# Patient Record
Sex: Male | Born: 1945 | Race: White | Hispanic: No | Smoking: Never smoker
Health system: Southern US, Community
[De-identification: ages and names within clinical notes are randomized; demographics above are authoritative.]

## PROBLEM LIST (undated history)

## (undated) DIAGNOSIS — R600 Localized edema: Secondary | ICD-10-CM

## (undated) DIAGNOSIS — E785 Hyperlipidemia, unspecified: Secondary | ICD-10-CM

## (undated) DIAGNOSIS — G4700 Insomnia, unspecified: Secondary | ICD-10-CM

## (undated) DIAGNOSIS — N1831 Chronic kidney disease, stage 3a: Secondary | ICD-10-CM

## (undated) DIAGNOSIS — I10 Essential (primary) hypertension: Secondary | ICD-10-CM

## (undated) DIAGNOSIS — N289 Disorder of kidney and ureter, unspecified: Secondary | ICD-10-CM

## (undated) DIAGNOSIS — I700 Atherosclerosis of aorta: Secondary | ICD-10-CM

## (undated) DIAGNOSIS — H269 Unspecified cataract: Secondary | ICD-10-CM

## (undated) DIAGNOSIS — D649 Anemia, unspecified: Secondary | ICD-10-CM

## (undated) DIAGNOSIS — I2510 Atherosclerotic heart disease of native coronary artery without angina pectoris: Secondary | ICD-10-CM

## (undated) DIAGNOSIS — T7840XA Allergy, unspecified, initial encounter: Secondary | ICD-10-CM

## (undated) DIAGNOSIS — G4730 Sleep apnea, unspecified: Secondary | ICD-10-CM

## (undated) DIAGNOSIS — G4733 Obstructive sleep apnea (adult) (pediatric): Secondary | ICD-10-CM

## (undated) DIAGNOSIS — Z87442 Personal history of urinary calculi: Secondary | ICD-10-CM

## (undated) DIAGNOSIS — T753XXA Motion sickness, initial encounter: Secondary | ICD-10-CM

## (undated) DIAGNOSIS — K219 Gastro-esophageal reflux disease without esophagitis: Secondary | ICD-10-CM

## (undated) DIAGNOSIS — Z9841 Cataract extraction status, right eye: Secondary | ICD-10-CM

## (undated) DIAGNOSIS — J986 Disorders of diaphragm: Secondary | ICD-10-CM

## (undated) DIAGNOSIS — J189 Pneumonia, unspecified organism: Secondary | ICD-10-CM

## (undated) DIAGNOSIS — G720 Drug-induced myopathy: Secondary | ICD-10-CM

## (undated) DIAGNOSIS — K2270 Barrett's esophagus without dysplasia: Secondary | ICD-10-CM

## (undated) DIAGNOSIS — Z7982 Long term (current) use of aspirin: Secondary | ICD-10-CM

## (undated) DIAGNOSIS — T466X5A Adverse effect of antihyperlipidemic and antiarteriosclerotic drugs, initial encounter: Secondary | ICD-10-CM

## (undated) DIAGNOSIS — I5189 Other ill-defined heart diseases: Secondary | ICD-10-CM

## (undated) DIAGNOSIS — N400 Enlarged prostate without lower urinary tract symptoms: Secondary | ICD-10-CM

## (undated) HISTORY — PX: LITHOTRIPSY: SUR834

## (undated) HISTORY — PX: CATARACT EXTRACTION W/ INTRAOCULAR LENS IMPLANT: SHX1309

## (undated) HISTORY — PX: OTHER SURGICAL HISTORY: SHX169

## (undated) HISTORY — PX: ESOPHAGOGASTRODUODENOSCOPY: SHX1529

## (undated) HISTORY — DX: Allergy, unspecified, initial encounter: T78.40XA

## (undated) HISTORY — DX: Essential (primary) hypertension: I10

## (undated) HISTORY — DX: Gastro-esophageal reflux disease without esophagitis: K21.9

## (undated) HISTORY — PX: CORONARY ARTERY BYPASS GRAFT: SHX141

## (undated) HISTORY — PX: HERNIA REPAIR: SHX51

## (undated) HISTORY — DX: Hyperlipidemia, unspecified: E78.5

## (undated) HISTORY — PX: UMBILICAL HERNIA REPAIR: SHX196

## (undated) HISTORY — PX: EYE SURGERY: SHX253

---

## 2005-07-04 ENCOUNTER — Emergency Department: Payer: Self-pay | Admitting: Emergency Medicine

## 2005-07-10 ENCOUNTER — Emergency Department: Payer: Self-pay | Admitting: Emergency Medicine

## 2005-07-14 ENCOUNTER — Emergency Department: Payer: Self-pay | Admitting: Unknown Physician Specialty

## 2007-08-31 ENCOUNTER — Ambulatory Visit: Payer: Self-pay | Admitting: Unknown Physician Specialty

## 2007-08-31 HISTORY — PX: COLONOSCOPY: SHX174

## 2009-10-11 ENCOUNTER — Ambulatory Visit: Payer: Self-pay | Admitting: Family Medicine

## 2011-11-24 HISTORY — PX: COLONOSCOPY: SHX174

## 2011-11-24 HISTORY — PX: UPPER GI ENDOSCOPY: SHX6162

## 2012-01-01 ENCOUNTER — Ambulatory Visit: Payer: Self-pay | Admitting: Ophthalmology

## 2012-01-01 LAB — CBC WITH DIFFERENTIAL/PLATELET
Basophil #: 0.1 10*3/uL (ref 0.0–0.1)
Eosinophil #: 0.2 10*3/uL (ref 0.0–0.7)
Lymphocyte %: 27.9 %
MCH: 31.4 pg (ref 26.0–34.0)
MCHC: 33.5 g/dL (ref 32.0–36.0)
MCV: 94 fL (ref 80–100)
Neutrophil #: 4.7 10*3/uL (ref 1.4–6.5)
Neutrophil %: 61.7 %
Platelet: 259 10*3/uL (ref 150–440)
RBC: 4.93 10*6/uL (ref 4.40–5.90)
RDW: 12.4 % (ref 11.5–14.5)

## 2012-01-01 LAB — POTASSIUM: Potassium: 4 mmol/L (ref 3.5–5.1)

## 2012-01-11 ENCOUNTER — Ambulatory Visit: Payer: Self-pay | Admitting: Ophthalmology

## 2012-07-18 ENCOUNTER — Ambulatory Visit: Payer: Self-pay | Admitting: Family Medicine

## 2013-01-10 ENCOUNTER — Ambulatory Visit: Payer: Self-pay | Admitting: Unknown Physician Specialty

## 2013-01-11 LAB — PATHOLOGY REPORT

## 2014-04-17 ENCOUNTER — Ambulatory Visit: Payer: Self-pay | Admitting: Urology

## 2014-04-24 ENCOUNTER — Ambulatory Visit: Payer: Self-pay | Admitting: Urology

## 2014-04-26 ENCOUNTER — Ambulatory Visit: Payer: Self-pay | Admitting: Urology

## 2014-05-14 ENCOUNTER — Ambulatory Visit: Payer: Self-pay | Admitting: Urology

## 2014-05-15 DIAGNOSIS — N200 Calculus of kidney: Secondary | ICD-10-CM | POA: Insufficient documentation

## 2015-03-17 NOTE — Op Note (Signed)
PATIENT NAME:  Timothy Hatfield, Timothy Hatfield MR#:  [id number] DATE OF BIRTH:  1946-07-30  DATE OF PROCEDURE:  01/11/2012  PREOPERATIVE DIAGNOSIS:  Cataract, right eye.   POSTOPERATIVE DIAGNOSIS:  Cataract, right eye.  PROCEDURE PERFORMED:  Extracapsular cataract extraction using phacoemulsification with placement of an Alcon [id number], 19.5-diopter posterior chamber lens, serial # [id number].  SURGEON:  Loura Back. Raheel Kunkle, MD  ASSISTANT:  None.  ANESTHESIA:  4% lidocaine and 0.75% Marcaine in a 50/50 mixture with 10 units/mL of Hylenex added, given as a peribulbar.   ANESTHESIOLOGIST:  Dr. Marcello Moores  COMPLICATIONS:  None.  ESTIMATED BLOOD LOSS:  Less than 1 ml.  DESCRIPTION OF PROCEDURE:  The patient was brought to the operating room and given a peribulbar block.  The patient was then prepped and draped in the usual fashion.  The vertical rectus muscles were imbricated using 5-0 silk sutures.  These sutures were then clamped to the sterile drapes as bridle sutures.  A limbal peritomy was performed extending two clock hours and hemostasis was obtained with cautery.  A partial thickness scleral groove was made at the surgical limbus and dissected anteriorly in a lamellar dissection using an Alcon crescent knife.  The anterior chamber was entered superonasally with a Superblade and through the lamellar dissection with a 2.6 mm keratome.  DisCoVisc was used to replace the aqueous and a continuous tear capsulorrhexis was carried out.  Hydrodissection and hydrodelineation were carried out with balanced salt and a 27 gauge canula.  The nucleus was rotated to confirm the effectiveness of the hydrodissection.  Phacoemulsification was carried out using a divide-and-conquer technique.  Total ultrasound time was 1 minute and 36 seconds with an average power of 16.6 percent.  Irrigation/aspiration was used to remove the residual cortex.  DisCoVisc was used to inflate the capsule and the internal incision was  enlarged to 3 mm with the crescent knife.  The intraocular lens was folded and inserted into the capsular bag using the AcrySert delivery system.  Irrigation/aspiration was used to remove the residual DisCoVisc.  Miostat was injected into the anterior chamber through the paracentesis track to inflate the anterior chamber and induce miosis.  The wound was checked for leaks and wound leakage was found.  A single 10-0 suture was placed across the incision, tied and the knot was rotated superiorly.  The conjunctiva was closed with cautery and the bridle sutures were removed.  Two drops of 0.3% Vigamox were placed on the eye.   An eye shield was placed on the eye.  The patient was discharged to the recovery room in good condition. ____________________________ Loura Back Michalla Ringer, MD sad:slb D: 01/11/2012 13:36:23 ET T: 01/11/2012 14:07:57 ET JOB#: [id number]  cc: Remo Lipps A. Chaynce Schafer, MD, <Dictator> Martie Lee MD ELECTRONICALLY SIGNED 01/15/2012 16:24

## 2015-05-08 ENCOUNTER — Ambulatory Visit: Payer: Self-pay | Admitting: Family Medicine

## 2015-05-09 ENCOUNTER — Ambulatory Visit
Admission: RE | Admit: 2015-05-09 | Discharge: 2015-05-09 | Disposition: A | Discharge status: Home / Self Care | Payer: Medicare PPO | Source: Ambulatory Visit | Attending: Family Medicine | Admitting: Family Medicine

## 2015-05-09 ENCOUNTER — Encounter: Payer: Self-pay | Admitting: Family Medicine

## 2015-05-09 ENCOUNTER — Ambulatory Visit (INDEPENDENT_AMBULATORY_CARE_PROVIDER_SITE_OTHER): Payer: Medicare PPO | Admitting: Family Medicine

## 2015-05-09 VITALS — BP 130/70 | HR 72 | Ht 67.0 in | Wt 196.0 lb

## 2015-05-09 DIAGNOSIS — M47817 Spondylosis without myelopathy or radiculopathy, lumbosacral region: Secondary | ICD-10-CM | POA: Diagnosis not present

## 2015-05-09 DIAGNOSIS — M545 Low back pain: Secondary | ICD-10-CM | POA: Diagnosis not present

## 2015-05-09 DIAGNOSIS — N200 Calculus of kidney: Secondary | ICD-10-CM | POA: Insufficient documentation

## 2015-05-09 DIAGNOSIS — M5136 Other intervertebral disc degeneration, lumbar region: Secondary | ICD-10-CM | POA: Diagnosis not present

## 2015-05-09 DIAGNOSIS — M47816 Spondylosis without myelopathy or radiculopathy, lumbar region: Secondary | ICD-10-CM | POA: Diagnosis not present

## 2015-05-09 DIAGNOSIS — K219 Gastro-esophageal reflux disease without esophagitis: Secondary | ICD-10-CM

## 2015-05-09 DIAGNOSIS — M5137 Other intervertebral disc degeneration, lumbosacral region: Secondary | ICD-10-CM | POA: Diagnosis not present

## 2015-05-09 MED ORDER — ETODOLAC 500 MG PO TABS: 500.0000 mg | ORAL_TABLET | Freq: Two times a day (BID) | ORAL | Status: DC

## 2015-05-09 MED ORDER — PANTOPRAZOLE SODIUM 40 MG PO TBEC: 40.0000 mg | DELAYED_RELEASE_TABLET | Freq: Every day | ORAL | Status: DC

## 2015-05-09 MED ORDER — BENAZEPRIL-HYDROCHLOROTHIAZIDE 10-12.5 MG PO TABS: 1.0000 | ORAL_TABLET | Freq: Every day | ORAL | Status: DC

## 2015-05-09 NOTE — Progress Notes (Signed)
Name: Timothy Hatfield   MRN: [id number]    DOB: 1946/11/08   Date:05/09/2015       Progress Note  Subjective  Chief Complaint  Chief Complaint  Patient presents with  . Back Pain    chronic- going to chiropractor- no relief now    Back Pain This is a recurrent problem. The current episode started more than 1 year ago (twisted while fishing). The problem occurs daily. The pain is present in the gluteal. The quality of the pain is described as aching. The pain is at a severity of 5/10. The pain is mild. The symptoms are aggravated by bending and sitting. Stiffness is present in the morning. Pertinent negatives include no abdominal pain, chest pain, dysuria, fever, headaches, tingling or weight loss. He has tried NSAIDs for the symptoms. The treatment provided mild relief.    No problem-specific assessment & plan notes found for this encounter.   Past Medical History  Diagnosis Date  . Hypertension   . Hyperlipidemia   . Allergy     Past Surgical History  Procedure Laterality Date  . Hernia repair    . Kidney stones      lithotripsy    History reviewed. No pertinent family history.  History   Social History  . Marital Status: Married    Spouse Name: N/A  . Number of Children: N/A  . Years of Education: N/A   Occupational History  . Not on file.   Social History Main Topics  . Smoking status: Never Smoker   . Smokeless tobacco: Not on file  . Alcohol Use: No  . Drug Use: No  . Sexual Activity: Not on file   Other Topics Concern  . Not on file   Social History Narrative  . No narrative on file    No Known Allergies   Review of Systems  Constitutional: Negative for fever, chills, weight loss and malaise/fatigue.  HENT: Negative for ear discharge, ear pain and sore throat.   Eyes: Negative for blurred vision.  Respiratory: Negative for cough, sputum production, shortness of breath and wheezing.   Cardiovascular: Negative for chest pain, palpitations and  leg swelling.  Gastrointestinal: Negative for heartburn, nausea, abdominal pain, diarrhea, constipation, blood in stool and melena.  Genitourinary: Negative for dysuria, urgency, frequency and hematuria.  Musculoskeletal: Negative for myalgias, back pain, joint pain and neck pain.  Skin: Negative for rash.  Neurological: Negative for dizziness, tingling, sensory change, focal weakness and headaches.  Endo/Heme/Allergies: Negative for environmental allergies and polydipsia. Does not bruise/bleed easily.  Psychiatric/Behavioral: Negative for depression and suicidal ideas. The patient is not nervous/anxious and does not have insomnia.      Objective  Filed Vitals:   05/09/15 0933  BP: 130/70  Pulse: 72  Height: 5\' 7"  (1.702 m)  Weight: 196 lb (88.905 kg)    Physical Exam  Constitutional: He is oriented to person, place, and time and well-developed, well-nourished, and in no distress.  HENT:  Head: Normocephalic.  Right Ear: External ear normal.  Left Ear: External ear normal.  Nose: Nose normal.  Mouth/Throat: Oropharynx is clear and moist.  Eyes: Conjunctivae and EOM are normal. Pupils are equal, round, and reactive to light. Right eye exhibits no discharge. Left eye exhibits no discharge. No scleral icterus.  Neck: Normal range of motion. Neck supple. No JVD present. No tracheal deviation present. No thyromegaly present.  Cardiovascular: Normal rate, regular rhythm, normal heart sounds and intact distal pulses.  Exam reveals no gallop  and no friction rub.   No murmur heard. Pulmonary/Chest: Breath sounds normal. No respiratory distress. He has no wheezes. He has no rales.  Abdominal: Soft. Bowel sounds are normal. He exhibits no mass. There is no hepatosplenomegaly. There is no tenderness. There is no rebound, no guarding and no CVA tenderness.  Musculoskeletal: Normal range of motion. He exhibits no edema or tenderness.  Lymphadenopathy:    He has no cervical adenopathy.   Neurological: He is alert and oriented to person, place, and time. He has normal sensation, normal strength, normal reflexes and intact cranial nerves. No cranial nerve deficit.  Skin: Skin is warm. No rash noted.  Psychiatric: Mood and affect normal.      No results found for this or any previous visit (from the past 2160 hour(s)).   Assessment & Plan  Problem List Items Addressed This Visit    None    Visit Diagnoses    Left low back pain, with sciatica presence unspecified    -  Primary    Relevant Medications    etodolac (LODINE) 500 MG tablet    Other Relevant Orders    Ambulatory referral to Orthopedic Surgery    DG Lumbar Spine Complete    GERD without esophagitis        Relevant Medications    pantoprazole (PROTONIX) 40 MG tablet         Dr. Deanna Jones [facility]  05/09/2015

## 2015-05-09 NOTE — Progress Notes (Addendum)
Name: Timothy Hatfield   MRN: [id number]    DOB: [birth year]   Date:05/09/2015       Progress Note  Subjective  Chief Complaint  Chief Complaint  Patient presents with  . Back Pain    chronic- going to chiropractor- no relief now    Back Pain This is a recurrent problem. The current episode started more than 1 year ago. The problem occurs daily. The problem is unchanged. The pain is present in the lumbar spine and sacro-iliac. The quality of the pain is described as aching. The pain is at a severity of 5/10. The pain is moderate. The pain is worse during the day (esp in AM). The symptoms are aggravated by bending, twisting and sitting. Stiffness is present in the morning. Pertinent negatives include no abdominal pain, bladder incontinence, bowel incontinence, headaches, leg pain, numbness, paresis, paresthesias, perianal numbness, tingling or weakness. He has tried NSAIDs for the symptoms. The treatment provided mild relief.    No problem-specific assessment & plan notes found for this encounter.   Past Medical History  Diagnosis Date  . Hypertension   . Hyperlipidemia   . Allergy     Past Surgical History  Procedure Laterality Date  . Hernia repair    . Kidney stones      lithotripsy    History reviewed. No pertinent family history.  History   Social History  . Marital Status: Married    Spouse Name: N/A  . Number of Children: N/A  . Years of Education: N/A   Occupational History  . Not on file.   Social History Main Topics  . Smoking status: Never Smoker   . Smokeless tobacco: Not on file  . Alcohol Use: No  . Drug Use: No  . Sexual Activity: Not on file   Other Topics Concern  . Not on file   Social History Narrative  . No narrative on file    No Known Allergies   Review of Systems  Gastrointestinal: Negative for abdominal pain and bowel incontinence.  Genitourinary: Negative for bladder incontinence.  Musculoskeletal: Positive for back pain.   Neurological: Negative for tingling, weakness, numbness, headaches and paresthesias.     Objective  Filed Vitals:   05/09/15 0933  BP: 130/70  Pulse: 72  Height: 5\' 7"  (1.702 m)  Weight: 196 lb (88.905 kg)    Physical Exam    No results found for this or any previous visit (from the past 2160 hour(s)).   Assessment & Plan  Problem List Items Addressed This Visit    None    Visit Diagnoses    Left low back pain, with sciatica presence unspecified    -  Primary    Relevant Medications    etodolac (LODINE) 500 MG tablet    Other Relevant Orders    Ambulatory referral to Orthopedic Surgery    DG Lumbar Spine Complete    GERD without esophagitis        Relevant Medications    pantoprazole (PROTONIX) 40 MG tablet         Dr. Tavin Vernet [facility]  05/09/2015

## 2015-05-16 DIAGNOSIS — M47816 Spondylosis without myelopathy or radiculopathy, lumbar region: Secondary | ICD-10-CM | POA: Diagnosis not present

## 2015-05-16 DIAGNOSIS — M545 Low back pain: Secondary | ICD-10-CM | POA: Diagnosis not present

## 2015-05-29 DIAGNOSIS — M47817 Spondylosis without myelopathy or radiculopathy, lumbosacral region: Secondary | ICD-10-CM | POA: Diagnosis not present

## 2015-05-29 DIAGNOSIS — M5136 Other intervertebral disc degeneration, lumbar region: Secondary | ICD-10-CM | POA: Diagnosis not present

## 2015-05-29 DIAGNOSIS — M47816 Spondylosis without myelopathy or radiculopathy, lumbar region: Secondary | ICD-10-CM | POA: Diagnosis not present

## 2015-06-05 ENCOUNTER — Encounter: Payer: Self-pay | Admitting: Emergency Medicine

## 2015-06-05 ENCOUNTER — Emergency Department
Admission: EM | Admit: 2015-06-05 | Discharge: 2015-06-05 | Disposition: A | Discharge status: Home / Self Care | Payer: Medicare PPO | Attending: Emergency Medicine | Admitting: Emergency Medicine

## 2015-06-05 DIAGNOSIS — Z791 Long term (current) use of non-steroidal anti-inflammatories (NSAID): Secondary | ICD-10-CM | POA: Diagnosis not present

## 2015-06-05 DIAGNOSIS — I10 Essential (primary) hypertension: Secondary | ICD-10-CM | POA: Diagnosis not present

## 2015-06-05 DIAGNOSIS — Z79899 Other long term (current) drug therapy: Secondary | ICD-10-CM | POA: Insufficient documentation

## 2015-06-05 DIAGNOSIS — Y998 Other external cause status: Secondary | ICD-10-CM | POA: Diagnosis not present

## 2015-06-05 DIAGNOSIS — Y9289 Other specified places as the place of occurrence of the external cause: Secondary | ICD-10-CM | POA: Insufficient documentation

## 2015-06-05 DIAGNOSIS — T63441A Toxic effect of venom of bees, accidental (unintentional), initial encounter: Secondary | ICD-10-CM | POA: Diagnosis not present

## 2015-06-05 DIAGNOSIS — T7840XA Allergy, unspecified, initial encounter: Secondary | ICD-10-CM | POA: Diagnosis not present

## 2015-06-05 DIAGNOSIS — R112 Nausea with vomiting, unspecified: Secondary | ICD-10-CM | POA: Diagnosis not present

## 2015-06-05 DIAGNOSIS — Y9389 Activity, other specified: Secondary | ICD-10-CM | POA: Insufficient documentation

## 2015-06-05 DIAGNOSIS — L500 Allergic urticaria: Secondary | ICD-10-CM | POA: Diagnosis present

## 2015-06-05 MED ORDER — DIPHENHYDRAMINE HCL 50 MG/ML IJ SOLN
INTRAMUSCULAR | Status: AC
  Administered 2015-06-05: 50 mg via INTRAVENOUS
  Filled 2015-06-05: qty 1

## 2015-06-05 MED ORDER — DIPHENHYDRAMINE HCL 50 MG/ML IJ SOLN
50.0000 mg | Freq: Once | INTRAMUSCULAR | Status: AC
  Administered 2015-06-05: 50 mg via INTRAVENOUS

## 2015-06-05 MED ORDER — FAMOTIDINE IN NACL 20-0.9 MG/50ML-% IV SOLN
20.0000 mg | Freq: Once | INTRAVENOUS | Status: AC
  Administered 2015-06-05: 20 mg via INTRAVENOUS
  Filled 2015-06-05: qty 50

## 2015-06-05 MED ORDER — PREDNISONE 20 MG PO TABS: 40.0000 mg | ORAL_TABLET | Freq: Every day | ORAL | Status: DC

## 2015-06-05 MED ORDER — EPINEPHRINE 0.3 MG/0.3ML IJ SOAJ: 0.3000 mg | Freq: Once | INTRAMUSCULAR | Status: DC

## 2015-06-05 NOTE — Discharge Instructions (Signed)
Anaphylactic Reaction °An anaphylactic reaction is a sudden, severe allergic reaction that involves the whole body. It can be life threatening. A hospital stay is often required. People with asthma, eczema, or hay fever are slightly more likely to have an anaphylactic reaction. °CAUSES  °An anaphylactic reaction may be caused by anything to which you are allergic. After being exposed to the allergic substance, your immune system becomes sensitized to it. When you are exposed to that allergic substance again, an allergic reaction can occur. Common causes of an anaphylactic reaction include: °· Medicines. °· Foods, especially peanuts, wheat, shellfish, milk, and eggs. °· Insect bites or stings. °· Blood products. °· Chemicals, such as dyes, latex, and contrast material used for imaging tests. °SYMPTOMS  °When an allergic reaction occurs, the body releases histamine and other substances. These substances cause symptoms such as tightening of the airway. Symptoms often develop within seconds or minutes of exposure. Symptoms may include: °· Skin rash or hives. °· Itching. °· Chest tightness. °· Swelling of the eyes, tongue, or lips. °· Trouble breathing or swallowing. °· Lightheadedness or fainting. °· Anxiety or confusion. °· Stomach pains, vomiting, or diarrhea. °· Nasal congestion. °· A fast or irregular heartbeat (palpitations). °DIAGNOSIS  °Diagnosis is based on your history of recent exposure to allergic substances, your symptoms, and a physical exam. Your caregiver may also perform blood or urine tests to confirm the diagnosis. °TREATMENT  °Epinephrine medicine is the main treatment for an anaphylactic reaction. Other medicines that may be used for treatment include antihistamines, steroids, and albuterol. In severe cases, fluids and medicine to support blood pressure may be given through an intravenous line (IV). Even if you improve after treatment, you need to be observed to make sure your condition does not get  worse. This may require a stay in the hospital. °HOME CARE INSTRUCTIONS  °· Wear a medical alert bracelet or necklace stating your allergy. °· You and your family must learn how to use an anaphylaxis kit or give an epinephrine injection to temporarily treat an emergency allergic reaction. Always carry your epinephrine injection or anaphylaxis kit with you. This can be lifesaving if you have a severe reaction. °· Do not drive or perform tasks after treatment until the medicines used to treat your reaction have worn off, or until your caregiver says it is okay. °· If you have hives or a rash: °¨ Take medicines as directed by your caregiver. °¨ You may use an over-the-counter antihistamine (diphenhydramine) as needed. °¨ Apply cold compresses to the skin or take baths in cool water. Avoid hot baths or showers. °SEEK MEDICAL CARE IF:  °· You develop symptoms of an allergic reaction to a new substance. Symptoms may start right away or minutes later. °· You develop a rash, hives, or itching. °· You develop new symptoms. °SEEK IMMEDIATE MEDICAL CARE IF:  °· You have swelling of the mouth, difficulty breathing, or wheezing. °· You have a tight feeling in your chest or throat. °· You develop hives, swelling, or itching all over your body. °· You develop severe vomiting or diarrhea. °· You feel faint or pass out. °This is an emergency. Use your epinephrine injection or anaphylaxis kit as you have been instructed. Call your local emergency services (911 in [place] Even if you improve after the injection, you need to be examined at a hospital emergency department. °MAKE SURE YOU:  °· Understand these instructions. °· Will watch your condition. °· Will get help right away if you are not   doing well or get worse. Document Released: 11/09/2005 Document Revised: 11/14/2013 Document Reviewed: 02/10/2012 [facility] Patient Information 2015 [place], [place]. This information is not intended to replace advice given to you by your health  care provider. Make sure you discuss any questions you have with your health care provider.    As we have discussed please take Benadryl 50 mg (2 regular strength tablets) every 6 hours as needed for itching or hives. Please take your steroid once a day for the next 5 days. We have also been prescribed EpiPen's. Please keep these near your person at all times. As we have discussed please use an EpiPen if you have any shortness of breath, swelling of your mouth, lips, tongue, diffuse sweating, nausea, or you feel like you will pass out. Any time he use an EpiPen you must call 911 immediately for transport to the emergency department for further treatment and monitoring.

## 2015-06-05 NOTE — ED Provider Notes (Signed)
[facility] Emergency Department Provider Note  Time seen: 3:25 PM  I have reviewed the triage vital signs and the nursing notes.   HISTORY  Chief Complaint Allergic Reaction and Urticaria    HPI Timothy Hatfield is a 69 y.o. male with a past medical history of hypertension, hyperlipidemia who presents the emergency department after likely allergic reaction. According to the patient was working outside and chart when he sustained 6-7 yellowjacket stings. Patient became acutely diaphoretic, lost consciousness briefly with jerking movements per son. Patient awoke vomited several times, was very diaphoretic per the son. EMS was called. Upon EMS arrival patient was alert and oriented, but with a blood pressure of 85 systolic. Patient was given fluids, Zofran, and 125 mg of Solu-Medrol by EMS. Patient states he feels much much better at this time. Denies any trouble breathing now or at any time. Denies any chest pain. No longer feels nauseated. Symptom onset approximately 1.5 hours ago.     Past Medical History  Diagnosis Date  . Hypertension   . Hyperlipidemia   . Allergy     There are no active problems to display for this patient.   Past Surgical History  Procedure Laterality Date  . Hernia repair    . Kidney stones      lithotripsy    Current Outpatient Rx  Name  Route  Sig  Dispense  Refill  . benazepril-hydrochlorthiazide (LOTENSIN HCT) 10-12.5 MG per tablet   Oral   Take 1 tablet by mouth daily.   30 tablet   5   . Dutasteride-Tamsulosin HCl (JALYN) 0.5-0.4 MG CAPS   Oral   Take 1 capsule by mouth daily.         Marland Kitchen etodolac (LODINE) 500 MG tablet   Oral   Take 1 tablet (500 mg total) by mouth 2 (two) times daily.   60 tablet   11   . mometasone (NASONEX) 50 MCG/ACT nasal spray   Nasal   Place 2 sprays into the nose daily.         . pantoprazole (PROTONIX) 40 MG tablet   Oral   Take 1 tablet (40 mg total) by mouth daily.   30  tablet   5     Allergies Review of patient's allergies indicates no known allergies.  History reviewed. No pertinent family history.  Social History History  Substance Use Topics  . Smoking status: Never Smoker   . Smokeless tobacco: Not on file  . Alcohol Use: No    Review of Systems Constitutional: Negative for fever. Eyes: Negative for visual changes. ENT: No swelling. Cardiovascular: Negative for chest pain. Respiratory: Negative for shortness of breath. Gastrointestinal: Negative for abdominal pain. Positive for nausea and vomiting which is now resolved. Musculoskeletal: Negative for back pain. Skin: Positive for diffuse rash and itching. Neurological: Negative for headache 10-point ROS otherwise negative.  ____________________________________________   PHYSICAL EXAM:  VITAL SIGNS: ED Triage Vitals  Enc Vitals Group     BP 06/05/15 1429 124/65 mmHg     Pulse Rate 06/05/15 1429 88     Resp 06/05/15 1429 20     Temp 06/05/15 1429 98 F (36.7 C)     Temp Source 06/05/15 1429 Oral     SpO2 06/05/15 1429 95 %     Weight 06/05/15 1429 190 lb (86.183 kg)     Height 06/05/15 1429 5\' 7"  (1.702 m)     Head Cir --      Peak  Flow --      Pain Score 06/05/15 1430 9     Pain Loc --      Pain Edu? --      Excl. in [place]? --     Constitutional: Alert and oriented. Well appearing and in no distress. Eyes: Normal exam ENT   Head: Normocephalic and atraumatic   Mouth/Throat: Mucous membranes are moist. No oral swelling noted. Cardiovascular: Normal rate, regular rhythm. No murmurs, rubs, or gallops. Respiratory: Normal respiratory effort without tachypnea nor retractions. Breath sounds are clear and equal bilaterally. No wheezes/rales/rhonchi. Gastrointestinal: Soft and nontender. No distention.   Musculoskeletal: Nontender with normal range of motion in all extremities. No lower extremity tenderness or edema. Neurologic:  Normal speech and language. No gross  focal neurologic deficits  Skin:  Skin is warm, continues to have urticaria over his chest, and bilateral upper extremities. Psychiatric: Mood and affect are normal. Speech and behavior are normal.   ____________________________________________   INITIAL IMPRESSION / ASSESSMENT AND PLAN / ED COURSE  Pertinent labs & imaging results that were available during my care of the patient were reviewed by me and considered in my medical decision making (see chart for details).  Patient with anaphylactic allergic reaction following yellowjacket stings today. We will dose 50 mg of Benadryl, 20 mg of Pepcid, and monitor very closely in the emergency department. Currently the patient has a normal blood pressure, denies any symptoms at this time besides itching. Patient received Solu-Medrol and Zofran prior to arrival. No wheeze or trouble breathing or any signs of edema at this time. I discussed with the patient 4-6 hour observation in the emergency department. We will discharge the patient home on 5 days of steroid to help prevent rebound reaction. I have also discussed with the patient EpiPen use, the patient will be prescribed to EpiPen's as well.   ----------------------------------------- 5:36 PM on 06/05/2015 -----------------------------------------  Patient appears very well, free of any hives. No symptoms whatsoever at this time. We will discharge home with Benadryl as needed, 5 days of steroid, and EpiPen's. Patient agreeable to plan. Discussed at length Pattie use an EpiPen, and also return precautions. ____________________________________________   FINAL CLINICAL IMPRESSION(S) / ED DIAGNOSES  Anaphylactic reaction   Harvest Dark, MD 06/05/15 1736

## 2015-06-05 NOTE — ED Notes (Signed)
Patient's rash and itching has resolved. Stable at discharge

## 2015-06-05 NOTE — ED Notes (Signed)
Patient was working in his yard when he sustained 6-7 yellow jacket stings. Patient became diaphoretic and lost consciousness. EMS reported Systolic BP of 85 on scene. Patient was given 1273ml NS, 125mg  Solumedrol, and 4mg  Zofran. Patient arrives to [facility] ED diaphoretic but A+Ox4. Patient states that his legs are burning. EMS reports a significant increase in hives and erythema.

## 2015-06-18 ENCOUNTER — Encounter: Payer: Self-pay | Admitting: Family Medicine

## 2015-06-18 ENCOUNTER — Ambulatory Visit (INDEPENDENT_AMBULATORY_CARE_PROVIDER_SITE_OTHER): Payer: Medicare PPO | Admitting: Family Medicine

## 2015-06-18 VITALS — BP 124/64 | HR 68 | Ht 67.0 in | Wt 193.0 lb

## 2015-06-18 DIAGNOSIS — K2270 Barrett's esophagus without dysplasia: Secondary | ICD-10-CM | POA: Diagnosis not present

## 2015-06-18 DIAGNOSIS — M1990 Unspecified osteoarthritis, unspecified site: Secondary | ICD-10-CM | POA: Diagnosis not present

## 2015-06-18 DIAGNOSIS — K529 Noninfective gastroenteritis and colitis, unspecified: Secondary | ICD-10-CM

## 2015-06-18 DIAGNOSIS — K644 Residual hemorrhoidal skin tags: Secondary | ICD-10-CM | POA: Insufficient documentation

## 2015-06-18 DIAGNOSIS — N400 Enlarged prostate without lower urinary tract symptoms: Secondary | ICD-10-CM | POA: Insufficient documentation

## 2015-06-18 DIAGNOSIS — J309 Allergic rhinitis, unspecified: Secondary | ICD-10-CM | POA: Insufficient documentation

## 2015-06-18 DIAGNOSIS — M779 Enthesopathy, unspecified: Secondary | ICD-10-CM | POA: Insufficient documentation

## 2015-06-18 DIAGNOSIS — I10 Essential (primary) hypertension: Secondary | ICD-10-CM | POA: Diagnosis not present

## 2015-06-18 DIAGNOSIS — K219 Gastro-esophageal reflux disease without esophagitis: Secondary | ICD-10-CM

## 2015-06-18 DIAGNOSIS — M545 Low back pain: Secondary | ICD-10-CM

## 2015-06-18 DIAGNOSIS — E785 Hyperlipidemia, unspecified: Secondary | ICD-10-CM | POA: Insufficient documentation

## 2015-06-18 LAB — POCT URINALYSIS DIPSTICK
BILIRUBIN UA: POSITIVE
Blood, UA: NEGATIVE
Glucose, UA: NEGATIVE
Ketones, UA: NEGATIVE
LEUKOCYTES UA: NEGATIVE
NITRITE UA: NEGATIVE
Protein, UA: NEGATIVE
Spec Grav, UA: 1.02
Urobilinogen, UA: 0.2
pH, UA: 6.5

## 2015-06-18 LAB — HEMOCCULT GUIAC POC 1CARD (OFFICE): FECAL OCCULT BLD: NEGATIVE

## 2015-06-18 MED ORDER — DOXYCYCLINE HYCLATE 100 MG PO TABS
100.0000 mg | ORAL_TABLET | Freq: Two times a day (BID) | ORAL | Status: DC
Start: 2015-06-18 — End: 2016-02-11

## 2015-06-18 MED ORDER — BENAZEPRIL-HYDROCHLOROTHIAZIDE 10-12.5 MG PO TABS: 1.0000 | ORAL_TABLET | Freq: Every day | ORAL | Status: DC

## 2015-06-18 MED ORDER — ETODOLAC 500 MG PO TABS: 500.0000 mg | ORAL_TABLET | Freq: Two times a day (BID) | ORAL | Status: DC

## 2015-06-18 MED ORDER — PANTOPRAZOLE SODIUM 40 MG PO TBEC: 40.0000 mg | DELAYED_RELEASE_TABLET | Freq: Every day | ORAL | Status: DC

## 2015-06-18 NOTE — Progress Notes (Signed)
Name: Timothy Hatfield   MRN: [id number]    DOB: 1946/03/29   Date:06/18/2015       Progress Note  Subjective  Chief Complaint  Chief Complaint  Patient presents with  . Hypertension  . Gastrophageal Reflux  . Joint Swelling    takes Etodolac  . Dizziness    has had some diarrhea with dizziness    Hypertension This is a recurrent problem. The current episode started more than 1 year ago. The problem has been gradually improving since onset. The problem is controlled. Pertinent negatives include no anxiety, blurred vision, chest pain, headaches, malaise/fatigue, neck pain, orthopnea, palpitations, peripheral edema, PND, shortness of breath or sweats. There are no associated agents to hypertension. Risk factors for coronary artery disease include male gender and obesity. Past treatments include ACE inhibitors and diuretics. The current treatment provides moderate improvement. There are no compliance problems.  There is no history of angina, kidney disease, CAD/MI, CVA, heart failure, left ventricular hypertrophy, PVD or retinopathy. There is no history of chronic renal disease.  Gastrophageal Reflux He reports no abdominal pain, no belching, no chest pain, no choking, no coughing, no dysphagia, no early satiety, no globus sensation, no heartburn, no hoarse voice, no nausea, no sore throat, no stridor, no tooth decay, no water brash or no wheezing. This is a chronic problem. The current episode started more than 1 year ago. The problem occurs occasionally. The problem has been gradually improving. Nothing aggravates the symptoms. Pertinent negatives include no anemia, fatigue, melena, muscle weakness, orthopnea or weight loss. He has tried a PPI for the symptoms. The treatment provided mild relief. Past procedures do not include an abdominal ultrasound.  Dizziness This is a recurrent problem. The current episode started yesterday. The problem occurs intermittently. Pertinent negatives include no  abdominal pain, anorexia, arthralgias, change in bowel habit, chest pain, chills, congestion, coughing, diaphoresis, fatigue, fever, headaches, myalgias, nausea, neck pain, rash, sore throat, urinary symptoms or visual change. He has tried nothing for the symptoms.    No problem-specific assessment & plan notes found for this encounter.   Past Medical History  Diagnosis Date  . Hypertension   . Hyperlipidemia   . Allergy     Past Surgical History  Procedure Laterality Date  . Hernia repair    . Kidney stones      lithotripsy  . Colonoscopy  2013    normal/ Dr Vira Agar- cleared for 10 yrs  . Upper gi endoscopy  2013    No family history on file.  History   Social History  . Marital Status: Married    Spouse Name: N/A  . Number of Children: N/A  . Years of Education: N/A   Occupational History  . Not on file.   Social History Main Topics  . Smoking status: Never Smoker   . Smokeless tobacco: Not on file  . Alcohol Use: No  . Drug Use: No  . Sexual Activity: Not on file   Other Topics Concern  . Not on file   Social History Narrative    Allergies  Allergen Reactions  . Bee Venom     Has epi- pen on hand     Review of Systems  Constitutional: Negative for fever, chills, weight loss, malaise/fatigue, diaphoresis and fatigue.  HENT: Negative for congestion, ear discharge, ear pain, hoarse voice and sore throat.   Eyes: Negative for blurred vision.  Respiratory: Negative for cough, sputum production, choking, shortness of breath and wheezing.   Cardiovascular:  Negative for chest pain, palpitations, orthopnea, leg swelling and PND.  Gastrointestinal: Negative for heartburn, dysphagia, nausea, abdominal pain, diarrhea, constipation, blood in stool, melena, anorexia and change in bowel habit.  Genitourinary: Negative for dysuria, urgency, frequency and hematuria.  Musculoskeletal: Negative for myalgias, back pain, joint pain, arthralgias, muscle weakness and neck  pain.  Skin: Negative for rash.  Neurological: Positive for dizziness. Negative for tingling, sensory change, focal weakness and headaches.  Endo/Heme/Allergies: Negative for environmental allergies and polydipsia. Does not bruise/bleed easily.  Psychiatric/Behavioral: Negative for depression and suicidal ideas. The patient is not nervous/anxious and does not have insomnia.      Objective  Filed Vitals:   06/18/15 1458  BP: 124/64  Pulse: 68  Height: 5\' 7"  (1.702 m)  Weight: 193 lb (87.544 kg)    Physical Exam  Constitutional: He is oriented to person, place, and time and well-developed, well-nourished, and in no distress.  HENT:  Head: Normocephalic.  Right Ear: External ear normal.  Left Ear: External ear normal.  Nose: Nose normal.  Mouth/Throat: Oropharynx is clear and moist.  Eyes: Conjunctivae and EOM are normal. Pupils are equal, round, and reactive to light. Right eye exhibits no discharge. Left eye exhibits no discharge. No scleral icterus.  Neck: Normal range of motion. Neck supple. No JVD present. No tracheal deviation present. No thyromegaly present.  Cardiovascular: Normal rate, regular rhythm, normal heart sounds and intact distal pulses.  Exam reveals no gallop and no friction rub.   No murmur heard. Pulmonary/Chest: Breath sounds normal. No respiratory distress. He has no wheezes. He has no rales.  Abdominal: Soft. Bowel sounds are normal. He exhibits no mass. There is no hepatosplenomegaly. There is no tenderness. There is no rebound, no guarding and no CVA tenderness.  Musculoskeletal: Normal range of motion. He exhibits no edema or tenderness.  Lymphadenopathy:    He has no cervical adenopathy.  Neurological: He is alert and oriented to person, place, and time. He has normal sensation, normal strength, normal reflexes and intact cranial nerves. No cranial nerve deficit.  Skin: Skin is warm. No rash noted.  Psychiatric: Mood and affect normal.  Nursing note and  vitals reviewed.     Assessment & Plan  Problem List Items Addressed This Visit      Cardiovascular and Mediastinum   Essential (primary) hypertension - Primary   Relevant Medications   benazepril-hydrochlorthiazide (LOTENSIN HCT) 10-12.5 MG per tablet     Digestive   Barrett esophagus   Relevant Medications   pantoprazole (PROTONIX) 40 MG tablet   Gastroenteritis     Musculoskeletal and Integument   Arthritis   Relevant Medications   etodolac (LODINE) 500 MG tablet    Other Visit Diagnoses    Left low back pain, with sciatica presence unspecified        Relevant Medications    etodolac (LODINE) 500 MG tablet    GERD without esophagitis        Relevant Medications    pantoprazole (PROTONIX) 40 MG tablet         Dr. Deanna Jones [facility]  06/18/2015

## 2015-08-02 ENCOUNTER — Encounter: Payer: Self-pay | Admitting: Family Medicine

## 2015-08-02 ENCOUNTER — Ambulatory Visit (INDEPENDENT_AMBULATORY_CARE_PROVIDER_SITE_OTHER): Payer: Medicare PPO | Admitting: Family Medicine

## 2015-08-02 VITALS — BP 120/80 | HR 66 | Ht 67.0 in | Wt 195.0 lb

## 2015-08-02 DIAGNOSIS — K2270 Barrett's esophagus without dysplasia: Secondary | ICD-10-CM

## 2015-08-02 DIAGNOSIS — I10 Essential (primary) hypertension: Secondary | ICD-10-CM

## 2015-08-02 DIAGNOSIS — K219 Gastro-esophageal reflux disease without esophagitis: Secondary | ICD-10-CM

## 2015-08-02 DIAGNOSIS — Z23 Encounter for immunization: Secondary | ICD-10-CM

## 2015-08-02 DIAGNOSIS — M545 Low back pain: Secondary | ICD-10-CM

## 2015-08-02 DIAGNOSIS — J301 Allergic rhinitis due to pollen: Secondary | ICD-10-CM

## 2015-08-02 DIAGNOSIS — M1990 Unspecified osteoarthritis, unspecified site: Secondary | ICD-10-CM

## 2015-08-02 MED ORDER — ETODOLAC 500 MG PO TABS: 500.0000 mg | ORAL_TABLET | Freq: Two times a day (BID) | ORAL | Status: DC

## 2015-08-02 MED ORDER — MOMETASONE FUROATE 50 MCG/ACT NA SUSP: 2.0000 | Freq: Every day | NASAL | Status: DC

## 2015-08-02 MED ORDER — BENAZEPRIL-HYDROCHLOROTHIAZIDE 10-12.5 MG PO TABS: 1.0000 | ORAL_TABLET | Freq: Every day | ORAL | Status: DC

## 2015-08-02 MED ORDER — PANTOPRAZOLE SODIUM 40 MG PO TBEC: 40.0000 mg | DELAYED_RELEASE_TABLET | Freq: Every day | ORAL | Status: DC

## 2015-08-02 NOTE — Progress Notes (Signed)
Name: Timothy Hatfield   MRN: [id number]    DOB: 1946/06/27   Date:08/02/2015       Progress Note  Subjective  Chief Complaint  Chief Complaint  Patient presents with  . Gastrophageal Reflux  . Allergic Rhinitis   . Arthritis    Gastrophageal Reflux He complains of heartburn. He reports no abdominal pain, no belching, no chest pain, no choking, no coughing, no dysphagia, no early satiety, no globus sensation, no hoarse voice, no nausea, no sore throat, no stridor or no wheezing. This is a recurrent problem. The current episode started more than 1 year ago. The problem occurs frequently. The problem has been unchanged. Pertinent negatives include no anemia, fatigue, melena, muscle weakness, orthopnea or weight loss. The treatment provided mild relief.  Arthritis Presents for follow-up visit. Pertinent negatives include no diarrhea, dry eyes, dry mouth, dysuria, fatigue, fever, pain at night, pain while resting, rash, Raynaud's syndrome, uveitis or weight loss. There is no history of chronic back pain, lupus, osteoarthritis, psoriasis or rheumatoid arthritis.   Sinus Problem This is a recurrent problem. The current episode started more than 1 year ago. There has been no fever. Associated symptoms include congestion. Pertinent negatives include no chills, coughing, diaphoresis, ear pain, headaches, hoarse voice, neck pain, shortness of breath, sinus pressure, sneezing, sore throat or swollen glands. Treatments tried: fluticsasone. The treatment provided no relief.  Hypertension This is a chronic problem. The current episode started more than 1 year ago. The problem is uncontrolled. Pertinent negatives include no anxiety, blurred vision, chest pain, headaches, malaise/fatigue, neck pain, orthopnea, palpitations, peripheral edema, PND, shortness of breath or sweats. There are no associated agents to hypertension. Risk factors for coronary artery disease include dyslipidemia. The current treatment  provides moderate improvement. There is no history of angina, kidney disease, CAD/MI, CVA, heart failure, left ventricular hypertrophy, PVD, renovascular disease or retinopathy. There is no history of chronic renal disease.    No problem-specific assessment & plan notes found for this encounter.   Past Medical History  Diagnosis Date  . Hypertension   . Hyperlipidemia   . Allergy   . GERD (gastroesophageal reflux disease)     Past Surgical History  Procedure Laterality Date  . Hernia repair    . Kidney stones      lithotripsy  . Colonoscopy  2013    normal/ Dr Vira Agar- cleared for 10 yrs  . Upper gi endoscopy  2013    History reviewed. No pertinent family history.  Social History   Social History  . Marital Status: Married    Spouse Name: N/A  . Number of Children: N/A  . Years of Education: N/A   Occupational History  . Not on file.   Social History Main Topics  . Smoking status: Never Smoker   . Smokeless tobacco: Not on file  . Alcohol Use: No  . Drug Use: No  . Sexual Activity: No   Other Topics Concern  . Not on file   Social History Narrative    Allergies  Allergen Reactions  . Bee Venom     Has epi- pen on hand     Review of Systems  Constitutional: Negative for fever, chills, weight loss, malaise/fatigue, diaphoresis and fatigue.  HENT: Positive for congestion. Negative for ear discharge, ear pain, hoarse voice, sinus pressure, sneezing and sore throat.   Eyes: Negative for blurred vision.  Respiratory: Negative for cough, sputum production, choking, shortness of breath and wheezing.   Cardiovascular: Negative for  chest pain, palpitations, orthopnea, leg swelling and PND.  Gastrointestinal: Positive for heartburn. Negative for dysphagia, nausea, abdominal pain, diarrhea, constipation, blood in stool and melena.  Genitourinary: Negative for dysuria, urgency, frequency and hematuria.  Musculoskeletal: Positive for arthritis. Negative for  myalgias, back pain, joint pain, muscle weakness and neck pain.  Skin: Negative for rash.  Neurological: Negative for dizziness, tingling, sensory change, focal weakness and headaches.  Endo/Heme/Allergies: Negative for environmental allergies and polydipsia. Does not bruise/bleed easily.  Psychiatric/Behavioral: Negative for depression and suicidal ideas. The patient is not nervous/anxious and does not have insomnia.      Objective  Filed Vitals:   08/02/15 1400  BP: 120/80  Pulse: 66  Height: 5\' 7"  (1.702 m)  Weight: 195 lb (88.451 kg)    Physical Exam  Constitutional: He is oriented to person, place, and time and well-developed, well-nourished, and in no distress.  HENT:  Head: Normocephalic.  Right Ear: External ear normal.  Left Ear: External ear normal.  Nose: Nose normal.  Mouth/Throat: Oropharynx is clear and moist.  Eyes: Conjunctivae and EOM are normal. Pupils are equal, round, and reactive to light. Right eye exhibits no discharge. Left eye exhibits no discharge. No scleral icterus.  Neck: Normal range of motion. Neck supple. No JVD present. No tracheal deviation present. No thyromegaly present.  Cardiovascular: Normal rate, regular rhythm, normal heart sounds and intact distal pulses.  Exam reveals no gallop and no friction rub.   No murmur heard. Pulmonary/Chest: Breath sounds normal. No respiratory distress. He has no wheezes. He has no rales.  Abdominal: Soft. Bowel sounds are normal. He exhibits no mass. There is no hepatosplenomegaly. There is no tenderness. There is no rebound, no guarding and no CVA tenderness.  Musculoskeletal: Normal range of motion. He exhibits no edema or tenderness.  Lymphadenopathy:    He has no cervical adenopathy.  Neurological: He is alert and oriented to person, place, and time. He has normal sensation, normal strength, normal reflexes and intact cranial nerves. No cranial nerve deficit.  Skin: Skin is warm. No rash noted.   Psychiatric: Mood and affect normal.      Assessment & Plan  Problem List Items Addressed This Visit      Cardiovascular and Mediastinum   Essential (primary) hypertension - Primary   Relevant Medications   benazepril-hydrochlorthiazide (LOTENSIN HCT) 10-12.5 MG per tablet   Other Relevant Orders   Renal Function Panel     Respiratory   Allergic rhinitis     Digestive   Barrett esophagus   Relevant Medications   pantoprazole (PROTONIX) 40 MG tablet     Musculoskeletal and Integument   Arthritis   Relevant Medications   etodolac (LODINE) 500 MG tablet    Other Visit Diagnoses    Left low back pain, with sciatica presence unspecified        Relevant Medications    etodolac (LODINE) 500 MG tablet    GERD without esophagitis        Relevant Medications    pantoprazole (PROTONIX) 40 MG tablet         Dr. Deanna Jones [facility]  08/02/2015

## 2015-08-02 NOTE — Patient Instructions (Signed)

## 2015-08-03 LAB — RENAL FUNCTION PANEL
Albumin: 4.2 g/dL (ref 3.6–4.8)
BUN / CREAT RATIO: 13 (ref 10–22)
BUN: 14 mg/dL (ref 8–27)
CO2: 26 mmol/L (ref 18–29)
CREATININE: 1.08 mg/dL (ref 0.76–1.27)
Calcium: 9.6 mg/dL (ref 8.6–10.2)
Chloride: 101 mmol/L (ref 97–108)
GFR calc Af Amer: 81 mL/min/{1.73_m2} (ref >59)
GFR, EST NON AFRICAN AMERICAN: 70 mL/min/{1.73_m2} (ref >59)
Glucose: 90 mg/dL (ref 65–99)
PHOSPHORUS: 3.3 mg/dL (ref 2.5–4.5)
Potassium: 4.5 mmol/L (ref 3.5–5.2)
SODIUM: 143 mmol/L (ref 134–144)

## 2015-11-06 ENCOUNTER — Ambulatory Visit (INDEPENDENT_AMBULATORY_CARE_PROVIDER_SITE_OTHER): Payer: Medicare PPO | Admitting: Family Medicine

## 2015-11-06 ENCOUNTER — Encounter: Payer: Self-pay | Admitting: Family Medicine

## 2015-11-06 VITALS — BP 140/60 | HR 72 | Temp 98.5°F | Ht 67.0 in | Wt 201.0 lb

## 2015-11-06 DIAGNOSIS — J40 Bronchitis, not specified as acute or chronic: Secondary | ICD-10-CM

## 2015-11-06 DIAGNOSIS — J0100 Acute maxillary sinusitis, unspecified: Secondary | ICD-10-CM

## 2015-11-06 MED ORDER — GUAIFENESIN-CODEINE 100-10 MG/5ML PO SOLN
5.0000 mL | Freq: Three times a day (TID) | ORAL | Status: DC | PRN
Start: 2015-11-06 — End: 2016-02-11

## 2015-11-06 MED ORDER — AMOXICILLIN 500 MG PO CAPS: 500.0000 mg | ORAL_CAPSULE | Freq: Three times a day (TID) | ORAL | Status: DC

## 2015-11-06 NOTE — Progress Notes (Signed)
Name: Timothy Hatfield   MRN: [id number]    DOB: 1946/06/27   Date:11/06/2015       Progress Note  Subjective  Chief Complaint  Chief Complaint  Patient presents with  . Sinusitis    started x 2 days ago with a tickle in the throat- cough and cong/ using cough drops    Sinusitis This is a chronic problem. The current episode started in the past 7 days. The problem has been gradually worsening since onset. There has been no fever. Associated symptoms include congestion, coughing, headaches, shortness of breath, sinus pressure and a sore throat. Pertinent negatives include no chills, diaphoresis, ear pain, hoarse voice, neck pain, sneezing or swollen glands. Past treatments include acetaminophen and oral decongestants. The treatment provided mild relief.  Cough This is a new problem. The current episode started in the past 7 days. The problem has been waxing and waning. The problem occurs hourly. The cough is non-productive. Associated symptoms include headaches, nasal congestion, postnasal drip, a sore throat, shortness of breath and wheezing. Pertinent negatives include no chest pain, chills, ear pain, fever, heartburn, myalgias, rash or weight loss. The symptoms are aggravated by pollens. The treatment provided mild relief. There is no history of environmental allergies.    No problem-specific assessment & plan notes found for this encounter.   Past Medical History  Diagnosis Date  . Hypertension   . Hyperlipidemia   . Allergy   . GERD (gastroesophageal reflux disease)     Past Surgical History  Procedure Laterality Date  . Hernia repair    . Kidney stones      lithotripsy  . Colonoscopy  2013    normal/ Dr Vira Agar- cleared for 10 yrs  . Upper gi endoscopy  2013    No family history on file.  Social History   Social History  . Marital Status: Married    Spouse Name: N/A  . Number of Children: N/A  . Years of Education: N/A   Occupational History  . Not on file.    Social History Main Topics  . Smoking status: Never Smoker   . Smokeless tobacco: Not on file  . Alcohol Use: No  . Drug Use: No  . Sexual Activity: No   Other Topics Concern  . Not on file   Social History Narrative    Allergies  Allergen Reactions  . Bee Venom     Has epi- pen on hand     Review of Systems  Constitutional: Negative for fever, chills, weight loss, malaise/fatigue and diaphoresis.  HENT: Positive for congestion, postnasal drip, sinus pressure and sore throat. Negative for ear discharge, ear pain, hoarse voice and sneezing.   Eyes: Negative for blurred vision.  Respiratory: Positive for cough, shortness of breath and wheezing. Negative for sputum production.   Cardiovascular: Negative for chest pain, palpitations and leg swelling.  Gastrointestinal: Negative for heartburn, nausea, abdominal pain, diarrhea, constipation, blood in stool and melena.  Genitourinary: Negative for dysuria, urgency, frequency and hematuria.  Musculoskeletal: Negative for myalgias, back pain, joint pain and neck pain.  Skin: Negative for rash.  Neurological: Positive for headaches. Negative for dizziness, tingling, sensory change and focal weakness.  Endo/Heme/Allergies: Negative for environmental allergies and polydipsia. Does not bruise/bleed easily.  Psychiatric/Behavioral: Negative for depression and suicidal ideas. The patient is not nervous/anxious and does not have insomnia.      Objective  Filed Vitals:   11/06/15 1047  BP: 140/60  Pulse: 72  Temp: 98.5 F (  36.9 C)  TempSrc: Oral  Height: 5\' 7"  (1.702 m)  Weight: 201 lb (91.173 kg)  SpO2: 97%    Physical Exam  Constitutional: He is oriented to person, place, and time and well-developed, well-nourished, and in no distress.  HENT:  Head: Normocephalic.  Right Ear: External ear normal.  Left Ear: External ear normal.  Nose: Nose normal.  Mouth/Throat: Oropharynx is clear and moist.  Eyes: Conjunctivae and EOM  are normal. Pupils are equal, round, and reactive to light. Right eye exhibits no discharge. Left eye exhibits no discharge. No scleral icterus.  Neck: Normal range of motion. Neck supple. No JVD present. No tracheal deviation present. No thyromegaly present.  Cardiovascular: Normal rate, regular rhythm, normal heart sounds and intact distal pulses.  Exam reveals no gallop and no friction rub.   No murmur heard. Pulmonary/Chest: Breath sounds normal. No respiratory distress. He has no wheezes. He has no rales.  Abdominal: Soft. Bowel sounds are normal. He exhibits no mass. There is no hepatosplenomegaly. There is no tenderness. There is no rebound, no guarding and no CVA tenderness.  Musculoskeletal: Normal range of motion. He exhibits no edema or tenderness.  Lymphadenopathy:    He has no cervical adenopathy.  Neurological: He is alert and oriented to person, place, and time. He has normal sensation, normal strength, normal reflexes and intact cranial nerves. No cranial nerve deficit.  Skin: Skin is warm. No rash noted.  Psychiatric: Mood and affect normal.  Nursing note and vitals reviewed.     Assessment & Plan  Problem List Items Addressed This Visit    None    Visit Diagnoses    Acute maxillary sinusitis, recurrence not specified    -  Primary    Relevant Medications    amoxicillin (AMOXIL) 500 MG capsule    guaiFENesin-codeine 100-10 MG/5ML syrup    Bronchitis        Relevant Medications    guaiFENesin-codeine 100-10 MG/5ML syrup         Dr. Macon [facility]  11/06/2015

## 2016-02-10 ENCOUNTER — Other Ambulatory Visit: Payer: Self-pay

## 2016-02-10 DIAGNOSIS — I10 Essential (primary) hypertension: Secondary | ICD-10-CM

## 2016-02-10 MED ORDER — BENAZEPRIL-HYDROCHLOROTHIAZIDE 10-12.5 MG PO TABS: 1.0000 | ORAL_TABLET | Freq: Every day | ORAL | Status: DC

## 2016-02-11 ENCOUNTER — Ambulatory Visit (INDEPENDENT_AMBULATORY_CARE_PROVIDER_SITE_OTHER): Payer: Medicare Other | Admitting: Family Medicine

## 2016-02-11 ENCOUNTER — Encounter: Payer: Self-pay | Admitting: Family Medicine

## 2016-02-11 VITALS — BP 138/78 | HR 78 | Ht 67.0 in | Wt 200.0 lb

## 2016-02-11 DIAGNOSIS — J301 Allergic rhinitis due to pollen: Secondary | ICD-10-CM

## 2016-02-11 DIAGNOSIS — E785 Hyperlipidemia, unspecified: Secondary | ICD-10-CM | POA: Diagnosis not present

## 2016-02-11 DIAGNOSIS — K2100 Gastro-esophageal reflux disease with esophagitis, without bleeding: Secondary | ICD-10-CM

## 2016-02-11 DIAGNOSIS — K2270 Barrett's esophagus without dysplasia: Secondary | ICD-10-CM

## 2016-02-11 DIAGNOSIS — I10 Essential (primary) hypertension: Secondary | ICD-10-CM

## 2016-02-11 DIAGNOSIS — G4730 Sleep apnea, unspecified: Secondary | ICD-10-CM | POA: Diagnosis not present

## 2016-02-11 DIAGNOSIS — K219 Gastro-esophageal reflux disease without esophagitis: Secondary | ICD-10-CM | POA: Diagnosis not present

## 2016-02-11 MED ORDER — MOMETASONE FUROATE 50 MCG/ACT NA SUSP: 2.0000 | Freq: Every day | NASAL | Status: DC

## 2016-02-11 MED ORDER — BENAZEPRIL-HYDROCHLOROTHIAZIDE 10-12.5 MG PO TABS: 1.0000 | ORAL_TABLET | Freq: Every day | ORAL | Status: DC

## 2016-02-11 MED ORDER — PANTOPRAZOLE SODIUM 40 MG PO TBEC: 40.0000 mg | DELAYED_RELEASE_TABLET | Freq: Every day | ORAL | Status: DC

## 2016-02-11 NOTE — Progress Notes (Signed)
Name: Timothy Hatfield   MRN: [id number]    DOB: 1946/09/06   Date:02/11/2016       Progress Note  Subjective  Chief Complaint  Chief Complaint  Patient presents with  . Hypertension  . Gynecologic Exam  . Allergic Rhinitis   . Sleep Apnea    HPI Comments: Patient presents for sleep apnea evaluation.  Witnessed apnea episodes/ snoring/ daytime somnolence.  Hypertension This is a chronic problem. The current episode started more than 1 year ago. The problem has been gradually improving since onset. The problem is controlled. Pertinent negatives include no anxiety, blurred vision, chest pain, headaches, malaise/fatigue, neck pain, orthopnea, palpitations, peripheral edema, PND, shortness of breath or sweats. There are no associated agents to hypertension. There are no known risk factors for coronary artery disease. Past treatments include ACE inhibitors and diuretics. The current treatment provides mild improvement. There are no compliance problems.  There is no history of angina, kidney disease, CAD/MI, CVA, heart failure, left ventricular hypertrophy, PVD, renovascular disease or retinopathy. There is no history of chronic renal disease or a hypertension causing med.  Sinus Problem This is a chronic problem. The current episode started more than 1 year ago. The problem has been waxing and waning since onset. Associated symptoms include congestion and sinus pressure. Pertinent negatives include no chills, coughing, ear pain, headaches, neck pain, shortness of breath or sore throat. Treatments tried: nasal steroid. The treatment provided mild relief.    No problem-specific assessment & plan notes found for this encounter.   Past Medical History  Diagnosis Date  . Hypertension   . Hyperlipidemia   . Allergy   . GERD (gastroesophageal reflux disease)     Past Surgical History  Procedure Laterality Date  . Hernia repair    . Kidney stones      lithotripsy  . Colonoscopy  2013   normal/ Dr Vira Agar- cleared for 10 yrs  . Upper gi endoscopy  2013    No family history on file.  Social History   Social History  . Marital Status: Married    Spouse Name: N/A  . Number of Children: N/A  . Years of Education: N/A   Occupational History  . Not on file.   Social History Main Topics  . Smoking status: Never Smoker   . Smokeless tobacco: Not on file  . Alcohol Use: No  . Drug Use: No  . Sexual Activity: No   Other Topics Concern  . Not on file   Social History Narrative    Allergies  Allergen Reactions  . Bee Venom     Has epi- pen on hand     Review of Systems  Constitutional: Negative for fever, chills, weight loss and malaise/fatigue.  HENT: Positive for congestion and sinus pressure. Negative for ear discharge, ear pain and sore throat.   Eyes: Negative for blurred vision.  Respiratory: Negative for cough, sputum production, shortness of breath and wheezing.   Cardiovascular: Negative for chest pain, palpitations, orthopnea, leg swelling and PND.  Gastrointestinal: Negative for heartburn, nausea, abdominal pain, diarrhea, constipation, blood in stool and melena.  Genitourinary: Negative for dysuria, urgency, frequency and hematuria.  Musculoskeletal: Negative for myalgias, back pain, joint pain and neck pain.  Skin: Negative for rash.  Neurological: Negative for dizziness, tingling, sensory change, focal weakness and headaches.  Endo/Heme/Allergies: Negative for environmental allergies and polydipsia. Does not bruise/bleed easily.  Psychiatric/Behavioral: Negative for depression and suicidal ideas. The patient is not nervous/anxious and does not  have insomnia.      Objective  Filed Vitals:   02/11/16 1037  BP: 138/78  Pulse: 78  Height: 5\' 7"  (1.702 m)  Weight: 200 lb (90.719 kg)    Physical Exam  Constitutional: He is oriented to person, place, and time and well-developed, well-nourished, and in no distress.  HENT:  Head:  Normocephalic.  Right Ear: External ear normal.  Left Ear: External ear normal.  Nose: Nose normal.  Mouth/Throat: Oropharynx is clear and moist.  Eyes: Conjunctivae and EOM are normal. Pupils are equal, round, and reactive to light. Right eye exhibits no discharge. Left eye exhibits no discharge. No scleral icterus.  Neck: Normal range of motion. Neck supple. No JVD present. No tracheal deviation present. No thyromegaly present.  Cardiovascular: Normal rate, regular rhythm, normal heart sounds and intact distal pulses.  Exam reveals no gallop and no friction rub.   No murmur heard. Pulmonary/Chest: Breath sounds normal. No respiratory distress. He has no wheezes. He has no rales.  Abdominal: Soft. Bowel sounds are normal. He exhibits no mass. There is no hepatosplenomegaly. There is no tenderness. There is no rebound, no guarding and no CVA tenderness.  Musculoskeletal: Normal range of motion. He exhibits no edema or tenderness.  Lymphadenopathy:    He has no cervical adenopathy.  Neurological: He is alert and oriented to person, place, and time. He has normal sensation, normal strength, normal reflexes and intact cranial nerves. No cranial nerve deficit.  Skin: Skin is warm. No rash noted.  Psychiatric: Mood and affect normal.  Nursing note and vitals reviewed.     Assessment & Plan  Problem List Items Addressed This Visit      Cardiovascular and Mediastinum   Essential (primary) hypertension - Primary   Relevant Medications   benazepril-hydrochlorthiazide (LOTENSIN HCT) 10-12.5 MG tablet   Other Relevant Orders   Renal Function Panel     Respiratory   Allergic rhinitis   Relevant Medications   mometasone (NASONEX) 50 MCG/ACT nasal spray     Digestive   Barrett esophagus   Relevant Medications   pantoprazole (PROTONIX) 40 MG tablet     Other   HLD (hyperlipidemia)   Relevant Medications   benazepril-hydrochlorthiazide (LOTENSIN HCT) 10-12.5 MG tablet   Other Relevant  Orders   Lipid Profile    Other Visit Diagnoses    Gastroesophageal reflux disease with esophagitis        GERD without esophagitis        Relevant Medications    pantoprazole (PROTONIX) 40 MG tablet    Sleep apnea        referral sleep study      Pt will call us to let us know who he needs to go to   Dr. [facility]  02/11/2016

## 2016-02-12 LAB — LIPID PANEL
CHOL/HDL RATIO: 4.3 ratio (ref 0.0–5.0)
Cholesterol, Total: 217 mg/dL — ABNORMAL HIGH (ref 100–199)
HDL: 50 mg/dL (ref >39)
LDL CALC: 141 mg/dL — AB (ref 0–99)
Triglycerides: 130 mg/dL (ref 0–149)
VLDL CHOLESTEROL CAL: 26 mg/dL (ref 5–40)

## 2016-02-12 LAB — RENAL FUNCTION PANEL
Albumin: 4.4 g/dL (ref 3.5–4.8)
BUN/Creatinine Ratio: 14 (ref 10–22)
BUN: 15 mg/dL (ref 8–27)
CO2: 22 mmol/L (ref 18–29)
CREATININE: 1.1 mg/dL (ref 0.76–1.27)
Calcium: 9.3 mg/dL (ref 8.6–10.2)
Chloride: 102 mmol/L (ref 96–106)
GFR calc Af Amer: 78 mL/min/{1.73_m2} (ref >59)
GFR calc non Af Amer: 68 mL/min/{1.73_m2} (ref >59)
Glucose: 123 mg/dL — ABNORMAL HIGH (ref 65–99)
Phosphorus: 2.9 mg/dL (ref 2.5–4.5)
Potassium: 4.5 mmol/L (ref 3.5–5.2)
Sodium: 142 mmol/L (ref 134–144)

## 2016-02-13 NOTE — Addendum Note (Signed)
Addended by: Fredderick Severance on: 02/13/2016 10:51 AM   Modules accepted: Orders

## 2016-03-11 ENCOUNTER — Ambulatory Visit: Payer: Medicare Other | Attending: Specialist

## 2016-03-11 DIAGNOSIS — G4733 Obstructive sleep apnea (adult) (pediatric): Secondary | ICD-10-CM | POA: Diagnosis present

## 2016-03-17 ENCOUNTER — Other Ambulatory Visit: Payer: Self-pay

## 2016-03-20 ENCOUNTER — Ambulatory Visit: Payer: Medicare Other | Attending: Specialist

## 2016-03-20 DIAGNOSIS — G4733 Obstructive sleep apnea (adult) (pediatric): Secondary | ICD-10-CM | POA: Insufficient documentation

## 2016-03-20 DIAGNOSIS — G4761 Periodic limb movement disorder: Secondary | ICD-10-CM | POA: Diagnosis not present

## 2016-05-22 ENCOUNTER — Encounter: Payer: Self-pay | Admitting: Family Medicine

## 2016-05-22 ENCOUNTER — Ambulatory Visit (INDEPENDENT_AMBULATORY_CARE_PROVIDER_SITE_OTHER): Payer: Medicare Other | Admitting: Family Medicine

## 2016-05-22 VITALS — BP 122/80 | HR 88 | Temp 98.6°F | Ht 67.0 in | Wt 197.0 lb

## 2016-05-22 DIAGNOSIS — G4730 Sleep apnea, unspecified: Secondary | ICD-10-CM

## 2016-05-22 DIAGNOSIS — E860 Dehydration: Secondary | ICD-10-CM | POA: Diagnosis not present

## 2016-05-22 DIAGNOSIS — A084 Viral intestinal infection, unspecified: Secondary | ICD-10-CM

## 2016-05-22 DIAGNOSIS — R55 Syncope and collapse: Secondary | ICD-10-CM | POA: Diagnosis not present

## 2016-05-22 DIAGNOSIS — R5381 Other malaise: Secondary | ICD-10-CM | POA: Diagnosis not present

## 2016-05-22 DIAGNOSIS — Z7189 Other specified counseling: Secondary | ICD-10-CM

## 2016-05-22 MED ORDER — DOXYCYCLINE HYCLATE 100 MG PO TABS: 100.0000 mg | ORAL_TABLET | Freq: Two times a day (BID) | ORAL | Status: DC

## 2016-05-22 NOTE — Progress Notes (Signed)
Name: Timothy Hatfield   MRN: [id number]    DOB: October 06, 1946   Date:05/22/2016       Progress Note  Subjective  Chief Complaint  Chief Complaint  Patient presents with  . Chills    diarrhea, abdominal cramping, feel "give out"  . Follow-up    sleep apnea- been using the machine for approx 6 weeks- insurance needed him to follow up with pcp to document that it is working- feels much better "it's changed my life"    HPI Comments: Patient presents for follow up of cpap use for obstructive sleep apnea.  Relates signicant in sleep pattern and daytime progression. Tolerates well with no adjustments necessary.   Diarrhea  Associated symptoms include chills. Pertinent negatives include no abdominal pain, coughing, fever, headaches, myalgias or weight loss.  Other This is a new (malaise / feeling washed out) problem. The current episode started in the past 7 days. The problem occurs 2 to 4 times per day. The problem has been gradually worsening. Associated symptoms include chills and nausea. Pertinent negatives include no abdominal pain, chest pain, coughing, fever, headaches, myalgias, neck pain, rash or sore throat. Associated symptoms comments: Feeling swimmy headed ,malaise, lighthaeded. He has tried nothing for the symptoms.    No problem-specific assessment & plan notes found for this encounter.   Past Medical History  Diagnosis Date  . Hypertension   . Hyperlipidemia   . Allergy   . GERD (gastroesophageal reflux disease)     Past Surgical History  Procedure Laterality Date  . Hernia repair    . Kidney stones      lithotripsy  . Colonoscopy  2013    normal/ Dr Vira Agar- cleared for 10 yrs  . Upper gi endoscopy  2013    History reviewed. No pertinent family history.  Social History   Social History  . Marital Status: Married    Spouse Name: N/A  . Number of Children: N/A  . Years of Education: N/A   Occupational History  . Not on file.   Social History Main Topics   . Smoking status: Never Smoker   . Smokeless tobacco: Not on file  . Alcohol Use: No  . Drug Use: No  . Sexual Activity: No   Other Topics Concern  . Not on file   Social History Narrative    Allergies  Allergen Reactions  . Bee Venom     Has epi- pen on hand     Review of Systems  Constitutional: Positive for chills. Negative for fever, weight loss and malaise/fatigue.  HENT: Negative for ear discharge, ear pain and sore throat.   Eyes: Negative for blurred vision.  Respiratory: Negative for cough, sputum production, shortness of breath and wheezing.   Cardiovascular: Negative for chest pain, palpitations and leg swelling.  Gastrointestinal: Positive for nausea and diarrhea. Negative for heartburn, abdominal pain, constipation, blood in stool and melena.  Genitourinary: Negative for dysuria, urgency, frequency and hematuria.  Musculoskeletal: Negative for myalgias, back pain, joint pain and neck pain.  Skin: Negative for rash.  Neurological: Negative for dizziness, tingling, sensory change, focal weakness and headaches.  Endo/Heme/Allergies: Negative for environmental allergies and polydipsia. Does not bruise/bleed easily.  Psychiatric/Behavioral: Negative for depression and suicidal ideas. The patient is not nervous/anxious and does not have insomnia.      Objective  Filed Vitals:   05/22/16 1016  BP: 122/80  Pulse: 88  Temp: 98.6 F (37 C)  TempSrc: Oral  Height: 5\' 7"  (1.702 m)  Weight: 197 lb (89.359 kg)    Physical Exam  Constitutional: He is oriented to person, place, and time and well-developed, well-nourished, and in no distress. No distress.  HENT:  Head: Normocephalic.  Right Ear: External ear normal.  Left Ear: External ear normal.  Nose: Nose normal.  Mouth/Throat: Oropharynx is clear and moist.  Eyes: Conjunctivae and EOM are normal. Pupils are equal, round, and reactive to light. Right eye exhibits no discharge. Left eye exhibits no discharge.  No scleral icterus.  Neck: Normal range of motion. Neck supple. No JVD present. No tracheal deviation present. No thyromegaly present.  Cardiovascular: Normal rate, regular rhythm, normal heart sounds and intact distal pulses.  Exam reveals no gallop and no friction rub.   No murmur heard. Pulmonary/Chest: Breath sounds normal. No respiratory distress. He has no wheezes. He has no rales.  Abdominal: Soft. Bowel sounds are normal. He exhibits no mass. There is no hepatosplenomegaly. There is no tenderness. There is no rebound, no guarding and no CVA tenderness.  Musculoskeletal: Normal range of motion. He exhibits no edema or tenderness.  Lymphadenopathy:    He has no cervical adenopathy.  Neurological: He is alert and oriented to person, place, and time. He has normal sensation, normal strength, normal reflexes and intact cranial nerves. No cranial nerve deficit.  Skin: Skin is warm. No rash noted. He is not diaphoretic.  Psychiatric: Mood and affect normal.  Nursing note and vitals reviewed.     Assessment & Plan  Problem List Items Addressed This Visit    None    Visit Diagnoses    Near syncope    -  Primary    Relevant Orders    EKG 12-Lead (Completed)    Malaise        Relevant Medications    doxycycline (VIBRA-TABS) 100 MG tablet    Dehydration        Viral enteritis        Encounter for counseling for sleep apnea        review experience with cpap         Dr. [facility]  05/22/2016

## 2016-05-22 NOTE — Patient Instructions (Signed)
Dehydration  Dehydration is when you lose more fluids from the body than you take in. Vital organs such as the kidneys, brain, and heart cannot function without a proper amount of fluids and salt. Any loss of fluids from the body can cause dehydration.   Older adults are at a higher risk of dehydration than younger adults. As we age, our bodies are less able to conserve water and do not respond to temperature changes as well. Also, older adults do not become thirsty as easily or quickly. Because of this, older adults often do not realize they need to increase fluids to avoid dehydration.   CAUSES    Vomiting.   Diarrhea.   Excessive sweating.   Excessive urination.   Fever.   Certain medicines, such as blood pressure medicines called diuretics.   Poorly controlled blood sugars.  SIGNS AND SYMPTOMS   Mild dehydration:   Thirst.   Dry lips.   Slightly dry mouth.  Moderate dehydration:   Very dry mouth.   Sunken eyes.   Skin does not bounce back quickly when lightly pinched and released.   Dark urine and decreased urine production.   Decreased tear production.   Headache.  Severe dehydration:   Very dry mouth.   Extreme thirst.   Rapid, weak pulse (more than 100 beats per minute at rest).   Cold hands and feet.   Not able to sweat in spite of heat.   Rapid breathing.   Blue lips.   Confusion and lethargy.   Difficulty being awakened.   Minimal urine production.   No tears.  DIAGNOSIS   Your health care provider will diagnose dehydration based on your symptoms and your exam. Blood and urine tests will help confirm the diagnosis. The diagnostic evaluation should also identify the cause of dehydration.  TREATMENT   Treatment of mild or moderate dehydration can often be done at home by increasing the amount of fluids that you drink. It is best to drink small amounts of fluid more often. Drinking too much at one time can make vomiting worse. Severe dehydration needs to be treated at the hospital.  You may be given IV fluids that contain water and electrolytes.  HOME CARE INSTRUCTIONS    Ask your health care provider about specific rehydration instructions.   Drink enough fluids to keep your urine clear or pale yellow.   Drink small amounts frequently if you have nausea and vomiting.   Eat as you normally do.   Avoid:    Foods or drinks high in sugar.    Carbonated drinks.    Juice.    Extremely hot or cold fluids.    Drinks with caffeine.    Fatty, greasy foods.    Alcohol.    Tobacco.    Overeating.    Gelatin desserts.   Wash your hands well to avoid spreading bacteria and viruses.   Only take over-the-counter or prescription medicines for pain, discomfort, or fever as directed by your health care provider.   Ask your health care provider if you should continue all prescribed and over-the-counter medicines.   Keep all follow-up appointments with your health care provider.  SEEK MEDICAL CARE IF:   You have abdominal pain, and it increases or stays in one area (localizes).   You have a rash, stiff neck, or severe headache.   You are irritable, sleepy, or difficult to awaken.   You are weak, dizzy, or extremely thirsty.   You have a fever.    SEEK IMMEDIATE MEDICAL CARE IF:    You are unable to keep fluids down, or you get worse despite treatment.   You have frequent episodes of vomiting or diarrhea.   You have blood or green matter (bile) in your vomit.   You have blood in your stool, or your stool looks black and tarry.   You have not urinated in 6-8 hours, or you have only urinated a small amount of very dark urine.   You faint.  MAKE SURE YOU:    Understand these instructions.   Will watch your condition.   Will get help right away if you are not doing well or get worse.     This information is not intended to replace advice given to you by your health care provider. Make sure you discuss any questions you have with your health care provider.     Document Released: 01/30/2004 Document  Revised: 11/14/2013 Document Reviewed: 07/17/2013  Elsevier Interactive Patient Education 2016 [employer]

## 2016-07-07 ENCOUNTER — Other Ambulatory Visit: Payer: Self-pay

## 2016-08-17 ENCOUNTER — Ambulatory Visit (INDEPENDENT_AMBULATORY_CARE_PROVIDER_SITE_OTHER): Payer: Medicare Other | Admitting: Family Medicine

## 2016-08-17 ENCOUNTER — Encounter: Payer: Self-pay | Admitting: Family Medicine

## 2016-08-17 VITALS — BP 124/70 | HR 82 | Ht 67.0 in | Wt 195.0 lb

## 2016-08-17 DIAGNOSIS — Z23 Encounter for immunization: Secondary | ICD-10-CM

## 2016-08-17 DIAGNOSIS — M519 Unspecified thoracic, thoracolumbar and lumbosacral intervertebral disc disorder: Secondary | ICD-10-CM | POA: Diagnosis not present

## 2016-08-17 DIAGNOSIS — K2270 Barrett's esophagus without dysplasia: Secondary | ICD-10-CM | POA: Diagnosis not present

## 2016-08-17 DIAGNOSIS — M545 Low back pain: Secondary | ICD-10-CM | POA: Diagnosis not present

## 2016-08-17 DIAGNOSIS — K219 Gastro-esophageal reflux disease without esophagitis: Secondary | ICD-10-CM | POA: Diagnosis not present

## 2016-08-17 DIAGNOSIS — J301 Allergic rhinitis due to pollen: Secondary | ICD-10-CM

## 2016-08-17 DIAGNOSIS — E785 Hyperlipidemia, unspecified: Secondary | ICD-10-CM | POA: Diagnosis not present

## 2016-08-17 DIAGNOSIS — I10 Essential (primary) hypertension: Secondary | ICD-10-CM | POA: Diagnosis not present

## 2016-08-17 MED ORDER — MOMETASONE FUROATE 50 MCG/ACT NA SUSP: 2.0000 | Freq: Every day | NASAL | 11 refills | Status: DC

## 2016-08-17 MED ORDER — ETODOLAC 500 MG PO TABS: 500.0000 mg | ORAL_TABLET | Freq: Two times a day (BID) | ORAL | 11 refills | Status: DC

## 2016-08-17 MED ORDER — BENAZEPRIL-HYDROCHLOROTHIAZIDE 10-12.5 MG PO TABS: 1.0000 | ORAL_TABLET | Freq: Every day | ORAL | 1 refills | Status: DC

## 2016-08-17 MED ORDER — PANTOPRAZOLE SODIUM 40 MG PO TBEC: 40.0000 mg | DELAYED_RELEASE_TABLET | Freq: Every day | ORAL | 1 refills | Status: DC

## 2016-08-17 NOTE — Progress Notes (Signed)
Name: Timothy Hatfield   MRN: [id number]    DOB: 04-26-1946   Date:08/17/2016       Progress Note  Subjective  Chief Complaint  Chief Complaint  Patient presents with  . Hypertension  . Allergic Rhinitis   . Gastroesophageal Reflux  . Back Pain    hurts in the center of back- feels tight after blowing leaves    Hypertension  This is a chronic problem. The current episode started more than 1 year ago. The problem has been waxing and waning since onset. The problem is controlled. Pertinent negatives include no anxiety, blurred vision, chest pain, headaches, malaise/fatigue, neck pain, orthopnea, palpitations, peripheral edema, PND, shortness of breath or sweats. There are no associated agents to hypertension. There are no known risk factors for coronary artery disease. Past treatments include ACE inhibitors and diuretics. The current treatment provides mild improvement. There are no compliance problems.  There is no history of angina, kidney disease, CAD/MI, CVA, heart failure, left ventricular hypertrophy, PVD, renovascular disease or retinopathy. There is no history of chronic renal disease or a hypertension causing med.  Gastroesophageal Reflux  He reports no abdominal pain, no belching, no chest pain, no choking, no coughing, no dysphagia, no early satiety, no globus sensation, no heartburn, no hoarse voice, no nausea, no sore throat, no stridor, no water brash or no wheezing. This is a chronic problem. The current episode started more than 1 year ago. The problem occurs frequently. The problem has been gradually improving. The symptoms are aggravated by certain foods. Pertinent negatives include no anemia, fatigue, melena, muscle weakness, orthopnea or weight loss. There are no known risk factors. He has tried a PPI for the symptoms. The treatment provided mild relief.  Back Pain  This is a chronic problem. The current episode started more than 1 year ago. The problem occurs daily. The  problem has been waxing and waning since onset. The pain is present in the thoracic spine. The quality of the pain is described as aching. The pain does not radiate. The pain is at a severity of 5/10. The pain is moderate. Pertinent negatives include no abdominal pain, bladder incontinence, bowel incontinence, chest pain, dysuria, fever, headaches, leg pain, numbness, paresis, paresthesias, pelvic pain, perianal numbness, tingling, weakness or weight loss. He has tried NSAIDs for the symptoms. The treatment provided mild relief.    No problem-specific Assessment & Plan notes found for this encounter.   Past Medical History:  Diagnosis Date  . Allergy   . GERD (gastroesophageal reflux disease)   . Hyperlipidemia   . Hypertension     Past Surgical History:  Procedure Laterality Date  . COLONOSCOPY  2013   normal/ Dr Vira Agar- cleared for 10 yrs  . HERNIA REPAIR    . kidney stones     lithotripsy  . UPPER GI ENDOSCOPY  2013    No family history on file.  Social History   Social History  . Marital status: Married    Spouse name: N/A  . Number of children: N/A  . Years of education: N/A   Occupational History  . Not on file.   Social History Main Topics  . Smoking status: Never Smoker  . Smokeless tobacco: Not on file  . Alcohol use No  . Drug use: No  . Sexual activity: No   Other Topics Concern  . Not on file   Social History Narrative  . No narrative on file    Allergies  Allergen Reactions  .  Bee Venom     Has epi- pen on hand     Review of Systems  Constitutional: Negative for chills, fatigue, fever, malaise/fatigue and weight loss.  HENT: Negative for ear discharge, ear pain, hoarse voice and sore throat.   Eyes: Negative for blurred vision.  Respiratory: Negative for cough, sputum production, choking, shortness of breath and wheezing.   Cardiovascular: Negative for chest pain, palpitations, orthopnea, leg swelling and PND.  Gastrointestinal: Negative  for abdominal pain, blood in stool, bowel incontinence, constipation, diarrhea, dysphagia, heartburn, melena and nausea.  Genitourinary: Negative for bladder incontinence, dysuria, frequency, hematuria, pelvic pain and urgency.  Musculoskeletal: Positive for back pain. Negative for joint pain, myalgias, muscle weakness and neck pain.  Skin: Negative for rash.  Neurological: Negative for dizziness, tingling, sensory change, focal weakness, weakness, numbness, headaches and paresthesias.  Endo/Heme/Allergies: Negative for environmental allergies and polydipsia. Does not bruise/bleed easily.  Psychiatric/Behavioral: Negative for depression and suicidal ideas. The patient is not nervous/anxious and does not have insomnia.      Objective  Vitals:   08/17/16 1551  BP: 124/70  Pulse: 82  Weight: 195 lb (88.5 kg)  Height: 5\' 7"  (1.702 m)    Physical Exam  Constitutional: He is oriented to person, place, and time and well-developed, well-nourished, and in no distress.  HENT:  Head: Normocephalic.  Right Ear: External ear normal.  Left Ear: External ear normal.  Nose: Nose normal.  Mouth/Throat: Oropharynx is clear and moist.  Eyes: Conjunctivae and EOM are normal. Pupils are equal, round, and reactive to light. Right eye exhibits no discharge. Left eye exhibits no discharge. No scleral icterus.  Neck: Normal range of motion. Neck supple. No JVD present. No tracheal deviation present. No thyromegaly present.  Cardiovascular: Normal rate, regular rhythm, normal heart sounds and intact distal pulses.  Exam reveals no gallop and no friction rub.   No murmur heard. Pulmonary/Chest: Breath sounds normal. No respiratory distress. He has no wheezes. He has no rales.  Abdominal: Soft. Bowel sounds are normal. He exhibits no mass. There is no hepatosplenomegaly. There is no tenderness. There is no rebound, no guarding and no CVA tenderness.  Musculoskeletal: Normal range of motion. He exhibits no  edema or tenderness.  Lymphadenopathy:    He has no cervical adenopathy.  Neurological: He is alert and oriented to person, place, and time. He has normal sensation, normal strength, normal reflexes and intact cranial nerves. No cranial nerve deficit.  Skin: Skin is warm. No rash noted.  Psychiatric: Mood and affect normal.  Nursing note and vitals reviewed.     Assessment & Plan  Problem List Items Addressed This Visit      Cardiovascular and Mediastinum   Essential (primary) hypertension - Primary   Relevant Medications   benazepril-hydrochlorthiazide (LOTENSIN HCT) 10-12.5 MG tablet     Respiratory   Allergic rhinitis     Digestive   Barrett esophagus   Relevant Medications   pantoprazole (PROTONIX) 40 MG tablet     Other   HLD (hyperlipidemia)   Relevant Medications   benazepril-hydrochlorthiazide (LOTENSIN HCT) 10-12.5 MG tablet    Other Visit Diagnoses    Thoracic disc disease       Relevant Medications   etodolac (LODINE) 500 MG tablet   GERD without esophagitis       Relevant Medications   pantoprazole (PROTONIX) 40 MG tablet   Left low back pain, with sciatica presence unspecified       Relevant Medications   etodolac (  LODINE) 500 MG tablet   Seasonal allergic rhinitis due to pollen       Relevant Medications   mometasone (NASONEX) 50 MCG/ACT nasal spray   Flu vaccine need       Relevant Orders   Flu Vaccine QUAD 36+ mos PF IM (Fluarix & Fluzone Quad PF) (Completed)     I spent 25 minutes with this patient, More than 50% of that time was spent in face to face education, counseling and care coordination.   Dr. [facility]  08/17/16

## 2016-08-20 ENCOUNTER — Other Ambulatory Visit: Payer: Self-pay

## 2016-08-20 DIAGNOSIS — M545 Low back pain, unspecified: Secondary | ICD-10-CM

## 2016-08-20 MED ORDER — PREDNISONE 10 MG PO TABS: 10.0000 mg | ORAL_TABLET | Freq: Every day | ORAL | 0 refills | Status: DC

## 2016-09-03 ENCOUNTER — Ambulatory Visit (INDEPENDENT_AMBULATORY_CARE_PROVIDER_SITE_OTHER): Payer: Medicare Other | Admitting: Family Medicine

## 2016-09-03 ENCOUNTER — Encounter: Payer: Self-pay | Admitting: Family Medicine

## 2016-09-03 ENCOUNTER — Ambulatory Visit
Admission: RE | Admit: 2016-09-03 | Discharge: 2016-09-03 | Disposition: A | Discharge status: Home / Self Care | Payer: Medicare Other | Source: Ambulatory Visit | Attending: Family Medicine | Admitting: Family Medicine

## 2016-09-03 VITALS — BP 130/62 | HR 74 | Ht 67.0 in | Wt 193.0 lb

## 2016-09-03 DIAGNOSIS — R079 Chest pain, unspecified: Secondary | ICD-10-CM | POA: Diagnosis not present

## 2016-09-03 MED ORDER — NITROGLYCERIN 0.4 MG SL SUBL: 0.4000 mg | SUBLINGUAL_TABLET | SUBLINGUAL | 3 refills | Status: DC | PRN

## 2016-09-03 NOTE — Progress Notes (Signed)
Name: Timothy Hatfield   MRN: [id number]    DOB: [birth year]   Date:09/03/2016       Progress Note  Subjective  Chief Complaint  Chief Complaint  Patient presents with  . Chest Pain    shortness of breath on exertion- intermittent    Chest Pain   This is a new problem. The current episode started more than 1 month ago (6-7 weeks). The onset quality is sudden (duration cessation within 1 min of exertion stoppage). The problem occurs every several days. The problem has been waxing and waning. The pain is present in the substernal region. The pain is moderate. The quality of the pain is described as tightness. The pain radiates to the mid back. Associated symptoms include back pain, exertional chest pressure, headaches and shortness of breath. Pertinent negatives include no abdominal pain, claudication, cough, diaphoresis, dizziness, fever, hemoptysis, irregular heartbeat, leg pain, lower extremity edema, malaise/fatigue, nausea, near-syncope, numbness, orthopnea, palpitations, PND, sputum production, syncope, vomiting or weakness. The pain is aggravated by nothing. He has tried analgesics (asa) for the symptoms. The treatment provided mild relief. Risk factors include post-menopausal and male gender (dad bypass).  His past medical history is significant for hypertension.  Pertinent negatives for past medical history include no aneurysm, no anxiety/panic attacks, no aortic aneurysm, no aortic dissection, no arrhythmia, no bicuspid aortic valve, no CAD, no cancer, no congenital heart disease, no connective tissue disease, no COPD, no CHF, no diabetes, no DVT, no hyperhomocysteinemia, no hyperlipidemia, no Kawasaki disease, no PE, no PVD, no recent injury, no rheumatic fever, no seizures, no sickle cell disease, no sleep apnea, no thyroid problem, no TIA, Turner syndrome and no valve disorder.  His family medical history is significant for CAD and hypertension.  Pertinent negatives for family medical  history include: no aortic dissection, no connective tissue disease, no diabetes, no heart disease, no hyperlipidemia, no early MI, no PE, no PVD, no sickle cell disease, no stroke, no sudden death and no TIA.    No problem-specific Assessment & Plan notes found for this encounter.   Past Medical History:  Diagnosis Date  . Allergy   . GERD (gastroesophageal reflux disease)   . Hyperlipidemia   . Hypertension     Past Surgical History:  Procedure Laterality Date  . COLONOSCOPY  2013   normal/ Dr Vira Agar- cleared for 10 yrs  . HERNIA REPAIR    . kidney stones     lithotripsy  . UPPER GI ENDOSCOPY  2013    History reviewed. No pertinent family history.  Social History   Social History  . Marital status: Married    Spouse name: N/A  . Number of children: N/A  . Years of education: N/A   Occupational History  . Not on file.   Social History Main Topics  . Smoking status: Never Smoker  . Smokeless tobacco: Not on file  . Alcohol use No  . Drug use: No  . Sexual activity: No   Other Topics Concern  . Not on file   Social History Narrative  . No narrative on file    Allergies  Allergen Reactions  . Bee Venom     Has epi- pen on hand     Review of Systems  Constitutional: Negative for chills, diaphoresis, fever, malaise/fatigue and weight loss.  HENT: Negative for ear discharge, ear pain and sore throat.   Eyes: Negative for blurred vision.  Respiratory: Positive for shortness of breath. Negative for cough, hemoptysis, sputum  production and wheezing.   Cardiovascular: Positive for chest pain. Negative for palpitations, orthopnea, claudication, leg swelling, syncope, PND and near-syncope.  Gastrointestinal: Negative for abdominal pain, blood in stool, constipation, diarrhea, heartburn, melena, nausea and vomiting.  Genitourinary: Negative for dysuria, frequency, hematuria and urgency.  Musculoskeletal: Positive for back pain. Negative for joint pain, myalgias  and neck pain.  Skin: Negative for rash.  Neurological: Positive for headaches. Negative for dizziness, tingling, sensory change, focal weakness, seizures, weakness and numbness.  Endo/Heme/Allergies: Negative for environmental allergies and polydipsia. Does not bruise/bleed easily.  Psychiatric/Behavioral: Negative for depression and suicidal ideas. The patient is not nervous/anxious and does not have insomnia.      Objective  Vitals:   09/03/16 1429  BP: 130/62  Pulse: 74  Weight: 193 lb (87.5 kg)  Height: 5\' 7"  (1.702 m)    Physical Exam  Constitutional: He is oriented to person, place, and time and well-developed, well-nourished, and in no distress.  HENT:  Head: Normocephalic.  Right Ear: External ear normal.  Left Ear: External ear normal.  Nose: Nose normal.  Mouth/Throat: Oropharynx is clear and moist.  Eyes: Conjunctivae and EOM are normal. Pupils are equal, round, and reactive to light. Right eye exhibits no discharge. Left eye exhibits no discharge. No scleral icterus.  Neck: Normal range of motion. Neck supple. No JVD present. No tracheal deviation present. No thyromegaly present.  Cardiovascular: Normal rate, regular rhythm, normal heart sounds and intact distal pulses.  Exam reveals no gallop and no friction rub.   No murmur heard. Pulmonary/Chest: Breath sounds normal. No respiratory distress. He has no wheezes. He has no rales.  Abdominal: Soft. Bowel sounds are normal. He exhibits no mass. There is no hepatosplenomegaly. There is no tenderness. There is no rebound, no guarding and no CVA tenderness.  Musculoskeletal: Normal range of motion. He exhibits no edema or tenderness.  Lymphadenopathy:    He has no cervical adenopathy.  Neurological: He is alert and oriented to person, place, and time. He has normal sensation, normal strength and intact cranial nerves. No cranial nerve deficit.  Skin: Skin is warm. No rash noted.  Psychiatric: Mood and affect normal.   Nursing note and vitals reviewed.     Assessment & Plan  Problem List Items Addressed This Visit    None    Visit Diagnoses    Chest pain, unspecified type    -  Primary   Relevant Orders   EKG 12-Lead (Completed)   Ambulatory referral to Cardiology   DG Chest 2 View        Dr. Otilio Miu [facility]  09/03/16

## 2016-09-06 ENCOUNTER — Emergency Department
Admission: EM | Admit: 2016-09-06 | Discharge: 2016-09-06 | Disposition: A | Discharge status: Home / Self Care | Payer: Medicare Other | Attending: Emergency Medicine | Admitting: Emergency Medicine

## 2016-09-06 DIAGNOSIS — Z79899 Other long term (current) drug therapy: Secondary | ICD-10-CM | POA: Diagnosis not present

## 2016-09-06 DIAGNOSIS — N4889 Other specified disorders of penis: Secondary | ICD-10-CM | POA: Diagnosis present

## 2016-09-06 DIAGNOSIS — I10 Essential (primary) hypertension: Secondary | ICD-10-CM | POA: Insufficient documentation

## 2016-09-06 DIAGNOSIS — N211 Calculus in urethra: Secondary | ICD-10-CM | POA: Diagnosis not present

## 2016-09-06 HISTORY — DX: Disorder of kidney and ureter, unspecified: N28.9

## 2016-09-06 LAB — URINALYSIS COMPLETE WITH MICROSCOPIC (ARMC ONLY)
BACTERIA UA: NONE SEEN
SQUAMOUS EPITHELIAL / LPF: NONE SEEN
Specific Gravity, Urine: 1.01 (ref 1.005–1.030)

## 2016-09-06 MED ORDER — LIDOCAINE HCL 2 % EX GEL
1.0000 "application " | Freq: Once | CUTANEOUS | Status: AC
  Administered 2016-09-06: 1 via URETHRAL
  Filled 2016-09-06: qty 5

## 2016-09-06 NOTE — ED Notes (Signed)
Pt signature pad in room is not working at this time. Pt verbalized consent to DC statement.

## 2016-09-06 NOTE — ED Triage Notes (Signed)
Pt states he has a kidney stone stuck at the tip of his penis for the past 41min pta and is bleeding

## 2016-09-06 NOTE — ED Provider Notes (Signed)
[facility] Emergency Department Provider Note   ____________________________________________    I have reviewed the triage vital signs and the nursing notes.   HISTORY  Chief Complaint Nephrolithiasis     HPI Timothy Hatfield is a 71 y.o. male who presents with complaints of penile pain. Patient reports a long history of kidney stones and reports he was passing a kidney stone but it got stuck in the tip of his penis. He noted bleeding thereafter and pain when he moves in particular directions. This occurred today. He no longer has flank pain. No fevers or chills. No nausea or vomiting. This is never happened to him before. He has had multiple kidney stones in his life   Past Medical History:  Diagnosis Date  . Allergy   . GERD (gastroesophageal reflux disease)   . Hyperlipidemia   . Hypertension   . Renal disorder    kidney stones    Patient Active Problem List   Diagnosis Date Noted  . Acute calcific periarthritis 06/18/2015  . Allergic rhinitis 06/18/2015  . Arthritis 06/18/2015  . 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 Essential (primary) hypertension 06/18/2015  . 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 HLD (hyperlipidemia) 06/18/2015  . External hemorrhoid 06/18/2015  . Gastroenteritis 06/18/2015  . Calculus of kidney 05/15/2014    Past Surgical History:  Procedure Laterality Date  . COLONOSCOPY  2013   normal/ Dr Vira Agar- cleared for 10 yrs  . HERNIA REPAIR    . kidney stones     lithotripsy  . UPPER GI ENDOSCOPY  2013    Prior to Admission medications   Medication Sig Start Date End Date Taking? Authorizing Provider  benazepril-hydrochlorthiazide (LOTENSIN HCT) 10-12.5 MG tablet Take 1 tablet by mouth daily. 08/17/16   Juline Patch, MD  Dutasteride-Tamsulosin HCl (JALYN) 0.5-0.4 MG CAPS Take 1 capsule by mouth daily. Urology- [facility]    Historical Provider, MD  EPINEPHrine (EPIPEN 2-PAK) 0.3 mg/0.3 mL IJ SOAJ injection  Inject 0.3 mLs (0.3 mg total) into the muscle once. 06/05/15   Harvest Dark, MD  etodolac (LODINE) 500 MG tablet Take 1 tablet (500 mg total) by mouth 2 (two) times daily. 08/17/16   Juline Patch, MD  mometasone (NASONEX) 50 MCG/ACT nasal spray Place 2 sprays into the nose daily. 08/17/16   Juline Patch, MD  nitroGLYCERIN (NITROSTAT) 0.4 MG SL tablet Place 1 tablet (0.4 mg total) under the tongue every 5 (five) minutes as needed for chest pain. 09/03/16   Juline Patch, MD  pantoprazole (PROTONIX) 40 MG tablet Take 1 tablet (40 mg total) by mouth daily. 08/17/16   Juline Patch, MD  predniSONE (DELTASONE) 10 MG tablet Take 1 tablet (10 mg total) by mouth daily with breakfast. Taper dose- 444, 333, 222, 111 Patient not taking: Reported on 09/03/2016 08/20/16   Juline Patch, MD     Allergies Bee venom  No family history on file.  Social History Social History  Substance Use Topics  . Smoking status: Never Smoker  . Smokeless tobacco: Never Used  . Alcohol use No    Review of Systems  Constitutional: No fever/chills  Gastrointestinal: No abdominal pain.   Genitourinary: As above  Skin: Negative for rash.   10-point ROS otherwise negative.  ____________________________________________   PHYSICAL EXAM:  VITAL SIGNS: ED Triage Vitals  Enc Vitals Group     BP 09/06/16 1647 139/66     Pulse Rate 09/06/16 1647 86  Resp 09/06/16 1647 17     Temp 09/06/16 1647 98.3 F (36.8 C)     Temp Source 09/06/16 1647 Oral     SpO2 09/06/16 1647 96 %     Weight 09/06/16 1648 193 lb (87.5 kg)     Height 09/06/16 1648 5\' 7"  (1.702 m)     Head Circumference --      Peak Flow --      Pain Score --      Pain Loc --      Pain Edu? --      Excl. in [place]? --     Constitutional: Alert and oriented. No acute distress. Pleasant and interactive   Cardiovascular: Normal rate, regular rhythm.  Good peripheral circulation. Respiratory: Normal respiratory effort.  No retractions.  Lungs CTAB.  Genitourinary: Small amount of bleeding from the urethra, palpable stone approximately 1 cm proximal to the meatus, no swelling or bruising  Neurologic:  Normal speech and language. No gross focal neurologic deficits are appreciated.  Skin:  Skin is warm, dry and intact. No rash noted. Psychiatric: Mood and affect are normal. Speech and behavior are normal.  ____________________________________________   LABS (all labs ordered are listed, but only abnormal results are displayed)  Labs Reviewed  URINALYSIS COMPLETEWITH MICROSCOPIC ([place])   ____________________________________________  EKG  None ____________________________________________  RADIOLOGY  None ____________________________________________   PROCEDURES  Procedure(s) performed: No    Critical Care performed: No ____________________________________________   INITIAL IMPRESSION / ASSESSMENT AND PLAN / ED COURSE  Pertinent labs & imaging results that were available during my care of the patient were reviewed by me and considered in my medical decision making (see chart for details).  Discussed with Dr. Erlene Quan of urology, who recommends Urojet and milking the stone out and if necessary using forceps.  Clinical Course  Patient had good resolution of pain with the Urojet, I "milked" the penis without evidence of the stone being dislodged. I suspect Urojet dislodge the stone approximately, we will have the patient drink and try to urinate out a stone. ____________________________________________   FINAL CLINICAL IMPRESSION(S) / ED DIAGNOSES  Final diagnoses:  Urethral stone      NEW MEDICATIONS STARTED DURING THIS VISIT:  Discharge Medication List as of 09/06/2016  7:09 PM       Note:  This document was prepared using Dragon voice recognition software and may include unintentional dictation errors.    Lavonia Drafts, MD 09/06/16 [id number]

## 2016-09-09 ENCOUNTER — Ambulatory Visit
Admission: RE | Admit: 2016-09-09 | Discharge: 2016-09-09 | Disposition: A | Discharge status: Home / Self Care | Payer: Medicare Other | Source: Ambulatory Visit | Attending: Internal Medicine | Admitting: Internal Medicine

## 2016-09-09 ENCOUNTER — Encounter
Admission: RE | Disposition: A | Discharge status: Home / Self Care | Payer: Self-pay | Source: Ambulatory Visit | Attending: Internal Medicine

## 2016-09-09 DIAGNOSIS — Z79899 Other long term (current) drug therapy: Secondary | ICD-10-CM | POA: Insufficient documentation

## 2016-09-09 DIAGNOSIS — Z91030 Bee allergy status: Secondary | ICD-10-CM | POA: Insufficient documentation

## 2016-09-09 DIAGNOSIS — I200 Unstable angina: Secondary | ICD-10-CM | POA: Diagnosis present

## 2016-09-09 DIAGNOSIS — I340 Nonrheumatic mitral (valve) insufficiency: Secondary | ICD-10-CM | POA: Diagnosis not present

## 2016-09-09 DIAGNOSIS — Z7982 Long term (current) use of aspirin: Secondary | ICD-10-CM | POA: Insufficient documentation

## 2016-09-09 DIAGNOSIS — I25110 Atherosclerotic heart disease of native coronary artery with unstable angina pectoris: Secondary | ICD-10-CM | POA: Diagnosis not present

## 2016-09-09 DIAGNOSIS — N400 Enlarged prostate without lower urinary tract symptoms: Secondary | ICD-10-CM | POA: Insufficient documentation

## 2016-09-09 DIAGNOSIS — Z791 Long term (current) use of non-steroidal anti-inflammatories (NSAID): Secondary | ICD-10-CM | POA: Insufficient documentation

## 2016-09-09 DIAGNOSIS — Z9889 Other specified postprocedural states: Secondary | ICD-10-CM | POA: Insufficient documentation

## 2016-09-09 DIAGNOSIS — K219 Gastro-esophageal reflux disease without esophagitis: Secondary | ICD-10-CM | POA: Insufficient documentation

## 2016-09-09 DIAGNOSIS — I2510 Atherosclerotic heart disease of native coronary artery without angina pectoris: Secondary | ICD-10-CM

## 2016-09-09 HISTORY — DX: Atherosclerotic heart disease of native coronary artery without angina pectoris: I25.10

## 2016-09-09 HISTORY — PX: CARDIAC CATHETERIZATION: SHX172

## 2016-09-09 LAB — CARDIAC CATHETERIZATION: Cath EF Quantitative: 50 %

## 2016-09-09 SURGERY — LEFT HEART CATH AND CORONARY ANGIOGRAPHY
Anesthesia: Moderate Sedation | Laterality: Left

## 2016-09-09 MED ORDER — MIDAZOLAM HCL 2 MG/2ML IJ SOLN
INTRAMUSCULAR | Status: AC
  Filled 2016-09-09: qty 2

## 2016-09-09 MED ORDER — FENTANYL CITRATE (PF) 100 MCG/2ML IJ SOLN
INTRAMUSCULAR | Status: DC | PRN
  Administered 2016-09-09: 25 ug via INTRAVENOUS
  Administered 2016-09-09: 50 ug via INTRAVENOUS

## 2016-09-09 MED ORDER — SODIUM CHLORIDE 0.9 % IV SOLN: 250.0000 mL | INTRAVENOUS | Status: DC | PRN

## 2016-09-09 MED ORDER — SODIUM CHLORIDE 0.9% FLUSH: 3.0000 mL | Freq: Two times a day (BID) | INTRAVENOUS | Status: DC

## 2016-09-09 MED ORDER — MIDAZOLAM HCL 2 MG/2ML IJ SOLN
INTRAMUSCULAR | Status: DC | PRN
  Administered 2016-09-09: 0.5 mg via INTRAVENOUS
  Administered 2016-09-09: 1 mg via INTRAVENOUS

## 2016-09-09 MED ORDER — HEPARIN (PORCINE) IN NACL 2-0.9 UNIT/ML-% IJ SOLN
INTRAMUSCULAR | Status: AC
Start: 2016-09-09 — End: 2016-09-09
  Filled 2016-09-09: qty 500

## 2016-09-09 MED ORDER — SODIUM CHLORIDE 0.9 % WEIGHT BASED INFUSION: 3.0000 mL/kg/h | INTRAVENOUS | Status: DC

## 2016-09-09 MED ORDER — ACETAMINOPHEN 325 MG PO TABS: 650.0000 mg | ORAL_TABLET | ORAL | Status: DC | PRN

## 2016-09-09 MED ORDER — LABETALOL HCL 5 MG/ML IV SOLN
INTRAVENOUS | Status: AC
  Filled 2016-09-09: qty 4

## 2016-09-09 MED ORDER — FENTANYL CITRATE (PF) 100 MCG/2ML IJ SOLN
INTRAMUSCULAR | Status: AC
  Filled 2016-09-09: qty 2

## 2016-09-09 MED ORDER — IOPAMIDOL (ISOVUE-300) INJECTION 61%
INTRAVENOUS | Status: DC | PRN
  Administered 2016-09-09: 130 mL via INTRA_ARTERIAL

## 2016-09-09 MED ORDER — SODIUM CHLORIDE 0.9% FLUSH: 3.0000 mL | INTRAVENOUS | Status: DC | PRN

## 2016-09-09 MED ORDER — ONDANSETRON HCL 4 MG/2ML IJ SOLN: 4.0000 mg | Freq: Four times a day (QID) | INTRAMUSCULAR | Status: DC | PRN

## 2016-09-09 SURGICAL SUPPLY — 9 items
CATH 5FR JL4 DIAGNOSTIC (CATHETERS) ×2 IMPLANT
CATH 5FR PIGTAIL DIAGNOSTIC (CATHETERS) ×2 IMPLANT
CATH INFINITI JR4 5F (CATHETERS) ×2 IMPLANT
DEVICE CLOSURE MYNXGRIP 5F (Vascular Products) ×2 IMPLANT
KIT MANI 3VAL PERCEP (MISCELLANEOUS) ×2 IMPLANT
NEEDLE PERC 18GX7CM (NEEDLE) ×2 IMPLANT
PACK CARDIAC CATH (CUSTOM PROCEDURE TRAY) ×2 IMPLANT
SHEATH AVANTI 5FR X 11CM (SHEATH) ×2 IMPLANT
WIRE EMERALD 3MM-J .035X150CM (WIRE) ×2 IMPLANT

## 2016-09-09 NOTE — Discharge Instructions (Signed)
Angiogram, Care After °Refer to this sheet in the next few weeks. These instructions provide you with information about caring for yourself after your procedure. Your health care provider may also give you more specific instructions. Your treatment has been planned according to current medical practices, but problems sometimes occur. Call your health care provider if you have any problems or questions after your procedure. °WHAT TO EXPECT AFTER THE PROCEDURE °After your procedure, it is typical to have the following: °· Bruising at the catheter insertion site that usually fades within 1-2 weeks. °· Blood collecting in the tissue (hematoma) that may be painful to the touch. It should usually decrease in size and tenderness within 1-2 weeks. °HOME CARE INSTRUCTIONS °· Take medicines only as directed by your health care provider. °· You may shower 24-48 hours after the procedure or as directed by your health care provider. Remove the bandage (dressing) and gently wash the site with plain soap and water. Pat the area dry with a clean towel. Do not rub the site, because this may cause bleeding. °· Do not take baths, swim, or use a hot tub until your health care provider approves. °· Check your insertion site every day for redness, swelling, or drainage. °· Do not apply powder or lotion to the site. °· Do not lift over 10 lb (4.5 kg) for 5 days after your procedure or as directed by your health care provider. °· Ask your health care provider when it is okay to: °¨ Return to work or school. °¨ Resume usual physical activities or sports. °¨ Resume sexual activity. °· Do not drive home if you are discharged the same day as the procedure. Have someone else drive you. °· You may drive 24 hours after the procedure unless otherwise instructed by your health care provider. °· Do not operate machinery or power tools for 24 hours after the procedure or as directed by your health care provider. °· If your procedure was done as an  outpatient procedure, which means that you went home the same day as your procedure, a responsible adult should be with you for the first 24 hours after you arrive home. °· Keep all follow-up visits as directed by your health care provider. This is important. °SEEK MEDICAL CARE IF: °· You have a fever. °· You have chills. °· You have increased bleeding from the catheter insertion site. Hold pressure on the site. °SEEK IMMEDIATE MEDICAL CARE IF: °· You have unusual pain at the catheter insertion site. °· You have redness, warmth, or swelling at the catheter insertion site. °· You have drainage (other than a small amount of blood on the dressing) from the catheter insertion site. °· The catheter insertion site is bleeding, and the bleeding does not stop after 30 minutes of holding steady pressure on the site. °· The area near or just beyond the catheter insertion site becomes pale, cool, tingly, or numb. °  °This information is not intended to replace advice given to you by your health care provider. Make sure you discuss any questions you have with your health care provider. °  °Document Released: 05/28/2005 Document Revised: 11/30/2014 Document Reviewed: 04/12/2013 °Elsevier Interactive Patient Education ©2016 [employer] ° °

## 2016-09-10 ENCOUNTER — Encounter: Payer: Self-pay | Admitting: Internal Medicine

## 2016-09-14 DIAGNOSIS — I25110 Atherosclerotic heart disease of native coronary artery with unstable angina pectoris: Secondary | ICD-10-CM | POA: Insufficient documentation

## 2016-10-02 DIAGNOSIS — Z951 Presence of aortocoronary bypass graft: Secondary | ICD-10-CM

## 2016-10-02 HISTORY — PX: CORONARY ARTERY BYPASS GRAFT: SHX141

## 2016-10-02 HISTORY — DX: Presence of aortocoronary bypass graft: Z95.1

## 2016-10-03 DIAGNOSIS — Z951 Presence of aortocoronary bypass graft: Secondary | ICD-10-CM | POA: Insufficient documentation

## 2016-10-13 ENCOUNTER — Ambulatory Visit
Admission: RE | Admit: 2016-10-13 | Discharge: 2016-10-13 | Disposition: A | Discharge status: Home / Self Care | Payer: Medicare Other | Source: Ambulatory Visit | Attending: Family Medicine | Admitting: Family Medicine

## 2016-10-13 ENCOUNTER — Other Ambulatory Visit
Admission: RE | Admit: 2016-10-13 | Discharge: 2016-10-13 | Disposition: A | Discharge status: Home / Self Care | Payer: Medicare Other | Source: Ambulatory Visit | Attending: Family Medicine | Admitting: Family Medicine

## 2016-10-13 ENCOUNTER — Ambulatory Visit (INDEPENDENT_AMBULATORY_CARE_PROVIDER_SITE_OTHER): Payer: Medicare Other | Admitting: Family Medicine

## 2016-10-13 ENCOUNTER — Encounter: Payer: Self-pay | Admitting: Family Medicine

## 2016-10-13 VITALS — BP 142/62 | HR 81 | Wt 195.0 lb

## 2016-10-13 DIAGNOSIS — F419 Anxiety disorder, unspecified: Secondary | ICD-10-CM | POA: Diagnosis not present

## 2016-10-13 DIAGNOSIS — Z87898 Personal history of other specified conditions: Secondary | ICD-10-CM | POA: Diagnosis not present

## 2016-10-13 DIAGNOSIS — I517 Cardiomegaly: Secondary | ICD-10-CM | POA: Insufficient documentation

## 2016-10-13 DIAGNOSIS — Z951 Presence of aortocoronary bypass graft: Secondary | ICD-10-CM | POA: Diagnosis not present

## 2016-10-13 DIAGNOSIS — J90 Pleural effusion, not elsewhere classified: Secondary | ICD-10-CM | POA: Insufficient documentation

## 2016-10-13 LAB — COMPREHENSIVE METABOLIC PANEL
ALT: 18 U/L (ref 17–63)
ANION GAP: 10 (ref 5–15)
AST: 21 U/L (ref 15–41)
Albumin: 3.4 g/dL — ABNORMAL LOW (ref 3.5–5.0)
Alkaline Phosphatase: 72 U/L (ref 38–126)
BUN: 13 mg/dL (ref 6–20)
CHLORIDE: 100 mmol/L — AB (ref 101–111)
CO2: 27 mmol/L (ref 22–32)
CREATININE: 1.15 mg/dL (ref 0.61–1.24)
Calcium: 8.6 mg/dL — ABNORMAL LOW (ref 8.9–10.3)
Glucose, Bld: 156 mg/dL — ABNORMAL HIGH (ref 65–99)
Potassium: 4.5 mmol/L (ref 3.5–5.1)
Sodium: 137 mmol/L (ref 135–145)
Total Bilirubin: 0.9 mg/dL (ref 0.3–1.2)
Total Protein: 7.2 g/dL (ref 6.5–8.1)

## 2016-10-13 LAB — POCT URINALYSIS DIPSTICK
Bilirubin, UA: NEGATIVE
GLUCOSE UA: NEGATIVE
Ketones, UA: NEGATIVE
Leukocytes, UA: NEGATIVE
NITRITE UA: NEGATIVE
PROTEIN UA: NEGATIVE
SPEC GRAV UA: 1.015
UROBILINOGEN UA: 0.2
pH, UA: 5

## 2016-10-13 LAB — CBC WITH DIFFERENTIAL/PLATELET
Basophils Absolute: 0.1 10*3/uL (ref 0–0.1)
Basophils Relative: 1 %
EOS ABS: 0.1 10*3/uL (ref 0–0.7)
Eosinophils Relative: 1 %
HCT: 32.7 % — ABNORMAL LOW (ref 40.0–52.0)
Hemoglobin: 10.8 g/dL — ABNORMAL LOW (ref 13.0–18.0)
LYMPHS ABS: 1.5 10*3/uL (ref 1.0–3.6)
Lymphocytes Relative: 9 %
MCH: 31.1 pg (ref 26.0–34.0)
MCHC: 32.9 g/dL (ref 32.0–36.0)
MCV: 94.4 fL (ref 80.0–100.0)
MONO ABS: 0.9 10*3/uL (ref 0.2–1.0)
Monocytes Relative: 6 %
Neutro Abs: 13.8 10*3/uL — ABNORMAL HIGH (ref 1.4–6.5)
Neutrophils Relative %: 83 %
PLATELETS: 475 10*3/uL — AB (ref 150–440)
RBC: 3.46 MIL/uL — AB (ref 4.40–5.90)
RDW: 14.7 % — ABNORMAL HIGH (ref 11.5–14.5)
WBC: 16.5 10*3/uL — AB (ref 3.8–10.6)

## 2016-10-13 MED ORDER — ALPRAZOLAM 0.25 MG PO TABS: 0.2500 mg | ORAL_TABLET | Freq: Two times a day (BID) | ORAL | 0 refills | Status: DC | PRN

## 2016-10-13 MED ORDER — LEVOFLOXACIN 500 MG PO TABS: 500.0000 mg | ORAL_TABLET | Freq: Every day | ORAL | 0 refills | Status: DC

## 2016-10-13 NOTE — Addendum Note (Signed)
Addended by: Juline Patch on: 10/13/2016 04:45 PM   Modules accepted: Orders

## 2016-10-13 NOTE — Progress Notes (Signed)
Name: Timothy Hatfield   MRN: [id number]    DOB: 1946/11/12   Date:10/13/2016       Progress Note  Subjective  Chief Complaint  Chief Complaint  Patient presents with  . Sinusitis    Sinusitis  This is a recurrent problem. The current episode started in the past 7 days. The problem has been gradually worsening since onset. There has been no fever. The pain is mild. Associated symptoms include sinus pressure. Pertinent negatives include no chills, congestion, coughing, diaphoresis, ear pain, headaches, hoarse voice, neck pain, shortness of breath, sneezing, sore throat or swollen glands. The treatment provided mild relief.  Anxiety  Presents for follow-up visit. Symptoms include irritability and restlessness. Patient reports no chest pain, compulsions, confusion, decreased concentration, depressed mood, dizziness, dry mouth, excessive worry, feeling of choking, hyperventilation, impotence, insomnia, malaise, muscle tension, nausea, nervous/anxious behavior, obsessions, palpitations, panic, shortness of breath or suicidal ideas. Symptoms occur most days. The quality of sleep is good. Nighttime awakenings: none.      No problem-specific Assessment & Plan notes found for this encounter.   Past Medical History:  Diagnosis Date  . Allergy   . GERD (gastroesophageal reflux disease)   . Hyperlipidemia   . Hypertension   . Renal disorder    kidney stones    Past Surgical History:  Procedure Laterality Date  . CARDIAC CATHETERIZATION Left 09/09/2016   Procedure: Left Heart Cath and Coronary Angiography;  Surgeon: Yolonda Kida, MD;  Location: [place] [facility];  Service: Cardiovascular;  Laterality: Left;  . COLONOSCOPY  2013   normal/ Dr Vira Agar- cleared for 10 yrs  . HERNIA REPAIR    . kidney stones     lithotripsy  . UPPER GI ENDOSCOPY  2013    History reviewed. No pertinent family history.  Social History   Social History  . Marital status: Married    Spouse name:  N/A  . Number of children: N/A  . Years of education: N/A   Occupational History  . Not on file.   Social History Main Topics  . Smoking status: Never Smoker  . Smokeless tobacco: Never Used  . Alcohol use No  . Drug use: No  . Sexual activity: No   Other Topics Concern  . Not on file   Social History Narrative  . No narrative on file    Allergies  Allergen Reactions  . Bee Venom     Has epi- pen on hand     Review of Systems  Constitutional: Positive for irritability. Negative for chills, diaphoresis, fever, malaise/fatigue and weight loss.  HENT: Positive for sinus pressure. Negative for congestion, ear discharge, ear pain, hoarse voice, sneezing and sore throat.   Eyes: Negative for blurred vision.  Respiratory: Negative for cough, sputum production, shortness of breath and wheezing.   Cardiovascular: Negative for chest pain, palpitations and leg swelling.  Gastrointestinal: Negative for abdominal pain, blood in stool, constipation, diarrhea, heartburn, melena and nausea.  Genitourinary: Negative for dysuria, frequency, hematuria, impotence and urgency.  Musculoskeletal: Negative for back pain, joint pain, myalgias and neck pain.  Skin: Negative for rash.  Neurological: Negative for dizziness, tingling, sensory change, focal weakness and headaches.  Endo/Heme/Allergies: Negative for environmental allergies and polydipsia. Does not bruise/bleed easily.  Psychiatric/Behavioral: Negative for confusion, decreased concentration, depression and suicidal ideas. The patient is not nervous/anxious and does not have insomnia.      Objective  Vitals:   10/13/16 1521  BP: (!) 142/62  Pulse: 81  SpO2: 97%  Weight: 195 lb (88.5 kg)    Physical Exam  Constitutional: He is oriented to person, place, and time and well-developed, well-nourished, and in no distress.  HENT:  Head: Normocephalic.  Right Ear: External ear normal.  Left Ear: External ear normal.  Nose: Nose  normal.  Mouth/Throat: Oropharynx is clear and moist.  Eyes: Conjunctivae and EOM are normal. Pupils are equal, round, and reactive to light. Right eye exhibits no discharge. Left eye exhibits no discharge. No scleral icterus.  Neck: Normal range of motion. Neck supple. No JVD present. No tracheal deviation present. No thyromegaly present.  Cardiovascular: Normal rate, regular rhythm, normal heart sounds and intact distal pulses.  Exam reveals no gallop and no friction rub.   No murmur heard. Pulmonary/Chest: Breath sounds normal. No respiratory distress. He has no wheezes. He has no rales.  Abdominal: Soft. Bowel sounds are normal. He exhibits no mass. There is no hepatosplenomegaly. There is no tenderness. There is no rebound, no guarding and no CVA tenderness.  Musculoskeletal: Normal range of motion. He exhibits no edema or tenderness.  Lymphadenopathy:    He has no cervical adenopathy.  Neurological: He is alert and oriented to person, place, and time. He has normal sensation, normal strength, normal reflexes and intact cranial nerves. No cranial nerve deficit.  Skin: Skin is warm. No rash noted.  Psychiatric: Mood and affect normal.  Nursing note and vitals reviewed.     Assessment & Plan  Problem List Items Addressed This Visit    None    Visit Diagnoses    Acute anxiety    -  Primary   Relevant Orders   DG Chest 2 View        Dr. Otilio Miu [facility]  10/13/16

## 2016-10-13 NOTE — Progress Notes (Signed)
Name: Timothy Hatfield   MRN: [id number]    DOB: 1946-09-28   Date:10/13/2016       Progress Note  Subjective  Chief Complaint  Chief Complaint  Patient presents with  . Sinusitis    HPI  No problem-specific Assessment & Plan notes found for this encounter.   Past Medical History:  Diagnosis Date  . Allergy   . GERD (gastroesophageal reflux disease)   . Hyperlipidemia   . Hypertension   . Renal disorder    kidney stones    Past Surgical History:  Procedure Laterality Date  . CARDIAC CATHETERIZATION Left 09/09/2016   Procedure: Left Heart Cath and Coronary Angiography;  Surgeon: Yolonda Kida, MD;  Location: [place] [facility];  Service: Cardiovascular;  Laterality: Left;  . COLONOSCOPY  2013   normal/ Dr Vira Agar- cleared for 10 yrs  . HERNIA REPAIR    . kidney stones     lithotripsy  . UPPER GI ENDOSCOPY  2013    History reviewed. No pertinent family history.  Social History   Social History  . Marital status: Married    Spouse name: N/A  . Number of children: N/A  . Years of education: N/A   Occupational History  . Not on file.   Social History Main Topics  . Smoking status: Never Smoker  . Smokeless tobacco: Never Used  . Alcohol use No  . Drug use: No  . Sexual activity: No   Other Topics Concern  . Not on file   Social History Narrative  . No narrative on file    Allergies  Allergen Reactions  . Bee Venom     Has epi- pen on hand     ROS   Objective  Vitals:   10/13/16 1521  BP: (!) 142/62  Pulse: 81  SpO2: 97%  Weight: 195 lb (88.5 kg)    Physical Exam    Assessment & Plan  Problem List Items Addressed This Visit    None        Dr. [facility]  10/13/16

## 2016-10-13 NOTE — Addendum Note (Signed)
Addended by: Juline Patch on: 10/13/2016 04:51 PM   Modules accepted: Orders

## 2016-10-14 ENCOUNTER — Other Ambulatory Visit: Payer: Self-pay

## 2016-10-20 ENCOUNTER — Other Ambulatory Visit: Payer: Self-pay

## 2016-11-27 ENCOUNTER — Ambulatory Visit
Admission: RE | Admit: 2016-11-27 | Discharge: 2016-11-27 | Disposition: A | Discharge status: Home / Self Care | Payer: Medicare Other | Source: Ambulatory Visit | Attending: Family Medicine | Admitting: Family Medicine

## 2016-11-27 ENCOUNTER — Ambulatory Visit (INDEPENDENT_AMBULATORY_CARE_PROVIDER_SITE_OTHER): Payer: Medicare Other | Admitting: Family Medicine

## 2016-11-27 ENCOUNTER — Encounter: Payer: Self-pay | Admitting: Family Medicine

## 2016-11-27 VITALS — BP 124/60 | HR 96 | Temp 98.9°F | Ht 67.0 in | Wt 185.0 lb

## 2016-11-27 DIAGNOSIS — Z951 Presence of aortocoronary bypass graft: Secondary | ICD-10-CM | POA: Diagnosis not present

## 2016-11-27 DIAGNOSIS — R938 Abnormal findings on diagnostic imaging of other specified body structures: Secondary | ICD-10-CM | POA: Diagnosis not present

## 2016-11-27 DIAGNOSIS — J0100 Acute maxillary sinusitis, unspecified: Secondary | ICD-10-CM | POA: Diagnosis not present

## 2016-11-27 DIAGNOSIS — R9389 Abnormal findings on diagnostic imaging of other specified body structures: Secondary | ICD-10-CM

## 2016-11-27 MED ORDER — AZITHROMYCIN 250 MG PO TABS: ORAL_TABLET | ORAL | 0 refills | Status: DC

## 2016-11-27 NOTE — Progress Notes (Signed)
Name: Timothy Hatfield   MRN: [id number]    DOB: [birth year]   Date:11/27/2016       Progress Note  Subjective  Chief Complaint  Chief Complaint  Patient presents with  . Headache    body aches feels "swimmy headed"- started with dizziness. No cough or cong. Some sneezing    Headache   This is a new problem. The current episode started in the past 7 days. The problem occurs intermittently. The problem has been waxing and waning. The pain is located in the vertex region. The pain radiates to the upper back. The pain quality is similar to prior headaches. The quality of the pain is described as aching. The pain is mild. Associated symptoms include back pain and sinus pressure. Pertinent negatives include no abdominal pain, blurred vision, coughing, dizziness, ear pain, fever, insomnia, nausea, neck pain, seizures, sore throat, swollen glands, tingling or weight loss. Nothing aggravates the symptoms. He has tried acetaminophen for the symptoms. The treatment provided mild relief.    No problem-specific Assessment & Plan notes found for this encounter.   Past Medical History:  Diagnosis Date  . Allergy   . GERD (gastroesophageal reflux disease)   . Hyperlipidemia   . Hypertension   . Renal disorder    kidney stones    Past Surgical History:  Procedure Laterality Date  . CARDIAC CATHETERIZATION Left 09/09/2016   Procedure: Left Heart Cath and Coronary Angiography;  Surgeon: Yolonda Kida, MD;  Location: [facility];  Service: Cardiovascular;  Laterality: Left;  . COLONOSCOPY  2013   normal/ Dr Vira Agar- cleared for 10 yrs  . HERNIA REPAIR    . kidney stones     lithotripsy  . UPPER GI ENDOSCOPY  2013    No family history on file.  Social History   Social History  . Marital status: Married    Spouse name: N/A  . Number of children: N/A  . Years of education: N/A   Occupational History  . Not on file.   Social History Main Topics  . Smoking status: Never  Smoker  . Smokeless tobacco: Never Used  . Alcohol use No  . Drug use: No  . Sexual activity: No   Other Topics Concern  . Not on file   Social History Narrative  . No narrative on file    Allergies  Allergen Reactions  . Bee Venom     Has epi- pen on hand     Review of Systems  Constitutional: Positive for malaise/fatigue. Negative for chills, fever and weight loss.  HENT: Positive for congestion and sinus pressure. Negative for ear discharge, ear pain and sore throat.   Eyes: Negative for blurred vision.  Respiratory: Negative for cough, sputum production, shortness of breath and wheezing.   Cardiovascular: Negative for chest pain, palpitations and leg swelling.  Gastrointestinal: Negative for abdominal pain, blood in stool, constipation, diarrhea, heartburn, melena and nausea.  Genitourinary: Negative for dysuria, frequency, hematuria and urgency.  Musculoskeletal: Positive for back pain. Negative for joint pain, myalgias and neck pain.  Skin: Negative for rash.  Neurological: Positive for headaches. Negative for dizziness, tingling, sensory change, focal weakness and seizures.  Endo/Heme/Allergies: Negative for environmental allergies and polydipsia. Does not bruise/bleed easily.  Psychiatric/Behavioral: Negative for depression and suicidal ideas. The patient is not nervous/anxious and does not have insomnia.      Objective  Vitals:   11/27/16 1518  BP: 124/60  Pulse: 96  Temp: 98.9 F (37.2  C)  TempSrc: Oral  Weight: 185 lb (83.9 kg)  Height: 5\' 7"  (1.702 m)    Physical Exam  Constitutional: He is oriented to person, place, and time and well-developed, well-nourished, and in no distress.  HENT:  Head: Normocephalic.  Right Ear: External ear normal.  Left Ear: External ear normal.  Nose: Nose normal.  Mouth/Throat: Oropharynx is clear and moist.  Eyes: Conjunctivae and EOM are normal. Pupils are equal, round, and reactive to light. Right eye exhibits no  discharge. Left eye exhibits no discharge. No scleral icterus.  Neck: Normal range of motion. Neck supple. No JVD present. No tracheal deviation present. No thyromegaly present.  Cardiovascular: Normal rate, regular rhythm, normal heart sounds and intact distal pulses.  Exam reveals no gallop and no friction rub.   No murmur heard. Pulmonary/Chest: Breath sounds normal. No respiratory distress. He has no wheezes. He has no rales.  Abdominal: Soft. Bowel sounds are normal. He exhibits no mass. There is no hepatosplenomegaly. There is no tenderness. There is no rebound, no guarding and no CVA tenderness.  Musculoskeletal: Normal range of motion. He exhibits no edema or tenderness.  Lymphadenopathy:    He has no cervical adenopathy.  Neurological: He is alert and oriented to person, place, and time. He has normal sensation, normal strength, normal reflexes and intact cranial nerves. No cranial nerve deficit.  Skin: Skin is warm. No rash noted.  Psychiatric: Mood and affect normal.  Nursing note and vitals reviewed.     Assessment & Plan  Problem List Items Addressed This Visit    None    Visit Diagnoses    Acute maxillary sinusitis, recurrence not specified    -  Primary   Relevant Medications   azithromycin (ZITHROMAX) 250 MG tablet   Abnormal chest x-ray       Relevant Orders   DG Chest 2 View        Dr. Otilio Miu [facility]  11/27/16

## 2016-12-14 ENCOUNTER — Other Ambulatory Visit: Payer: Self-pay

## 2017-01-28 ENCOUNTER — Ambulatory Visit
Admission: RE | Admit: 2017-01-28 | Discharge: 2017-01-28 | Disposition: A | Discharge status: Home / Self Care | Payer: Medicare Other | Source: Ambulatory Visit | Attending: Family Medicine | Admitting: Family Medicine

## 2017-01-28 ENCOUNTER — Encounter: Payer: Self-pay | Admitting: Family Medicine

## 2017-01-28 ENCOUNTER — Ambulatory Visit (INDEPENDENT_AMBULATORY_CARE_PROVIDER_SITE_OTHER): Payer: Medicare Other | Admitting: Family Medicine

## 2017-01-28 VITALS — BP 138/72 | HR 80 | Ht 67.0 in | Wt 189.0 lb

## 2017-01-28 DIAGNOSIS — M5412 Radiculopathy, cervical region: Secondary | ICD-10-CM | POA: Diagnosis not present

## 2017-01-28 DIAGNOSIS — M5090 Cervical disc disorder, unspecified, unspecified cervical region: Secondary | ICD-10-CM

## 2017-01-28 DIAGNOSIS — M720 Palmar fascial fibromatosis [Dupuytren]: Secondary | ICD-10-CM | POA: Diagnosis not present

## 2017-01-28 DIAGNOSIS — M47892 Other spondylosis, cervical region: Secondary | ICD-10-CM | POA: Diagnosis not present

## 2017-01-28 DIAGNOSIS — M4802 Spinal stenosis, cervical region: Secondary | ICD-10-CM | POA: Insufficient documentation

## 2017-01-28 MED ORDER — ETODOLAC 400 MG PO TABS: 400.0000 mg | ORAL_TABLET | Freq: Two times a day (BID) | ORAL | 3 refills | Status: DC

## 2017-01-28 MED ORDER — PREDNISONE 10 MG PO TABS: 10.0000 mg | ORAL_TABLET | Freq: Every day | ORAL | 0 refills | Status: DC

## 2017-01-28 NOTE — Patient Instructions (Signed)
Dupuytren Contracture Dupuytren contracture is a condition in which tissue under the skin of the palm becomes abnormally thickened. This causes one or more of the fingers to curl inward (contract) toward the palm. Eventually, the fingers may not be able to straighten out. This condition affects some or all of the fingers and the palm of the hand. It is often passed along from parent to child (inherited). Dupuytren contracture is a long-term (chronic) condition that develops (progresses) slowly over time. There is no cure, but symptoms can be managed and progression can be slowed with treatment. This condition is usually not dangerous or painful, but it can interfere with everyday tasks. What are the causes? This condition is caused by tissue (fascia) in the palm getting thicker and tighter. When the fascia thickens, it pulls on the cords of tissue (tendons) that control finger movement. This causes the fingers to contract. The cause of fascia thickening is not known. What increases the risk? This condition may be more likely to develop in:  People who are age 9 or older.  Men.  People with a family history of this condition.  People who use tobacco products, including cigarettes, chewing tobacco, and e-cigarettes.  People who drink alcohol excessively.  People with diabetes.  People with autoimmune diseases, such as HIV.  People with seizure disorders. What are the signs or symptoms? Symptoms may develop in one or both hands. Any of the fingers can contract. The fingers farthest from the thumb are commonly affected. Usually, this condition is painless. You may have discomfort when holding or grabbing objects. Early symptoms of this condition may include:  Thick, puckered skin on the hand.  One or more lumps (nodules) on the palm. Nodules may be tender when they first appear, but they are generally painless. Symptoms of this condition develop slowly over months or years. Later symptoms  of this condition may include:  Thick cords of tissue in the palm.  Fingers curled up toward the palm.  Inability to straighten the fingers into their normal position. How is this diagnosed? This condition is diagnosed with a physical exam, which may include:  Looking at your hands and feeling your hands. This is to check for thickened fascia and nodules.  Measuring finger motion.  Doing the [employer]. You may be asked to try to put your hand on a surface, with your palm down and your fingers straight out. How is this treated? There is no cure for this condition, but treatment can make symptoms more manageable and relieve discomfort. Treatment options may include:  Physical therapy. This can strengthen your hand and increase flexibility.  Occupational therapy. This can help you with everyday tasks that may be more difficult because of your condition.  A hand splint.  Shots (injections). Substances may be injected into your hand, such as:  Medicines that help to decrease swelling (corticosteroids).  Proteins (collagenase) to weaken thick tissue. After a collagenase injection, your health care provider may stretch your fingers.  Needle aponeurotomy. In this procedure, a needle is pushed through the skin and into the fascia. Moving the needle against the fascia can weaken or break up the thick tissue.  Surgery. This may be needed if your condition causes discomfort or interferes with everyday activities. Physical therapy is usually needed after surgery. In some cases, symptoms never develop to the point of needing major treatment, and caring for yourself at home can be enough to manage your condition. Symptoms often return after treatment. Follow these  instructions at home: If you have a splint:   Do not put pressure on any part of the splint until it is fully hardened. This may take several hours.  Wear the splint as told by your health care provider. Remove it only  as told by your health care provider.  Loosen the splint if your fingers tingle, become numb, or turn cold and blue.  Do not let your splint get wet if it is not waterproof.  If your splint is not waterproof, cover it with a watertight covering when you take a bath or a shower.  Do not take baths, swim, or use a hot tub until your health care provider approves. Ask your health care provider if you can take showers. You may only be allowed to take sponge baths for bathing.  Keep the splint clean.  Ask your health care provider when it is safe to drive. Hand Care   Take these actions to help protect your hand from possible injury:  Use tools that have padded grips.  Wear protective gloves while you work with your hands.  Avoid repetitive hand movements.  Avoid actions that cause pain or discomfort.  Stretch your hand by gently pulling your fingers backward toward your wrist. Do this as often as is comfortable. Stop if this causes pain.  Gently massage your hand as often as is comfortable.  If directed, apply heat to the affected area as often as told by your health care provider. Use the heat source that your health care provider recommends, such as a moist heat pack or a heating pad.  Place a towel between your skin and the heat source.  Leave the heat on for 20-30 minutes.  Remove the heat if your skin turns bright red. This is especially important if you are unable to feel pain, heat, or cold. You may have a greater risk of getting burned. General instructions   Take over-the-counter and prescription medicines only as told by your health care provider.  Manage any other conditions that you have, such as diabetes.  If physical therapy was prescribed, do exercises as told by your health care provider.  Keep all follow-up visits as told by your health care provider. This is important. Contact a health care provider if:  You develop new symptoms, or your symptoms get  worse.  You have pain that gets worse or does not get better with medicine.  You have difficulty or discomfort with everyday tasks.  You have problems with your splint.  You develop numbness or tingling. Get help right away if:  You have severe pain.  Your fingers change color or become unusually cold. This information is not intended to replace advice given to you by your health care provider. Make sure you discuss any questions you have with your health care provider. Document Released: 09/06/2009 Document Revised: 12/24/2015 Document Reviewed: 04/03/2015 Elsevier Interactive Patient Education  2017 [employer].

## 2017-01-28 NOTE — Progress Notes (Signed)
Name: Timothy Hatfield   MRN: [id number]    DOB: October 21, 1946   Date:01/28/2017       Progress Note  Subjective  Chief Complaint  Chief Complaint  Patient presents with  . Follow-up    having to "take Tylenol every day or I have neck pain and headache." Wants to go back on Etodolac and has noticed he can't make a fist with L) hand.     Neck Pain   This is a chronic problem. The current episode started more than 1 month ago. The problem occurs daily. The problem has been waxing and waning. The pain is associated with nothing (CABG 11/17). The pain is present in the occipital region. The quality of the pain is described as aching. The pain is at a severity of 2/10. The pain is moderate. The symptoms are aggravated by bending and twisting. The pain is worse during the day. Stiffness is present in the morning. Associated symptoms include headaches, paresis and weakness. Pertinent negatives include no chest pain, fever, leg pain, numbness, tingling or weight loss. He has tried acetaminophen for the symptoms. The treatment provided no relief.  Hand Pain   The incident occurred more than 1 week ago. There was no injury mechanism. The pain is present in the left hand. The quality of the pain is described as aching. The pain is at a severity of 3/10. The pain is mild. Associated symptoms include muscle weakness. Pertinent negatives include no chest pain, numbness or tingling. The treatment provided mild relief.    No problem-specific Assessment & Plan notes found for this encounter.   Past Medical History:  Diagnosis Date  . Allergy   . GERD (gastroesophageal reflux disease)   . Hyperlipidemia   . Hypertension   . Renal disorder    kidney stones    Past Surgical History:  Procedure Laterality Date  . CARDIAC CATHETERIZATION Left 09/09/2016   Procedure: Left Heart Cath and Coronary Angiography;  Surgeon: Yolonda Kida, MD;  Location: [place] [facility];  Service: Cardiovascular;   Laterality: Left;  . COLONOSCOPY  2013   normal/ Dr Vira Agar- cleared for 10 yrs  . HERNIA REPAIR    . kidney stones     lithotripsy  . UPPER GI ENDOSCOPY  2013    No family history on file.  Social History   Social History  . Marital status: Married    Spouse name: N/A  . Number of children: N/A  . Years of education: N/A   Occupational History  . Not on file.   Social History Main Topics  . Smoking status: Never Smoker  . Smokeless tobacco: Never Used  . Alcohol use No  . Drug use: No  . Sexual activity: No   Other Topics Concern  . Not on file   Social History Narrative  . No narrative on file    Allergies  Allergen Reactions  . Bee Venom     Has epi- pen on hand    Outpatient Medications Prior to Visit  Medication Sig Dispense Refill  . aspirin EC 81 MG tablet Take 81 mg by mouth daily.    . benazepril-hydrochlorthiazide (LOTENSIN HCT) 10-12.5 MG tablet Take 1 tablet by mouth daily. 90 tablet 1  . Dutasteride-Tamsulosin HCl (JALYN) 0.5-0.4 MG CAPS Take 1 capsule by mouth daily. Urology- [facility]    . EPINEPHrine (EPIPEN 2-PAK) 0.3 mg/0.3 mL IJ SOAJ injection Inject 0.3 mLs (0.3 mg total) into the muscle once. 2 Device 0  .  folic acid (FOLVITE) 1 MG tablet Take 1 mg by mouth daily.    . metoprolol succinate (TOPROL-XL) 25 MG 24 hr tablet Take 25 mg by mouth daily.    . mometasone (NASONEX) 50 MCG/ACT nasal spray Place 2 sprays into the nose daily. 17 g 11  . nitroGLYCERIN (NITROSTAT) 0.4 MG SL tablet Place 1 tablet (0.4 mg total) under the tongue every 5 (five) minutes as needed for chest pain. 50 tablet 3  . pantoprazole (PROTONIX) 40 MG tablet Take 1 tablet (40 mg total) by mouth daily. 90 tablet 1  . senna-docusate (SENOKOT-S) 8.6-50 MG tablet Take 1 tablet by mouth daily.    Marland Kitchen ALPRAZolam (XANAX) 0.25 MG tablet Take 1 tablet (0.25 mg total) by mouth 2 (two) times daily as needed for anxiety. (Patient not taking: Reported on 11/27/2016) 20 tablet 0  . etodolac  (LODINE) 500 MG tablet Take 1 tablet (500 mg total) by mouth 2 (two) times daily. (Patient not taking: Reported on 01/28/2017) 60 tablet 11  . azithromycin (ZITHROMAX) 250 MG tablet 2 today then 1 a day for 4 days 6 tablet 0  . oxycodone (OXY-IR) 5 MG capsule Take 5 mg by mouth every 4 (four) hours as needed.     No facility-administered medications prior to visit.     Review of Systems  Constitutional: Negative for chills, fever, malaise/fatigue and weight loss.  HENT: Negative for ear discharge, ear pain and sore throat.   Eyes: Negative for blurred vision.  Respiratory: Negative for cough, sputum production, shortness of breath and wheezing.   Cardiovascular: Negative for chest pain, palpitations and leg swelling.  Gastrointestinal: Negative for abdominal pain, blood in stool, constipation, diarrhea, heartburn, melena and nausea.  Genitourinary: Negative for dysuria, frequency, hematuria and urgency.  Musculoskeletal: Positive for neck pain. Negative for back pain, joint pain and myalgias.  Skin: Negative for rash.  Neurological: Positive for weakness and headaches. Negative for dizziness, tingling, sensory change, focal weakness and numbness.  Endo/Heme/Allergies: Negative for environmental allergies and polydipsia. Does not bruise/bleed easily.  Psychiatric/Behavioral: Negative for depression and suicidal ideas. The patient is not nervous/anxious and does not have insomnia.      Objective  Vitals:   01/28/17 1402  BP: 138/72  Pulse: 80  Weight: 189 lb (85.7 kg)  Height: 5\' 7"  (1.702 m)    Physical Exam  Constitutional: He is oriented to person, place, and time and well-developed, well-nourished, and in no distress.  HENT:  Head: Normocephalic.  Right Ear: External ear normal.  Left Ear: External ear normal.  Nose: Nose normal.  Mouth/Throat: Oropharynx is clear and moist.  Eyes: Conjunctivae and EOM are normal. Pupils are equal, round, and reactive to light. Right eye  exhibits no discharge. Left eye exhibits no discharge. No scleral icterus.  Neck: Normal range of motion. Neck supple. No JVD present. No tracheal deviation present. No thyromegaly present.  Cardiovascular: Normal rate, regular rhythm, normal heart sounds and intact distal pulses.  Exam reveals no gallop and no friction rub.   No murmur heard. Pulmonary/Chest: Breath sounds normal. No respiratory distress. He has no wheezes. He has no rales.  Abdominal: Soft. Bowel sounds are normal. He exhibits no mass. There is no hepatosplenomegaly. There is no tenderness. There is no rebound, no guarding and no CVA tenderness.  Musculoskeletal: He exhibits no edema.       Cervical back: He exhibits decreased range of motion and spasm. He exhibits no tenderness and no bony tenderness.  Left hand: He exhibits decreased range of motion and tenderness.  Lymphadenopathy:    He has no cervical adenopathy.  Neurological: He is alert and oriented to person, place, and time. He has normal sensation, normal strength, normal reflexes and intact cranial nerves. No cranial nerve deficit.  Skin: Skin is warm. No rash noted.  Psychiatric: Mood and affect normal.  Nursing note and vitals reviewed.     Assessment & Plan  Problem List Items Addressed This Visit    None    Visit Diagnoses    Dupuytren's contracture of left hand    -  Primary   Relevant Orders   Ambulatory referral to Orthopedic Surgery   Cervical disc disease       Relevant Medications   etodolac (LODINE) 400 MG tablet   predniSONE (DELTASONE) 10 MG tablet   Other Relevant Orders   DG Cervical Spine Complete   Cervical radiculopathy       Relevant Medications   etodolac (LODINE) 400 MG tablet      Meds ordered this encounter  Medications  . etodolac (LODINE) 400 MG tablet    Sig: Take 1 tablet (400 mg total) by mouth 2 (two) times daily.    Dispense:  60 tablet    Refill:  3  . predniSONE (DELTASONE) 10 MG tablet    Sig: Take 1  tablet (10 mg total) by mouth daily with breakfast.    Dispense:  30 tablet    Refill:  0    Taper 4,4,4,3,3,3,2,2,2,1,1,1,      Dr. Candiss Gale[facility]  01/28/17

## 2017-03-11 ENCOUNTER — Other Ambulatory Visit: Payer: Self-pay | Admitting: Family Medicine

## 2017-03-11 DIAGNOSIS — K219 Gastro-esophageal reflux disease without esophagitis: Secondary | ICD-10-CM

## 2017-03-11 DIAGNOSIS — K2270 Barrett's esophagus without dysplasia: Secondary | ICD-10-CM

## 2017-03-17 ENCOUNTER — Other Ambulatory Visit: Payer: Self-pay

## 2017-03-17 DIAGNOSIS — K2270 Barrett's esophagus without dysplasia: Secondary | ICD-10-CM

## 2017-03-17 DIAGNOSIS — K219 Gastro-esophageal reflux disease without esophagitis: Secondary | ICD-10-CM

## 2017-03-19 ENCOUNTER — Ambulatory Visit
Admission: RE | Admit: 2017-03-19 | Discharge: 2017-03-19 | Disposition: A | Discharge status: Home / Self Care | Payer: Medicare Other | Source: Ambulatory Visit | Attending: Family Medicine | Admitting: Family Medicine

## 2017-03-19 ENCOUNTER — Ambulatory Visit (INDEPENDENT_AMBULATORY_CARE_PROVIDER_SITE_OTHER): Payer: Medicare Other | Admitting: Family Medicine

## 2017-03-19 ENCOUNTER — Encounter: Payer: Self-pay | Admitting: Family Medicine

## 2017-03-19 VITALS — BP 160/88 | HR 74 | Ht 67.0 in | Wt 188.0 lb

## 2017-03-19 DIAGNOSIS — R519 Headache, unspecified: Secondary | ICD-10-CM

## 2017-03-19 DIAGNOSIS — R51 Headache: Principal | ICD-10-CM

## 2017-03-19 DIAGNOSIS — R1084 Generalized abdominal pain: Secondary | ICD-10-CM

## 2017-03-19 DIAGNOSIS — F331 Major depressive disorder, recurrent, moderate: Secondary | ICD-10-CM

## 2017-03-19 DIAGNOSIS — I25810 Atherosclerosis of coronary artery bypass graft(s) without angina pectoris: Secondary | ICD-10-CM | POA: Diagnosis not present

## 2017-03-19 MED ORDER — SERTRALINE HCL 50 MG PO TABS: 50.0000 mg | ORAL_TABLET | Freq: Every day | ORAL | 3 refills | Status: DC

## 2017-03-19 NOTE — Progress Notes (Signed)
Name: Timothy Hatfield   MRN: [id number]    DOB: 08/23/1946   Date:03/19/2017       Progress Note  Subjective  Chief Complaint  Chief Complaint  Patient presents with  . Headache    Headache   This is a new ("never had headaches before") problem. The current episode started more than 1 month ago. The problem occurs daily. The problem has been waxing and waning. The pain is located in the bilateral and frontal region. The quality of the pain is described as aching (behind eyes). The pain is at a severity of 2/10. The pain is mild. Associated symptoms include abdominal pain, back pain, blurred vision, dizziness, neck pain and sinus pressure. Pertinent negatives include no coughing, ear pain, eye watering, fever, insomnia, loss of balance, nausea, numbness, phonophobia, photophobia, rhinorrhea, sore throat, tingling, weakness or weight loss. Nothing aggravates the symptoms. He has tried acetaminophen and NSAIDs for the symptoms. The treatment provided mild relief. There is no history of cluster headaches, migraine headaches or migraines in the family.  Abdominal Pain  This is a chronic problem. The current episode started more than 1 year ago. The onset quality is gradual. The problem occurs intermittently. The problem has been waxing and waning. The pain is located in the generalized abdominal region. The pain is at a severity of 3/10. The quality of the pain is a sensation of fullness and dull. The abdominal pain radiates to the back. Associated symptoms include headaches. Pertinent negatives include no constipation, diarrhea, dysuria, fever, frequency, hematochezia, hematuria, melena, myalgias, nausea or weight loss. The pain is relieved by nothing. Prior diagnostic workup includes lower endoscopy. There is no history of abdominal surgery, colon cancer or GERD.    No problem-specific Assessment & Plan notes found for this encounter.   Past Medical History:  Diagnosis Date  . Allergy   . GERD  (gastroesophageal reflux disease)   . Hyperlipidemia   . Hypertension   . Renal disorder    kidney stones    Past Surgical History:  Procedure Laterality Date  . CARDIAC CATHETERIZATION Left 09/09/2016   Procedure: Left Heart Cath and Coronary Angiography;  Surgeon: Yolonda Kida, MD;  Location: [facility];  Service: Cardiovascular;  Laterality: Left;  . COLONOSCOPY  2013   normal/ Dr Vira Agar- cleared for 10 yrs  . HERNIA REPAIR    . kidney stones     lithotripsy  . UPPER GI ENDOSCOPY  2013    No family history on file.  Social History   Social History  . Marital status: Married    Spouse name: N/A  . Number of children: N/A  . Years of education: N/A   Occupational History  . Not on file.   Social History Main Topics  . Smoking status: Never Smoker  . Smokeless tobacco: Never Used  . Alcohol use No  . Drug use: No  . Sexual activity: No   Other Topics Concern  . Not on file   Social History Narrative  . No narrative on file    Allergies  Allergen Reactions  . Bee Venom     Has epi- pen on hand    Outpatient Medications Prior to Visit  Medication Sig Dispense Refill  . aspirin EC 81 MG tablet Take 81 mg by mouth daily.    . benazepril-hydrochlorthiazide (LOTENSIN HCT) 10-12.5 MG tablet Take 1 tablet by mouth daily. 90 tablet 1  . Dutasteride-Tamsulosin HCl (JALYN) 0.5-0.4 MG CAPS Take 1 capsule  by mouth daily. Urology- [facility]    . EPINEPHrine (EPIPEN 2-PAK) 0.3 mg/0.3 mL IJ SOAJ injection Inject 0.3 mLs (0.3 mg total) into the muscle once. 2 Device 0  . etodolac (LODINE) 400 MG tablet Take 1 tablet (400 mg total) by mouth 2 (two) times daily. 60 tablet 3  . folic acid (FOLVITE) 1 MG tablet Take 1 mg by mouth daily.    . metoprolol succinate (TOPROL-XL) 25 MG 24 hr tablet Take 25 mg by mouth daily.    . mometasone (NASONEX) 50 MCG/ACT nasal spray Place 2 sprays into the nose daily. 17 g 11  . nitroGLYCERIN (NITROSTAT) 0.4 MG SL tablet Place  1 tablet (0.4 mg total) under the tongue every 5 (five) minutes as needed for chest pain. 50 tablet 3  . pantoprazole (PROTONIX) 40 MG tablet TAKE 1 TABLET (40 MG TOTAL) BY MOUTH DAILY. 90 tablet 0  . senna-docusate (SENOKOT-S) 8.6-50 MG tablet Take 1 tablet by mouth daily.    Marland Kitchen ALPRAZolam (XANAX) 0.25 MG tablet Take 1 tablet (0.25 mg total) by mouth 2 (two) times daily as needed for anxiety. (Patient not taking: Reported on 11/27/2016) 20 tablet 0  . etodolac (LODINE) 500 MG tablet Take 1 tablet (500 mg total) by mouth 2 (two) times daily. (Patient not taking: Reported on 01/28/2017) 60 tablet 11  . predniSONE (DELTASONE) 10 MG tablet Take 1 tablet (10 mg total) by mouth daily with breakfast. 30 tablet 0   No facility-administered medications prior to visit.     Review of Systems  Constitutional: Negative for chills, fever, malaise/fatigue and weight loss.  HENT: Positive for sinus pressure. Negative for ear discharge, ear pain, rhinorrhea and sore throat.   Eyes: Positive for blurred vision. Negative for photophobia.  Respiratory: Negative for cough, sputum production, shortness of breath and wheezing.   Cardiovascular: Negative for chest pain, palpitations and leg swelling.  Gastrointestinal: Positive for abdominal pain. Negative for blood in stool, constipation, diarrhea, heartburn, hematochezia, melena and nausea.  Genitourinary: Negative for dysuria, frequency, hematuria and urgency.  Musculoskeletal: Positive for back pain and neck pain. Negative for joint pain and myalgias.  Skin: Negative for rash.  Neurological: Positive for dizziness and headaches. Negative for tingling, sensory change, focal weakness, weakness, numbness and loss of balance.  Endo/Heme/Allergies: Negative for environmental allergies and polydipsia. Does not bruise/bleed easily.  Psychiatric/Behavioral: Negative for depression and suicidal ideas. The patient is not nervous/anxious and does not have insomnia.       Objective  Vitals:   03/19/17 1351  BP: (!) 160/88  Pulse: 74  Weight: 188 lb (85.3 kg)  Height: 5\' 7"  (1.702 m)    Physical Exam  Constitutional: He is oriented to person, place, and time and well-developed, well-nourished, and in no distress.  HENT:  Head: Normocephalic.  Right Ear: External ear normal.  Left Ear: External ear normal.  Nose: Nose normal.  Mouth/Throat: Oropharynx is clear and moist.  Eyes: Conjunctivae and EOM are normal. Pupils are equal, round, and reactive to light. Right eye exhibits no discharge. Left eye exhibits no discharge. No scleral icterus.  Neck: Normal range of motion. Neck supple. No JVD present. No tracheal deviation present. No thyromegaly present.  Cardiovascular: Normal rate, regular rhythm, normal heart sounds and intact distal pulses.  Exam reveals no gallop and no friction rub.   No murmur heard. Pulmonary/Chest: Breath sounds normal. No respiratory distress. He has no wheezes. He has no rales.  Abdominal: Soft. Bowel sounds are normal. He exhibits  no mass. There is no hepatosplenomegaly. There is no tenderness. There is no rebound, no guarding and no CVA tenderness.  Musculoskeletal: Normal range of motion. He exhibits no edema or tenderness.  Lymphadenopathy:    He has no cervical adenopathy.  Neurological: He is alert and oriented to person, place, and time. He has normal motor skills, normal sensation, normal strength, normal reflexes and intact cranial nerves. No cranial nerve deficit.  Skin: Skin is warm. No rash noted.  Psychiatric: Mood and affect normal.  Nursing note and vitals reviewed.     Assessment & Plan  Problem List Items Addressed This Visit    None    Visit Diagnoses    Headache around the eyes    -  Primary   Relevant Medications   sertraline (ZOLOFT) 50 MG tablet   Other Relevant Orders   DG Sinuses Complete   Ambulatory referral to Neurology   Generalized abdominal pain       Relevant Orders   US  Abdomen Complete   Moderate episode of recurrent major depressive disorder (HCC)       Relevant Medications   sertraline (ZOLOFT) 50 MG tablet   Coronary artery disease involving coronary bypass graft of native heart without angina pectoris       Relevant Orders   US Abdomen Complete      Meds ordered this encounter  Medications  . sertraline (ZOLOFT) 50 MG tablet    Sig: Take 1 tablet (50 mg total) by mouth daily.    Dispense:  30 tablet    Refill:  3      Dr. Mac[facility]  03/19/17

## 2017-03-23 ENCOUNTER — Ambulatory Visit
Admission: RE | Admit: 2017-03-23 | Discharge: 2017-03-23 | Disposition: A | Discharge status: Home / Self Care | Payer: Medicare Other | Source: Ambulatory Visit | Attending: Family Medicine | Admitting: Family Medicine

## 2017-03-23 DIAGNOSIS — N200 Calculus of kidney: Secondary | ICD-10-CM | POA: Insufficient documentation

## 2017-03-23 DIAGNOSIS — R1084 Generalized abdominal pain: Secondary | ICD-10-CM | POA: Insufficient documentation

## 2017-03-23 DIAGNOSIS — I25810 Atherosclerosis of coronary artery bypass graft(s) without angina pectoris: Secondary | ICD-10-CM | POA: Diagnosis present

## 2017-04-08 ENCOUNTER — Encounter: Payer: Self-pay | Admitting: Family Medicine

## 2017-04-08 ENCOUNTER — Ambulatory Visit (INDEPENDENT_AMBULATORY_CARE_PROVIDER_SITE_OTHER): Payer: Medicare Other | Admitting: Family Medicine

## 2017-04-08 VITALS — BP 124/70 | HR 64 | Ht 67.0 in | Wt 187.0 lb

## 2017-04-08 DIAGNOSIS — N3090 Cystitis, unspecified without hematuria: Secondary | ICD-10-CM

## 2017-04-08 DIAGNOSIS — R822 Biliuria: Secondary | ICD-10-CM | POA: Diagnosis not present

## 2017-04-08 DIAGNOSIS — F324 Major depressive disorder, single episode, in partial remission: Secondary | ICD-10-CM | POA: Diagnosis not present

## 2017-04-08 LAB — POCT URINALYSIS DIPSTICK
Bilirubin, UA: POSITIVE
GLUCOSE UA: NEGATIVE
Ketones, UA: NEGATIVE
NITRITE UA: NEGATIVE
SPEC GRAV UA: 1.02 (ref 1.010–1.025)
Urobilinogen, UA: 0.2 E.U./dL
pH, UA: 6 (ref 5.0–8.0)

## 2017-04-08 MED ORDER — SULFAMETHOXAZOLE-TRIMETHOPRIM 800-160 MG PO TABS: 1.0000 | ORAL_TABLET | Freq: Two times a day (BID) | ORAL | 0 refills | Status: DC

## 2017-04-08 NOTE — Progress Notes (Signed)
Name: Timothy Hatfield   MRN: [id number]    DOB: Mar 18, 1946   Date:04/08/2017       Progress Note  Subjective  Chief Complaint  Chief Complaint  Patient presents with  . Urinary Tract Infection    stinging and burning when urinated this am    Urinary Tract Infection   This is a new problem. The current episode started in the past 7 days. The problem occurs intermittently. The problem has been waxing and waning. The quality of the pain is described as burning. The pain is at a severity of 3/10. The pain is mild. There has been no fever. There is no history of pyelonephritis. Associated symptoms include frequency and urgency. Pertinent negatives include no chills, discharge, flank pain, hematuria, hesitancy, nausea, sweats or vomiting. Associated symptoms comments: dysuria. He has tried acetaminophen for the symptoms. The treatment provided mild relief.    No problem-specific Assessment & Plan notes found for this encounter.   Past Medical History:  Diagnosis Date  . Allergy   . GERD (gastroesophageal reflux disease)   . Hyperlipidemia   . Hypertension   . Renal disorder    kidney stones    Past Surgical History:  Procedure Laterality Date  . CARDIAC CATHETERIZATION Left 09/09/2016   Procedure: Left Heart Cath and Coronary Angiography;  Surgeon: Yolonda Kida, MD;  Location: [facility];  Service: Cardiovascular;  Laterality: Left;  . COLONOSCOPY  2013   normal/ Dr Vira Agar- cleared for 10 yrs  . HERNIA REPAIR    . kidney stones     lithotripsy  . UPPER GI ENDOSCOPY  2013    No family history on file.  Social History   Social History  . Marital status: Married    Spouse name: N/A  . Number of children: N/A  . Years of education: N/A   Occupational History  . Not on file.   Social History Main Topics  . Smoking status: Never Smoker  . Smokeless tobacco: Never Used  . Alcohol use No  . Drug use: No  . Sexual activity: No   Other Topics Concern  .  Not on file   Social History Narrative  . No narrative on file    Allergies  Allergen Reactions  . Bee Venom     Has epi- pen on hand    Outpatient Medications Prior to Visit  Medication Sig Dispense Refill  . aspirin EC 81 MG tablet Take 81 mg by mouth daily.    . benazepril-hydrochlorthiazide (LOTENSIN HCT) 10-12.5 MG tablet Take 1 tablet by mouth daily. 90 tablet 1  . Dutasteride-Tamsulosin HCl (JALYN) 0.5-0.4 MG CAPS Take 1 capsule by mouth daily. Urology- Stoioff    . EPINEPHrine (EPIPEN 2-PAK) 0.3 mg/0.3 mL IJ SOAJ injection Inject 0.3 mLs (0.3 mg total) into the muscle once. 2 Device 0  . etodolac (LODINE) 400 MG tablet Take 1 tablet (400 mg total) by mouth 2 (two) times daily. 60 tablet 3  . folic acid (FOLVITE) 1 MG tablet Take 1 mg by mouth daily.    . metoprolol succinate (TOPROL-XL) 25 MG 24 hr tablet Take 25 mg by mouth daily.    . mometasone (NASONEX) 50 MCG/ACT nasal spray Place 2 sprays into the nose daily. 17 g 11  . nitroGLYCERIN (NITROSTAT) 0.4 MG SL tablet Place 1 tablet (0.4 mg total) under the tongue every 5 (five) minutes as needed for chest pain. 50 tablet 3  . pantoprazole (PROTONIX) 40 MG tablet TAKE  1 TABLET (40 MG TOTAL) BY MOUTH DAILY. 90 tablet 0  . sertraline (ZOLOFT) 50 MG tablet Take 1 tablet (50 mg total) by mouth daily. 30 tablet 3  . ALPRAZolam (XANAX) 0.25 MG tablet Take 1 tablet (0.25 mg total) by mouth 2 (two) times daily as needed for anxiety. (Patient not taking: Reported on 11/27/2016) 20 tablet 0  . senna-docusate (SENOKOT-S) 8.6-50 MG tablet Take 1 tablet by mouth daily.     No facility-administered medications prior to visit.     Review of Systems  Constitutional: Negative for chills, fever, malaise/fatigue and weight loss.  HENT: Negative for ear discharge, ear pain and sore throat.   Eyes: Negative for blurred vision.  Respiratory: Negative for cough, sputum production, shortness of breath and wheezing.   Cardiovascular: Negative for  chest pain, palpitations and leg swelling.  Gastrointestinal: Negative for abdominal pain, blood in stool, constipation, diarrhea, heartburn, melena, nausea and vomiting.  Genitourinary: Positive for frequency and urgency. Negative for dysuria, flank pain, hematuria and hesitancy.  Musculoskeletal: Negative for back pain, joint pain, myalgias and neck pain.  Skin: Negative for rash.  Neurological: Negative for dizziness, tingling, sensory change, focal weakness and headaches.  Endo/Heme/Allergies: Negative for environmental allergies and polydipsia. Does not bruise/bleed easily.  Psychiatric/Behavioral: Negative for depression and suicidal ideas. The patient is not nervous/anxious and does not have insomnia.      Objective  Vitals:   04/08/17 1102  BP: 124/70  Pulse: 64  Weight: 187 lb (84.8 kg)  Height: 5\' 7"  (1.702 m)    Physical Exam  Constitutional: He is oriented to person, place, and time and well-developed, well-nourished, and in no distress.  HENT:  Head: Normocephalic.  Right Ear: External ear normal.  Left Ear: External ear normal.  Nose: Nose normal.  Mouth/Throat: Oropharynx is clear and moist.  Eyes: Conjunctivae and EOM are normal. Pupils are equal, round, and reactive to light. Right eye exhibits no discharge. Left eye exhibits no discharge. No scleral icterus.  Neck: Normal range of motion. Neck supple. No JVD present. No tracheal deviation present. No thyromegaly present.  Cardiovascular: Normal rate, regular rhythm, normal heart sounds and intact distal pulses.  Exam reveals no gallop and no friction rub.   No murmur heard. Pulmonary/Chest: Breath sounds normal. No respiratory distress. He has no wheezes. He has no rales.  Abdominal: Soft. Bowel sounds are normal. He exhibits no mass. There is no hepatosplenomegaly. There is tenderness in the suprapubic area. There is no rebound, no guarding and no CVA tenderness.  Musculoskeletal: Normal range of motion. He  exhibits no edema or tenderness.  Lymphadenopathy:    He has no cervical adenopathy.  Neurological: He is alert and oriented to person, place, and time. He has normal sensation, normal strength, normal reflexes and intact cranial nerves. No cranial nerve deficit.  Skin: Skin is warm. No rash noted.  Psychiatric: Mood and affect normal.  Nursing note and vitals reviewed.     Assessment & Plan  Problem List Items Addressed This Visit    None    Visit Diagnoses    Cystitis    -  Primary   Relevant Orders   POCT urinalysis dipstick (Completed)   Bilirubin in urine       Relevant Orders   Hepatic Function Panel (6)   Major depressive disorder with single episode, in partial remission (HCC)       continue sertraline      Meds ordered this encounter  Medications  .  sulfamethoxazole-trimethoprim (BACTRIM DS,SEPTRA DS) 800-160 MG tablet    Sig: Take 1 tablet by mouth 2 (two) times daily.    Dispense:  14 tablet    Refill:  0      Dr. Otilio Miu [facility]  04/08/17

## 2017-04-09 LAB — HEPATIC FUNCTION PANEL (6)
ALK PHOS: 59 IU/L (ref 39–117)
ALT: 23 IU/L (ref 0–44)
AST: 25 IU/L (ref 0–40)
Albumin: 4 g/dL (ref 3.5–4.8)
BILIRUBIN TOTAL: 1 mg/dL (ref 0.0–1.2)
Bilirubin, Direct: 0.29 mg/dL (ref 0.00–0.40)

## 2017-06-12 ENCOUNTER — Other Ambulatory Visit: Payer: Self-pay | Admitting: Family Medicine

## 2017-06-12 DIAGNOSIS — K219 Gastro-esophageal reflux disease without esophagitis: Secondary | ICD-10-CM

## 2017-06-12 DIAGNOSIS — K2270 Barrett's esophagus without dysplasia: Secondary | ICD-10-CM

## 2017-07-24 ENCOUNTER — Other Ambulatory Visit: Payer: Self-pay | Admitting: Family Medicine

## 2017-07-24 DIAGNOSIS — M5090 Cervical disc disorder, unspecified, unspecified cervical region: Secondary | ICD-10-CM

## 2017-07-24 DIAGNOSIS — M5412 Radiculopathy, cervical region: Secondary | ICD-10-CM

## 2017-08-23 ENCOUNTER — Other Ambulatory Visit: Payer: Self-pay

## 2017-08-24 ENCOUNTER — Other Ambulatory Visit: Payer: Self-pay

## 2017-08-24 DIAGNOSIS — M5412 Radiculopathy, cervical region: Secondary | ICD-10-CM

## 2017-08-24 DIAGNOSIS — M5090 Cervical disc disorder, unspecified, unspecified cervical region: Secondary | ICD-10-CM

## 2017-08-24 MED ORDER — ETODOLAC 400 MG PO TABS: 400.0000 mg | ORAL_TABLET | Freq: Two times a day (BID) | ORAL | 1 refills | Status: DC

## 2017-08-29 ENCOUNTER — Other Ambulatory Visit: Payer: Self-pay | Admitting: Family Medicine

## 2017-08-29 DIAGNOSIS — J301 Allergic rhinitis due to pollen: Secondary | ICD-10-CM

## 2017-09-28 ENCOUNTER — Ambulatory Visit: Payer: Medicare Other | Admitting: Family Medicine

## 2017-09-28 ENCOUNTER — Encounter: Payer: Self-pay | Admitting: Family Medicine

## 2017-09-28 VITALS — BP 130/80 | HR 80 | Ht 67.0 in | Wt 201.0 lb

## 2017-09-28 DIAGNOSIS — Z23 Encounter for immunization: Secondary | ICD-10-CM

## 2017-09-28 DIAGNOSIS — K219 Gastro-esophageal reflux disease without esophagitis: Secondary | ICD-10-CM

## 2017-09-28 DIAGNOSIS — M5412 Radiculopathy, cervical region: Secondary | ICD-10-CM

## 2017-09-28 DIAGNOSIS — M5090 Cervical disc disorder, unspecified, unspecified cervical region: Secondary | ICD-10-CM | POA: Diagnosis not present

## 2017-09-28 DIAGNOSIS — J301 Allergic rhinitis due to pollen: Secondary | ICD-10-CM

## 2017-09-28 DIAGNOSIS — I10 Essential (primary) hypertension: Secondary | ICD-10-CM | POA: Diagnosis not present

## 2017-09-28 DIAGNOSIS — F331 Major depressive disorder, recurrent, moderate: Secondary | ICD-10-CM

## 2017-09-28 DIAGNOSIS — K2270 Barrett's esophagus without dysplasia: Secondary | ICD-10-CM | POA: Diagnosis not present

## 2017-09-28 MED ORDER — PANTOPRAZOLE SODIUM 40 MG PO TBEC: 40.0000 mg | DELAYED_RELEASE_TABLET | Freq: Every day | ORAL | 1 refills | Status: DC

## 2017-09-28 MED ORDER — METOPROLOL SUCCINATE ER 25 MG PO TB24: 25.0000 mg | ORAL_TABLET | Freq: Every day | ORAL | 1 refills | Status: DC

## 2017-09-28 MED ORDER — BENAZEPRIL-HYDROCHLOROTHIAZIDE 10-12.5 MG PO TABS: 1.0000 | ORAL_TABLET | Freq: Every day | ORAL | 1 refills | Status: DC

## 2017-09-28 MED ORDER — ETODOLAC 400 MG PO TABS: 400.0000 mg | ORAL_TABLET | Freq: Two times a day (BID) | ORAL | 1 refills | Status: DC

## 2017-09-28 MED ORDER — SERTRALINE HCL 50 MG PO TABS: 25.0000 mg | ORAL_TABLET | Freq: Every day | ORAL | 1 refills | Status: DC

## 2017-09-28 MED ORDER — MOMETASONE FUROATE 50 MCG/ACT NA SUSP: 2.0000 | Freq: Every day | NASAL | 1 refills | Status: DC

## 2017-09-28 NOTE — Progress Notes (Signed)
Name: Timothy Hatfield   MRN: [id number]    DOB: August 07, 1946   Date:09/28/2017       Progress Note  Subjective  Chief Complaint  Chief Complaint  Patient presents with  . Hypertension  . Gastroesophageal Reflux  . Allergic Rhinitis   . Depression  . Arthritis    Hypertension  This is a chronic problem. The current episode started more than 1 year ago. The problem has been gradually improving since onset. The problem is controlled. Pertinent negatives include no anxiety, blurred vision, chest pain, headaches, malaise/fatigue, neck pain, orthopnea, palpitations, peripheral edema, PND, shortness of breath or sweats. Risk factors for coronary artery disease include dyslipidemia and male gender. Past treatments include beta blockers, ACE inhibitors and diuretics. The current treatment provides moderate improvement. There are no compliance problems.  There is no history of angina, kidney disease, CAD/MI, CVA, heart failure, left ventricular hypertrophy, PVD or retinopathy. There is no history of chronic renal disease, a hypertension causing med or renovascular disease.  Gastroesophageal Reflux  He reports no abdominal pain, no belching, no chest pain, no choking, no coughing, no dysphagia, no early satiety, no globus sensation, no heartburn, no hoarse voice, no nausea, no sore throat, no stridor or no wheezing. This is a recurrent problem. The current episode started more than 1 year ago. The symptoms are aggravated by certain foods. Pertinent negatives include no fatigue, melena or weight loss. There are no known risk factors. He has tried a PPI for the symptoms. The treatment provided moderate relief.  Depression         This is a chronic problem.  The current episode started more than 1 year ago.   The onset quality is gradual.   The problem occurs intermittently.  The problem has been gradually improving since onset.  Associated symptoms include no decreased concentration, no fatigue, no  helplessness, no hopelessness, does not have insomnia, not irritable, no restlessness, no decreased interest, no appetite change, no body aches, no myalgias, no headaches, no indigestion, not sad and no suicidal ideas.     The symptoms are aggravated by nothing.  Past treatments include SSRIs - Selective serotonin reuptake inhibitors.  Compliance with treatment is good.  Previous treatment provided moderate relief.   Pertinent negatives include no anxiety. Arthritis  Presents for follow-up visit. The symptoms have been stable. Affected locations include the neck. Pertinent negatives include no diarrhea, dysuria, fatigue, fever, rash, Raynaud's syndrome or weight loss.    No problem-specific Assessment & Plan notes found for this encounter.   Past Medical History:  Diagnosis Date  . Allergy   . GERD (gastroesophageal reflux disease)   . Hyperlipidemia   . Hypertension   . Renal disorder    kidney stones    Past Surgical History:  Procedure Laterality Date  . COLONOSCOPY  2013   normal/ Dr Vira Agar- cleared for 10 yrs  . HERNIA REPAIR    . kidney stones     lithotripsy  . UPPER GI ENDOSCOPY  2013    No family history on file.  Social History   Socioeconomic History  . Marital status: Married    Spouse name: Not on file  . Number of children: Not on file  . Years of education: Not on file  . Highest education level: Not on file  Social Needs  . Financial resource strain: Not on file  . Food insecurity - worry: Not on file  . Food insecurity - inability: Not on file  .  Transportation needs - medical: Not on file  . Transportation needs - non-medical: Not on file  Occupational History  . Not on file  Tobacco Use  . Smoking status: Never Smoker  . Smokeless tobacco: Never Used  Substance and Sexual Activity  . Alcohol use: No    Alcohol/week: 0.0 oz  . Drug use: No  . Sexual activity: No  Other Topics Concern  . Not on file  Social History Narrative  . Not on file     Allergies  Allergen Reactions  . Bee Venom     Has epi- pen on hand    Outpatient Medications Prior to Visit  Medication Sig Dispense Refill  . aspirin EC 81 MG tablet Take 81 mg by mouth daily.    Marland Kitchen co-enzyme Q-10 30 MG capsule Take 30 mg daily by mouth.    . Dutasteride-Tamsulosin HCl (JALYN) 0.5-0.4 MG CAPS Take 1 capsule by mouth daily. Urology- [facility]    . EPINEPHrine (EPIPEN 2-PAK) 0.3 mg/0.3 mL IJ SOAJ injection Inject 0.3 mLs (0.3 mg total) into the muscle once. 2 Device 0  . folic acid (FOLVITE) 1 MG tablet Take 1 mg by mouth daily.    Marland Kitchen glucosamine-chondroitin 500-400 MG tablet Take 2 tablets at bedtime by mouth.    . Multiple Vitamins-Minerals (MULTIVITAMIN GUMMIES ADULT) CHEW Chew 1 each at bedtime by mouth.    . nitroGLYCERIN (NITROSTAT) 0.4 MG SL tablet Place 1 tablet (0.4 mg total) under the tongue every 5 (five) minutes as needed for chest pain. 50 tablet 3  . nortriptyline (PAMELOR) 10 MG capsule Take 2 capsules at bedtime by mouth. Chipper Herb  1  . senna-docusate (SENOKOT-S) 8.6-50 MG tablet Take 1 tablet by mouth daily.    . benazepril-hydrochlorthiazide (LOTENSIN HCT) 10-12.5 MG tablet Take 1 tablet by mouth daily. 90 tablet 1  . etodolac (LODINE) 400 MG tablet Take 1 tablet (400 mg total) by mouth 2 (two) times daily. 180 tablet 1  . metoprolol succinate (TOPROL-XL) 25 MG 24 hr tablet Take 25 mg by mouth daily.    . mometasone (NASONEX) 50 MCG/ACT nasal spray PLACE 2 SPRAYS INTO THE NOSE DAILY. 17 g 0  . pantoprazole (PROTONIX) 40 MG tablet TAKE 1 TABLET (40 MG TOTAL) BY MOUTH DAILY. 90 tablet 0  . sertraline (ZOLOFT) 50 MG tablet Take 1 tablet (50 mg total) by mouth daily. (Patient taking differently: Take 25 mg daily by mouth. ) 30 tablet 3  . ALPRAZolam (XANAX) 0.25 MG tablet Take 1 tablet (0.25 mg total) by mouth 2 (two) times daily as needed for anxiety. (Patient not taking: Reported on 11/27/2016) 20 tablet 0  . sulfamethoxazole-trimethoprim (BACTRIM  DS,SEPTRA DS) 800-160 MG tablet Take 1 tablet by mouth 2 (two) times daily. 14 tablet 0   No facility-administered medications prior to visit.     Review of Systems  Constitutional: Negative for appetite change, chills, fatigue, fever, malaise/fatigue and weight loss.  HENT: Negative for ear discharge, ear pain, hoarse voice and sore throat.   Eyes: Negative for blurred vision.  Respiratory: Negative for cough, sputum production, choking, shortness of breath and wheezing.   Cardiovascular: Negative for chest pain, palpitations, orthopnea, leg swelling and PND.  Gastrointestinal: Negative for abdominal pain, blood in stool, constipation, diarrhea, dysphagia, heartburn, melena and nausea.  Genitourinary: Negative for dysuria, frequency, hematuria and urgency.  Musculoskeletal: Positive for arthritis. Negative for back pain, joint pain, myalgias and neck pain.  Skin: Negative for rash.  Neurological: Negative for dizziness,  tingling, sensory change, focal weakness and headaches.  Endo/Heme/Allergies: Negative for environmental allergies and polydipsia. Does not bruise/bleed easily.  Psychiatric/Behavioral: Positive for depression. Negative for decreased concentration and suicidal ideas. The patient is not nervous/anxious and does not have insomnia.      Objective  Vitals:   09/28/17 1518  BP: 130/80  Pulse: 80  Weight: 201 lb (91.2 kg)  Height: 5\' 7"  (1.702 m)    Physical Exam  Constitutional: He is oriented to person, place, and time and well-developed, well-nourished, and in no distress. He is not irritable.  HENT:  Head: Normocephalic.  Right Ear: External ear normal.  Left Ear: External ear normal.  Nose: Nose normal.  Mouth/Throat: Oropharynx is clear and moist.  Eyes: Conjunctivae and EOM are normal. Pupils are equal, round, and reactive to light. Right eye exhibits no discharge. Left eye exhibits no discharge. No scleral icterus.  Neck: Normal range of motion. Neck supple.  No JVD present. No tracheal deviation present. No thyromegaly present.  Cardiovascular: Normal rate, regular rhythm, normal heart sounds and intact distal pulses. Exam reveals no gallop and no friction rub.  No murmur heard. Pulmonary/Chest: Breath sounds normal. No respiratory distress. He has no wheezes. He has no rales.  Abdominal: Soft. Bowel sounds are normal. He exhibits no mass. There is no hepatosplenomegaly. There is no tenderness. There is no rebound, no guarding and no CVA tenderness.  Musculoskeletal: Normal range of motion. He exhibits no edema or tenderness.  Lymphadenopathy:    He has no cervical adenopathy.  Neurological: He is alert and oriented to person, place, and time. He has normal sensation, normal strength, normal reflexes and intact cranial nerves. No cranial nerve deficit.  Skin: Skin is warm. No rash noted.  Psychiatric: Mood and affect normal.  Nursing note and vitals reviewed.     Assessment & Plan  Problem List Items Addressed This Visit      Cardiovascular and Mediastinum   Essential (primary) hypertension - Primary   Relevant Medications   benazepril-hydrochlorthiazide (LOTENSIN HCT) 10-12.5 MG tablet   metoprolol succinate (TOPROL-XL) 25 MG 24 hr tablet   Other Relevant Orders   Renal Function Panel   Lipid Profile     Respiratory   Allergic rhinitis   Relevant Medications   mometasone (NASONEX) 50 MCG/ACT nasal spray     Digestive   Barrett esophagus   Relevant Medications   pantoprazole (PROTONIX) 40 MG tablet    Other Visit Diagnoses    Cervical disc disease       Relevant Medications   etodolac (LODINE) 400 MG tablet   Cervical radiculopathy       Relevant Medications   nortriptyline (PAMELOR) 10 MG capsule   etodolac (LODINE) 400 MG tablet   sertraline (ZOLOFT) 50 MG tablet   GERD without esophagitis       Relevant Medications   pantoprazole (PROTONIX) 40 MG tablet   Moderate episode of recurrent major depressive disorder (HCC)        Relevant Medications   nortriptyline (PAMELOR) 10 MG capsule   sertraline (ZOLOFT) 50 MG tablet   Influenza vaccine needed       Relevant Orders   Flu vaccine HIGH DOSE PF (Completed)   Need for 23-polyvalent pneumococcal polysaccharide vaccine       Relevant Orders   Pneumococcal polysaccharide vaccine 23-valent greater than or equal to 2yo subcutaneous/IM (Completed)      Meds ordered this encounter  Medications  . benazepril-hydrochlorthiazide (LOTENSIN HCT) 10-12.5 MG tablet  Sig: Take 1 tablet daily by mouth.    Dispense:  90 tablet    Refill:  1  . etodolac (LODINE) 400 MG tablet    Sig: Take 1 tablet (400 mg total) 2 (two) times daily by mouth.    Dispense:  180 tablet    Refill:  1  . metoprolol succinate (TOPROL-XL) 25 MG 24 hr tablet    Sig: Take 1 tablet (25 mg total) daily by mouth.    Dispense:  90 tablet    Refill:  1  . mometasone (NASONEX) 50 MCG/ACT nasal spray    Sig: Place 2 sprays daily into the nose.    Dispense:  17 g    Refill:  1  . pantoprazole (PROTONIX) 40 MG tablet    Sig: Take 1 tablet (40 mg total) daily by mouth.    Dispense:  90 tablet    Refill:  1  . sertraline (ZOLOFT) 50 MG tablet    Sig: Take 0.5 tablets (25 mg total) daily by mouth.    Dispense:  45 tablet    Refill:  1      Dr. Otilio Miu [facility]  09/28/17

## 2017-09-29 LAB — LIPID PANEL
Chol/HDL Ratio: 2.9 ratio (ref 0.0–5.0)
Cholesterol, Total: 129 mg/dL (ref 100–199)
HDL: 44 mg/dL (ref >39)
LDL CALC: 54 mg/dL (ref 0–99)
Triglycerides: 157 mg/dL — ABNORMAL HIGH (ref 0–149)
VLDL Cholesterol Cal: 31 mg/dL (ref 5–40)

## 2017-09-29 LAB — RENAL FUNCTION PANEL
Albumin: 4 g/dL (ref 3.5–4.8)
BUN/Creatinine Ratio: 16 (ref 10–24)
BUN: 15 mg/dL (ref 8–27)
CO2: 26 mmol/L (ref 20–29)
Calcium: 9 mg/dL (ref 8.6–10.2)
Chloride: 103 mmol/L (ref 96–106)
Creatinine, Ser: 0.95 mg/dL (ref 0.76–1.27)
GFR calc Af Amer: 92 mL/min/1.73 (ref >59)
GFR calc non Af Amer: 80 mL/min/1.73 (ref >59)
Glucose: 125 mg/dL — ABNORMAL HIGH (ref 65–99)
Phosphorus: 3.2 mg/dL (ref 2.5–4.5)
Potassium: 4.3 mmol/L (ref 3.5–5.2)
Sodium: 142 mmol/L (ref 134–144)

## 2017-12-31 ENCOUNTER — Encounter: Payer: Self-pay | Admitting: *Deleted

## 2018-01-03 ENCOUNTER — Encounter: Payer: Self-pay | Admitting: Certified Registered Nurse Anesthetist

## 2018-01-03 ENCOUNTER — Ambulatory Visit: Payer: Medicare Other | Admitting: Certified Registered Nurse Anesthetist

## 2018-01-03 ENCOUNTER — Encounter
Admission: RE | Disposition: A | Discharge status: Home / Self Care | Payer: Self-pay | Source: Ambulatory Visit | Attending: Unknown Physician Specialty

## 2018-01-03 ENCOUNTER — Ambulatory Visit
Admission: RE | Admit: 2018-01-03 | Discharge: 2018-01-03 | Disposition: A | Discharge status: Home / Self Care | Payer: Medicare Other | Source: Ambulatory Visit | Attending: Unknown Physician Specialty | Admitting: Unknown Physician Specialty

## 2018-01-03 DIAGNOSIS — I10 Essential (primary) hypertension: Secondary | ICD-10-CM | POA: Insufficient documentation

## 2018-01-03 DIAGNOSIS — K5730 Diverticulosis of large intestine without perforation or abscess without bleeding: Secondary | ICD-10-CM | POA: Diagnosis not present

## 2018-01-03 DIAGNOSIS — K621 Rectal polyp: Secondary | ICD-10-CM | POA: Insufficient documentation

## 2018-01-03 DIAGNOSIS — Z7982 Long term (current) use of aspirin: Secondary | ICD-10-CM | POA: Insufficient documentation

## 2018-01-03 DIAGNOSIS — K317 Polyp of stomach and duodenum: Secondary | ICD-10-CM | POA: Insufficient documentation

## 2018-01-03 DIAGNOSIS — K210 Gastro-esophageal reflux disease with esophagitis: Secondary | ICD-10-CM | POA: Insufficient documentation

## 2018-01-03 DIAGNOSIS — K219 Gastro-esophageal reflux disease without esophagitis: Secondary | ICD-10-CM | POA: Insufficient documentation

## 2018-01-03 DIAGNOSIS — Z951 Presence of aortocoronary bypass graft: Secondary | ICD-10-CM | POA: Diagnosis not present

## 2018-01-03 DIAGNOSIS — Z79899 Other long term (current) drug therapy: Secondary | ICD-10-CM | POA: Insufficient documentation

## 2018-01-03 DIAGNOSIS — G4730 Sleep apnea, unspecified: Secondary | ICD-10-CM | POA: Insufficient documentation

## 2018-01-03 DIAGNOSIS — Z1211 Encounter for screening for malignant neoplasm of colon: Secondary | ICD-10-CM | POA: Insufficient documentation

## 2018-01-03 DIAGNOSIS — Z7951 Long term (current) use of inhaled steroids: Secondary | ICD-10-CM | POA: Insufficient documentation

## 2018-01-03 DIAGNOSIS — D120 Benign neoplasm of cecum: Secondary | ICD-10-CM | POA: Diagnosis not present

## 2018-01-03 DIAGNOSIS — N400 Enlarged prostate without lower urinary tract symptoms: Secondary | ICD-10-CM | POA: Diagnosis not present

## 2018-01-03 DIAGNOSIS — Z09 Encounter for follow-up examination after completed treatment for conditions other than malignant neoplasm: Secondary | ICD-10-CM | POA: Diagnosis not present

## 2018-01-03 DIAGNOSIS — K640 First degree hemorrhoids: Secondary | ICD-10-CM | POA: Diagnosis not present

## 2018-01-03 HISTORY — DX: Personal history of urinary calculi: Z87.442

## 2018-01-03 HISTORY — PX: ESOPHAGOGASTRODUODENOSCOPY (EGD) WITH PROPOFOL: SHX5813

## 2018-01-03 HISTORY — DX: Benign prostatic hyperplasia without lower urinary tract symptoms: N40.0

## 2018-01-03 HISTORY — DX: Sleep apnea, unspecified: G47.30

## 2018-01-03 HISTORY — PX: COLONOSCOPY WITH PROPOFOL: SHX5780

## 2018-01-03 SURGERY — COLONOSCOPY WITH PROPOFOL
Anesthesia: General

## 2018-01-03 MED ORDER — LIDOCAINE HCL (PF) 2 % IJ SOLN
INTRAMUSCULAR | Status: AC
  Filled 2018-01-03: qty 10

## 2018-01-03 MED ORDER — SODIUM CHLORIDE 0.9 % IV SOLN
INTRAVENOUS | Status: DC
  Administered 2018-01-03: 1000 mL via INTRAVENOUS

## 2018-01-03 MED ORDER — PROPOFOL 500 MG/50ML IV EMUL
INTRAVENOUS | Status: DC | PRN
  Administered 2018-01-03: 180 ug/kg/min via INTRAVENOUS

## 2018-01-03 MED ORDER — LIDOCAINE HCL (PF) 1 % IJ SOLN
INTRAMUSCULAR | Status: AC
  Administered 2018-01-03: 0.3 mL via INTRADERMAL
  Filled 2018-01-03: qty 2

## 2018-01-03 MED ORDER — LIDOCAINE HCL (PF) 1 % IJ SOLN
2.0000 mL | Freq: Once | INTRAMUSCULAR | Status: AC
  Administered 2018-01-03: 0.3 mL via INTRADERMAL

## 2018-01-03 MED ORDER — PROPOFOL 500 MG/50ML IV EMUL
INTRAVENOUS | Status: AC
  Filled 2018-01-03: qty 50

## 2018-01-03 MED ORDER — SODIUM CHLORIDE 0.9 % IV SOLN: INTRAVENOUS | Status: DC

## 2018-01-03 MED ORDER — PROPOFOL 10 MG/ML IV BOLUS
INTRAVENOUS | Status: AC
  Filled 2018-01-03: qty 20

## 2018-01-03 MED ORDER — LIDOCAINE HCL (CARDIAC) 20 MG/ML IV SOLN
INTRAVENOUS | Status: DC | PRN
  Administered 2018-01-03: 50 mg via INTRAVENOUS

## 2018-01-03 MED ORDER — PHENYLEPHRINE HCL 10 MG/ML IJ SOLN
INTRAMUSCULAR | Status: DC | PRN
  Administered 2018-01-03: 200 ug via INTRAVENOUS

## 2018-01-03 MED ORDER — PROPOFOL 10 MG/ML IV BOLUS
INTRAVENOUS | Status: DC | PRN
  Administered 2018-01-03: 50 mg via INTRAVENOUS

## 2018-01-03 NOTE — H&P (Signed)
Primary Care Physician:  Juline Patch, MD Primary Gastroenterologist:  Dr. Vira Agar  Pre-Procedure History & Physical: HPI:  Timothy Hatfield is a 72 y.o. male is here for an endoscopy and colonoscopy.   Past Medical History:  Diagnosis Date  . Allergy   . BPH (benign prostatic hyperplasia)   . GERD (gastroesophageal reflux disease)   . History of kidney stones   . Hyperlipidemia   . Hypertension   . Renal disorder    kidney stones  . Sleep apnea    CPAP    Past Surgical History:  Procedure Laterality Date  . CARDIAC CATHETERIZATION Left 09/09/2016   Procedure: Left Heart Cath and Coronary Angiography;  Surgeon: Yolonda Kida, MD;  Location: [facility];  Service: Cardiovascular;  Laterality: Left;  . COLONOSCOPY  2013   normal/ Dr Vira Agar- cleared for 10 yrs  . CORONARY ARTERY BYPASS GRAFT    . EYE SURGERY    . HERNIA REPAIR    . kidney stones     lithotripsy  . LITHOTRIPSY    . UPPER GI ENDOSCOPY  2013    Prior to Admission medications   Medication Sig Start Date End Date Taking? Authorizing Provider  aspirin EC 81 MG tablet Take 81 mg by mouth daily.    [provider]  benazepril-hydrochlorthiazide (LOTENSIN HCT) 10-12.5 MG tablet Take 1 tablet daily by mouth. 09/28/17   Juline Patch, MD  co-enzyme Q-10 30 MG capsule Take 30 mg daily by mouth.    [provider]  Dutasteride-Tamsulosin HCl (JALYN) 0.5-0.4 MG CAPS Take 1 capsule by mouth daily. [facility]    [provider]  EPINEPHrine (EPIPEN 2-PAK) 0.3 mg/0.3 mL IJ SOAJ injection Inject 0.3 mLs (0.3 mg total) into the muscle once. 06/05/15   Harvest Dark, MD  etodolac (LODINE) 400 MG tablet Take 1 tablet (400 mg total) 2 (two) times daily by mouth. 09/28/17   Juline Patch, MD  folic acid (FOLVITE) 1 MG tablet Take 1 mg by mouth daily.    [provider]  glucosamine-chondroitin 500-400 MG tablet Take 2 tablets at bedtime by mouth.    [provider]  metoprolol succinate (TOPROL-XL) 25 MG 24 hr tablet Take 1 tablet (25 mg total) daily by mouth. 09/28/17   Juline Patch, MD  mometasone (NASONEX) 50 MCG/ACT nasal spray Place 2 sprays daily into the nose. 09/28/17   Juline Patch, MD  Multiple Vitamins-Minerals (MULTIVITAMIN GUMMIES ADULT) CHEW Chew 1 each at bedtime by mouth.    [provider]  nitroGLYCERIN (NITROSTAT) 0.4 MG SL tablet Place 1 tablet (0.4 mg total) under the tongue every 5 (five) minutes as needed for chest pain. 09/03/16   Juline Patch, MD  nortriptyline (PAMELOR) 10 MG capsule Take 2 capsules at bedtime by mouth. Chipper Herb 09/20/17   [provider]  pantoprazole (PROTONIX) 40 MG tablet Take 1 tablet (40 mg total) daily by mouth. 09/28/17   Juline Patch, MD  senna-docusate (SENOKOT-S) 8.6-50 MG tablet Take 1 tablet by mouth daily.    [provider]  sertraline (ZOLOFT) 50 MG tablet Take 0.5 tablets (25 mg total) daily by mouth. 09/28/17   Juline Patch, MD    Allergies as of 11/05/2017 - Review Complete 09/28/2017  Allergen Reaction Noted  . Bee venom  06/18/2015    Family History  Problem Relation Age of Onset  . Arrhythmia Mother   . Coronary artery disease Father   .  Diabetes Father   . Hyperlipidemia Brother     Social History   Socioeconomic History  . Marital status: Married    Spouse name: Not on file  . Number of children: Not on file  . Years of education: Not on file  . Highest education level: Not on file  Social Needs  . Financial resource strain: Not on file  . Food insecurity - worry: Not on file  . Food insecurity - inability: Not on file  . Transportation needs - medical: Not on file  . Transportation needs - non-medical: Not on file  Occupational History  . Not on file  Tobacco Use  . Smoking status: Never Smoker  . Smokeless tobacco: Never Used  Substance and Sexual Activity  . Alcohol use: No    Alcohol/week: 0.0 oz  . Drug  use: No  . Sexual activity: No  Other Topics Concern  . Not on file  Social History Narrative  . Not on file    Review of Systems: See HPI, otherwise negative ROS  Physical Exam: BP 134/89   Pulse 92   Temp (!) 96.5 F (35.8 C) (Tympanic)   Resp 17   Ht 5\' 7"  (1.702 m)   Wt 86.2 kg (190 lb)   SpO2 100%   BMI 29.76 kg/m  General:   Alert,  pleasant and cooperative in NAD Head:  Normocephalic and atraumatic. Neck:  Supple; no masses or thyromegaly. Lungs:  Clear throughout to auscultation.    Heart:  Regular rate and rhythm. Abdomen:  Soft, nontender and nondistended. Normal bowel sounds, without guarding, and without rebound.   Neurologic:  Alert and  oriented x4;  grossly normal neurologically.  Impression/Plan: [name] is here for an endoscopy and colonoscopy to be performed for colon screening and Follow up Barretts esophagus.  Risks, benefits, limitations, and alternatives regarding  endoscopy and colonoscopy have been reviewed with the patient.  Questions have been answered.  All parties agreeable.   Gaylyn Cheers, MD  01/03/2018, 9:28 AM

## 2018-01-03 NOTE — Transfer of Care (Signed)
Immediate Anesthesia Transfer of Care Note  Patient: Timothy Hatfield  Procedure(s) Performed: COLONOSCOPY WITH PROPOFOL (N/A ) ESOPHAGOGASTRODUODENOSCOPY (EGD) WITH PROPOFOL (N/A )  Patient Location: PACU  Anesthesia Type:General  Level of Consciousness: sedated  Airway & Oxygen Therapy: Patient Spontanous Breathing and Patient connected to nasal cannula oxygen  Post-op Assessment: Report given to RN and Post -op Vital signs reviewed and stable  Post vital signs: Reviewed and stable  Last Vitals:  Vitals:   01/03/18 0912 01/03/18 1013  BP: 134/89 113/75  Pulse: 92 87  Resp: 17 14  Temp: (!) 35.8 C 36.6 C  SpO2: 100% 95%    Last Pain:  Vitals:   01/03/18 1013  TempSrc: Tympanic  PainSc: Asleep         Complications: No apparent anesthesia complications

## 2018-01-03 NOTE — Anesthesia Post-op Follow-up Note (Signed)
Anesthesia QCDR form completed.        

## 2018-01-03 NOTE — Op Note (Signed)
[facility] Gastroenterology Patient Name: Timothy Hatfield Procedure Date: 01/03/2018 9:33 AM MRN: [id number] Account #: [id number] Date of Birth: 21-Nov-1946 Admit Type: Outpatient Age: 72 Room: [facility] ENDO ROOM 3 Gender: Male Note Status: Finalized Procedure:            Upper GI endoscopy Indications:          Follow-up of Barrett's esophagus Providers:            Manya Silvas, MD Referring MD:         Juline Patch, MD (Referring MD) Medicines:            Propofol per Anesthesia Complications:        No immediate complications. Procedure:            Pre-Anesthesia Assessment:                       - After reviewing the risks and benefits, the patient                        was deemed in satisfactory condition to undergo the                        procedure.                       After obtaining informed consent, the endoscope was                        passed under direct vision. Throughout the procedure,                        the patient's blood pressure, pulse, and oxygen                        saturations were monitored continuously. The Endoscope                        was introduced through the mouth, and advanced to the                        second part of duodenum. The upper GI endoscopy was                        accomplished without difficulty. The patient tolerated                        the procedure well. Findings:      There were esophageal mucosal changes secondary to established       short-segment Barrett's disease present in the lower third of the       esophagus. The maximum longitudinal extent of these mucosal changes was       2 cm in length. Mucosa was biopsied with a cold forceps for histology in       4 quadrants in the lower third of the esophagus. One specimen bottle was       sent to pathology. GEJ 39cm.      Multiple medium pedunculated and sessile polyps with no bleeding and no       stigmata of recent bleeding were found in  the gastric body.      The examined duodenum was  normal. Impression:           - Esophageal mucosal changes secondary to established                        short-segment Barrett's disease. Biopsied.                       - Multiple gastric polyps.                       - Normal examined duodenum. Recommendation:       - Await pathology results.                       - Perform a colonoscopy as previously scheduled. Manya Silvas, MD 01/03/2018 9:51:57 AM This report has been signed electronically. Number of Addenda: 0 Note Initiated On: 01/03/2018 9:33 AM      [facility]

## 2018-01-03 NOTE — Op Note (Signed)
[facility] Gastroenterology Patient Name: Timothy Hatfield Procedure Date: 01/03/2018 9:34 AM MRN: [id number] Account #: [id number] Date of Birth: [birth year] Admit Type: Outpatient Age: 72 Room: [facility] ENDO ROOM 3 Gender: Male Note Status: Finalized Procedure:            Colonoscopy Indications:          Screening for colorectal malignant neoplasm Providers:            Manya Silvas, MD Referring MD:         Juline Patch, MD (Referring MD) Medicines:            Propofol per Anesthesia Complications:        No immediate complications. Procedure:            Pre-Anesthesia Assessment:                       - After reviewing the risks and benefits, the patient                        was deemed in satisfactory condition to undergo the                        procedure.                       After obtaining informed consent, the colonoscope was                        passed under direct vision. Throughout the procedure,                        the patient's blood pressure, pulse, and oxygen                        saturations were monitored continuously. The                        Colonoscope was introduced through the anus and                        advanced to the the cecum, identified by appendiceal                        orifice and ileocecal valve. The colonoscopy was                        performed without difficulty. The patient tolerated the                        procedure well. The quality of the bowel preparation                        was excellent. Findings:      Two sessile polyps were found in the rectum and cecum. The polyps were       diminutive in size. These polyps were removed with a cold biopsy       forceps. Resection and retrieval were complete.      Multiple small-mouthed diverticula were found in the sigmoid colon.      Internal hemorrhoids were found during endoscopy. The hemorrhoids were  small and Grade I (internal hemorrhoids that  do not prolapse).      The exam was otherwise without abnormality. Impression:           - Two diminutive polyps in the rectum and in the cecum,                        removed with a cold biopsy forceps. Resected and                        retrieved.                       - Diverticulosis in the sigmoid colon.                       - Internal hemorrhoids.                       - The examination was otherwise normal. Recommendation:       - Await pathology results. Manya Silvas, MD 01/03/2018 10:13:38 AM This report has been signed electronically. Number of Addenda: 0 Note Initiated On: 01/03/2018 9:34 AM Scope Withdrawal Time: 0 hours 8 minutes 35 seconds  Total Procedure Duration: 0 hours 16 minutes 33 seconds       [facility]

## 2018-01-03 NOTE — Anesthesia Preprocedure Evaluation (Signed)
Anesthesia Evaluation  Patient identified by MRN, date of birth, ID band Patient awake    Reviewed: Allergy & Precautions, H&P , NPO status , Patient's Chart, lab work & pertinent test results, reviewed documented beta blocker date and time   Airway Mallampati: II   Neck ROM: full    Dental  (+) Poor Dentition   Pulmonary neg pulmonary ROS, sleep apnea and Continuous Positive Airway Pressure Ventilation ,    Pulmonary exam normal        Cardiovascular Exercise Tolerance: Good hypertension, On Medications negative cardio ROS Normal cardiovascular exam Rhythm:regular Rate:Normal     Neuro/Psych negative neurological ROS  negative psych ROS   GI/Hepatic negative GI ROS, Neg liver ROS, GERD  Medicated,  Endo/Other  negative endocrine ROS  Renal/GU Renal diseasenegative Renal ROS  negative genitourinary   Musculoskeletal   Abdominal   Peds  Hematology negative hematology ROS (+)   Anesthesia Other Findings Past Medical History: No date: Allergy No date: BPH (benign prostatic hyperplasia) No date: GERD (gastroesophageal reflux disease) No date: History of kidney stones No date: Hyperlipidemia No date: Hypertension No date: Renal disorder     Comment:  kidney stones No date: Sleep apnea     Comment:  CPAP Past Surgical History: 09/09/2016: CARDIAC CATHETERIZATION; Left     Comment:  Procedure: Left Heart Cath and Coronary Angiography;                Surgeon: Yolonda Kida, MD;  Location: [place]               CV LAB;  Service: Cardiovascular;  Laterality: Left; 2013: COLONOSCOPY     Comment:  normal/ Dr Vira Agar- cleared for 10 yrs No date: CORONARY ARTERY BYPASS GRAFT No date: EYE SURGERY No date: HERNIA REPAIR No date: kidney stones     Comment:  lithotripsy No date: LITHOTRIPSY 2013: UPPER GI ENDOSCOPY BMI    Body Mass Index:  29.76 kg/m     Reproductive/Obstetrics negative OB ROS                              Anesthesia Physical Anesthesia Plan  ASA: III  Anesthesia Plan: General   Post-op Pain Management:    Induction:   PONV Risk Score and Plan:   Airway Management Planned:   Additional Equipment:   Intra-op Plan:   Post-operative Plan:   Informed Consent: I have reviewed the patients History and Physical, chart, labs and discussed the procedure including the risks, benefits and alternatives for the proposed anesthesia with the patient or authorized representative who has indicated his/her understanding and acceptance.   Dental Advisory Given  Plan Discussed with: CRNA  Anesthesia Plan Comments:         Anesthesia Quick Evaluation

## 2018-01-04 ENCOUNTER — Encounter: Payer: Self-pay | Admitting: Unknown Physician Specialty

## 2018-01-04 LAB — SURGICAL PATHOLOGY

## 2018-01-04 NOTE — Anesthesia Postprocedure Evaluation (Signed)
Anesthesia Post Note  Patient: JAHSON EMANUELE  Procedure(s) Performed: COLONOSCOPY WITH PROPOFOL (N/A ) ESOPHAGOGASTRODUODENOSCOPY (EGD) WITH PROPOFOL (N/A )  Patient location during evaluation: PACU Anesthesia Type: General Level of consciousness: awake and alert Pain management: pain level controlled Vital Signs Assessment: post-procedure vital signs reviewed and stable Respiratory status: spontaneous breathing, nonlabored ventilation, respiratory function stable and patient connected to nasal cannula oxygen Cardiovascular status: blood pressure returned to baseline and stable Postop Assessment: no apparent nausea or vomiting Anesthetic complications: no     Last Vitals:  Vitals:   01/03/18 1033 01/03/18 1043  BP: (!) 109/58 117/65  Pulse:    Resp:    Temp:    SpO2:      Last Pain:  Vitals:   01/03/18 1023  TempSrc:   PainSc: 0-No pain                 Molli Barrows

## 2018-01-31 ENCOUNTER — Other Ambulatory Visit: Payer: Self-pay

## 2018-01-31 DIAGNOSIS — N200 Calculus of kidney: Secondary | ICD-10-CM

## 2018-01-31 NOTE — Progress Notes (Signed)
kub

## 2018-02-21 ENCOUNTER — Ambulatory Visit
Admission: RE | Admit: 2018-02-21 | Discharge: 2018-02-21 | Disposition: A | Discharge status: Home / Self Care | Payer: Medicare Other | Source: Ambulatory Visit | Attending: Urology | Admitting: Urology

## 2018-02-21 DIAGNOSIS — N200 Calculus of kidney: Secondary | ICD-10-CM | POA: Diagnosis not present

## 2018-02-23 ENCOUNTER — Encounter: Payer: Self-pay | Admitting: Urology

## 2018-02-23 ENCOUNTER — Ambulatory Visit: Payer: Medicare Other | Admitting: Urology

## 2018-02-23 VITALS — BP 151/87 | HR 84 | Ht 67.0 in | Wt 190.0 lb

## 2018-02-23 DIAGNOSIS — R972 Elevated prostate specific antigen [PSA]: Secondary | ICD-10-CM | POA: Diagnosis not present

## 2018-02-24 ENCOUNTER — Telehealth: Payer: Self-pay | Admitting: Radiology

## 2018-02-24 ENCOUNTER — Other Ambulatory Visit: Payer: Self-pay | Admitting: Radiology

## 2018-02-24 DIAGNOSIS — N200 Calculus of kidney: Secondary | ICD-10-CM

## 2018-02-24 LAB — PSA: Prostate Specific Ag, Serum: 1.5 ng/mL (ref 0.0–4.0)

## 2018-02-24 NOTE — Telephone Encounter (Signed)
Pt would like to proceed with PCNL. Will need orders please.

## 2018-02-27 ENCOUNTER — Encounter: Payer: Self-pay | Admitting: Urology

## 2018-02-27 MED ORDER — DUTASTERIDE-TAMSULOSIN HCL 0.5-0.4 MG PO CAPS: 1.0000 | ORAL_CAPSULE | Freq: Every day | ORAL | 11 refills | Status: DC

## 2018-02-27 NOTE — Progress Notes (Signed)
02/23/2018 8:46 AM   Timothy Hatfield 03-26-1946 [id number]  Referring provider: Juline Patch, MD [address] [place], [place] [address]  Chief Complaint  Patient presents with  . Elevated PSA    1year    HPI: 72 year old male presents for annual follow-up.  I have followed him for BPH and history of an elevated PSA.  I last saw him at [facility] in February 2018.  A prostate biopsy was performed in October 2010 for a PSA of 4.9 with benign pathology.  He is on tamsulosin/dutasteride and has no bothersome lower urinary tract symptoms.  His uncorrected PSA last year was 1.08.  He denies dysuria or gross hematuria.  He does have a history of stone disease and for the last 6 weeks has had dull intermittent right flank pain.  There are no identifiable precipitating, aggravating or alleviating factors.  He states his pain is similar to prior stones but not severe.  A PSA performed last week was stable at 1.5 (uncorrected).  A KUB was also performed which shows 2 large stones in the central part of the right kidney measuring 14 and 17 mm.   PMH: Past Medical History:  Diagnosis Date  . Allergy   . BPH (benign prostatic hyperplasia)   . GERD (gastroesophageal reflux disease)   . History of kidney stones   . Hyperlipidemia   . Hypertension   . Renal disorder    kidney stones  . Sleep apnea    CPAP    Surgical History: Past Surgical History:  Procedure Laterality Date  . CARDIAC CATHETERIZATION Left 09/09/2016   Procedure: Left Heart Cath and Coronary Angiography;  Surgeon: Yolonda Kida, MD;  Location: [facility] CV LAB;  Service: Cardiovascular;  Laterality: Left;  . COLONOSCOPY  2013   normal/ Dr Vira Agar- cleared for 10 yrs  . COLONOSCOPY WITH PROPOFOL N/A 01/03/2018   Procedure: COLONOSCOPY WITH PROPOFOL;  Surgeon: Manya Silvas, MD;  Location: [facility] ENDOSCOPY;  Service: Endoscopy;  Laterality: N/A;  . CORONARY ARTERY BYPASS GRAFT    .  ESOPHAGOGASTRODUODENOSCOPY (EGD) WITH PROPOFOL N/A 01/03/2018   Procedure: ESOPHAGOGASTRODUODENOSCOPY (EGD) WITH PROPOFOL;  Surgeon: Manya Silvas, MD;  Location: [facility] ENDOSCOPY;  Service: Endoscopy;  Laterality: N/A;  . EYE SURGERY    . HERNIA REPAIR    . kidney stones     lithotripsy  . LITHOTRIPSY    . UPPER GI ENDOSCOPY  2013    Home Medications:  Allergies as of 02/23/2018      Reactions   Bee Venom    Has epi- pen on hand      Medication List        Accurate as of 02/23/18 11:59 PM. Always use your most recent med list.          aspirin EC 81 MG tablet Take 81 mg by mouth daily.   atorvastatin 80 MG tablet Commonly known as:  LIPITOR Take 80 mg by mouth daily.   benazepril-hydrochlorthiazide 10-12.5 MG tablet Commonly known as:  LOTENSIN HCT Take 1 tablet daily by mouth.   co-enzyme Q-10 30 MG capsule Take 30 mg daily by mouth.   EPINEPHrine 0.3 mg/0.3 mL Soaj injection Commonly known as:  EPIPEN 2-PAK Inject 0.3 mLs (0.3 mg total) into the muscle once.   etodolac 400 MG tablet Commonly known as:  LODINE Take 1 tablet (400 mg total) 2 (two) times daily by mouth.   folic acid 1 MG tablet Commonly known as:  FOLVITE Take 1 mg by mouth daily.   glucosamine-chondroitin 500-400 MG tablet Take 2 tablets at bedtime by mouth.   JALYN 0.5-0.4 MG Caps Generic drug:  Dutasteride-Tamsulosin HCl Take 1 capsule by mouth daily. Urology- Tinya Cadogan   metoprolol succinate 25 MG 24 hr tablet Commonly known as:  TOPROL-XL Take 1 tablet (25 mg total) daily by mouth.   mometasone 50 MCG/ACT nasal spray Commonly known as:  NASONEX Place 2 sprays daily into the nose.   MULTIVITAMIN GUMMIES ADULT Chew Chew 1 each at bedtime by mouth.   nitroGLYCERIN 0.4 MG SL tablet Commonly known as:  NITROSTAT Place 1 tablet (0.4 mg total) under the tongue every 5 (five) minutes as needed for chest pain.   nortriptyline 10 MG capsule Commonly known as:  PAMELOR Take 2 capsules  at bedtime by mouth. Chipper Herb   pantoprazole 40 MG tablet Commonly known as:  PROTONIX Take 1 tablet (40 mg total) daily by mouth.   senna-docusate 8.6-50 MG tablet Commonly known as:  Senokot-S Take 1 tablet by mouth daily.   sertraline 50 MG tablet Commonly known as:  ZOLOFT Take 0.5 tablets (25 mg total) daily by mouth.       Allergies:  Allergies  Allergen Reactions  . Bee Venom     Has epi- pen on hand    Family History: Family History  Problem Relation Age of Onset  . Arrhythmia Mother   . Coronary artery disease Father   . Diabetes Father   . Hyperlipidemia Brother     Social History:  reports that he has never smoked. He has never used smokeless tobacco. He reports that he does not drink alcohol or use drugs.  ROS: UROLOGY Frequent Urination?: No Hard to postpone urination?: No Burning/pain with urination?: No Get up at night to urinate?: No Leakage of urine?: No Urine stream starts and stops?: No Trouble starting stream?: No Do you have to strain to urinate?: No Blood in urine?: No Urinary tract infection?: No Sexually transmitted disease?: No Injury to kidneys or bladder?: No Painful intercourse?: No Weak stream?: No Erection problems?: No Penile pain?: No  Gastrointestinal Nausea?: No Vomiting?: No Indigestion/heartburn?: No Diarrhea?: No Constipation?: No  Constitutional Fever: No Night sweats?: No Weight loss?: No Fatigue?: No  Skin Skin rash/lesions?: No Itching?: No  Eyes Blurred vision?: No Double vision?: No  Ears/Nose/Throat Sore throat?: No Sinus problems?: No  Hematologic/Lymphatic Swollen glands?: No Easy bruising?: No  Cardiovascular Leg swelling?: No Chest pain?: No  Respiratory Cough?: No Shortness of breath?: No  Endocrine Excessive thirst?: No  Musculoskeletal Back pain?: No Joint pain?: No  Neurological Headaches?: No Dizziness?: No  Psychologic Depression?: No Anxiety?:  No  Physical Exam: BP (!) 151/87   Pulse 84   Ht 5\' 7"  (1.702 m)   Wt 190 lb (86.2 kg)   BMI 29.76 kg/m   Constitutional:  Alert and oriented, No acute distress. HEENT: [place] AT, moist mucus membranes.  Trachea midline, no masses. Cardiovascular: No clubbing, cyanosis, or edema.  RRR Respiratory: Normal respiratory effort, no increased work of breathing.  Lungs clear GI: Abdomen is soft, nontender, nondistended, no abdominal masses GU: No CVA tenderness.  Prostate 40 g, smooth without nodules Lymph: No cervical or inguinal lymphadenopathy. Skin: No rashes, bruises or suspicious lesions. Neurologic: Grossly intact, no focal deficits, moving all 4 extremities. Psychiatric: Normal mood and affect.  Laboratory Data: Lab Results  Component Value Date   WBC 16.5 (H) 10/13/2016   HGB 10.8 (L)  10/13/2016   HCT 32.7 (L) 10/13/2016   MCV 94.4 10/13/2016   PLT 475 (H) 10/13/2016    Lab Results  Component Value Date   CREATININE 0.95 09/28/2017    No results found for: PSA  No results found for: TESTOSTERONE  No results found for: HGBA1C  Urinalysis    Component Value Date/Time   COLORURINE RED (A) 09/06/2016 1717   APPEARANCEUR CLOUDY (A) 09/06/2016 1717   LABSPEC 1.010 09/06/2016 1717   PHURINE  09/06/2016 1717    TEST NOT REPORTED DUE TO COLOR INTERFERENCE OF URINE PIGMENT   GLUCOSEU (A) 09/06/2016 1717    TEST NOT REPORTED DUE TO COLOR INTERFERENCE OF URINE PIGMENT   HGBUR (A) 09/06/2016 1717    TEST NOT REPORTED DUE TO COLOR INTERFERENCE OF URINE PIGMENT   BILIRUBINUR positive 04/08/2017 1128   KETONESUR (A) 09/06/2016 1717    TEST NOT REPORTED DUE TO COLOR INTERFERENCE OF URINE PIGMENT   PROTEINUR trace 04/08/2017 1128   PROTEINUR (A) 09/06/2016 1717    TEST NOT REPORTED DUE TO COLOR INTERFERENCE OF URINE PIGMENT   UROBILINOGEN 0.2 04/08/2017 1128   NITRITE negative 04/08/2017 1128   NITRITE (A) 09/06/2016 1717    TEST NOT REPORTED DUE TO COLOR INTERFERENCE OF  URINE PIGMENT   LEUKOCYTESUR Trace (A) 04/08/2017 1128    Lab Results  Component Value Date   BACTERIA NONE SEEN 09/06/2016    Pertinent Imaging: Images were personally reviewed  Results for orders placed during the hospital encounter of 02/21/18  Abdomen 1 view (KUB)   Narrative CLINICAL DATA:  Kidney stones  EXAM: ABDOMEN - 1 VIEW  COMPARISON:  05/09/2015 lumbar radiography  FINDINGS: Two large lamellated stones over the central right kidney, the larger measuring 17 mm. Possible small faint stones over the right lower pole. No visible ureteral calculus. Left pelvic calcification is stable from prior and likely vascular. Normal bowel gas pattern.  IMPRESSION: At least two right renal calculi that have enlarged from 2016, the largest measuring 17 mm.   Electronically Signed   By: Monte Fantasia M.D.   On: 02/21/2018 15:13     Assessment & Plan:   72 year old male with BPH and history of an elevated PSA.  His PSA is stable and DRE is benign.  Tamsulosin/dutasteride were refilled.  He will continue annual follow-ups.  He has significant stone burden in the right kidney and is mildly symptomatic.  He asked about shockwave lithotripsy and was informed that due to the volume of stone I would not recommend shockwave lithotripsy.  It most likely would take several treatments and the chance of rendering stone free are unlikely.  I discussed ureteroscopy and again due to stone burden this would need to be a staged procedure.  PCNL was also discussed and he was informed although the most invasive procedure is the one which would have the highest success rate at rendering stone free in one procedure although there is no guarantee that all stone fragments could be completely removed.  The procedure was discussed in detail including potential risks of bleeding/hemorrhage requiring transfusion and rarely additional procedures such as embolization and nephrectomy; infection/sepsis;  injury to surrounding structures including bowel, lung and liver.  He would like to think over these options and will call back with his decision.  He did have a abdominal ultrasound 03/2017.  If he desires PCNL would recommend a noncontrast stone protocol CT.    Abbie Sons, [facility] [address]  [address], [place], [place] [address] [phone]

## 2018-02-27 NOTE — H&P (View-Only) (Signed)
02/23/2018 8:46 AM   Timothy Hatfield 1946-10-09 [id number]  Referring provider: Juline Patch, MD [address] [place],  [address]  Chief Complaint  Patient presents with  . Elevated PSA    1year    HPI: 72 year old male presents for annual follow-up.  I have followed him for BPH and history of an elevated PSA.  I last saw him at [facility] in February 2018.  A prostate biopsy was performed in October 2010 for a PSA of 4.9 with benign pathology.  He is on tamsulosin/dutasteride and has no bothersome lower urinary tract symptoms.  His uncorrected PSA last year was 1.08.  He denies dysuria or gross hematuria.  He does have a history of stone disease and for the last 6 weeks has had dull intermittent right flank pain.  There are no identifiable precipitating, aggravating or alleviating factors.  He states his pain is similar to prior stones but not severe.  A PSA performed last week was stable at 1.5 (uncorrected).  A KUB was also performed which shows 2 large stones in the central part of the right kidney measuring 14 and 17 mm.   PMH: Past Medical History:  Diagnosis Date  . Allergy   . BPH (benign prostatic hyperplasia)   . GERD (gastroesophageal reflux disease)   . History of kidney stones   . Hyperlipidemia   . Hypertension   . Renal disorder    kidney stones  . Sleep apnea    CPAP    Surgical History: Past Surgical History:  Procedure Laterality Date  . CARDIAC CATHETERIZATION Left 09/09/2016   Procedure: Left Heart Cath and Coronary Angiography;  Surgeon: Yolonda Kida, MD;  Location: [place] CV LAB;  Service: Cardiovascular;  Laterality: Left;  . COLONOSCOPY  2013   normal/ Dr Vira Agar- cleared for 10 yrs  . COLONOSCOPY WITH PROPOFOL N/A 01/03/2018   Procedure: COLONOSCOPY WITH PROPOFOL;  Surgeon: Manya Silvas, MD;  Location: [facility] ENDOSCOPY;  Service: Endoscopy;  Laterality: N/A;  . CORONARY ARTERY BYPASS GRAFT    .  ESOPHAGOGASTRODUODENOSCOPY (EGD) WITH PROPOFOL N/A 01/03/2018   Procedure: ESOPHAGOGASTRODUODENOSCOPY (EGD) WITH PROPOFOL;  Surgeon: Manya Silvas, MD;  Location: [facility] ENDOSCOPY;  Service: Endoscopy;  Laterality: N/A;  . EYE SURGERY    . HERNIA REPAIR    . kidney stones     lithotripsy  . LITHOTRIPSY    . UPPER GI ENDOSCOPY  2013    Home Medications:  Allergies as of 02/23/2018      Reactions   Bee Venom    Has epi- pen on hand      Medication List        Accurate as of 02/23/18 11:59 PM. Always use your most recent med list.          aspirin EC 81 MG tablet Take 81 mg by mouth daily.   atorvastatin 80 MG tablet Commonly known as:  LIPITOR Take 80 mg by mouth daily.   benazepril-hydrochlorthiazide 10-12.5 MG tablet Commonly known as:  LOTENSIN HCT Take 1 tablet daily by mouth.   co-enzyme Q-10 30 MG capsule Take 30 mg daily by mouth.   EPINEPHrine 0.3 mg/0.3 mL Soaj injection Commonly known as:  EPIPEN 2-PAK Inject 0.3 mLs (0.3 mg total) into the muscle once.   etodolac 400 MG tablet Commonly known as:  LODINE Take 1 tablet (400 mg total) 2 (two) times daily by mouth.   folic acid 1 MG tablet Commonly known as:  FOLVITE Take 1 mg by mouth daily.   glucosamine-chondroitin 500-400 MG tablet Take 2 tablets at bedtime by mouth.   JALYN 0.5-0.4 MG Caps Generic drug:  Dutasteride-Tamsulosin HCl Take 1 capsule by mouth daily. Urology- Stoioff   metoprolol succinate 25 MG 24 hr tablet Commonly known as:  TOPROL-XL Take 1 tablet (25 mg total) daily by mouth.   mometasone 50 MCG/ACT nasal spray Commonly known as:  NASONEX Place 2 sprays daily into the nose.   MULTIVITAMIN GUMMIES ADULT Chew Chew 1 each at bedtime by mouth.   nitroGLYCERIN 0.4 MG SL tablet Commonly known as:  NITROSTAT Place 1 tablet (0.4 mg total) under the tongue every 5 (five) minutes as needed for chest pain.   nortriptyline 10 MG capsule Commonly known as:  PAMELOR Take 2 capsules  at bedtime by mouth. Chipper Herb   pantoprazole 40 MG tablet Commonly known as:  PROTONIX Take 1 tablet (40 mg total) daily by mouth.   senna-docusate 8.6-50 MG tablet Commonly known as:  Senokot-S Take 1 tablet by mouth daily.   sertraline 50 MG tablet Commonly known as:  ZOLOFT Take 0.5 tablets (25 mg total) daily by mouth.       Allergies:  Allergies  Allergen Reactions  . Bee Venom     Has epi- pen on hand    Family History: Family History  Problem Relation Age of Onset  . Arrhythmia Mother   . Coronary artery disease Father   . Diabetes Father   . Hyperlipidemia Brother     Social History:  reports that he has never smoked. He has never used smokeless tobacco. He reports that he does not drink alcohol or use drugs.  ROS: UROLOGY Frequent Urination?: No Hard to postpone urination?: No Burning/pain with urination?: No Get up at night to urinate?: No Leakage of urine?: No Urine stream starts and stops?: No Trouble starting stream?: No Do you have to strain to urinate?: No Blood in urine?: No Urinary tract infection?: No Sexually transmitted disease?: No Injury to kidneys or bladder?: No Painful intercourse?: No Weak stream?: No Erection problems?: No Penile pain?: No  Gastrointestinal Nausea?: No Vomiting?: No Indigestion/heartburn?: No Diarrhea?: No Constipation?: No  Constitutional Fever: No Night sweats?: No Weight loss?: No Fatigue?: No  Skin Skin rash/lesions?: No Itching?: No  Eyes Blurred vision?: No Double vision?: No  Ears/Nose/Throat Sore throat?: No Sinus problems?: No  Hematologic/Lymphatic Swollen glands?: No Easy bruising?: No  Cardiovascular Leg swelling?: No Chest pain?: No  Respiratory Cough?: No Shortness of breath?: No  Endocrine Excessive thirst?: No  Musculoskeletal Back pain?: No Joint pain?: No  Neurological Headaches?: No Dizziness?: No  Psychologic Depression?: No Anxiety?:  No  Physical Exam: BP (!) 151/87   Pulse 84   Ht 5\' 7"  (1.702 m)   Wt 190 lb (86.2 kg)   BMI 29.76 kg/m   Constitutional:  Alert and oriented, No acute distress. HEENT: [place] AT, moist mucus membranes.  Trachea midline, no masses. Cardiovascular: No clubbing, cyanosis, or edema.  RRR Respiratory: Normal respiratory effort, no increased work of breathing.  Lungs clear GI: Abdomen is soft, nontender, nondistended, no abdominal masses GU: No CVA tenderness.  Prostate 40 g, smooth without nodules Lymph: No cervical or inguinal lymphadenopathy. Skin: No rashes, bruises or suspicious lesions. Neurologic: Grossly intact, no focal deficits, moving all 4 extremities. Psychiatric: Normal mood and affect.  Laboratory Data: Lab Results  Component Value Date   WBC 16.5 (H) 10/13/2016   HGB 10.8 (L)  10/13/2016   HCT 32.7 (L) 10/13/2016   MCV 94.4 10/13/2016   PLT 475 (H) 10/13/2016    Lab Results  Component Value Date   CREATININE 0.95 09/28/2017    No results found for: PSA  No results found for: TESTOSTERONE  No results found for: HGBA1C  Urinalysis    Component Value Date/Time   COLORURINE RED (A) 09/06/2016 1717   APPEARANCEUR CLOUDY (A) 09/06/2016 1717   LABSPEC 1.010 09/06/2016 1717   PHURINE  09/06/2016 1717    TEST NOT REPORTED DUE TO COLOR INTERFERENCE OF URINE PIGMENT   GLUCOSEU (A) 09/06/2016 1717    TEST NOT REPORTED DUE TO COLOR INTERFERENCE OF URINE PIGMENT   HGBUR (A) 09/06/2016 1717    TEST NOT REPORTED DUE TO COLOR INTERFERENCE OF URINE PIGMENT   BILIRUBINUR positive 04/08/2017 1128   KETONESUR (A) 09/06/2016 1717    TEST NOT REPORTED DUE TO COLOR INTERFERENCE OF URINE PIGMENT   PROTEINUR trace 04/08/2017 1128   PROTEINUR (A) 09/06/2016 1717    TEST NOT REPORTED DUE TO COLOR INTERFERENCE OF URINE PIGMENT   UROBILINOGEN 0.2 04/08/2017 1128   NITRITE negative 04/08/2017 1128   NITRITE (A) 09/06/2016 1717    TEST NOT REPORTED DUE TO COLOR INTERFERENCE OF  URINE PIGMENT   LEUKOCYTESUR Trace (A) 04/08/2017 1128    Lab Results  Component Value Date   BACTERIA NONE SEEN 09/06/2016    Pertinent Imaging: Images were personally reviewed  Results for orders placed during the hospital encounter of 02/21/18  Abdomen 1 view (KUB)   Narrative CLINICAL DATA:  Kidney stones  EXAM: ABDOMEN - 1 VIEW  COMPARISON:  05/09/2015 lumbar radiography  FINDINGS: Two large lamellated stones over the central right kidney, the larger measuring 17 mm. Possible small faint stones over the right lower pole. No visible ureteral calculus. Left pelvic calcification is stable from prior and likely vascular. Normal bowel gas pattern.  IMPRESSION: At least two right renal calculi that have enlarged from 2016, the largest measuring 17 mm.   Electronically Signed   By: Monte Fantasia M.D.   On: 02/21/2018 15:13     Assessment & Plan:   72 year old male with BPH and history of an elevated PSA.  His PSA is stable and DRE is benign.  Tamsulosin/dutasteride were refilled.  He will continue annual follow-ups.  He has significant stone burden in the right kidney and is mildly symptomatic.  He asked about shockwave lithotripsy and was informed that due to the volume of stone I would not recommend shockwave lithotripsy.  It most likely would take several treatments and the chance of rendering stone free are unlikely.  I discussed ureteroscopy and again due to stone burden this would need to be a staged procedure.  PCNL was also discussed and he was informed although the most invasive procedure is the one which would have the highest success rate at rendering stone free in one procedure although there is no guarantee that all stone fragments could be completely removed.  The procedure was discussed in detail including potential risks of bleeding/hemorrhage requiring transfusion and rarely additional procedures such as embolization and nephrectomy; infection/sepsis;  injury to surrounding structures including bowel, lung and liver.  He would like to think over these options and will call back with his decision.  He did have a abdominal ultrasound 03/2017.  If he desires PCNL would recommend a noncontrast stone protocol CT.    Abbie Sons, [facility] [address]  [address], [place], [place] [address] [phone]

## 2018-02-28 ENCOUNTER — Other Ambulatory Visit: Payer: Self-pay | Admitting: Urology

## 2018-02-28 ENCOUNTER — Other Ambulatory Visit: Payer: Self-pay | Admitting: Radiology

## 2018-02-28 ENCOUNTER — Telehealth: Payer: Self-pay

## 2018-02-28 DIAGNOSIS — N200 Calculus of kidney: Secondary | ICD-10-CM

## 2018-02-28 NOTE — Telephone Encounter (Signed)
LMOM to inform pt of right PCNL scheduled with Dr Bernardo Heater on 03/15/2018 & need for CT prior to surgery.

## 2018-02-28 NOTE — Telephone Encounter (Signed)
-----   Message from Abbie Sons, MD sent at 02/28/2018  7:35 AM EDT ----- PSA stable at 1.5

## 2018-02-28 NOTE — Telephone Encounter (Signed)
Letter sent.

## 2018-03-03 NOTE — Telephone Encounter (Signed)
LMOM to notify pt to hold ASA 81mg  beginning 03/08/2018. Pt may resume medication after surgery.

## 2018-03-08 ENCOUNTER — Other Ambulatory Visit: Payer: Self-pay

## 2018-03-08 ENCOUNTER — Encounter
Admission: RE | Admit: 2018-03-08 | Discharge: 2018-03-08 | Disposition: A | Discharge status: Home / Self Care | Payer: Medicare Other | Source: Ambulatory Visit | Attending: Urology | Admitting: Urology

## 2018-03-08 DIAGNOSIS — N289 Disorder of kidney and ureter, unspecified: Secondary | ICD-10-CM | POA: Diagnosis not present

## 2018-03-08 DIAGNOSIS — K8020 Calculus of gallbladder without cholecystitis without obstruction: Secondary | ICD-10-CM | POA: Diagnosis not present

## 2018-03-08 DIAGNOSIS — G4733 Obstructive sleep apnea (adult) (pediatric): Secondary | ICD-10-CM | POA: Diagnosis present

## 2018-03-08 DIAGNOSIS — E785 Hyperlipidemia, unspecified: Secondary | ICD-10-CM | POA: Diagnosis present

## 2018-03-08 DIAGNOSIS — Z87442 Personal history of urinary calculi: Secondary | ICD-10-CM | POA: Diagnosis not present

## 2018-03-08 DIAGNOSIS — I10 Essential (primary) hypertension: Secondary | ICD-10-CM | POA: Diagnosis present

## 2018-03-08 DIAGNOSIS — N200 Calculus of kidney: Secondary | ICD-10-CM | POA: Diagnosis not present

## 2018-03-08 HISTORY — DX: Atherosclerotic heart disease of native coronary artery without angina pectoris: I25.10

## 2018-03-08 HISTORY — DX: Pneumonia, unspecified organism: J18.9

## 2018-03-08 LAB — CBC
HCT: 43.8 % (ref 40.0–52.0)
Hemoglobin: 14.8 g/dL (ref 13.0–18.0)
MCH: 31.9 pg (ref 26.0–34.0)
MCHC: 33.8 g/dL (ref 32.0–36.0)
MCV: 94.4 fL (ref 80.0–100.0)
PLATELETS: 262 10*3/uL (ref 150–440)
RBC: 4.64 MIL/uL (ref 4.40–5.90)
RDW: 13.7 % (ref 11.5–14.5)
WBC: 6.6 10*3/uL (ref 3.8–10.6)

## 2018-03-08 LAB — BASIC METABOLIC PANEL
ANION GAP: 7 (ref 5–15)
BUN: 16 mg/dL (ref 6–20)
CO2: 27 mmol/L (ref 22–32)
Calcium: 8.9 mg/dL (ref 8.9–10.3)
Chloride: 103 mmol/L (ref 101–111)
Creatinine, Ser: 1.05 mg/dL (ref 0.61–1.24)
GFR calc Af Amer: >60 mL/min (ref >60)
Glucose, Bld: 111 mg/dL — ABNORMAL HIGH (ref 65–99)
Potassium: 3.5 mmol/L (ref 3.5–5.1)
SODIUM: 137 mmol/L (ref 135–145)

## 2018-03-08 LAB — TYPE AND SCREEN
ABO/RH(D): A POS
Antibody Screen: NEGATIVE

## 2018-03-08 NOTE — Patient Instructions (Signed)
  Your procedure is scheduled on: Tuesday March 15, 2018 Report to Same Day Surgery 2nd floor medical mall ([place] Entrance-take elevator on left to 2nd floor.  Check in with surgery information desk.) To find out your arrival time please call [phone] between 1PM - 3PM on Monday March 14, 2018  Remember: Instructions that are not followed completely may result in serious medical risk, up to and including death, or upon the discretion of your surgeon and anesthesiologist your surgery may need to be rescheduled.    _x___ 1. Do not eat food after midnight the night before your procedure. You may drink clear liquids up to 2 hours before you are scheduled to arrive at the hospital for your procedure.  Do not drink clear liquids within 2 hours of your scheduled arrival to the hospital.  Clear liquids include  --Water or Apple juice without pulp  --Clear carbohydrate beverage such as Gatorade  --Black Coffee or Clear Tea (No milk, no creamers, do not add anything to the coffee or tea   No gum chewing or hard candies.      __x__ 2. No Alcohol for 24 hours before or after surgery.   __x__3. No Smoking or e-cigarettes for 24 prior to surgery.  Do not use any chewable tobacco products for at least 6 hour prior to surgery   ____  4. Bring all medications with you on the day of surgery if instructed.    __x__ 5. Notify your doctor if there is any change in your medical condition     (cold, fever, infections).   __x__6. On the morning of surgery brush your teeth with toothpaste and water.  You may rinse your mouth with mouth wash if you wish.  Do not swallow any toothpaste or mouthwash.   Do not wear jewelry.  Do not wear lotions, powders, deodorant, or perfumes.   Do not shave 48 hours prior to surgery. Men can shave their face and neck.  Do not bring valuables to the hospital.    [facility] is not responsible for any belongings or valuables.               Contacts, dentures or  bridgework may not be worn into surgery.  Leave your suitcase in the car. After surgery it may be brought to your room.  For patients admitted to the hospital, discharge time is determined by your treatment team.  Please read over the following fact sheets that you were given:   [facility] Preparing for Surgery and or MRSA Information   _x___ Take anti-hypertensive listed below, cardiac, seizure, asthma, anti-reflux and psychiatric medicines. These include:  1. Atorvastatin/Lipitor  2. Metoprolol/Toprol  3. Pantoprazole/Protonix  4. Sertraline/Zoloft  _x___ Use CHG Soap or sage wipes as directed on instruction sheet   _x___ Follow recommendations from Cardiologist, Pulmonologist or PCP regarding stopping Aspirin, Coumadin, Plavix ,Eliquis, Effient, or Pradaxa, and Pletal.  _x___Stop Anti-inflammatories such as Advil, Aleve, Ibuprofen, Motrin, Naproxen, Naprosyn, Goodies powders or aspirin products. OK to take Tylenol and Celebrex. Stop Aspirin, Ibuprofen, and Etodolac/Lodine until after surgery.   _x___ Stop supplements (CoQ10/Coenzyme Q10, Glucosamine) until after surgery. But may continue Vitamin D, Vitamin B, and multivitamin.  _x___ Bring C-Pap to the hospital.

## 2018-03-09 LAB — URINE CULTURE: Culture: NO GROWTH

## 2018-03-10 ENCOUNTER — Ambulatory Visit
Admission: RE | Admit: 2018-03-10 | Discharge: 2018-03-10 | Disposition: A | Discharge status: Home / Self Care | Payer: Medicare Other | Source: Ambulatory Visit | Attending: Urology | Admitting: Urology

## 2018-03-10 DIAGNOSIS — K8020 Calculus of gallbladder without cholecystitis without obstruction: Secondary | ICD-10-CM | POA: Insufficient documentation

## 2018-03-10 DIAGNOSIS — N289 Disorder of kidney and ureter, unspecified: Secondary | ICD-10-CM | POA: Insufficient documentation

## 2018-03-10 DIAGNOSIS — E785 Hyperlipidemia, unspecified: Secondary | ICD-10-CM | POA: Insufficient documentation

## 2018-03-10 DIAGNOSIS — I10 Essential (primary) hypertension: Secondary | ICD-10-CM | POA: Insufficient documentation

## 2018-03-10 DIAGNOSIS — G4733 Obstructive sleep apnea (adult) (pediatric): Secondary | ICD-10-CM | POA: Insufficient documentation

## 2018-03-10 DIAGNOSIS — N200 Calculus of kidney: Secondary | ICD-10-CM | POA: Insufficient documentation

## 2018-03-10 DIAGNOSIS — Z87442 Personal history of urinary calculi: Secondary | ICD-10-CM | POA: Insufficient documentation

## 2018-03-14 ENCOUNTER — Other Ambulatory Visit: Payer: Self-pay | Admitting: Radiology

## 2018-03-14 ENCOUNTER — Other Ambulatory Visit: Payer: Self-pay | Admitting: Student

## 2018-03-14 MED ORDER — CEFAZOLIN SODIUM-DEXTROSE 2-4 GM/100ML-% IV SOLN
2.0000 g | INTRAVENOUS | Status: AC
  Administered 2018-03-15: 2 g via INTRAVENOUS
  Filled 2018-03-14: qty 100

## 2018-03-15 ENCOUNTER — Ambulatory Visit: Payer: Medicare Other

## 2018-03-15 ENCOUNTER — Ambulatory Visit
Admission: RE | Admit: 2018-03-15 | Discharge: 2018-03-15 | Disposition: A | Discharge status: Home / Self Care | Payer: Medicare Other | Source: Ambulatory Visit | Attending: Urology | Admitting: Urology

## 2018-03-15 ENCOUNTER — Ambulatory Visit: Payer: Medicare Other | Admitting: Anesthesiology

## 2018-03-15 ENCOUNTER — Encounter
Admission: RE | Disposition: A | Discharge status: Home / Self Care | Payer: Self-pay | Source: Ambulatory Visit | Attending: Urology

## 2018-03-15 ENCOUNTER — Observation Stay
Admission: RE | Admit: 2018-03-15 | Discharge: 2018-03-16 | Disposition: A | Discharge status: Home / Self Care | Payer: Medicare Other | Source: Ambulatory Visit | Attending: Urology | Admitting: Urology

## 2018-03-15 ENCOUNTER — Other Ambulatory Visit: Payer: Self-pay

## 2018-03-15 ENCOUNTER — Encounter: Payer: Self-pay | Admitting: *Deleted

## 2018-03-15 DIAGNOSIS — Z7982 Long term (current) use of aspirin: Secondary | ICD-10-CM | POA: Diagnosis not present

## 2018-03-15 DIAGNOSIS — M1990 Unspecified osteoarthritis, unspecified site: Secondary | ICD-10-CM | POA: Diagnosis not present

## 2018-03-15 DIAGNOSIS — N1330 Unspecified hydronephrosis: Secondary | ICD-10-CM

## 2018-03-15 DIAGNOSIS — Z951 Presence of aortocoronary bypass graft: Secondary | ICD-10-CM | POA: Diagnosis not present

## 2018-03-15 DIAGNOSIS — I10 Essential (primary) hypertension: Secondary | ICD-10-CM | POA: Insufficient documentation

## 2018-03-15 DIAGNOSIS — K219 Gastro-esophageal reflux disease without esophagitis: Secondary | ICD-10-CM | POA: Insufficient documentation

## 2018-03-15 DIAGNOSIS — N200 Calculus of kidney: Secondary | ICD-10-CM

## 2018-03-15 DIAGNOSIS — G4730 Sleep apnea, unspecified: Secondary | ICD-10-CM | POA: Diagnosis not present

## 2018-03-15 DIAGNOSIS — N400 Enlarged prostate without lower urinary tract symptoms: Secondary | ICD-10-CM | POA: Insufficient documentation

## 2018-03-15 DIAGNOSIS — I2510 Atherosclerotic heart disease of native coronary artery without angina pectoris: Secondary | ICD-10-CM | POA: Insufficient documentation

## 2018-03-15 DIAGNOSIS — Z8249 Family history of ischemic heart disease and other diseases of the circulatory system: Secondary | ICD-10-CM | POA: Diagnosis not present

## 2018-03-15 DIAGNOSIS — Z7951 Long term (current) use of inhaled steroids: Secondary | ICD-10-CM | POA: Insufficient documentation

## 2018-03-15 DIAGNOSIS — E785 Hyperlipidemia, unspecified: Secondary | ICD-10-CM | POA: Diagnosis not present

## 2018-03-15 DIAGNOSIS — Z419 Encounter for procedure for purposes other than remedying health state, unspecified: Secondary | ICD-10-CM

## 2018-03-15 HISTORY — PX: NEPHROLITHOTOMY: SHX5134

## 2018-03-15 HISTORY — PX: IR NEPHROSTOMY PLACEMENT RIGHT: IMG6064

## 2018-03-15 LAB — CBC
HCT: 44.2 % (ref 40.0–52.0)
Hemoglobin: 15 g/dL (ref 13.0–18.0)
MCH: 32 pg (ref 26.0–34.0)
MCHC: 34 g/dL (ref 32.0–36.0)
MCV: 94.2 fL (ref 80.0–100.0)
PLATELETS: 260 10*3/uL (ref 150–440)
RBC: 4.69 MIL/uL (ref 4.40–5.90)
RDW: 13.6 % (ref 11.5–14.5)
WBC: 8.4 10*3/uL (ref 3.8–10.6)

## 2018-03-15 LAB — BASIC METABOLIC PANEL
Anion gap: 6 (ref 5–15)
BUN: 14 mg/dL (ref 6–20)
CALCIUM: 8.9 mg/dL (ref 8.9–10.3)
CO2: 27 mmol/L (ref 22–32)
Chloride: 104 mmol/L (ref 101–111)
Creatinine, Ser: 1.13 mg/dL (ref 0.61–1.24)
GFR calc Af Amer: >60 mL/min (ref >60)
Glucose, Bld: 133 mg/dL — ABNORMAL HIGH (ref 65–99)
POTASSIUM: 3.5 mmol/L (ref 3.5–5.1)
Sodium: 137 mmol/L (ref 135–145)

## 2018-03-15 LAB — PROTIME-INR
INR: 1.01
Prothrombin Time: 13.2 seconds (ref 11.4–15.2)

## 2018-03-15 LAB — ABO/RH: ABO/RH(D): A POS

## 2018-03-15 SURGERY — NEPHROLITHOTOMY PERCUTANEOUS
Anesthesia: General | Site: Back | Laterality: Right | Wound class: "Clean "

## 2018-03-15 MED ORDER — LACTATED RINGERS IV SOLN
INTRAVENOUS | Status: DC
  Administered 2018-03-15 (×2): via INTRAVENOUS

## 2018-03-15 MED ORDER — MIDAZOLAM HCL 5 MG/5ML IJ SOLN
INTRAMUSCULAR | Status: AC | PRN
  Administered 2018-03-15: 1 mg via INTRAVENOUS

## 2018-03-15 MED ORDER — PHENYLEPHRINE HCL 10 MG/ML IJ SOLN
INTRAMUSCULAR | Status: AC
  Filled 2018-03-15: qty 1

## 2018-03-15 MED ORDER — METHYLPREDNISOLONE SODIUM SUCC 125 MG IJ SOLR
INTRAMUSCULAR | Status: AC
  Filled 2018-03-15: qty 2

## 2018-03-15 MED ORDER — HYDROMORPHONE HCL 1 MG/ML IJ SOLN
INTRAMUSCULAR | Status: AC
  Administered 2018-03-15: 0.5 mg via INTRAVENOUS
  Filled 2018-03-15: qty 1

## 2018-03-15 MED ORDER — SUGAMMADEX SODIUM 200 MG/2ML IV SOLN
INTRAVENOUS | Status: AC
  Filled 2018-03-15: qty 2

## 2018-03-15 MED ORDER — FENTANYL CITRATE (PF) 100 MCG/2ML IJ SOLN
INTRAMUSCULAR | Status: DC | PRN
  Administered 2018-03-15: 100 ug via INTRAVENOUS
  Administered 2018-03-15: 50 ug via INTRAVENOUS

## 2018-03-15 MED ORDER — CEFAZOLIN SODIUM-DEXTROSE 2-3 GM-%(50ML) IV SOLR
INTRAVENOUS | Status: AC
  Filled 2018-03-15: qty 50

## 2018-03-15 MED ORDER — HYDROCHLOROTHIAZIDE 12.5 MG PO CAPS
12.5000 mg | ORAL_CAPSULE | Freq: Every day | ORAL | Status: DC
Start: 2018-03-16 — End: 2018-03-16
  Administered 2018-03-16: 12.5 mg via ORAL
  Filled 2018-03-15: qty 1

## 2018-03-15 MED ORDER — LIDOCAINE HCL (PF) 1 % IJ SOLN
INTRAMUSCULAR | Status: AC
  Filled 2018-03-15: qty 30

## 2018-03-15 MED ORDER — MIDAZOLAM HCL 5 MG/5ML IJ SOLN
INTRAMUSCULAR | Status: AC
  Filled 2018-03-15: qty 5

## 2018-03-15 MED ORDER — LIDOCAINE HCL (PF) 2 % IJ SOLN
INTRAMUSCULAR | Status: AC
  Filled 2018-03-15: qty 10

## 2018-03-15 MED ORDER — BENAZEPRIL HCL 10 MG PO TABS
10.0000 mg | ORAL_TABLET | Freq: Every day | ORAL | Status: DC
Start: 2018-03-16 — End: 2018-03-16
  Administered 2018-03-16: 10 mg via ORAL
  Filled 2018-03-15: qty 1

## 2018-03-15 MED ORDER — BELLADONNA ALKALOIDS-OPIUM 16.2-60 MG RE SUPP
1.0000 | Freq: Four times a day (QID) | RECTAL | Status: DC | PRN
  Administered 2018-03-15: 1 via RECTAL

## 2018-03-15 MED ORDER — FENTANYL CITRATE (PF) 100 MCG/2ML IJ SOLN
INTRAMUSCULAR | Status: AC
  Administered 2018-03-15: 50 ug via INTRAVENOUS
  Filled 2018-03-15: qty 2

## 2018-03-15 MED ORDER — OXYBUTYNIN CHLORIDE 5 MG PO TABS: 5.0000 mg | ORAL_TABLET | Freq: Three times a day (TID) | ORAL | Status: DC | PRN

## 2018-03-15 MED ORDER — DIPHENHYDRAMINE HCL 50 MG/ML IJ SOLN
50.0000 mg | Freq: Once | INTRAMUSCULAR | Status: AC
  Administered 2018-03-15: 50 mg via INTRAVENOUS

## 2018-03-15 MED ORDER — FENTANYL CITRATE (PF) 100 MCG/2ML IJ SOLN
50.0000 ug | Freq: Once | INTRAMUSCULAR | Status: AC
  Administered 2018-03-15: 50 ug via INTRAVENOUS

## 2018-03-15 MED ORDER — LACTATED RINGERS IV SOLN
INTRAVENOUS | Status: DC
  Administered 2018-03-16: 09:00:00 via INTRAVENOUS

## 2018-03-15 MED ORDER — SODIUM CHLORIDE 0.9 % IV SOLN
INTRAVENOUS | Status: DC | PRN
  Administered 2018-03-15: 30 ug/min via INTRAVENOUS

## 2018-03-15 MED ORDER — CIPROFLOXACIN IN D5W 400 MG/200ML IV SOLN
INTRAVENOUS | Status: AC
  Filled 2018-03-15: qty 200

## 2018-03-15 MED ORDER — FENTANYL CITRATE (PF) 100 MCG/2ML IJ SOLN
INTRAMUSCULAR | Status: AC
  Filled 2018-03-15: qty 2

## 2018-03-15 MED ORDER — BELLADONNA ALKALOIDS-OPIUM 16.2-60 MG RE SUPP
RECTAL | Status: AC
  Filled 2018-03-15: qty 1

## 2018-03-15 MED ORDER — ACETAMINOPHEN 10 MG/ML IV SOLN
INTRAVENOUS | Status: DC | PRN
  Administered 2018-03-15: 1000 mg via INTRAVENOUS

## 2018-03-15 MED ORDER — ONDANSETRON HCL 4 MG/2ML IJ SOLN
INTRAMUSCULAR | Status: AC
  Filled 2018-03-15: qty 2

## 2018-03-15 MED ORDER — ROCURONIUM BROMIDE 100 MG/10ML IV SOLN
INTRAVENOUS | Status: DC | PRN
  Administered 2018-03-15: 40 mg via INTRAVENOUS
  Administered 2018-03-15: 10 mg via INTRAVENOUS

## 2018-03-15 MED ORDER — CIPROFLOXACIN IN D5W 400 MG/200ML IV SOLN
400.0000 mg | INTRAVENOUS | Status: AC
  Administered 2018-03-15: 08:00:00 via INTRAVENOUS

## 2018-03-15 MED ORDER — HYDROCODONE-ACETAMINOPHEN 5-325 MG PO TABS
1.0000 | ORAL_TABLET | ORAL | Status: DC | PRN
  Administered 2018-03-15 (×2): 1 via ORAL
  Administered 2018-03-16 (×2): 2 via ORAL
  Filled 2018-03-15 (×2): qty 1
  Filled 2018-03-15 (×2): qty 2

## 2018-03-15 MED ORDER — FENTANYL CITRATE (PF) 100 MCG/2ML IJ SOLN
INTRAMUSCULAR | Status: AC | PRN
  Administered 2018-03-15: 25 ug via INTRAVENOUS

## 2018-03-15 MED ORDER — IOTHALAMATE MEGLUMINE 43 % IV SOLN
INTRAVENOUS | Status: DC | PRN
  Administered 2018-03-15: 100 mL

## 2018-03-15 MED ORDER — SODIUM CHLORIDE 0.9 % IV SOLN: INTRAVENOUS | Status: DC

## 2018-03-15 MED ORDER — HYDROMORPHONE HCL 1 MG/ML IJ SOLN
0.5000 mg | INTRAMUSCULAR | Status: DC | PRN
  Administered 2018-03-15 (×2): 0.5 mg via INTRAVENOUS

## 2018-03-15 MED ORDER — PROPOFOL 10 MG/ML IV BOLUS
INTRAVENOUS | Status: AC
  Filled 2018-03-15: qty 20

## 2018-03-15 MED ORDER — CEFAZOLIN SODIUM-DEXTROSE 1-4 GM/50ML-% IV SOLN
1.0000 g | Freq: Three times a day (TID) | INTRAVENOUS | Status: AC
  Administered 2018-03-15 – 2018-03-16 (×2): 1 g via INTRAVENOUS
  Filled 2018-03-15 (×2): qty 50

## 2018-03-15 MED ORDER — DIPHENHYDRAMINE HCL 50 MG/ML IJ SOLN
INTRAMUSCULAR | Status: AC
  Filled 2018-03-15: qty 1

## 2018-03-15 MED ORDER — METOPROLOL SUCCINATE ER 25 MG PO TB24
25.0000 mg | ORAL_TABLET | Freq: Every day | ORAL | Status: DC
  Administered 2018-03-16: 25 mg via ORAL
  Filled 2018-03-15: qty 1

## 2018-03-15 MED ORDER — PANTOPRAZOLE SODIUM 40 MG PO TBEC
40.0000 mg | DELAYED_RELEASE_TABLET | Freq: Every day | ORAL | Status: DC
  Administered 2018-03-16: 40 mg via ORAL
  Filled 2018-03-15: qty 1

## 2018-03-15 MED ORDER — NITROGLYCERIN 0.4 MG SL SUBL: 0.4000 mg | SUBLINGUAL_TABLET | SUBLINGUAL | Status: DC | PRN

## 2018-03-15 MED ORDER — PHENYLEPHRINE HCL 10 MG/ML IJ SOLN
INTRAMUSCULAR | Status: DC | PRN
  Administered 2018-03-15 (×5): 100 ug via INTRAVENOUS

## 2018-03-15 MED ORDER — ATORVASTATIN CALCIUM 20 MG PO TABS
80.0000 mg | ORAL_TABLET | Freq: Every day | ORAL | Status: DC
  Administered 2018-03-16: 80 mg via ORAL
  Filled 2018-03-15: qty 8

## 2018-03-15 MED ORDER — BENAZEPRIL-HYDROCHLOROTHIAZIDE 10-12.5 MG PO TABS: 1.0000 | ORAL_TABLET | Freq: Every day | ORAL | Status: DC

## 2018-03-15 MED ORDER — FLUTICASONE PROPIONATE 50 MCG/ACT NA SUSP
1.0000 | Freq: Every day | NASAL | Status: DC
  Administered 2018-03-15 – 2018-03-16 (×2): 1 via NASAL
  Filled 2018-03-15: qty 16

## 2018-03-15 MED ORDER — MORPHINE SULFATE (PF) 2 MG/ML IV SOLN
2.0000 mg | INTRAVENOUS | Status: DC | PRN
  Administered 2018-03-15 – 2018-03-16 (×2): 4 mg via INTRAVENOUS
  Filled 2018-03-15 (×2): qty 2

## 2018-03-15 MED ORDER — FENTANYL CITRATE (PF) 100 MCG/2ML IJ SOLN
25.0000 ug | INTRAMUSCULAR | Status: DC | PRN
  Administered 2018-03-15 (×4): 25 ug via INTRAVENOUS

## 2018-03-15 MED ORDER — NORTRIPTYLINE HCL 10 MG PO CAPS
20.0000 mg | ORAL_CAPSULE | Freq: Every day | ORAL | Status: DC
  Administered 2018-03-15: 20 mg via ORAL
  Filled 2018-03-15 (×2): qty 2

## 2018-03-15 MED ORDER — IOPAMIDOL (ISOVUE-300) INJECTION 61%
30.0000 mL | Freq: Once | INTRAVENOUS | Status: AC | PRN
  Administered 2018-03-15: 10 mL

## 2018-03-15 MED ORDER — ONDANSETRON HCL 4 MG/2ML IJ SOLN
4.0000 mg | Freq: Once | INTRAMUSCULAR | Status: AC | PRN
  Administered 2018-03-15: 4 mg via INTRAVENOUS

## 2018-03-15 MED ORDER — DIPHENHYDRAMINE HCL 50 MG/ML IJ SOLN
INTRAMUSCULAR | Status: AC
  Administered 2018-03-15: 50 mg via INTRAVENOUS
  Filled 2018-03-15: qty 1

## 2018-03-15 MED ORDER — ACETAMINOPHEN NICU IV SYRINGE 10 MG/ML
INTRAVENOUS | Status: AC
  Filled 2018-03-15: qty 1

## 2018-03-15 MED ORDER — LIDOCAINE HCL (CARDIAC) PF 100 MG/5ML IV SOSY
PREFILLED_SYRINGE | INTRAVENOUS | Status: DC | PRN
  Administered 2018-03-15: 100 mg via INTRAVENOUS

## 2018-03-15 MED ORDER — SUGAMMADEX SODIUM 200 MG/2ML IV SOLN
INTRAVENOUS | Status: DC | PRN
  Administered 2018-03-15: 200 mg via INTRAVENOUS

## 2018-03-15 MED ORDER — FENTANYL CITRATE (PF) 100 MCG/2ML IJ SOLN
INTRAMUSCULAR | Status: AC
  Administered 2018-03-15: 25 ug via INTRAVENOUS
  Filled 2018-03-15: qty 2

## 2018-03-15 MED ORDER — ONDANSETRON HCL 4 MG/2ML IJ SOLN
INTRAMUSCULAR | Status: DC | PRN
  Administered 2018-03-15: 4 mg via INTRAVENOUS

## 2018-03-15 MED ORDER — ROCURONIUM BROMIDE 50 MG/5ML IV SOLN
INTRAVENOUS | Status: AC
  Filled 2018-03-15: qty 1

## 2018-03-15 MED ORDER — PROPOFOL 10 MG/ML IV BOLUS
INTRAVENOUS | Status: DC | PRN
  Administered 2018-03-15: 150 mg via INTRAVENOUS

## 2018-03-15 SURGICAL SUPPLY — 64 items
ADAPTER IRRIG TUBE 2 SPIKE SOL (ADAPTER) ×4 IMPLANT
ADAPTER SCOPE UROLOK II (MISCELLANEOUS) ×2 IMPLANT
BAG URINE DRAINAGE (UROLOGICAL SUPPLIES) ×2 IMPLANT
BALLN NEPHROSTOMY 10X15 (UROLOGICAL SUPPLIES) ×2 IMPLANT
BASKET ZERO TIP 1.9FR (BASKET) IMPLANT
BLADE SURG 15 STRL LF DISP TIS (BLADE) ×1 IMPLANT
BLADE SURG 15 STRL SS (BLADE) ×1
CATH COUNCIL 22FR (CATHETERS) IMPLANT
CATH FOLEY 2W COUNCIL 20FR 5CC (CATHETERS) IMPLANT
CATH FOLEY 2W COUNCIL 5CC 16FR (CATHETERS) ×1 IMPLANT
CATH FOLEY 2W COUNCIL 5CC 18FR (CATHETERS) IMPLANT
CATH STENT KAYE NEPHR TAMP (CATHETERS) IMPLANT
CATH URETL 5X70 OPEN END (CATHETERS) ×2 IMPLANT
CATH/STENT KAYE NEPHR TAMP (CATHETERS)
CHLORAPREP W/TINT 26ML (MISCELLANEOUS) ×2 IMPLANT
CNTNR SPEC 2.5X3XGRAD LEK (MISCELLANEOUS) ×1
CONRAY 43 FOR UROLOGY 50M (MISCELLANEOUS) ×6 IMPLANT
CONT SPEC 4OZ STER OR WHT (MISCELLANEOUS) ×1
CONTAINER SPEC 2.5X3XGRAD LEK (MISCELLANEOUS) ×1 IMPLANT
DRAPE C-ARM XRAY 36X54 (DRAPES) ×2 IMPLANT
DRAPE SHEET LG 3/4 BI-LAMINATE (DRAPES) ×2 IMPLANT
DRAPE SURG 17X11 SM STRL (DRAPES) ×8 IMPLANT
GAUZE SPONGE 4X4 12PLY STRL (GAUZE/BANDAGES/DRESSINGS) ×2 IMPLANT
GLIDEWIRE STIFF .35X180X3 HYDR (WIRE) ×2 IMPLANT
GLOVE BIO SURGEON STRL SZ8 (GLOVE) ×2 IMPLANT
GOWN STRL REUS W/ TWL LRG LVL3 (GOWN DISPOSABLE) ×2 IMPLANT
GOWN STRL REUS W/ TWL XL LVL3 (GOWN DISPOSABLE) ×1 IMPLANT
GOWN STRL REUS W/TWL LRG LVL3 (GOWN DISPOSABLE) ×2
GOWN STRL REUS W/TWL XL LVL3 (GOWN DISPOSABLE) ×1
GUIDEWIRE GREEN .038 145CM (MISCELLANEOUS) ×1 IMPLANT
GUIDEWIRE INTRO SET STRAIGHT (WIRE) ×2 IMPLANT
GUIDEWIRE STR ZIPWIRE 035X150 (MISCELLANEOUS) IMPLANT
GUIDEWIRE SUPER STIFF .035X180 (WIRE) IMPLANT
HOLDER FOLEY CATH W/STRAP (MISCELLANEOUS) ×2 IMPLANT
INTRODUCER DILATOR DOUBLE (INTRODUCER) IMPLANT
MANIFOLD NEPTUNE II (INSTRUMENTS) ×2 IMPLANT
MAT BLUE FLOOR 46X72 FLO (MISCELLANEOUS) ×4 IMPLANT
NDL FASCIA INCISION 18GA (NEEDLE) ×2 IMPLANT
PACK BASIN MINOR ARMC (MISCELLANEOUS) ×2 IMPLANT
PAD ABD DERMACEA PRESS 5X9 (GAUZE/BANDAGES/DRESSINGS) ×4 IMPLANT
PAD PREP 24X41 OB/GYN DISP (PERSONAL CARE ITEMS) ×2 IMPLANT
PROBE CYBERWAND SET (MISCELLANEOUS) ×3 IMPLANT
SENSORWIRE 0.038 NOT ANGLED (WIRE) ×2
SET IRRIGATING DISP (SET/KITS/TRAYS/PACK) ×2 IMPLANT
SHEET NEURO XL SOL CTL (MISCELLANEOUS) ×2 IMPLANT
SOL .9 NS 3000ML IRR  AL (IV SOLUTION) ×4
SOL .9 NS 3000ML IRR UROMATIC (IV SOLUTION) ×4 IMPLANT
SPONGE DRAIN TRACH 4X4 STRL 2S (GAUZE/BANDAGES/DRESSINGS) ×2 IMPLANT
STENT URET 6FRX24 CONTOUR (STENTS) IMPLANT
STENT URET 6FRX26 CONTOUR (STENTS) ×1 IMPLANT
STRAP SAFETY 5IN WIDE (MISCELLANEOUS) ×4 IMPLANT
SUT SILK 0 SH 30 (SUTURE) ×2 IMPLANT
SYR 10ML LL (SYRINGE) ×2 IMPLANT
SYR 20CC LL (SYRINGE) ×2 IMPLANT
SYR 30ML LL (SYRINGE) ×2 IMPLANT
SYRINGE IRR TOOMEY STRL 70CC (SYRINGE) ×2 IMPLANT
TAPE CLOTH 10X20 WHT NS LF (TAPE) ×1 IMPLANT
TAPE CLOTH 2X10 WHT NS LF (TAPE) ×1
TAPE MICROFOAM 4IN (TAPE) ×2 IMPLANT
TRAP SPECIMEN MUCOUS 40CC (MISCELLANEOUS) IMPLANT
TRAY FOLEY W/METER SILVER 16FR (SET/KITS/TRAYS/PACK) ×2 IMPLANT
TUBING CONNECTING 10 (TUBING) ×3 IMPLANT
WATER STERILE IRR 1000ML POUR (IV SOLUTION) ×2 IMPLANT
WIRE SENSOR 0.038 NOT ANGLED (WIRE) ×1 IMPLANT

## 2018-03-15 NOTE — Op Note (Signed)
Preoperative diagnosis:  1. Right renal pelvic calculi    Postoperative diagnosis:  1. Right renal pelvic calculi   Procedure: 1. Right PCNL 2. Right antegrade nephrostogram 3. Right nephrostomy tube placement 4. Right double-J ureteral stent placement 5. Interpretation of fluoroscopy less than 30 minutes   Surgeon: John Giovanni, MD   Anesthesia: General   Complications: None   Intraoperative findings:  1. Two renal pelvic calculi measuring 10 mm and 17 mm. Surgery otherwise uncomplicated. 2. Antegrade nephrostogram shows no extravasation or remaining stone fragments.   EBL: 100 mL   Specimens: Stone fragments for analysis   Drains: 6 FR 26 cm double-J ureteral stent, 16 Foley catheter per bladder, 16 French Foley catheter nephrostomy tube   Indication: Timothy Hatfield is a 72 year old male with right renal pelvic calculi measuring 10 and 17 mm with intermittent right flank pain.  He was taken to interventional radiology where posterior lower pole access was achieved in the form of nephroureteral catheter. He returned upstairs for the above procedure..  After reviewing the management options for treatment, the patient elected to proceed with the above surgical procedure(s). We have discussed the potential benefits and risks of the procedure, side effects of the proposed treatment, the likelihood of the patient achieving the goals of the procedure, and any potential problems that might occur during the procedure or recuperation. Informed consent has been obtained.   Description of procedure:   The patient was brought to the operating room at which time general anesthesia was induced on the stretcher.  A 16 French Foley catheter was then placed.  He was then repositioned on the operating room table in the prone position with extreme care to pad all pressure points.  He was then secured to the table and prepped and draped in the standard sterile fashion.   Scout imaging revealed a  nephroureteral catheter down to level of the bladder traversing a lower pole calyx adjacent to the stone which could easily be seen on fluoroscopy.  The preplaced catheter was then cannulated using a Super Stiff wire down to level the bladder under fluoroscopic guidance.  The wire was left in place in the nephroureteral catheter was removed.  The skin was incised approximately 2 cm in diameter in order to accommodate sheath. Using a safety wire introducer, a second sensor wire was then introduced down to level of the bladder as well without difficulty.    A 30 FR NephroMax balloon was then introduced to the level of the stone under fluoroscopic guidance.  The balloon was inflated up to 18 cm H2O and left in place for several minutes for hemostasis. The sheath was then advanced over the balloon to the level of the stone without difficulty.  The balloon was then deflated and removed.  The working wire was snapped into the sheath.  The nephroscope was then advanced through the sheath into the calyx and the 2 calculi were identified in the renal pelvis.  The CyberWand was then placed through the nephroscope and this calculi were easily fragmented. Two larger fragments were grasped with forceps and sent for analysis.  The nephroscope was removed and a flexible cystoscope was placed to the sheath.  Two small fragments were identified in the proximal ureter and removed with a 1.9 [place] nitinol basket.  Antegrade nephrostogram was then performed with findings as described above.  The cystoscope was replaced and all calyces were examined.  No remaining fragments were identified.  A 6 French/26 cm JJ ureteral stent was  placed under fluoroscopic guidance down to level the bladder.  The working wire was withdrawn and a full coils noted at the level of the bladder past the midline consistent with a curl in the lumen of the bladder.  The wire was then fully withdrawn and a partial coil was noted presumably within the renal  pelvis.  The flexible cystoscope was repassed and the stent was in the renal pelvis with the tip positioned in a lower pole calyx.  An 31 French [place] tip catheter was advanced through the sheath into the renal pelvis.  The balloon was filled with 1.5 cc of partial contrast solution.  An antegrade nephrostogram then confirmed both the stent and nephrostomy tubes position within the collecting systems and the calyces field without extravasation. The sheath was then removed leaving both the stent and nephrostomy tube in place.  This was then secured in place using silk sutures x2.  A dressing of 4 x 4, ABD pad, and foam tape was applied.  The patient was then repositioned in the supine position back on the stretcher and reversed from anesthesia.  She/He was taken the PACU in stable condition.   Plan: The patient will be admitted overnight for observation and labs. The nephrostomy tube will be capped in the morning and removed prior to discharge if no complications.  Office follow-up will be scheduled in 1-2 weeks for stent removal.       John Giovanni, M.D.

## 2018-03-15 NOTE — Anesthesia Post-op Follow-up Note (Signed)
Anesthesia QCDR form completed.        

## 2018-03-15 NOTE — H&P (Signed)
Chief Complaint: Right renal calculi  Referring Physician(s):  Stoioff    History of Present Illness: Timothy Hatfield is a 72 y.o. male with bilateral renal calculi, larger on the right. Here for neph tube access prior to operative PNL. No complaints today.  Past Medical History:  Diagnosis Date  . Allergy   . BPH (benign prostatic hyperplasia)   . Coronary artery disease   . GERD (gastroesophageal reflux disease)   . History of kidney stones   . Hyperlipidemia   . Hypertension   . Pneumonia   . Renal disorder    kidney stones  . Sleep apnea    CPAP    Past Surgical History:  Procedure Laterality Date  . CARDIAC CATHETERIZATION Left 09/09/2016   Procedure: Left Heart Cath and Coronary Angiography;  Surgeon: Yolonda Kida, MD;  Location: [facility];  Service: Cardiovascular;  Laterality: Left;  . COLONOSCOPY  2013   normal/ Dr Vira Agar- cleared for 10 yrs  . COLONOSCOPY WITH PROPOFOL N/A 01/03/2018   Procedure: COLONOSCOPY WITH PROPOFOL;  Surgeon: Manya Silvas, MD;  Location: [facility] ENDOSCOPY;  Service: Endoscopy;  Laterality: N/A;  . CORONARY ARTERY BYPASS GRAFT    . ESOPHAGOGASTRODUODENOSCOPY (EGD) WITH PROPOFOL N/A 01/03/2018   Procedure: ESOPHAGOGASTRODUODENOSCOPY (EGD) WITH PROPOFOL;  Surgeon: Manya Silvas, MD;  Location: [facility] ENDOSCOPY;  Service: Endoscopy;  Laterality: N/A;  . EYE SURGERY    . HERNIA REPAIR    . kidney stones     lithotripsy  . LITHOTRIPSY    . UPPER GI ENDOSCOPY  2013    Allergies: Bee venom  Medications: Prior to Admission medications   Medication Sig Start Date End Date Taking? Authorizing Provider  acetaminophen (TYLENOL) 500 MG tablet Take 1,000 mg by mouth daily as needed for moderate pain.   Yes [provider]  aspirin EC 81 MG tablet Take 81 mg by mouth daily.   Yes [provider]  atorvastatin (LIPITOR) 80 MG tablet Take 80 mg by mouth daily. 12/26/17  Yes [provider]    benazepril-hydrochlorthiazide (LOTENSIN HCT) 10-12.5 MG tablet Take 1 tablet daily by mouth. 09/28/17  Yes Juline Patch, MD  Coenzyme Q10 (COQ-10) 100 MG CAPS Take 100 mg by mouth at bedtime.   Yes [provider]  Dutasteride-Tamsulosin HCl (JALYN) 0.5-0.4 MG CAPS Take 1 capsule by mouth daily. Urology- [facility] Patient taking differently: Take 1 capsule by mouth every Monday, Wednesday, and Friday. Urology- [facility] 02/27/18  Yes Stoioff, Ronda Fairly, MD  etodolac (LODINE) 400 MG tablet Take 1 tablet (400 mg total) 2 (two) times daily by mouth. Patient taking differently: Take 400 mg by mouth daily.  09/28/17  Yes Juline Patch, MD  ibuprofen (ADVIL,MOTRIN) 200 MG tablet Take 400 mg by mouth daily as needed for moderate pain.   Yes [provider]  metoprolol succinate (TOPROL-XL) 25 MG 24 hr tablet Take 1 tablet (25 mg total) daily by mouth. 09/28/17  Yes Juline Patch, MD  Misc Natural Products (GLUCOSAMINE CHOND DOUBLE STR PO) Take 2 tablets by mouth at bedtime.   Yes [provider]  mometasone (NASONEX) 50 MCG/ACT nasal spray Place 2 sprays daily into the nose. Patient taking differently: Place 1 spray into the nose daily.  09/28/17  Yes Juline Patch, MD  Multiple Vitamin (MULTIVITAMIN WITH MINERALS) TABS tablet Take 1 tablet by mouth at bedtime.   Yes [provider]  nitroGLYCERIN (NITROSTAT) 0.4 MG SL tablet Place 1 tablet (  0.4 mg total) under the tongue every 5 (five) minutes as needed for chest pain. 09/03/16  Yes Juline Patch, MD  nortriptyline (PAMELOR) 10 MG capsule Take 20 mg by mouth at bedtime.  09/20/17  Yes [provider]  pantoprazole (PROTONIX) 40 MG tablet Take 1 tablet (40 mg total) daily by mouth. 09/28/17  Yes Juline Patch, MD  EPINEPHrine (EPIPEN 2-PAK) 0.3 mg/0.3 mL IJ SOAJ injection Inject 0.3 mLs (0.3 mg total) into the muscle once. Patient not taking: Reported on 03/15/2018 06/05/15   Harvest Dark, MD  sertraline  (ZOLOFT) 50 MG tablet Take 0.5 tablets (25 mg total) daily by mouth. 09/28/17   Juline Patch, MD     Family History  Problem Relation Age of Onset  . Arrhythmia Mother   . Coronary artery disease Father   . Diabetes Father   . Hyperlipidemia Brother     Social History   Socioeconomic History  . Marital status: Married    Spouse name: Not on file  . Number of children: Not on file  . Years of education: Not on file  . Highest education level: Not on file  Occupational History  . Not on file  Social Needs  . Financial resource strain: Not on file  . Food insecurity:    Worry: Not on file    Inability: Not on file  . Transportation needs:    Medical: Not on file    Non-medical: Not on file  Tobacco Use  . Smoking status: Never Smoker  . Smokeless tobacco: Never Used  Substance and Sexual Activity  . Alcohol use: No    Alcohol/week: 0.0 oz  . Drug use: No  . Sexual activity: Never  Lifestyle  . Physical activity:    Days per week: Not on file    Minutes per session: Not on file  . Stress: Not on file  Relationships  . Social connections:    Talks on phone: Not on file    Gets together: Not on file    Attends religious service: Not on file    Active member of club or organization: Not on file    Attends meetings of clubs or organizations: Not on file    Relationship status: Not on file  Other Topics Concern  . Not on file  Social History Narrative  . Not on file      Review of Systems: A 12 point ROS discussed and pertinent positives are indicated in the HPI above.  All other systems are negative.  Review of Systems  Vital Signs: BP (!) 166/88   Pulse 82   Temp 98.7 F (37.1 C) (Tympanic)   Resp 20   Ht 5\' 7"  (1.702 m)   Wt 195 lb (88.5 kg)   SpO2 98%   BMI 30.54 kg/m   Physical Exam  Constitutional: He is oriented to person, place, and time. He appears well-developed and well-nourished.  Eyes: Conjunctivae are normal. No scleral icterus.    Cardiovascular: Normal rate and regular rhythm.  Pulmonary/Chest: Effort normal.  Abdominal: Soft. Bowel sounds are normal.  Musculoskeletal: He exhibits no edema.  Neurological: He is alert and oriented to person, place, and time.  Skin: He is not diaphoretic.  Psychiatric: He has a normal mood and affect. His behavior is normal.    Imaging: Abdomen 1 View (kub)  Result Date: 02/21/2018 CLINICAL DATA:  Kidney stones EXAM: ABDOMEN - 1 VIEW COMPARISON:  05/09/2015 lumbar radiography FINDINGS: Two large lamellated stones  over the central right kidney, the larger measuring 17 mm. Possible small faint stones over the right lower pole. No visible ureteral calculus. Left pelvic calcification is stable from prior and likely vascular. Normal bowel gas pattern. IMPRESSION: At least two right renal calculi that have enlarged from 2016, the largest measuring 17 mm. Electronically Signed   By: Monte Fantasia M.D.   On: 02/21/2018 15:13   Ct Renal Stone Study  Result Date: 03/11/2018 CLINICAL DATA:  Right flank pain for 2-3 months. EXAM: CT ABDOMEN AND PELVIS WITHOUT CONTRAST TECHNIQUE: Multidetector CT imaging of the abdomen and pelvis was performed following the standard protocol without IV contrast. COMPARISON:  No previous CT scan. FINDINGS: Lower chest: Unremarkable. Hepatobiliary: No focal abnormality in the liver on this study without intravenous contrast. Tiny gallstones evident. No intrahepatic or extrahepatic biliary dilation. Pancreas: No focal mass lesion. No dilatation of the main duct. No intraparenchymal cyst. No peripancreatic edema. Spleen: No splenomegaly. No focal mass lesion. Adrenals/Urinary Tract: No adrenal nodule or mass. 19 mm lesion upper pole right kidney approaches water attenuation and is compatible with a cyst. 11 x 11 x 15 mm stone is identified in the right renal pelvis. A second stone in the right kidney measuring 12 x 7 x 10 mm appears to fill a lower pole collecting system.  Several other punctate 1 mm stones are seen in the right kidney. No right ureteral stone. No right hydroureter. 3.7 cm well-defined homogeneous low-density lesion in the lower pole the right kidney cannot be definitively characterized but is likely a cyst. 1-2 mm nonobstructing stone identified in the lower pole of the left kidney. No left ureteral stone or left hydroureter. Layering tiny stones/calcific debris identified in the posterior bladder. Stomach/Bowel: Stomach is nondistended. No gastric wall thickening. No evidence of outlet obstruction. Duodenum is normally positioned as is the ligament of Treitz. No small bowel wall thickening. No small bowel dilatation. The terminal ileum is normal. The appendix is normal. Diverticular changes are noted in the left colon without evidence of diverticulitis. Vascular/Lymphatic: There is abdominal aortic atherosclerosis without aneurysm. There is no gastrohepatic or hepatoduodenal ligament lymphadenopathy. No intraperitoneal or retroperitoneal lymphadenopathy. No pelvic sidewall lymphadenopathy. Reproductive: Prostate gland appears mildly enlarged with dystrophic calcification in the parenchyma. Other: No intraperitoneal free fluid. Musculoskeletal: Small bilateral groin hernias contain only fat. Bone windows reveal no worrisome lytic or sclerotic osseous lesions. IMPRESSION: 1. 2 large stones identified in the right kidney without hydronephrosis. Several (2-3) additional tiny 1-2 mm stones are seen in the right kidney. 2. 1-2 mm nonobstructing stone identified in the lower pole the left kidney. 3. No ureteral stones but there does appear to be several tiny stones layering dependent in the bladder lumen. 4. Today's CT scan suggest the presence of subtle non mineralized gallstones although ultrasound exam 1 year ago did not demonstrate cholelithiasis. 5. Low-density lesions in each kidney cannot be definitively characterized, but are likely cysts. Electronically Signed    By: Misty Stanley M.D.   On: 03/11/2018 16:50    Labs:  CBC: Recent Labs    03/08/18 1151  WBC 6.6  HGB 14.8  HCT 43.8  PLT 262    COAGS: No results for input(s): INR, APTT in the last 8760 hours.  BMP: Recent Labs    09/28/17 1646 03/08/18 1151  NA 142 137  K 4.3 3.5  CL 103 103  CO2 26 27  GLUCOSE 125* 111*  BUN 15 16  CALCIUM 9.0 8.9  CREATININE  0.95 1.05  GFRNONAA 80 >60  GFRAA 92 >60    LIVER FUNCTION TESTS: Recent Labs    04/08/17 1131 09/28/17 1646  BILITOT 1.0  --   AST 25  --   ALT 23  --   ALKPHOS 59  --   ALBUMIN 4.0 4.0     Assessment and Plan:  Right renal calculi, plan for pcn access prior to operative PNL today.  Consent obtained  Risks and benefits of right pcn were discussed with the patient including, but not limited to, infection, bleeding, significant bleeding causing loss or decrease in renal function or damage to adjacent structures.   All of the patient's questions were answered, patient is agreeable to proceed.  Consent signed and in chart.   Thank you for this interesting consult.  I greatly enjoyed meeting DAMARIA STOFKO and look forward to participating in their care.  A copy of this report was sent to the requesting provider on this date.  Electronically Signed: Greggory Keen, MD 03/15/2018, 8:04 AM   I spent a total of  30 Minutes   in face to face in clinical consultation, greater than 50% of which was counseling/coordinating care for for this patient with right renal calculi

## 2018-03-15 NOTE — Procedures (Signed)
Right renal calculi  S/p rt pcn access prior to operative PNL  No comp Stable EBL 0 Full report in pacs

## 2018-03-15 NOTE — Transfer of Care (Signed)
Immediate Anesthesia Transfer of Care Note  Patient: Timothy Hatfield  Procedure(s) Performed: NEPHROLITHOTOMY PERCUTANEOUS (Right Back)  Patient Location: PACU  Anesthesia Type:General  Level of Consciousness: awake and responds to stimulation  Airway & Oxygen Therapy: Patient Spontanous Breathing and Patient connected to face mask oxygen  Post-op Assessment: Report given to RN and Post -op Vital signs reviewed and stable  Post vital signs: Reviewed and stable  Last Vitals:  Vitals Value Taken Time  BP 146/99 03/15/2018  3:20 PM  Temp    Pulse 86 03/15/2018  3:20 PM  Resp 11 03/15/2018  3:20 PM  SpO2 100 % 03/15/2018  3:20 PM    Last Pain:  Vitals:   03/15/18 0638  TempSrc: Tympanic  PainSc: 0-No pain      Patients Stated Pain Goal: 0 (50/53/97 [id number])  Complications: No apparent anesthesia complications

## 2018-03-15 NOTE — Anesthesia Preprocedure Evaluation (Signed)
Anesthesia Evaluation  Patient identified by MRN, date of birth, ID band Patient awake    Reviewed: Allergy & Precautions, NPO status , Patient's Chart, lab work & pertinent test results, reviewed documented beta blocker date and time   Airway Mallampati: III  TM Distance: >3 FB     Dental  (+) Chipped   Pulmonary sleep apnea , pneumonia, resolved,           Cardiovascular hypertension, Pt. on medications and Pt. on home beta blockers + CAD       Neuro/Psych    GI/Hepatic GERD  Controlled,  Endo/Other    Renal/GU Renal disease     Musculoskeletal  (+) Arthritis ,   Abdominal   Peds  Hematology   Anesthesia Other Findings   Reproductive/Obstetrics                             Anesthesia Physical Anesthesia Plan  ASA: III  Anesthesia Plan: General   Post-op Pain Management:    Induction: Intravenous  PONV Risk Score and Plan:   Airway Management Planned: Oral ETT  Additional Equipment:   Intra-op Plan:   Post-operative Plan:   Informed Consent: I have reviewed the patients History and Physical, chart, labs and discussed the procedure including the risks, benefits and alternatives for the proposed anesthesia with the patient or authorized representative who has indicated his/her understanding and acceptance.     Plan Discussed with: CRNA  Anesthesia Plan Comments:         Anesthesia Quick Evaluation

## 2018-03-15 NOTE — Anesthesia Procedure Notes (Signed)
Procedure Name: Intubation Performed by: Lance Muss, CRNA Pre-anesthesia Checklist: Patient identified, Patient being monitored, Timeout performed, Emergency Drugs available and Suction available Patient Re-evaluated:Patient Re-evaluated prior to induction Oxygen Delivery Method: Circle system utilized Preoxygenation: Pre-oxygenation with 100% oxygen Induction Type: IV induction Ventilation: Mask ventilation without difficulty Laryngoscope Size: McGraph and 4 Grade View: Grade II Tube type: Oral Tube size: 7.5 mm Number of attempts: 1 Airway Equipment and Method: Stylet,  LTA kit utilized and Video-laryngoscopy Placement Confirmation: ETT inserted through vocal cords under direct vision,  positive ETCO2 and breath sounds checked- equal and bilateral Secured at: 22 cm Tube secured with: Tape Dental Injury: Teeth and Oropharynx as per pre-operative assessment  Difficulty Due To: Difficult Airway- due to anterior larynx Future Recommendations: Recommend- induction with short-acting agent, and alternative techniques readily available

## 2018-03-15 NOTE — Interval H&P Note (Signed)
History and Physical Interval Note:  03/15/2018 1:05 PM  Timothy Hatfield  has presented today for surgery, with the diagnosis of right nephrolithiasis  The various methods of treatment have been discussed with the patient and family. After consideration of risks, benefits and other options for treatment, the patient has consented to  Procedure(s): NEPHROLITHOTOMY PERCUTANEOUS (Right) as a surgical intervention .  The patient's history has been reviewed, patient examined, no change in status, stable for surgery.  I have reviewed the patient's chart and labs.  Questions were answered to the patient's satisfaction.     [place]

## 2018-03-16 ENCOUNTER — Encounter: Payer: Self-pay | Admitting: Urology

## 2018-03-16 ENCOUNTER — Telehealth: Payer: Self-pay | Admitting: Urology

## 2018-03-16 DIAGNOSIS — N200 Calculus of kidney: Secondary | ICD-10-CM | POA: Diagnosis not present

## 2018-03-16 DIAGNOSIS — Z87442 Personal history of urinary calculi: Secondary | ICD-10-CM

## 2018-03-16 LAB — HEMOGLOBIN AND HEMATOCRIT, BLOOD
HCT: 38.2 % — ABNORMAL LOW (ref 40.0–52.0)
Hemoglobin: 13 g/dL (ref 13.0–18.0)

## 2018-03-16 MED ORDER — HYDROCODONE-ACETAMINOPHEN 5-325 MG PO TABS: 1.0000 | ORAL_TABLET | ORAL | 0 refills | Status: DC | PRN

## 2018-03-16 MED ORDER — SULFAMETHOXAZOLE-TRIMETHOPRIM 800-160 MG PO TABS: 1.0000 | ORAL_TABLET | Freq: Two times a day (BID) | ORAL | 0 refills | Status: AC

## 2018-03-16 NOTE — Discharge Summary (Signed)
Date of admission: 03/15/2018  Date of discharge: 03/16/2018  Admission diagnosis: Right nephrolithiasis  Discharge diagnosis: Right nephrolithiasis  Secondary diagnoses:  Patient Active Problem List   Diagnosis Date Noted  . Nephrolithiasis 03/15/2018  . Acute calcific periarthritis 06/18/2015  . Allergic rhinitis 06/18/2015  . Arthritis 06/18/2015  . 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 Essential (primary) hypertension 06/18/2015  . 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 HLD (hyperlipidemia) 06/18/2015  . External hemorrhoid 06/18/2015  . Gastroenteritis 06/18/2015  . Calculus of kidney 05/15/2014    History and Physical: For full details, please see admission history and physical. Briefly, Timothy Hatfield is a 72 y.o. year old patient with with 2 large right renal pelvic calculi admitted for PCNL.  Hospital Course: Patient tolerated the procedure well.  He was then transferred to the floor after an uneventful PACU stay.  His hospital course was uncomplicated.  On POD#1 his nephrostomy tube was plugged and he had no flank pain.  The nephrostomy tube was then removed.  He had met discharge criteria: was eating a regular diet, was up and ambulating independently,  pain was well controlled, was voiding without a catheter, and was ready to for discharge.   Laboratory values:  Recent Labs    03/15/18 0752 03/16/18 0419  WBC 8.4  --   HGB 15.0 13.0  HCT 44.2 38.2*   Recent Labs    03/15/18 0752  NA 137  K 3.5  CL 104  CO2 27  GLUCOSE 133*  BUN 14  CREATININE 1.13  CALCIUM 8.9   Recent Labs    03/15/18 0752  INR 1.01   No results for input(s): LABURIN in the last 72 hours. Results for orders placed or performed during the hospital encounter of 03/08/18  Urine culture     Status: None   Collection Time: 03/08/18 11:51 AM  Result Value Ref Range Status   Specimen Description   Final    URINE, CLEAN CATCH Performed at [facility], [address],  [place], [place] [address]    Special Requests   Final    NONE Performed at [facility], [address] [place], [place] [address]    Culture   Final    NO GROWTH Performed at [facility], [place] [address] [place], [place] [address]    Report Status 03/09/2018 FINAL  Final    Disposition: Home  Discharge instruction: Refer to detailed instruction  Discharge medications:  Per detailed discharge instruction  Followup:  2 weeks for cystoscopy and stent removal.  Office will contact with appointment

## 2018-03-16 NOTE — Progress Notes (Signed)
POD 1  No complaints this morning.  Mild discomfort at tube site.  Neph tube clamped at 0600.  No flank pain VSS afebrile Hematocrit 38%  Impression: Doing well status post PCNL Plan: Will recheck at lunchtime.  If no flank pain will remove nephrostomy tube and DC home.

## 2018-03-16 NOTE — Telephone Encounter (Signed)
-----   Message from Abbie Sons, MD sent at 03/16/2018 12:51 PM EDT ----- Please schedule KUB and cystoscopy with stent removal in approximately 2-3 weeks.

## 2018-03-16 NOTE — Progress Notes (Signed)
Discharge order received. Patient is alert and oriented. Vital signs stable . No signs of acute distress. Discharge instructions given. Patient verbalized understanding. No other issues noted at this time.   

## 2018-03-16 NOTE — Telephone Encounter (Signed)
App made can you put the order in for the KUB please sir?   Thanks, Sharyn Lull

## 2018-03-17 NOTE — Telephone Encounter (Signed)
Order was entered 

## 2018-03-19 NOTE — Anesthesia Postprocedure Evaluation (Signed)
Anesthesia Post Note  Patient: Timothy Hatfield  Procedure(s) Performed: NEPHROLITHOTOMY PERCUTANEOUS (Right Back)  Patient location during evaluation: PACU Anesthesia Type: General Level of consciousness: awake and alert Pain management: pain level controlled Vital Signs Assessment: post-procedure vital signs reviewed and stable Respiratory status: spontaneous breathing, nonlabored ventilation, respiratory function stable and patient connected to nasal cannula oxygen Cardiovascular status: blood pressure returned to baseline and stable Postop Assessment: no apparent nausea or vomiting Anesthetic complications: no     Last Vitals:  Vitals:   03/15/18 1619 03/15/18 1700  BP: (!) 158/89   Pulse: 84   Resp: 13   Temp: 36.4 C 36.8 C  SpO2: 100%     Last Pain:  Vitals:   03/16/18 1111  TempSrc:   PainSc: 4                  Martha Clan

## 2018-03-21 LAB — STONE ANALYSIS
Ca Oxalate,Dihydrate: 20 %
Ca Oxalate,Monohydr.: 30 %
Ca phos cry stone ql IR: 5 %
STONE WEIGHT KSTONE: 579 mg
Uric Acid: 45 %

## 2018-03-25 ENCOUNTER — Encounter: Payer: Self-pay | Admitting: Family Medicine

## 2018-03-25 ENCOUNTER — Telehealth: Payer: Self-pay

## 2018-03-25 ENCOUNTER — Ambulatory Visit: Payer: Medicare Other | Admitting: Family Medicine

## 2018-03-25 ENCOUNTER — Other Ambulatory Visit: Payer: Self-pay | Admitting: Family Medicine

## 2018-03-25 ENCOUNTER — Other Ambulatory Visit: Payer: Self-pay

## 2018-03-25 VITALS — BP 124/70 | HR 68 | Ht 67.0 in | Wt 192.0 lb

## 2018-03-25 DIAGNOSIS — F331 Major depressive disorder, recurrent, moderate: Secondary | ICD-10-CM

## 2018-03-25 DIAGNOSIS — I10 Essential (primary) hypertension: Secondary | ICD-10-CM

## 2018-03-25 DIAGNOSIS — K219 Gastro-esophageal reflux disease without esophagitis: Secondary | ICD-10-CM | POA: Insufficient documentation

## 2018-03-25 DIAGNOSIS — N200 Calculus of kidney: Secondary | ICD-10-CM

## 2018-03-25 DIAGNOSIS — E782 Mixed hyperlipidemia: Secondary | ICD-10-CM | POA: Diagnosis not present

## 2018-03-25 DIAGNOSIS — K2270 Barrett's esophagus without dysplasia: Secondary | ICD-10-CM

## 2018-03-25 LAB — POCT URINALYSIS DIPSTICK

## 2018-03-25 MED ORDER — SERTRALINE HCL 50 MG PO TABS: 25.0000 mg | ORAL_TABLET | Freq: Every day | ORAL | 1 refills | Status: DC

## 2018-03-25 MED ORDER — PANTOPRAZOLE SODIUM 40 MG PO TBEC: 40.0000 mg | DELAYED_RELEASE_TABLET | Freq: Every day | ORAL | 1 refills | Status: DC

## 2018-03-25 MED ORDER — BENAZEPRIL-HYDROCHLOROTHIAZIDE 10-12.5 MG PO TABS: 1.0000 | ORAL_TABLET | Freq: Every day | ORAL | 1 refills | Status: DC

## 2018-03-25 MED ORDER — ATORVASTATIN CALCIUM 80 MG PO TABS: 80.0000 mg | ORAL_TABLET | Freq: Every day | ORAL | 1 refills | Status: DC

## 2018-03-25 MED ORDER — OXYBUTYNIN CHLORIDE 5 MG PO TABS: 5.0000 mg | ORAL_TABLET | Freq: Three times a day (TID) | ORAL | 0 refills | Status: DC

## 2018-03-25 MED ORDER — METOPROLOL SUCCINATE ER 25 MG PO TB24: 25.0000 mg | ORAL_TABLET | Freq: Every day | ORAL | 1 refills | Status: DC

## 2018-03-25 MED ORDER — HYDROCODONE-ACETAMINOPHEN 5-325 MG PO TABS: 1.0000 | ORAL_TABLET | ORAL | 0 refills | Status: DC | PRN

## 2018-03-25 NOTE — Telephone Encounter (Signed)
Pt stopped by office c/o pain and burning with urination. I informed pt this is most likely due to his stent placement from his surgery. I consulted with Dr. Erlene Quan on pt, she recommended pt take Oxybutynin 5mg , tid, until 04/05/18 when pt has stent removed. Pt also expressed concerns about mucus in his stool, as well as an upset stomach. I encouraged pt to stay hydrated and if his GI sx's persist to contact his pcp for eval.

## 2018-03-25 NOTE — Progress Notes (Signed)
Name: Timothy Hatfield   MRN: [id number]    DOB: 10/11/1946   Date:03/25/2018       Progress Note  Subjective  Chief Complaint  Chief Complaint  Patient presents with  . Hypertension  . Gastroesophageal Reflux  . Depression  . Hyperlipidemia  . Allergic Rhinitis     Hypertension  This is a chronic problem. The current episode started more than 1 year ago. The problem is unchanged. The problem is controlled. Pertinent negatives include no anxiety, blurred vision, chest pain, headaches, malaise/fatigue, neck pain, orthopnea, palpitations, peripheral edema, PND, shortness of breath or sweats. There are no associated agents to hypertension. There are no known risk factors for coronary artery disease. Past treatments include ACE inhibitors, diuretics and beta blockers. The current treatment provides moderate improvement. There are no compliance problems.  There is no history of angina, kidney disease, CAD/MI, CVA, heart failure, left ventricular hypertrophy, PVD or retinopathy. There is no history of chronic renal disease, a hypertension causing med or renovascular disease.  Gastroesophageal Reflux  He reports no abdominal pain, no belching, no chest pain, no choking, no coughing, no dysphagia, no early satiety, no globus sensation, no heartburn, no hoarse voice, no nausea, no sore throat, no stridor, no tooth decay, no water brash or no wheezing. This is a chronic problem. The current episode started more than 1 year ago. The problem occurs occasionally. The problem has been waxing and waning. The symptoms are aggravated by certain foods. Pertinent negatives include no anemia, fatigue, melena, muscle weakness, orthopnea or weight loss. He has tried a PPI for the symptoms. The treatment provided moderate relief.  Depression       The patient presents with depression.  This is a chronic problem.  The current episode started more than 1 year ago.   The onset quality is gradual.   The problem occurs  intermittently.  The problem has been waxing and waning since onset.  Associated symptoms include no decreased concentration, no fatigue, no helplessness, no hopelessness, does not have insomnia, not irritable, no restlessness, no decreased interest, no appetite change, no body aches, no myalgias, no headaches, no indigestion, not sad and no suicidal ideas.  Past treatments include SSRIs - Selective serotonin reuptake inhibitors.  Compliance with treatment is good.  Previous treatment provided mild relief.  Past medical history includes depression.     Pertinent negatives include no chronic fatigue syndrome and no anxiety. Hyperlipidemia  This is a chronic problem. The current episode started more than 1 year ago. The problem is controlled. Recent lipid tests were reviewed and are normal. He has no history of chronic renal disease. Factors aggravating his hyperlipidemia include beta blockers. Pertinent negatives include no chest pain, focal sensory loss, focal weakness, leg pain, myalgias or shortness of breath. Current antihyperlipidemic treatment includes statins. The current treatment provides moderate improvement of lipids. There are no compliance problems.  Risk factors for coronary artery disease include hypertension and dyslipidemia.  Hematuria  This is a new problem. The current episode started in the past 7 days. The problem has been gradually improving since onset. He describes the hematuria as gross hematuria. His pain is at a severity of 5/10. The pain is moderate. Irritative symptoms do not include frequency, nocturia or urgency. Obstructive symptoms do not include dribbling, incomplete emptying, an intermittent stream, a slower stream, straining or a weak stream. Pertinent negatives include no abdominal pain, chills, dysuria, fever or nausea. His past medical history is significant for hypertension.  No problem-specific Assessment & Plan notes found for this encounter.   Past Medical  History:  Diagnosis Date  . Allergy   . BPH (benign prostatic hyperplasia)   . Coronary artery disease   . GERD (gastroesophageal reflux disease)   . History of kidney stones   . Hyperlipidemia   . Hypertension   . Pneumonia   . Renal disorder    kidney stones  . Sleep apnea    CPAP    Past Surgical History:  Procedure Laterality Date  . CARDIAC CATHETERIZATION Left 09/09/2016   Procedure: Left Heart Cath and Coronary Angiography;  Surgeon: Yolonda Kida, MD;  Location: [facility];  Service: Cardiovascular;  Laterality: Left;  . COLONOSCOPY  2013   normal/ Dr Vira Agar- cleared for 10 yrs  . COLONOSCOPY WITH PROPOFOL N/A 01/03/2018   Procedure: COLONOSCOPY WITH PROPOFOL;  Surgeon: Manya Silvas, MD;  Location: [facility] ENDOSCOPY;  Service: Endoscopy;  Laterality: N/A;  . CORONARY ARTERY BYPASS GRAFT    . ESOPHAGOGASTRODUODENOSCOPY (EGD) WITH PROPOFOL N/A 01/03/2018   Procedure: ESOPHAGOGASTRODUODENOSCOPY (EGD) WITH PROPOFOL;  Surgeon: Manya Silvas, MD;  Location: [facility] ENDOSCOPY;  Service: Endoscopy;  Laterality: N/A;  . EYE SURGERY    . HERNIA REPAIR    . IR NEPHROSTOMY PLACEMENT RIGHT  03/15/2018  . kidney stones     lithotripsy  . LITHOTRIPSY    . NEPHROLITHOTOMY Right 03/15/2018   Procedure: NEPHROLITHOTOMY PERCUTANEOUS;  Surgeon: Abbie Sons, MD;  Location: [facility] ORS;  Service: Urology;  Laterality: Right;  . UPPER GI ENDOSCOPY  2013    Family History  Problem Relation Age of Onset  . Arrhythmia Mother   . Coronary artery disease Father   . Diabetes Father   . Hyperlipidemia Brother     Social History   Socioeconomic History  . Marital status: Married    Spouse name: Not on file  . Number of children: Not on file  . Years of education: Not on file  . Highest education level: Not on file  Occupational History  . Not on file  Social Needs  . Financial resource strain: Not on file  . Food insecurity:    Worry: Not on file    Inability: Not  on file  . Transportation needs:    Medical: Not on file    Non-medical: Not on file  Tobacco Use  . Smoking status: Never Smoker  . Smokeless tobacco: Never Used  Substance and Sexual Activity  . Alcohol use: No    Alcohol/week: 0.0 oz  . Drug use: No  . Sexual activity: Never  Lifestyle  . Physical activity:    Days per week: Not on file    Minutes per session: Not on file  . Stress: Not on file  Relationships  . Social connections:    Talks on phone: Not on file    Gets together: Not on file    Attends religious service: Not on file    Active member of club or organization: Not on file    Attends meetings of clubs or organizations: Not on file    Relationship status: Not on file  . Intimate partner violence:    Fear of current or ex partner: Not on file    Emotionally abused: Not on file    Physically abused: Not on file    Forced sexual activity: Not on file  Other Topics Concern  . Not on file  Social History Narrative  . Not on  file    Allergies  Allergen Reactions  . Bee Venom Anaphylaxis  . Ciprofloxacin Itching    Outpatient Medications Prior to Visit  Medication Sig Dispense Refill  . acetaminophen (TYLENOL) 500 MG tablet Take 1,000 mg by mouth daily as needed for moderate pain.    Marland Kitchen aspirin EC 81 MG tablet Take 81 mg by mouth daily.    . Coenzyme Q10 (COQ-10) 100 MG CAPS Take 100 mg by mouth at bedtime.    . Dutasteride-Tamsulosin HCl (JALYN) 0.5-0.4 MG CAPS Take 1 capsule by mouth daily. Urology- [facility] (Patient taking differently: Take 1 capsule by mouth every Monday, Wednesday, and Friday. Urology- [facility]) 30 capsule 11  . Misc Natural Products (GLUCOSAMINE CHOND DOUBLE STR PO) Take 2 tablets by mouth at bedtime.    . mometasone (NASONEX) 50 MCG/ACT nasal spray Place 2 sprays daily into the nose. (Patient taking differently: Place 1 spray into the nose daily. ) 17 g 1  . Multiple Vitamin (MULTIVITAMIN WITH MINERALS) TABS tablet Take 1 tablet by mouth  at bedtime.    . nitroGLYCERIN (NITROSTAT) 0.4 MG SL tablet Place 1 tablet (0.4 mg total) under the tongue every 5 (five) minutes as needed for chest pain. 50 tablet 3  . nortriptyline (PAMELOR) 10 MG capsule Take 20 mg by mouth at bedtime.   1  . atorvastatin (LIPITOR) 80 MG tablet Take 80 mg by mouth daily.  5  . benazepril-hydrochlorthiazide (LOTENSIN HCT) 10-12.5 MG tablet TAKE 1 TABLET BY MOUTH EVERY DAY 90 tablet 0  . metoprolol succinate (TOPROL-XL) 25 MG 24 hr tablet Take 1 tablet (25 mg total) daily by mouth. 90 tablet 1  . pantoprazole (PROTONIX) 40 MG tablet Take 1 tablet (40 mg total) daily by mouth. 90 tablet 1  . sertraline (ZOLOFT) 50 MG tablet Take 0.5 tablets (25 mg total) daily by mouth. 45 tablet 1  . HYDROcodone-acetaminophen (NORCO/VICODIN) 5-325 MG tablet Take 1-2 tablets by mouth every 4 (four) hours as needed for moderate pain. (Patient not taking: Reported on 03/25/2018) 20 tablet 0   No facility-administered medications prior to visit.     Review of Systems  Constitutional: Negative for appetite change, chills, fatigue, fever, malaise/fatigue and weight loss.  HENT: Negative for ear discharge, ear pain, hoarse voice and sore throat.   Eyes: Negative for blurred vision.  Respiratory: Negative for cough, sputum production, choking, shortness of breath and wheezing.   Cardiovascular: Negative for chest pain, palpitations, orthopnea, leg swelling and PND.  Gastrointestinal: Negative for abdominal pain, blood in stool, constipation, diarrhea, dysphagia, heartburn, melena and nausea.  Genitourinary: Positive for hematuria. Negative for dysuria, frequency, incomplete emptying, nocturia and urgency.  Musculoskeletal: Negative for back pain, joint pain, myalgias, muscle weakness and neck pain.  Skin: Negative for rash.  Neurological: Negative for dizziness, tingling, sensory change, focal weakness and headaches.  Endo/Heme/Allergies: Negative for environmental allergies and  polydipsia. Does not bruise/bleed easily.  Psychiatric/Behavioral: Positive for depression. Negative for decreased concentration and suicidal ideas. The patient is not nervous/anxious and does not have insomnia.      Objective  Vitals:   03/25/18 0936  BP: 124/70  Pulse: 68  Weight: 192 lb (87.1 kg)  Height: 5\' 7"  (1.702 m)    Physical Exam  Constitutional: He is oriented to person, place, and time. He is not irritable.  HENT:  Head: Normocephalic.  Right Ear: External ear normal.  Left Ear: External ear normal.  Nose: Nose normal.  Mouth/Throat: Oropharynx is clear and moist.  Eyes: Pupils are equal, round, and reactive to light. Conjunctivae and EOM are normal. Right eye exhibits no discharge. Left eye exhibits no discharge. No scleral icterus.  Neck: Normal range of motion. Neck supple. No JVD present. No tracheal deviation present. No thyromegaly present.  Cardiovascular: Normal rate, regular rhythm, normal heart sounds and intact distal pulses. Exam reveals no gallop and no friction rub.  No murmur heard. Pulmonary/Chest: Breath sounds normal. No respiratory distress. He has no wheezes. He has no rales.  Abdominal: Soft. Bowel sounds are normal. He exhibits no mass. There is no hepatosplenomegaly. There is no tenderness. There is no rebound, no guarding and no CVA tenderness.  Musculoskeletal: Normal range of motion. He exhibits no edema or tenderness.  Lymphadenopathy:    He has no cervical adenopathy.  Neurological: He is alert and oriented to person, place, and time. He has normal strength and normal reflexes. No cranial nerve deficit.  Skin: Skin is warm. No rash noted.  Nursing note and vitals reviewed.     Assessment & Plan  Problem List Items Addressed This Visit      Cardiovascular and Mediastinum   Essential (primary) hypertension - Primary   Relevant Medications   benazepril-hydrochlorthiazide (LOTENSIN HCT) 10-12.5 MG tablet   metoprolol succinate  (TOPROL-XL) 25 MG 24 hr tablet   atorvastatin (LIPITOR) 80 MG tablet     Digestive   Barrett esophagus   Relevant Medications   pantoprazole (PROTONIX) 40 MG tablet   GERD without esophagitis   Relevant Medications   pantoprazole (PROTONIX) 40 MG tablet     Genitourinary   Kidney stones   Relevant Medications   HYDROcodone-acetaminophen (NORCO/VICODIN) 5-325 MG tablet   Other Relevant Orders   POCT Urinalysis Dipstick (Completed)     Other   HLD (hyperlipidemia)   Relevant Medications   benazepril-hydrochlorthiazide (LOTENSIN HCT) 10-12.5 MG tablet   metoprolol succinate (TOPROL-XL) 25 MG 24 hr tablet   atorvastatin (LIPITOR) 80 MG tablet   Moderate episode of recurrent major depressive disorder (HCC)   Relevant Medications   sertraline (ZOLOFT) 50 MG tablet      Meds ordered this encounter  Medications  . pantoprazole (PROTONIX) 40 MG tablet    Sig: Take 1 tablet (40 mg total) by mouth daily.    Dispense:  90 tablet    Refill:  1  . benazepril-hydrochlorthiazide (LOTENSIN HCT) 10-12.5 MG tablet    Sig: Take 1 tablet by mouth daily.    Dispense:  90 tablet    Refill:  1  . metoprolol succinate (TOPROL-XL) 25 MG 24 hr tablet    Sig: Take 1 tablet (25 mg total) by mouth daily.    Dispense:  90 tablet    Refill:  1  . sertraline (ZOLOFT) 50 MG tablet    Sig: Take 0.5 tablets (25 mg total) by mouth daily.    Dispense:  45 tablet    Refill:  1  . HYDROcodone-acetaminophen (NORCO/VICODIN) 5-325 MG tablet    Sig: Take 1-2 tablets by mouth every 4 (four) hours as needed for moderate pain.    Dispense:  20 tablet    Refill:  0  . atorvastatin (LIPITOR) 80 MG tablet    Sig: Take 1 tablet (80 mg total) by mouth daily.    Dispense:  90 tablet    Refill:  1      Dr. Otilio Miu [facility]  03/25/18

## 2018-04-04 ENCOUNTER — Ambulatory Visit
Admission: RE | Admit: 2018-04-04 | Discharge: 2018-04-04 | Disposition: A | Discharge status: Home / Self Care | Payer: Medicare Other | Source: Ambulatory Visit | Attending: Urology | Admitting: Urology

## 2018-04-04 DIAGNOSIS — Z87442 Personal history of urinary calculi: Secondary | ICD-10-CM

## 2018-04-05 ENCOUNTER — Ambulatory Visit (INDEPENDENT_AMBULATORY_CARE_PROVIDER_SITE_OTHER): Payer: Medicare Other | Admitting: Urology

## 2018-04-05 ENCOUNTER — Encounter: Payer: Self-pay | Admitting: Urology

## 2018-04-05 VITALS — BP 191/93 | HR 108 | Ht 67.0 in | Wt 193.8 lb

## 2018-04-05 DIAGNOSIS — N200 Calculus of kidney: Secondary | ICD-10-CM | POA: Diagnosis not present

## 2018-04-05 LAB — URINALYSIS, COMPLETE
Bilirubin, UA: NEGATIVE
GLUCOSE, UA: NEGATIVE
KETONES UA: NEGATIVE
Nitrite, UA: NEGATIVE
Specific Gravity, UA: 1.01 (ref 1.005–1.030)
Urobilinogen, Ur: 0.2 mg/dL (ref 0.2–1.0)
pH, UA: 7 (ref 5.0–7.5)

## 2018-04-05 LAB — MICROSCOPIC EXAMINATION
Bacteria, UA: NONE SEEN
Epithelial Cells (non renal): NONE SEEN /hpf (ref 0–10)
RBC, UA: >30 /hpf — ABNORMAL HIGH (ref 0–2)

## 2018-04-05 MED ORDER — SULFAMETHOXAZOLE-TRIMETHOPRIM 400-80 MG PO TABS
1.0000 | ORAL_TABLET | Freq: Once | ORAL | Status: AC
  Administered 2018-04-05: 1 via ORAL

## 2018-04-05 MED ORDER — LIDOCAINE HCL URETHRAL/MUCOSAL 2 % EX GEL
1.0000 "application " | Freq: Once | CUTANEOUS | Status: AC
  Administered 2018-04-05: 1 via URETHRAL

## 2018-04-05 NOTE — Progress Notes (Signed)
Indications: Patient is 72 y.o., male who recently underwent right PCNL and has an indwelling JJ ureteral stent.  The patient is presenting today for stent removal.  KUB shows a possible less than 3 mm remaining fragment on  Procedure:  Flexible Cystoscopy with stent removal (19509)  Timeout was performed and the correct patient, procedure and participants were identified.    Description:  The patient was prepped and draped in the usual sterile fashion. Flexible cystosopy was performed.  The stent was visualized, grasped, and removed intact without difficulty. The patient tolerated the procedure well.  A single dose of oral antibiotics was given.  Complications:  None  Plan: Recommend proceeding with a metabolic evaluation to include blood work and a 24 urine study.  Follow-up 3 months with a KUB.

## 2018-04-07 ENCOUNTER — Encounter: Payer: Self-pay | Admitting: Urology

## 2018-04-21 ENCOUNTER — Other Ambulatory Visit: Payer: Medicare Other

## 2018-07-05 ENCOUNTER — Ambulatory Visit
Admission: RE | Admit: 2018-07-05 | Discharge: 2018-07-05 | Disposition: A | Discharge status: Home / Self Care | Payer: Medicare Other | Source: Ambulatory Visit | Attending: Urology | Admitting: Urology

## 2018-07-05 DIAGNOSIS — N200 Calculus of kidney: Secondary | ICD-10-CM | POA: Insufficient documentation

## 2018-07-06 ENCOUNTER — Ambulatory Visit: Payer: Medicare Other | Admitting: Urology

## 2018-07-06 ENCOUNTER — Encounter: Payer: Self-pay | Admitting: Urology

## 2018-07-06 VITALS — BP 150/69 | HR 92 | Ht 67.0 in | Wt 190.0 lb

## 2018-07-06 DIAGNOSIS — N200 Calculus of kidney: Secondary | ICD-10-CM | POA: Diagnosis not present

## 2018-07-06 NOTE — Progress Notes (Signed)
07/06/2018 3:57 PM   Timothy Hatfield Oct 24, 1946 [id number]  Referring provider: Juline Patch, MD [address] [place],  [id number]  Chief Complaint  Patient presents with  . Nephrolithiasis    3 month w/KUB    HPI: 72 year old male presents for follow-up of nephrolithiasis.  He underwent right PCNL on 03/15/2018.  Follow-up KUB showed a possible 3 mm fragment on the lateral aspect of the right renal outline.  He presents for 35-month follow-up and remains asymptomatic.  Denies flank/abdominal pain or gross hematuria.  KUB performed today was reviewed and there is a persistent 3 mm fragment overlying the lateral portion of the right renal outline.   PMH: Past Medical History:  Diagnosis Date  . Allergy   . BPH (benign prostatic hyperplasia)   . Coronary artery disease   . GERD (gastroesophageal reflux disease)   . History of kidney stones   . Hyperlipidemia   . Hypertension   . Pneumonia   . Renal disorder    kidney stones  . Sleep apnea    CPAP    Surgical History: Past Surgical History:  Procedure Laterality Date  . CARDIAC CATHETERIZATION Left 09/09/2016   Procedure: Left Heart Cath and Coronary Angiography;  Surgeon: Yolonda Kida, MD;  Location: [facility];  Service: Cardiovascular;  Laterality: Left;  . COLONOSCOPY  2013   normal/ Dr Vira Agar- cleared for 10 yrs  . COLONOSCOPY WITH PROPOFOL N/A 01/03/2018   Procedure: COLONOSCOPY WITH PROPOFOL;  Surgeon: Manya Silvas, MD;  Location: [facility] ENDOSCOPY;  Service: Endoscopy;  Laterality: N/A;  . CORONARY ARTERY BYPASS GRAFT    . ESOPHAGOGASTRODUODENOSCOPY (EGD) WITH PROPOFOL N/A 01/03/2018   Procedure: ESOPHAGOGASTRODUODENOSCOPY (EGD) WITH PROPOFOL;  Surgeon: Manya Silvas, MD;  Location: [facility] ENDOSCOPY;  Service: Endoscopy;  Laterality: N/A;  . EYE SURGERY    . HERNIA REPAIR    . IR NEPHROSTOMY PLACEMENT RIGHT  03/15/2018  . kidney stones     lithotripsy  . LITHOTRIPSY      . NEPHROLITHOTOMY Right 03/15/2018   Procedure: NEPHROLITHOTOMY PERCUTANEOUS;  Surgeon: Abbie Sons, MD;  Location: [facility] ORS;  Service: Urology;  Laterality: Right;  . UPPER GI ENDOSCOPY  2013    Home Medications:  Allergies as of 07/06/2018      Reactions   Bee Venom Anaphylaxis   Ciprofloxacin Itching      Medication List        Accurate as of 07/06/18  3:57 PM. Always use your most recent med list.          acetaminophen 500 MG tablet Commonly known as:  TYLENOL Take 1,000 mg by mouth daily as needed for moderate pain.   aspirin EC 81 MG tablet Take 81 mg by mouth daily.   atorvastatin 80 MG tablet Commonly known as:  LIPITOR Take 1 tablet (80 mg total) by mouth daily.   benazepril-hydrochlorthiazide 10-12.5 MG tablet Commonly known as:  LOTENSIN HCT Take 1 tablet by mouth daily.   CoQ-10 100 MG Caps Take 100 mg by mouth at bedtime.   Dutasteride-Tamsulosin HCl 0.5-0.4 MG Caps Take 1 capsule by mouth daily. Urology- [facility]   etodolac 400 MG tablet Commonly known as:  LODINE TAKE 1 TABLET (400 MG TOTAL) 2 (TWO) TIMES DAILY BY MOUTH.   GLUCOSAMINE CHOND DOUBLE STR PO Take 2 tablets by mouth at bedtime.   metoprolol succinate 25 MG 24 hr tablet Commonly known as:  TOPROL-XL Take 1 tablet (25 mg total)  by mouth daily.   mometasone 50 MCG/ACT nasal spray Commonly known as:  NASONEX Place 2 sprays daily into the nose.   multivitamin with minerals Tabs tablet Take 1 tablet by mouth at bedtime.   nitroGLYCERIN 0.4 MG SL tablet Commonly known as:  NITROSTAT Place 1 tablet (0.4 mg total) under the tongue every 5 (five) minutes as needed for chest pain.   nortriptyline 10 MG capsule Commonly known as:  PAMELOR Take 20 mg by mouth at bedtime.   pantoprazole 40 MG tablet Commonly known as:  PROTONIX Take 1 tablet (40 mg total) by mouth daily.   sertraline 50 MG tablet Commonly known as:  ZOLOFT Take 0.5 tablets (25 mg total) by mouth daily.        Allergies:  Allergies  Allergen Reactions  . Bee Venom Anaphylaxis  . Ciprofloxacin Itching    Family History: Family History  Problem Relation Age of Onset  . Arrhythmia Mother   . Coronary artery disease Father   . Diabetes Father   . Hyperlipidemia Brother     Social History:  reports that he has never smoked. He has never used smokeless tobacco. He reports that he does not drink alcohol or use drugs.  ROS: UROLOGY Frequent Urination?: No Hard to postpone urination?: No Burning/pain with urination?: No Get up at night to urinate?: No Leakage of urine?: No Urine stream starts and stops?: No Trouble starting stream?: No Do you have to strain to urinate?: No Blood in urine?: No Urinary tract infection?: No Sexually transmitted disease?: No Injury to kidneys or bladder?: No Painful intercourse?: No Weak stream?: No Erection problems?: No Penile pain?: No  Gastrointestinal Nausea?: No Vomiting?: No Indigestion/heartburn?: No Diarrhea?: No Constipation?: No  Constitutional Fever: No Night sweats?: No Weight loss?: No Fatigue?: No  Skin Skin rash/lesions?: No Itching?: No  Eyes Blurred vision?: No Double vision?: No  Ears/Nose/Throat Sore throat?: No Sinus problems?: No  Hematologic/Lymphatic Swollen glands?: No Easy bruising?: No  Cardiovascular Leg swelling?: No Chest pain?: No  Respiratory Cough?: No Shortness of breath?: No  Endocrine Excessive thirst?: No  Musculoskeletal Back pain?: No Joint pain?: No  Neurological Headaches?: No Dizziness?: No  Psychologic Depression?: No Anxiety?: No  Physical Exam: BP (!) 150/69   Pulse 92   Ht 5\' 7"  (1.702 m)   Wt 190 lb (86.2 kg)   BMI 29.76 kg/m   Constitutional:  Alert and oriented, No acute distress. HEENT: [place] AT, moist mucus membranes.  Trachea midline, no masses. Cardiovascular: No clubbing, cyanosis, or edema. Respiratory: Normal respiratory effort, no increased work  of breathing. GI: Abdomen is soft, nontender, nondistended, no abdominal masses GU: No CVA tenderness Lymph: No cervical or inguinal lymphadenopathy. Skin: No rashes, bruises or suspicious lesions. Neurologic: Grossly intact, no focal deficits, moving all 4 extremities. Psychiatric: Normal mood and affect.    Assessment & Plan:   Probable small remaining right renal calculus.  He is asymptomatic and will continue to monitor.  He states he did do his 24 urine study however I do not have the results.  We will contact Dustin for the report and let the patient know.   Recommend 72-month follow-up with a KUB.  Abbie Sons, [facility] [address], [place], [place] [address] [phone]

## 2018-07-10 ENCOUNTER — Encounter: Payer: Self-pay | Admitting: Urology

## 2018-09-28 ENCOUNTER — Other Ambulatory Visit: Payer: Self-pay | Admitting: Family Medicine

## 2018-09-28 DIAGNOSIS — K2270 Barrett's esophagus without dysplasia: Secondary | ICD-10-CM

## 2018-09-28 DIAGNOSIS — K219 Gastro-esophageal reflux disease without esophagitis: Secondary | ICD-10-CM

## 2018-10-03 ENCOUNTER — Ambulatory Visit: Payer: Medicare Other | Admitting: Family Medicine

## 2018-10-03 ENCOUNTER — Encounter: Payer: Self-pay | Admitting: Family Medicine

## 2018-10-03 VITALS — BP 138/80 | HR 80 | Ht 67.0 in | Wt 208.0 lb

## 2018-10-03 DIAGNOSIS — K219 Gastro-esophageal reflux disease without esophagitis: Secondary | ICD-10-CM

## 2018-10-03 DIAGNOSIS — K2270 Barrett's esophagus without dysplasia: Secondary | ICD-10-CM | POA: Diagnosis not present

## 2018-10-03 DIAGNOSIS — I10 Essential (primary) hypertension: Secondary | ICD-10-CM

## 2018-10-03 DIAGNOSIS — E782 Mixed hyperlipidemia: Secondary | ICD-10-CM | POA: Diagnosis not present

## 2018-10-03 DIAGNOSIS — Z23 Encounter for immunization: Secondary | ICD-10-CM

## 2018-10-03 MED ORDER — PANTOPRAZOLE SODIUM 40 MG PO TBEC: 40.0000 mg | DELAYED_RELEASE_TABLET | Freq: Every day | ORAL | 1 refills | Status: DC

## 2018-10-03 MED ORDER — BENAZEPRIL-HYDROCHLOROTHIAZIDE 10-12.5 MG PO TABS: 1.0000 | ORAL_TABLET | Freq: Every day | ORAL | 1 refills | Status: DC

## 2018-10-03 MED ORDER — ATORVASTATIN CALCIUM 80 MG PO TABS: 80.0000 mg | ORAL_TABLET | Freq: Every day | ORAL | 1 refills | Status: DC

## 2018-10-03 MED ORDER — METOPROLOL SUCCINATE ER 25 MG PO TB24: 25.0000 mg | ORAL_TABLET | Freq: Every day | ORAL | 1 refills | Status: DC

## 2018-10-03 NOTE — Progress Notes (Signed)
Date:  10/03/2018   Name:  Timothy Hatfield   DOB:  [birth year]   MRN:  [id number]   Chief Complaint: Hypertension; Hyperlipidemia; Gastroesophageal Reflux; and Flu Vaccine Hypertension  This is a chronic problem. The current episode started more than 1 year ago. The problem is unchanged. The problem is controlled. Pertinent negatives include no anxiety, blurred vision, chest pain, headaches, malaise/fatigue, neck pain, orthopnea, palpitations, peripheral edema, PND, shortness of breath or sweats. There are no associated agents to hypertension. Risk factors for coronary artery disease include dyslipidemia. Past treatments include ACE inhibitors and diuretics. The current treatment provides moderate improvement. There are no compliance problems.  There is no history of angina, kidney disease, CAD/MI, CVA, heart failure, left ventricular hypertrophy, PVD or retinopathy. There is no history of chronic renal disease, a hypertension causing med or renovascular disease.  Hyperlipidemia  This is a chronic problem. The current episode started more than 1 year ago. The problem is controlled. Recent lipid tests were reviewed and are normal. He has no history of chronic renal disease. Factors aggravating his hyperlipidemia include thiazides. Pertinent negatives include no chest pain, focal sensory loss, focal weakness, leg pain, myalgias or shortness of breath. Current antihyperlipidemic treatment includes statins. The current treatment provides moderate improvement of lipids. There are no compliance problems.  Risk factors for coronary artery disease include diabetes mellitus.  Gastroesophageal Reflux  He reports no abdominal pain, no belching, no chest pain, no choking, no coughing, no dysphagia, no early satiety, no globus sensation, no heartburn, no hoarse voice, no nausea, no sore throat, no stridor or no wheezing. This is a chronic problem. The current episode started today. The problem has been waxing  and waning. The symptoms are aggravated by certain foods. Pertinent negatives include no anemia, fatigue, melena, muscle weakness or orthopnea. He has tried a PPI for the symptoms. The treatment provided moderate relief.     Review of Systems  Constitutional: Negative for chills, fatigue, fever and malaise/fatigue.  HENT: Negative for drooling, ear discharge, ear pain, hoarse voice and sore throat.   Eyes: Negative for blurred vision.  Respiratory: Negative for cough, choking, shortness of breath and wheezing.   Cardiovascular: Negative for chest pain, palpitations, orthopnea, leg swelling and PND.  Gastrointestinal: Negative for abdominal pain, blood in stool, constipation, diarrhea, dysphagia, heartburn, melena and nausea.  Endocrine: Negative for polydipsia.  Genitourinary: Negative for dysuria, frequency, hematuria and urgency.  Musculoskeletal: Negative for back pain, myalgias, muscle weakness and neck pain.  Skin: Negative for rash.  Allergic/Immunologic: Negative for environmental allergies.  Neurological: Negative for dizziness, focal weakness and headaches.  Hematological: Does not bruise/bleed easily.  Psychiatric/Behavioral: Negative for suicidal ideas. The patient is not nervous/anxious.     Patient Active Problem List   Diagnosis Date Noted  . GERD without esophagitis 03/25/2018  . Moderate episode of recurrent major depressive disorder ([place]) 03/25/2018  . Kidney stones 03/15/2018  . Acute calcific periarthritis 06/18/2015  . Allergic rhinitis 06/18/2015  . Arthritis 06/18/2015  . 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 Essential (primary) hypertension 06/18/2015  . 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 HLD (hyperlipidemia) 06/18/2015  . External hemorrhoid 06/18/2015  . Gastroenteritis 06/18/2015  . Calculus of kidney 05/15/2014    Allergies  Allergen Reactions  . Bee Venom Anaphylaxis  . Ciprofloxacin Itching    Past Surgical History:  Procedure Laterality Date    . CARDIAC CATHETERIZATION Left 09/09/2016   Procedure: Left Heart Cath and Coronary Angiography;  Surgeon: Karma Greaser  Prince Rome, MD;  Location: [facility];  Service: Cardiovascular;  Laterality: Left;  . COLONOSCOPY  2013   normal/ Dr Vira Agar- cleared for 10 yrs  . COLONOSCOPY WITH PROPOFOL N/A 01/03/2018   Procedure: COLONOSCOPY WITH PROPOFOL;  Surgeon: Manya Silvas, MD;  Location: [facility] ENDOSCOPY;  Service: Endoscopy;  Laterality: N/A;  . CORONARY ARTERY BYPASS GRAFT    . ESOPHAGOGASTRODUODENOSCOPY (EGD) WITH PROPOFOL N/A 01/03/2018   Procedure: ESOPHAGOGASTRODUODENOSCOPY (EGD) WITH PROPOFOL;  Surgeon: Manya Silvas, MD;  Location: [facility] ENDOSCOPY;  Service: Endoscopy;  Laterality: N/A;  . EYE SURGERY    . HERNIA REPAIR    . IR NEPHROSTOMY PLACEMENT RIGHT  03/15/2018  . kidney stones     lithotripsy  . LITHOTRIPSY    . NEPHROLITHOTOMY Right 03/15/2018   Procedure: NEPHROLITHOTOMY PERCUTANEOUS;  Surgeon: Abbie Sons, MD;  Location: [facility] ORS;  Service: Urology;  Laterality: Right;  . UPPER GI ENDOSCOPY  2013    Social History   Tobacco Use  . Smoking status: Never Smoker  . Smokeless tobacco: Never Used  Substance Use Topics  . Alcohol use: No    Alcohol/week: 0.0 standard drinks  . Drug use: No     Medication list has been reviewed and updated.  Current Meds  Medication Sig  . acetaminophen (TYLENOL) 500 MG tablet Take 1,000 mg by mouth daily as needed for moderate pain.  Marland Kitchen aspirin EC 81 MG tablet Take 81 mg by mouth daily.  Marland Kitchen atorvastatin (LIPITOR) 80 MG tablet Take 1 tablet (80 mg total) by mouth daily.  . benazepril-hydrochlorthiazide (LOTENSIN HCT) 10-12.5 MG tablet Take 1 tablet by mouth daily.  . Coenzyme Q10 (COQ-10) 100 MG CAPS Take 100 mg by mouth at bedtime.  . Dutasteride-Tamsulosin HCl (JALYN) 0.5-0.4 MG CAPS Take 1 capsule by mouth daily. Urology- [facility] (Patient taking differently: Take 1 capsule by mouth every Monday, Wednesday, and Friday.  Urology- Stoioff)  . etodolac (LODINE) 400 MG tablet TAKE 1 TABLET (400 MG TOTAL) 2 (TWO) TIMES DAILY BY MOUTH.  . fluticasone (FLONASE) 50 MCG/ACT nasal spray Place 1 spray into both nostrils daily. otc  . metoprolol succinate (TOPROL-XL) 25 MG 24 hr tablet Take 1 tablet (25 mg total) by mouth daily.  . Misc Natural Products (GLUCOSAMINE CHOND DOUBLE STR PO) Take 2 tablets by mouth at bedtime.  . Multiple Vitamin (MULTIVITAMIN WITH MINERALS) TABS tablet Take 1 tablet by mouth at bedtime.  . nitroGLYCERIN (NITROSTAT) 0.4 MG SL tablet Place 1 tablet (0.4 mg total) under the tongue every 5 (five) minutes as needed for chest pain.  . nortriptyline (PAMELOR) 10 MG capsule Take 20 mg by mouth at bedtime. neuro  . pantoprazole (PROTONIX) 40 MG tablet Take 1 tablet (40 mg total) by mouth daily.  . [DISCONTINUED] atorvastatin (LIPITOR) 80 MG tablet Take 1 tablet (80 mg total) by mouth daily.  . [DISCONTINUED] benazepril-hydrochlorthiazide (LOTENSIN HCT) 10-12.5 MG tablet Take 1 tablet by mouth daily.  . [DISCONTINUED] metoprolol succinate (TOPROL-XL) 25 MG 24 hr tablet Take 1 tablet (25 mg total) by mouth daily.  . [DISCONTINUED] pantoprazole (PROTONIX) 40 MG tablet TAKE 1 TABLET BY MOUTH EVERY DAY    PHQ 2/9 Scores 10/03/2018 09/28/2017 05/22/2016 11/06/2015  PHQ - 2 Score 0 0 0 0  PHQ- 9 Score 0 - - -    Physical Exam  Constitutional: He is oriented to person, place, and time.  HENT:  Head: Normocephalic.  Right Ear: External ear normal.  Left Ear: External ear  normal.  Nose: Nose normal.  Mouth/Throat: Oropharynx is clear and moist.  Eyes: Pupils are equal, round, and reactive to light. Conjunctivae and EOM are normal. Right eye exhibits no discharge. Left eye exhibits no discharge. No scleral icterus.  Neck: Normal range of motion. Neck supple. No JVD present. No tracheal deviation present. No thyromegaly present.  Cardiovascular: Normal rate, regular rhythm, normal heart sounds and intact  distal pulses. Exam reveals no gallop and no friction rub.  No murmur heard. Pulmonary/Chest: Breath sounds normal. No respiratory distress. He has no wheezes. He has no rales.  Abdominal: Soft. Bowel sounds are normal. He exhibits no mass. There is no hepatosplenomegaly. There is no tenderness. There is no rebound, no guarding and no CVA tenderness.  Musculoskeletal: Normal range of motion. He exhibits no edema or tenderness.  Lymphadenopathy:    He has no cervical adenopathy.  Neurological: He is alert and oriented to person, place, and time. He has normal strength and normal reflexes. No cranial nerve deficit.  Skin: Skin is warm. No rash noted.  Nursing note and vitals reviewed.   BP 138/80   Pulse 80   Ht 5\' 7"  (1.702 m)   Wt 208 lb (94.3 kg)   BMI 32.58 kg/m   Assessment and Plan:  1. GERD without esophagitis Stable on med- refill pantoprazole - pantoprazole (PROTONIX) 40 MG tablet; Take 1 tablet (40 mg total) by mouth daily.  Dispense: 90 tablet; Refill: 1  2. Barrett's esophagus without dysplasia Stable on med- refill pantoprazole - pantoprazole (PROTONIX) 40 MG tablet; Take 1 tablet (40 mg total) by mouth daily.  Dispense: 90 tablet; Refill: 1  3. Essential (primary) hypertension Stable on meds- refill metoprolol and benazepril/HCTZ/ draw renal panel - metoprolol succinate (TOPROL-XL) 25 MG 24 hr tablet; Take 1 tablet (25 mg total) by mouth daily.  Dispense: 90 tablet; Refill: 1 - benazepril-hydrochlorthiazide (LOTENSIN HCT) 10-12.5 MG tablet; Take 1 tablet by mouth daily.  Dispense: 90 tablet; Refill: 1 - Renal Function Panel  4. Mixed hyperlipidemia Stable on med- refill atorvastatin/ draw lipid panel - atorvastatin (LIPITOR) 80 MG tablet; Take 1 tablet (80 mg total) by mouth daily.  Dispense: 90 tablet; Refill: 1 - Lipid panel  5. Flu vaccine need Discussed and administered - Flu vaccine HIGH DOSE PF (Fluzone High dose)  Dr. [facility]  10/03/2018

## 2018-10-04 LAB — RENAL FUNCTION PANEL
Albumin: 4.3 g/dL (ref 3.5–4.8)
BUN/Creatinine Ratio: 12 (ref 10–24)
BUN: 14 mg/dL (ref 8–27)
CHLORIDE: 104 mmol/L (ref 96–106)
CO2: 22 mmol/L (ref 20–29)
CREATININE: 1.16 mg/dL (ref 0.76–1.27)
Calcium: 9.5 mg/dL (ref 8.6–10.2)
GFR calc Af Amer: 72 mL/min/{1.73_m2} (ref >59)
GFR calc non Af Amer: 62 mL/min/{1.73_m2} (ref >59)
Glucose: 108 mg/dL — ABNORMAL HIGH (ref 65–99)
Phosphorus: 3.4 mg/dL (ref 2.5–4.5)
Potassium: 4.3 mmol/L (ref 3.5–5.2)
Sodium: 144 mmol/L (ref 134–144)

## 2018-10-04 LAB — LIPID PANEL
CHOLESTEROL TOTAL: 135 mg/dL (ref 100–199)
Chol/HDL Ratio: 2.6 ratio (ref 0.0–5.0)
HDL: 51 mg/dL (ref >39)
LDL Calculated: 60 mg/dL (ref 0–99)
Triglycerides: 122 mg/dL (ref 0–149)
VLDL CHOLESTEROL CAL: 24 mg/dL (ref 5–40)

## 2018-12-29 ENCOUNTER — Encounter: Payer: Self-pay | Admitting: Family Medicine

## 2018-12-29 ENCOUNTER — Ambulatory Visit: Payer: Medicare Other | Admitting: Family Medicine

## 2018-12-29 VITALS — BP 138/78 | HR 85 | Resp 16 | Ht 67.0 in | Wt 200.0 lb

## 2018-12-29 DIAGNOSIS — I10 Essential (primary) hypertension: Secondary | ICD-10-CM | POA: Diagnosis not present

## 2018-12-29 MED ORDER — METOPROLOL SUCCINATE ER 25 MG PO TB24: 25.0000 mg | ORAL_TABLET | Freq: Every day | ORAL | 1 refills | Status: DC

## 2018-12-29 MED ORDER — BENAZEPRIL-HYDROCHLOROTHIAZIDE 10-12.5 MG PO TABS: 1.0000 | ORAL_TABLET | Freq: Every day | ORAL | 1 refills | Status: DC

## 2018-12-29 NOTE — Progress Notes (Signed)
Date:  12/29/2018   Name:  Timothy Hatfield   DOB:  02-12-1946   MRN:  [id number]   Chief Complaint: Hypertension  Hypertension  This is a chronic problem. The current episode started more than 1 year ago. The problem is unchanged. The problem is controlled. Pertinent negatives include no anxiety, blurred vision, chest pain, headaches, malaise/fatigue, neck pain, orthopnea, palpitations, peripheral edema, PND, shortness of breath or sweats. There are no associated agents to hypertension. Risk factors for coronary artery disease include dyslipidemia, male gender and obesity. Past treatments include ACE inhibitors, diuretics and beta blockers. The current treatment provides moderate improvement. There are no compliance problems.  There is no history of angina, kidney disease, CAD/MI, CVA, heart failure, left ventricular hypertrophy, PVD or retinopathy. There is no history of chronic renal disease, a hypertension causing med or renovascular disease.    Review of Systems  Constitutional: Negative for chills, fever and malaise/fatigue.  HENT: Negative for drooling, ear discharge, ear pain and sore throat.   Eyes: Negative for blurred vision.  Respiratory: Negative for cough, shortness of breath and wheezing.   Cardiovascular: Negative for chest pain, palpitations, orthopnea, leg swelling and PND.  Gastrointestinal: Negative for abdominal pain, blood in stool, constipation, diarrhea and nausea.  Endocrine: Negative for polydipsia.  Genitourinary: Negative for dysuria, frequency, hematuria and urgency.  Musculoskeletal: Negative for back pain, myalgias and neck pain.  Skin: Negative for rash.  Allergic/Immunologic: Negative for environmental allergies.  Neurological: Negative for dizziness and headaches.  Hematological: Does not bruise/bleed easily.  Psychiatric/Behavioral: Negative for suicidal ideas. The patient is not nervous/anxious.     Patient Active Problem List   Diagnosis Date  Noted  . GERD without esophagitis 03/25/2018  . Moderate episode of recurrent major depressive disorder ([place]) 03/25/2018  . Kidney stones 03/15/2018  . Acute calcific periarthritis 06/18/2015  . Allergic rhinitis 06/18/2015  . Arthritis 06/18/2015  . 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 Essential (primary) hypertension 06/18/2015  . 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 HLD (hyperlipidemia) 06/18/2015  . External hemorrhoid 06/18/2015  . Gastroenteritis 06/18/2015  . Calculus of kidney 05/15/2014    Allergies  Allergen Reactions  . Bee Venom Anaphylaxis  . Ciprofloxacin Itching    Past Surgical History:  Procedure Laterality Date  . CARDIAC CATHETERIZATION Left 09/09/2016   Procedure: Left Heart Cath and Coronary Angiography;  Surgeon: Yolonda Kida, MD;  Location: [place] [facility];  Service: Cardiovascular;  Laterality: Left;  . COLONOSCOPY  2013   normal/ Dr Vira Agar- cleared for 10 yrs  . COLONOSCOPY WITH PROPOFOL N/A 01/03/2018   Procedure: COLONOSCOPY WITH PROPOFOL;  Surgeon: Manya Silvas, MD;  Location: [facility] ENDOSCOPY;  Service: Endoscopy;  Laterality: N/A;  . CORONARY ARTERY BYPASS GRAFT    . ESOPHAGOGASTRODUODENOSCOPY (EGD) WITH PROPOFOL N/A 01/03/2018   Procedure: ESOPHAGOGASTRODUODENOSCOPY (EGD) WITH PROPOFOL;  Surgeon: Manya Silvas, MD;  Location: [facility] ENDOSCOPY;  Service: Endoscopy;  Laterality: N/A;  . EYE SURGERY    . HERNIA REPAIR    . IR NEPHROSTOMY PLACEMENT RIGHT  03/15/2018  . kidney stones     lithotripsy  . LITHOTRIPSY    . NEPHROLITHOTOMY Right 03/15/2018   Procedure: NEPHROLITHOTOMY PERCUTANEOUS;  Surgeon: Abbie Sons, MD;  Location: [facility] ORS;  Service: Urology;  Laterality: Right;  . UPPER GI ENDOSCOPY  2013    Social History   Tobacco Use  . Smoking status: Never Smoker  . Smokeless tobacco: Never Used  Substance Use Topics  .  Alcohol use: No    Alcohol/week: 0.0 standard drinks  . Drug use: No     Medication list has  been reviewed and updated.  Current Meds  Medication Sig  . acetaminophen (TYLENOL) 500 MG tablet Take 1,000 mg by mouth daily as needed for moderate pain.  Marland Kitchen aspirin EC 81 MG tablet Take 81 mg by mouth daily.  Marland Kitchen atorvastatin (LIPITOR) 80 MG tablet Take 1 tablet (80 mg total) by mouth daily.  . benazepril-hydrochlorthiazide (LOTENSIN HCT) 10-12.5 MG tablet Take 1 tablet by mouth daily.  . Coenzyme Q10 (COQ-10) 100 MG CAPS Take 100 mg by mouth at bedtime.  . Dutasteride-Tamsulosin HCl (JALYN) 0.5-0.4 MG CAPS Take 1 capsule by mouth daily. Urology- [facility] (Patient taking differently: Take 1 capsule by mouth every Monday, Wednesday, and Friday. Urology- Stoioff)  . etodolac (LODINE) 400 MG tablet TAKE 1 TABLET (400 MG TOTAL) 2 (TWO) TIMES DAILY BY MOUTH.  . fluticasone (FLONASE) 50 MCG/ACT nasal spray Place 1 spray into both nostrils daily. otc  . metoprolol succinate (TOPROL-XL) 25 MG 24 hr tablet Take 1 tablet (25 mg total) by mouth daily.  . Misc Natural Products (GLUCOSAMINE CHOND DOUBLE STR PO) Take 2 tablets by mouth at bedtime.  . Multiple Vitamin (MULTIVITAMIN WITH MINERALS) TABS tablet Take 1 tablet by mouth at bedtime.  . nitroGLYCERIN (NITROSTAT) 0.4 MG SL tablet Place 1 tablet (0.4 mg total) under the tongue every 5 (five) minutes as needed for chest pain.  . nortriptyline (PAMELOR) 10 MG capsule Take 20 mg by mouth at bedtime. neuro  . pantoprazole (PROTONIX) 40 MG tablet Take 1 tablet (40 mg total) by mouth daily.    PHQ 2/9 Scores 12/29/2018 10/03/2018 09/28/2017 05/22/2016  PHQ - 2 Score 0 0 0 0  PHQ- 9 Score 0 0 - -    Physical Exam Vitals signs and nursing note reviewed.  HENT:     Head: Normocephalic.     Right Ear: External ear normal.     Left Ear: External ear normal.     Nose: Nose normal. No congestion or rhinorrhea.  Eyes:     General: No scleral icterus.       Right eye: No discharge.        Left eye: No discharge.     Conjunctiva/sclera: Conjunctivae normal.       Pupils: Pupils are equal, round, and reactive to light.  Neck:     Musculoskeletal: Normal range of motion and neck supple. No neck rigidity or muscular tenderness.     Thyroid: No thyromegaly.     Vascular: No carotid bruit or JVD.     Trachea: No tracheal deviation.  Cardiovascular:     Rate and Rhythm: Normal rate and regular rhythm.     Pulses: Normal pulses.     Heart sounds: Normal heart sounds. No murmur. No friction rub. No gallop.   Pulmonary:     Effort: Pulmonary effort is normal. No respiratory distress.     Breath sounds: Normal breath sounds. No stridor. No wheezing, rhonchi or rales.  Chest:     Chest wall: No tenderness.  Abdominal:     General: Bowel sounds are normal.     Palpations: Abdomen is soft. There is no mass.     Tenderness: There is no abdominal tenderness. There is no guarding or rebound.  Musculoskeletal: Normal range of motion.        General: No swelling, tenderness, deformity or signs of injury.  Right lower leg: No edema.     Left lower leg: No edema.  Lymphadenopathy:     Cervical: No cervical adenopathy.  Skin:    General: Skin is warm.     Capillary Refill: Capillary refill takes less than 2 seconds.     Coloration: Skin is not jaundiced or pale.     Findings: No bruising, erythema, lesion or rash.  Neurological:     Mental Status: He is alert and oriented to person, place, and time.     Cranial Nerves: No cranial nerve deficit.     Motor: No weakness.     Deep Tendon Reflexes: Reflexes are normal and symmetric.     BP 138/78   Pulse 85   Resp 16   Ht 5\' 7"  (1.702 m)   Wt 200 lb (90.7 kg)   SpO2 98%   BMI 31.32 kg/m   Assessment and Plan: 1. Essential (primary) hypertension Chronic. Controlled on med- refill metoprolol and benazepril- hctz - metoprolol succinate (TOPROL-XL) 25 MG 24 hr tablet; Take 1 tablet (25 mg total) by mouth daily.  Dispense: 90 tablet; Refill: 1 - benazepril-hydrochlorthiazide (LOTENSIN HCT) 10-12.5  MG tablet; Take 1 tablet by mouth daily.  Dispense: 90 tablet; Refill: 1

## 2019-01-05 ENCOUNTER — Other Ambulatory Visit: Payer: Self-pay | Admitting: Urology

## 2019-01-05 ENCOUNTER — Ambulatory Visit
Admission: RE | Admit: 2019-01-05 | Discharge: 2019-01-05 | Disposition: A | Discharge status: Home / Self Care | Payer: Medicare Other | Attending: Urology | Admitting: Urology

## 2019-01-05 ENCOUNTER — Ambulatory Visit
Admission: RE | Admit: 2019-01-05 | Discharge: 2019-01-05 | Disposition: A | Discharge status: Home / Self Care | Payer: Medicare Other | Source: Ambulatory Visit | Attending: Urology | Admitting: Urology

## 2019-01-05 DIAGNOSIS — N200 Calculus of kidney: Secondary | ICD-10-CM | POA: Diagnosis present

## 2019-01-06 ENCOUNTER — Encounter: Payer: Self-pay | Admitting: Urology

## 2019-01-06 ENCOUNTER — Ambulatory Visit: Payer: Medicare Other | Admitting: Urology

## 2019-01-06 VITALS — BP 148/80 | HR 91 | Ht 67.0 in | Wt 200.0 lb

## 2019-01-06 DIAGNOSIS — N200 Calculus of kidney: Secondary | ICD-10-CM

## 2019-01-06 NOTE — Progress Notes (Signed)
01/06/2019 2:47 PM   Timothy Hatfield 16-Mar-1946 [id number]  Referring provider: Juline Patch, MD [address] [place], [place] [address]  Chief Complaint  Patient presents with  . Nephrolithiasis   Urologic history: 1.  Nephrolithiasis  - Right PCNL 02/2018 for two 14 and 17 mm renal pelvic calculi  - Prior history of shockwave lithotripsy  - Small bilateral renal calculi on prior CT  2.  History elevated PSA  - Prostate biopsy October 2010 PSA 4.9; benign pathology   HPI: 73 year old male presents for semiannual follow-up.  Follow-up imaging showed a probable 3 mm right renal stone fragment.  He remains asymptomatic and specifically denies flank/abdominal/pelvic or scrotal pain. Denies gross hematuria.  He remains on dutasteride/tamsulosin.  Uncorrected PSA April 2019 1.5   PMH: Past Medical History:  Diagnosis Date  . Allergy   . BPH (benign prostatic hyperplasia)   . Coronary artery disease   . GERD (gastroesophageal reflux disease)   . History of kidney stones   . Hyperlipidemia   . Hypertension   . Pneumonia   . Renal disorder    kidney stones  . Sleep apnea    CPAP    Surgical History: Past Surgical History:  Procedure Laterality Date  . CARDIAC CATHETERIZATION Left 09/09/2016   Procedure: Left Heart Cath and Coronary Angiography;  Surgeon: Yolonda Kida, MD;  Location: [facility];  Service: Cardiovascular;  Laterality: Left;  . COLONOSCOPY  2013   normal/ Dr Vira Agar- cleared for 10 yrs  . COLONOSCOPY WITH PROPOFOL N/A 01/03/2018   Procedure: COLONOSCOPY WITH PROPOFOL;  Surgeon: Manya Silvas, MD;  Location: [facility] ENDOSCOPY;  Service: Endoscopy;  Laterality: N/A;  . CORONARY ARTERY BYPASS GRAFT    . ESOPHAGOGASTRODUODENOSCOPY (EGD) WITH PROPOFOL N/A 01/03/2018   Procedure: ESOPHAGOGASTRODUODENOSCOPY (EGD) WITH PROPOFOL;  Surgeon: Manya Silvas, MD;  Location: [facility] ENDOSCOPY;  Service: Endoscopy;  Laterality: N/A;  .  EYE SURGERY    . HERNIA REPAIR    . IR NEPHROSTOMY PLACEMENT RIGHT  03/15/2018  . kidney stones     lithotripsy  . LITHOTRIPSY    . NEPHROLITHOTOMY Right 03/15/2018   Procedure: NEPHROLITHOTOMY PERCUTANEOUS;  Surgeon: Abbie Sons, MD;  Location: [facility];  Service: Urology;  Laterality: Right;  . UPPER GI ENDOSCOPY  2013    Home Medications:  Allergies as of 01/06/2019      Reactions   Bee Venom Anaphylaxis   Ciprofloxacin Itching      Medication List       Accurate as of January 06, 2019  2:47 PM. Always use your most recent med list.        acetaminophen 500 MG tablet Commonly known as:  TYLENOL Take 1,000 mg by mouth daily as needed for moderate pain.   aspirin EC 81 MG tablet Take 81 mg by mouth daily.   atorvastatin 80 MG tablet Commonly known as:  LIPITOR Take 1 tablet (80 mg total) by mouth daily.   benazepril-hydrochlorthiazide 10-12.5 MG tablet Commonly known as:  LOTENSIN HCT Take 1 tablet by mouth daily.   CoQ-10 100 MG Caps Take 100 mg by mouth at bedtime.   Dutasteride-Tamsulosin HCl 0.5-0.4 MG Caps Commonly known as:  JALYN Take 1 capsule by mouth daily. Urology-    etodolac 400 MG tablet Commonly known as:  LODINE TAKE 1 TABLET (400 MG TOTAL) 2 (TWO) TIMES DAILY BY MOUTH.   fluticasone 50 MCG/ACT nasal spray Commonly known as:  [place]  1 spray into both nostrils daily. otc   GLUCOSAMINE CHOND DOUBLE STR PO Take 2 tablets by mouth at bedtime.   metoprolol succinate 25 MG 24 hr tablet Commonly known as:  TOPROL-XL Take 1 tablet (25 mg total) by mouth daily.   multivitamin with minerals Tabs tablet Take 1 tablet by mouth at bedtime.   nitroGLYCERIN 0.4 MG SL tablet Commonly known as:  NITROSTAT Place 1 tablet (0.4 mg total) under the tongue every 5 (five) minutes as needed for chest pain.   nortriptyline 10 MG capsule Commonly known as:  PAMELOR Take 20 mg by mouth at bedtime. neuro   pantoprazole 40 MG  tablet Commonly known as:  PROTONIX Take 1 tablet (40 mg total) by mouth daily.       Allergies:  Allergies  Allergen Reactions  . Bee Venom Anaphylaxis  . Ciprofloxacin Itching    Family History: Family History  Problem Relation Age of Onset  . Arrhythmia Mother   . Coronary artery disease Father   . Diabetes Father   . Hyperlipidemia Brother     Social History:  reports that he has never smoked. He has never used smokeless tobacco. He reports that he does not drink alcohol or use drugs.  ROS: UROLOGY Frequent Urination?: No Hard to postpone urination?: No Burning/pain with urination?: No Get up at night to urinate?: No Leakage of urine?: No Urine stream starts and stops?: No Trouble starting stream?: No Do you have to strain to urinate?: No Blood in urine?: No Urinary tract infection?: No Sexually transmitted disease?: No Injury to kidneys or bladder?: No Painful intercourse?: No Weak stream?: No Erection problems?: No Penile pain?: No  Gastrointestinal Nausea?: No Vomiting?: No Indigestion/heartburn?: No Diarrhea?: No Constipation?: No  Constitutional Fever: No Night sweats?: No Weight loss?: No Fatigue?: No  Skin Skin rash/lesions?: No Itching?: No  Eyes Blurred vision?: No Double vision?: No  Ears/Nose/Throat Sore throat?: No Sinus problems?: No  Hematologic/Lymphatic Swollen glands?: No Easy bruising?: No  Cardiovascular Leg swelling?: No Chest pain?: No  Respiratory Cough?: No Shortness of breath?: No  Endocrine Excessive thirst?: No  Musculoskeletal Back pain?: No Joint pain?: No  Neurological Headaches?: No Dizziness?: No  Psychologic Depression?: No Anxiety?: No  Physical Exam: BP (!) 148/80 (BP Location: Left Arm, Patient Position: Sitting, Cuff Size: Normal)   Pulse 91   Ht 5\' 7"  (1.702 m)   Wt 200 lb (90.7 kg)   BMI 31.32 kg/m   Constitutional:  Alert and oriented, No acute distress. HEENT: [place] AT,  moist mucus membranes.  Trachea midline, no masses. Cardiovascular: No clubbing, cyanosis, or edema. Respiratory: Normal respiratory effort, no increased work of breathing.. Skin: No rashes, bruises or suspicious lesions. Neurologic: Grossly intact, no focal deficits, moving all 4 extremities. Psychiatric: Normal mood and affect.   Pertinent Imaging: 3 mm small calcification remains.  No definite left sided calculi seen on my review  Results for orders placed during the hospital encounter of 01/05/19  Abdomen 1 view (KUB)   Narrative CLINICAL DATA:  Six-month follow-up right renal stone. Asymptomatic.  EXAM: ABDOMEN - 1 VIEW  COMPARISON:  07/05/2018  FINDINGS: Bowel gas pattern is nonobstructive with mild fecal retention throughout the colon. 2 surgical clips over the left upper quadrant. Couple small subcentimeter calcific densities project over the right kidney possibly stones. Subcentimeter calcification projects over the lower pole of the left kidney possibly a stone. No abnormal calcifications over the pelvis. Mild degenerate change of the spine and hips.  IMPRESSION: Nonobstructive bowel gas pattern with mild fecal retention throughout the colon.  Suggestion of bilateral nephrolithiasis.   Electronically Signed   By: Marin Olp M.D.   On: 01/05/2019 21:05     Assessment & Plan:   73 year old male with nephrolithiasis.  He is presently asymptomatic and we will continue to monitor.  Recommend follow-up visit with KUB in 1 year.   Abbie Sons, [place] [address] [place], [place] [address] [phone]

## 2019-01-06 NOTE — Patient Instructions (Signed)
Dietary Guidelines to Help Prevent Kidney Stones Kidney stones are deposits of minerals and salts that form inside your kidneys. Your risk of developing kidney stones may be greater depending on your diet, your lifestyle, the medicines you take, and whether you have certain medical conditions. Most people can reduce their chances of developing kidney stones by following the instructions below. Depending on your overall health and the type of kidney stones you tend to develop, your dietitian may give you more specific instructions. What are tips for following this plan? Reading food labels  Choose foods with "no salt added" or "low-salt" labels. Limit your sodium intake to less than 1500 mg per day.  Choose foods with calcium for each meal and snack. Try to eat about 300 mg of calcium at each meal. Foods that contain 200-500 mg of calcium per serving include: ? 8 oz (237 ml) of milk, fortified nondairy milk, and fortified fruit juice. ? 8 oz (237 ml) of kefir, yogurt, and soy yogurt. ? 4 oz (118 ml) of tofu. ? 1 oz of cheese. ? 1 cup (300 g) of dried figs. ? 1 cup (91 g) of cooked broccoli. ? 1-3 oz can of sardines or mackerel.  Most people need 1000 to 1500 mg of calcium each day. Talk to your dietitian about how much calcium is recommended for you. Shopping  Buy plenty of fresh fruits and vegetables. Most people do not need to avoid fruits and vegetables, even if they contain nutrients that may contribute to kidney stones.  When shopping for convenience foods, choose: ? Whole pieces of fruit. ? Premade salads with dressing on the side. ? Low-fat fruit and yogurt smoothies.  Avoid buying frozen meals or prepared deli foods.  Look for foods with live cultures, such as yogurt and kefir. Cooking  Do not add salt to food when cooking. Place a salt shaker on the table and allow each person to add his or her own salt to taste.  Use vegetable protein, such as beans, textured vegetable  protein (TVP), or tofu instead of meat in pasta, casseroles, and soups. Meal planning   Eat less salt, if told by your dietitian. To do this: ? Avoid eating processed or premade food. ? Avoid eating fast food.  Eat less animal protein, including cheese, meat, poultry, or fish, if told by your dietitian. To do this: ? Limit the number of times you have meat, poultry, fish, or cheese each week. Eat a diet free of meat at least 2 days a week. ? Eat only one serving each day of meat, poultry, fish, or seafood. ? When you prepare animal protein, cut pieces into small portion sizes. For most meat and fish, one serving is about the size of one deck of cards.  Eat at least 5 servings of fresh fruits and vegetables each day. To do this: ? Keep fruits and vegetables on hand for snacks. ? Eat 1 piece of fruit or a handful of berries with breakfast. ? Have a salad and fruit at lunch. ? Have two kinds of vegetables at dinner.  Limit foods that are high in a substance called oxalate. These include: ? Spinach. ? Rhubarb. ? Beets. ? Potato chips and french fries. ? Nuts.  If you regularly take a diuretic medicine, make sure to eat at least 1-2 fruits or vegetables high in potassium each day. These include: ? Avocado. ? Banana. ? Orange, prune, carrot, or tomato juice. ? Baked potato. ? Cabbage. ? Beans and split   If you regularly take a diuretic medicine, make sure to eat at least 1-2 fruits or vegetables high in potassium each day. These include:  ? Avocado.  ? Banana.  ? Orange, prune, carrot, or tomato juice.  ? Baked potato.  ? Cabbage.  ? Beans and split peas.  General instructions     Drink enough fluid to keep your urine clear or pale yellow. This is the most important thing you can do.   Talk to your health care provider and dietitian about taking daily supplements. Depending on your health and the cause of your kidney stones, you may be advised:  ? Not to take supplements with vitamin C.  ? To take a calcium supplement.  ? To take a daily probiotic supplement.  ? To take other supplements such as magnesium, fish oil, or vitamin B6.   Take all medicines and supplements as told by your health care provider.   Limit alcohol intake to no  more than 1 drink a day for nonpregnant women and 2 drinks a day for men. One drink equals 12 oz of beer, 5 oz of wine, or 1 oz of hard liquor.   Lose weight if told by your health care provider. Work with your dietitian to find strategies and an eating plan that works best for you.  What foods are not recommended?  Limit your intake of the following foods, or as told by your dietitian. Talk to your dietitian about specific foods you should avoid based on the type of kidney stones and your overall health.  Grains  Breads. Bagels. Rolls. Baked goods. Salted crackers. Cereal. Pasta.  Vegetables  Spinach. Rhubarb. Beets. Canned vegetables. Pickles. Olives.  Meats and other protein foods  Nuts. Nut butters. Large portions of meat, poultry, or fish. Salted or cured meats. Deli meats. Hot dogs. Sausages.  Dairy  Cheese.  Beverages  Regular soft drinks. Regular vegetable juice.  Seasonings and other foods  Seasoning blends with salt. Salad dressings. Canned soups. Soy sauce. Ketchup. Barbecue sauce. Canned pasta sauce. Casseroles. Pizza. Lasagna. Frozen meals. Potato chips. French fries.  Summary   You can reduce your risk of kidney stones by making changes to your diet.   The most important thing you can do is drink enough fluid. You should drink enough fluid to keep your urine clear or pale yellow.   Ask your health care provider or dietitian how much protein from animal sources you should eat each day, and also how much salt and calcium you should have each day.  This information is not intended to replace advice given to you by your health care provider. Make sure you discuss any questions you have with your health care provider.  Document Released: 03/06/2011 Document Revised: 10/20/2016 Document Reviewed: 10/20/2016  Elsevier Interactive Patient Education  2019 [employer]vier Inc[employer]

## 2019-01-08 ENCOUNTER — Encounter: Payer: Self-pay | Admitting: Urology

## 2019-01-20 ENCOUNTER — Other Ambulatory Visit: Payer: Self-pay | Admitting: Family Medicine

## 2019-01-23 ENCOUNTER — Telehealth: Payer: Self-pay | Admitting: Family Medicine

## 2019-01-23 NOTE — Telephone Encounter (Signed)
Patient need refills for  etodolac (LODINE) 400 MG tablet  1 06/04/2018    Sig: TAKE 1 TABLET (400 MG TOTAL) 2 (TWO) TIMES DAILY BY MOUTH    [id number] [address], [place] DEA #:  [id number]

## 2019-05-10 ENCOUNTER — Telehealth: Payer: Self-pay | Admitting: Urology

## 2019-05-10 ENCOUNTER — Other Ambulatory Visit: Payer: Medicare Other

## 2019-05-10 ENCOUNTER — Ambulatory Visit
Admission: RE | Admit: 2019-05-10 | Discharge: 2019-05-10 | Disposition: A | Discharge status: Home / Self Care | Payer: Medicare Other | Source: Ambulatory Visit | Attending: Urology | Admitting: Urology

## 2019-05-10 ENCOUNTER — Ambulatory Visit
Admission: RE | Admit: 2019-05-10 | Discharge: 2019-05-10 | Disposition: A | Discharge status: Home / Self Care | Payer: Medicare Other | Attending: Urology | Admitting: Urology

## 2019-05-10 ENCOUNTER — Other Ambulatory Visit: Payer: Self-pay

## 2019-05-10 DIAGNOSIS — N200 Calculus of kidney: Secondary | ICD-10-CM | POA: Insufficient documentation

## 2019-05-10 NOTE — Progress Notes (Signed)
Patient came to office today complaining of right flank pain UA and KUB orderd=ed

## 2019-05-10 NOTE — Telephone Encounter (Signed)
Pt walked into front office and is having flank pain, he has hx of kidney stones. I spoke with Judson Roch and she advised he do UA and KUB.

## 2019-05-11 ENCOUNTER — Telehealth: Payer: Self-pay

## 2019-05-11 DIAGNOSIS — N23 Unspecified renal colic: Secondary | ICD-10-CM

## 2019-05-11 DIAGNOSIS — R3129 Other microscopic hematuria: Secondary | ICD-10-CM

## 2019-05-11 DIAGNOSIS — N200 Calculus of kidney: Secondary | ICD-10-CM

## 2019-05-11 LAB — URINALYSIS, COMPLETE
Bilirubin, UA: NEGATIVE
Glucose, UA: NEGATIVE
Ketones, UA: NEGATIVE
Leukocytes,UA: NEGATIVE
Nitrite, UA: NEGATIVE
Protein,UA: NEGATIVE
Specific Gravity, UA: 1.015 (ref 1.005–1.030)
Urobilinogen, Ur: 0.2 mg/dL (ref 0.2–1.0)
pH, UA: 6 (ref 5.0–7.5)

## 2019-05-11 LAB — MICROSCOPIC EXAMINATION: Bacteria, UA: NONE SEEN

## 2019-05-11 NOTE — Telephone Encounter (Signed)
Recommend a stone protocol CT.  Order was entered.  Will call with results.

## 2019-05-11 NOTE — Telephone Encounter (Signed)
Patient notified

## 2019-05-11 NOTE — Telephone Encounter (Signed)
-----   Message from Abbie Sons, MD sent at 05/11/2019  7:31 AM EDT ----- KUB reviewed.  There are bilateral, nonobstructing renal calculi.  He may have a left ureteral calculus.  Which side is he hurting on?

## 2019-05-11 NOTE — Telephone Encounter (Signed)
Patient was complaining of right flank pain

## 2019-05-12 ENCOUNTER — Other Ambulatory Visit: Payer: Self-pay

## 2019-05-24 ENCOUNTER — Ambulatory Visit
Admission: RE | Admit: 2019-05-24 | Discharge: 2019-05-24 | Disposition: A | Discharge status: Home / Self Care | Payer: Medicare Other | Source: Ambulatory Visit | Attending: Urology | Admitting: Urology

## 2019-05-24 ENCOUNTER — Other Ambulatory Visit: Payer: Self-pay

## 2019-05-24 DIAGNOSIS — N200 Calculus of kidney: Secondary | ICD-10-CM

## 2019-05-24 DIAGNOSIS — R3129 Other microscopic hematuria: Secondary | ICD-10-CM | POA: Insufficient documentation

## 2019-05-24 DIAGNOSIS — N23 Unspecified renal colic: Secondary | ICD-10-CM

## 2019-06-22 ENCOUNTER — Other Ambulatory Visit: Payer: Self-pay | Admitting: Family Medicine

## 2019-06-22 DIAGNOSIS — E782 Mixed hyperlipidemia: Secondary | ICD-10-CM

## 2019-06-27 ENCOUNTER — Telehealth: Payer: Self-pay | Admitting: Family Medicine

## 2019-06-27 NOTE — Telephone Encounter (Signed)
Patient called in for refills, would like 90 tablets instead.   atorvastatin (LIPITOR) 80 MG tablet [[id number]]   Order Details Dose, Route, Frequency: As Directed  Dispense Quantity: 90 tablet Refills: 0 Fills remaining: --        Sig: TAKE 1 TABLET BY MOUTH EVERY DAY       Written Date: 06/22/19 Expiration Date: 06/21/20    Start Date: 06/22/19 End Date: --         Ordering Provider: Juline Patch, MD DEA #:  [id number] NPI:  [id number]   Authorizing Provider: Juline Patch, MD DEA #:  [id number] NPI:  [id number]   Ordering User:  Fredderick Severance          Diagnosis Association: Mixed hyperlipidemia (E78.2)    Original Order:  atorvastatin (LIPITOR) 80 MG tablet [[id number]]    Pharmacy:  [id number] [address], [place]  DEA #:  [id number]

## 2019-07-15 IMAGING — CR DG ABDOMEN 1V
1 series · 2 of 2 positions shown · non-contrast
Comparison: 05/09/2015 lumbar radiography

CLINICAL DATA: Kidney stones

EXAM:
ABDOMEN - 1 VIEW

[Series 1: dg abd 1 view · 0.14mm/px · 2 of 2 slices shown]
[im 1/2]
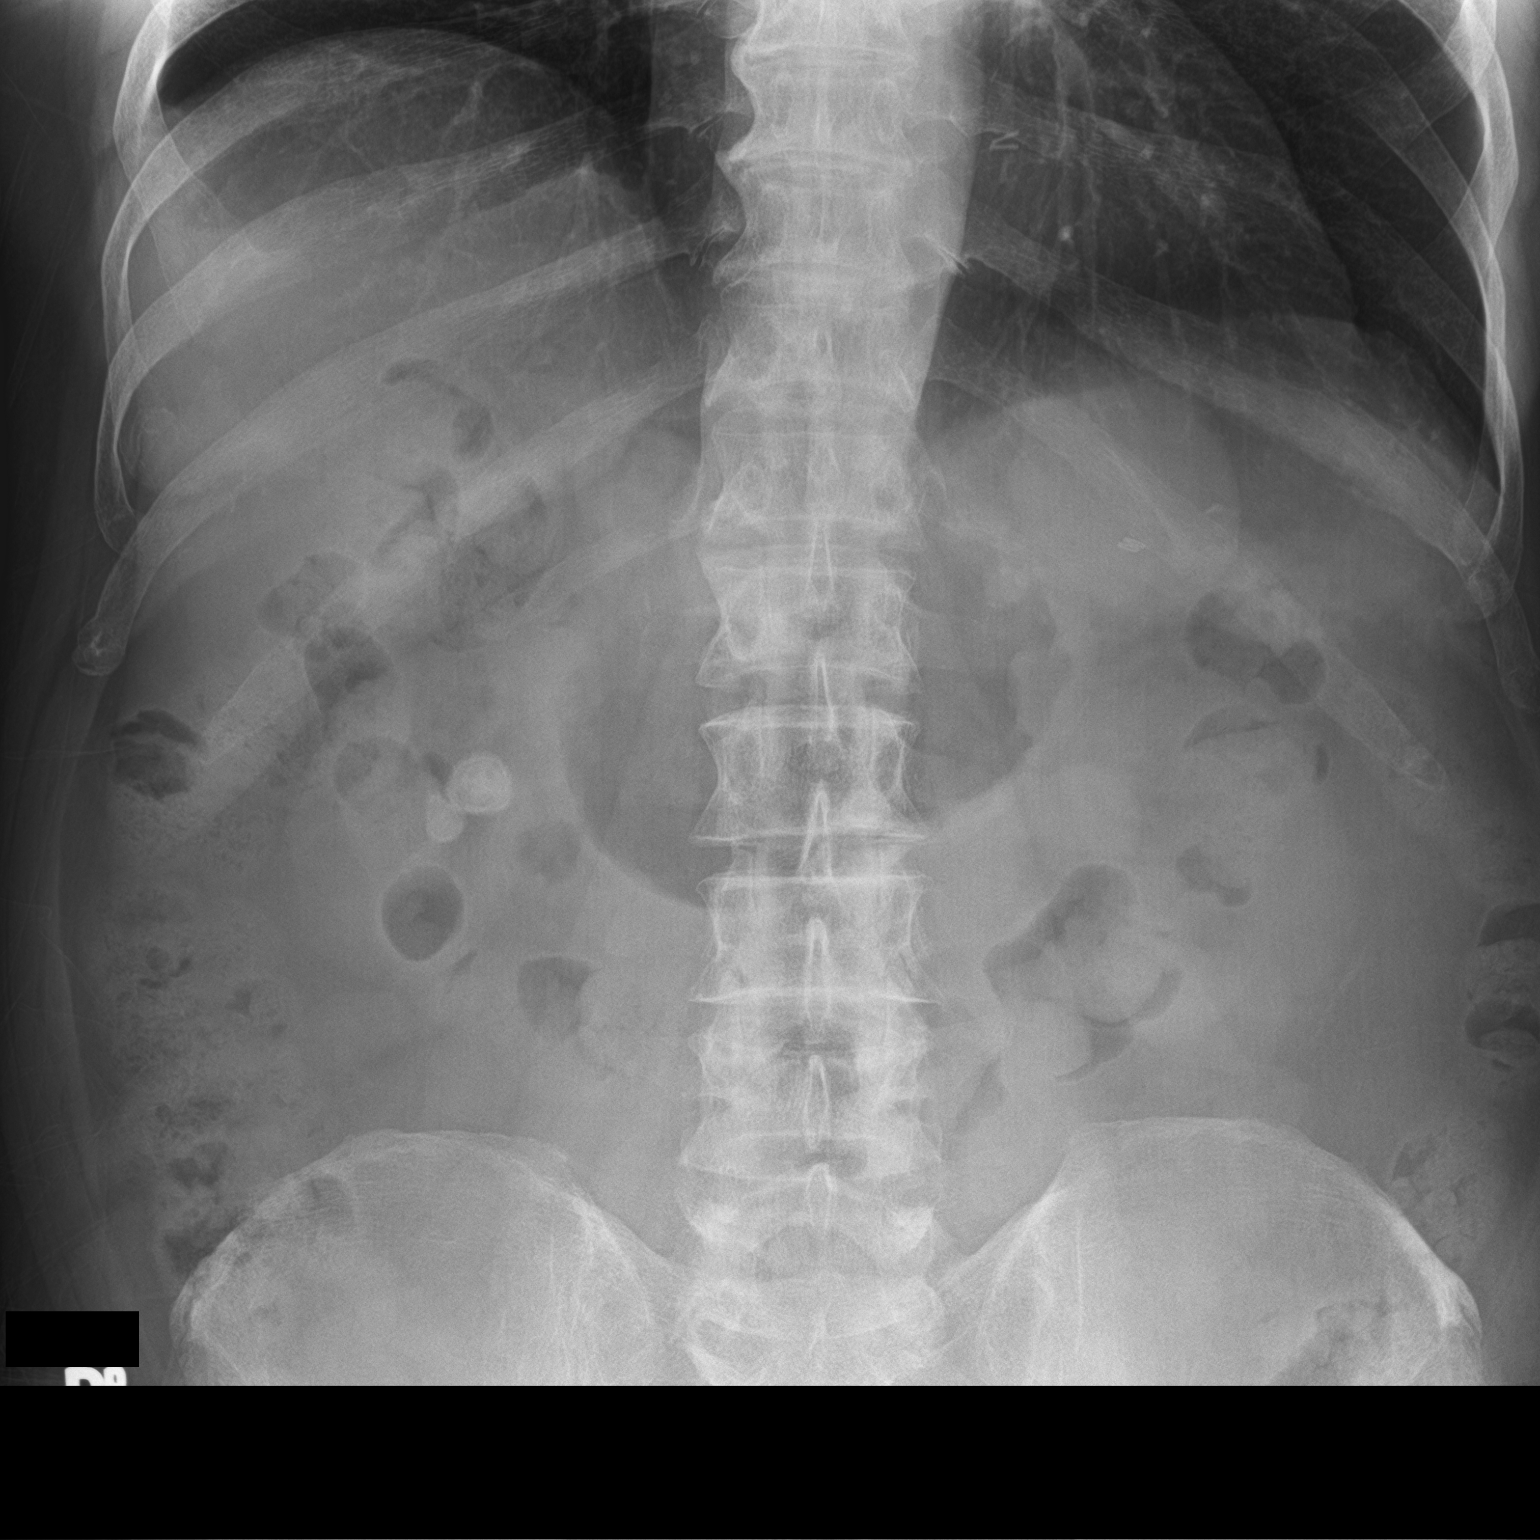
[im 2/2]
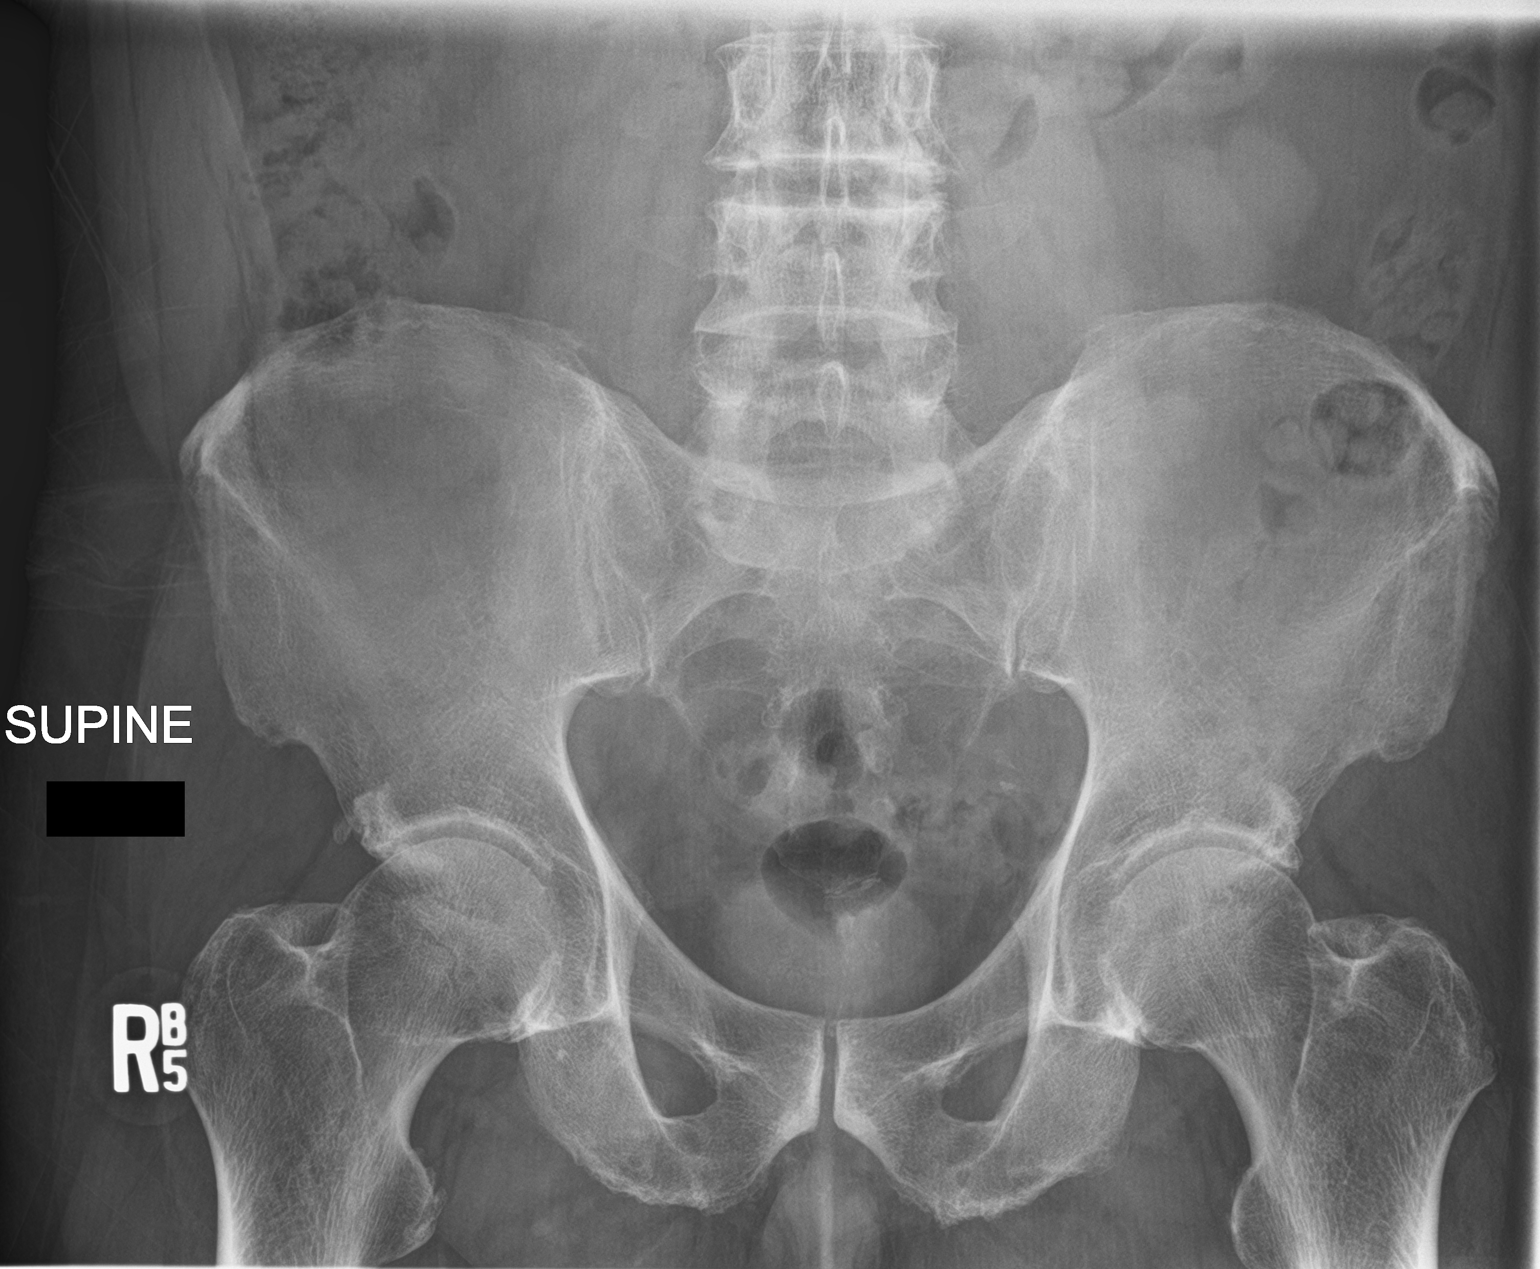

[2 of 2 positions shown; findings below may reference images not displayed]

FINDINGS: Two large lamellated stones over the central right kidney, the
larger measuring 17 mm. Possible small faint stones over the right
lower pole. No visible ureteral calculus. Left pelvic calcification
is stable from prior and likely vascular. Normal bowel gas pattern.
IMPRESSION: At least two right renal calculi that have enlarged from 2670, the
largest measuring 17 mm.

## 2019-08-03 ENCOUNTER — Telehealth: Payer: Self-pay | Admitting: Family Medicine

## 2019-08-03 ENCOUNTER — Other Ambulatory Visit: Payer: Self-pay

## 2019-08-03 DIAGNOSIS — T63441A Toxic effect of venom of bees, accidental (unintentional), initial encounter: Secondary | ICD-10-CM

## 2019-08-03 MED ORDER — EPINEPHRINE 0.3 MG/0.3ML IJ SOAJ: 0.3000 mg | INTRAMUSCULAR | 1 refills | Status: DC | PRN

## 2019-08-03 NOTE — Telephone Encounter (Signed)
Patient requesting refill on epipen.

## 2019-08-07 ENCOUNTER — Ambulatory Visit (INDEPENDENT_AMBULATORY_CARE_PROVIDER_SITE_OTHER): Payer: Medicare Other | Admitting: Family Medicine

## 2019-08-07 ENCOUNTER — Other Ambulatory Visit: Payer: Self-pay

## 2019-08-07 ENCOUNTER — Encounter: Payer: Self-pay | Admitting: Family Medicine

## 2019-08-07 VITALS — BP 138/80 | HR 68 | Ht 67.0 in | Wt 201.0 lb

## 2019-08-07 DIAGNOSIS — Z23 Encounter for immunization: Secondary | ICD-10-CM

## 2019-08-07 DIAGNOSIS — R69 Illness, unspecified: Secondary | ICD-10-CM

## 2019-08-07 DIAGNOSIS — E782 Mixed hyperlipidemia: Secondary | ICD-10-CM

## 2019-08-07 DIAGNOSIS — K219 Gastro-esophageal reflux disease without esophagitis: Secondary | ICD-10-CM

## 2019-08-07 DIAGNOSIS — I10 Essential (primary) hypertension: Secondary | ICD-10-CM

## 2019-08-07 DIAGNOSIS — K2270 Barrett's esophagus without dysplasia: Secondary | ICD-10-CM

## 2019-08-07 MED ORDER — BENAZEPRIL-HYDROCHLOROTHIAZIDE 10-12.5 MG PO TABS: 1.0000 | ORAL_TABLET | Freq: Every day | ORAL | 1 refills | Status: DC

## 2019-08-07 MED ORDER — ATORVASTATIN CALCIUM 80 MG PO TABS: 80.0000 mg | ORAL_TABLET | Freq: Every day | ORAL | 1 refills | Status: DC

## 2019-08-07 MED ORDER — METOPROLOL SUCCINATE ER 25 MG PO TB24: 25.0000 mg | ORAL_TABLET | Freq: Every day | ORAL | 1 refills | Status: DC

## 2019-08-07 MED ORDER — PANTOPRAZOLE SODIUM 40 MG PO TBEC: 40.0000 mg | DELAYED_RELEASE_TABLET | Freq: Every day | ORAL | 1 refills | Status: DC

## 2019-08-07 NOTE — Progress Notes (Signed)
Date:  08/07/2019   Name:  Timothy Hatfield   DOB:  30-Apr-1946   MRN:  [id number]   Chief Complaint: Hypertension, Hyperlipidemia, Gastroesophageal Reflux, bee stings (uses epi pen as needed- doesn't need it filled), Arthritis, and influenza vacc need  Hypertension This is a chronic problem. The current episode started more than 1 year ago. The problem has been waxing and waning since onset. The problem is controlled. Pertinent negatives include no anxiety, blurred vision, chest pain, headaches, malaise/fatigue, neck pain, orthopnea, palpitations, peripheral edema, PND, shortness of breath or sweats. There are no associated agents to hypertension. Risk factors for coronary artery disease include dyslipidemia and male gender. Past treatments include ACE inhibitors, diuretics and beta blockers. The current treatment provides mild improvement. There are no compliance problems.  There is no history of angina, kidney disease, CAD/MI, CVA, heart failure, left ventricular hypertrophy, PVD or retinopathy. There is no history of chronic renal disease, a hypertension causing med or renovascular disease.  Hyperlipidemia The current episode started more than 1 year ago. The problem is controlled. Recent lipid tests were reviewed and are normal. Exacerbating diseases include obesity. He has no history of chronic renal disease, diabetes, hypothyroidism, liver disease or nephrotic syndrome. Factors aggravating his hyperlipidemia include beta blockers and thiazides. Pertinent negatives include no chest pain, focal sensory loss, focal weakness, leg pain, myalgias or shortness of breath. Current antihyperlipidemic treatment includes statins. The current treatment provides moderate improvement of lipids. There are no compliance problems.  Risk factors for coronary artery disease include hypertension and dyslipidemia.  Gastroesophageal Reflux He reports no abdominal pain, no belching, no chest pain, no choking, no  coughing, no dysphagia, no early satiety, no globus sensation, no heartburn, no hoarse voice, no nausea, no sore throat, no stridor, no tooth decay, no water brash or no wheezing. This is a chronic problem. The current episode started more than 1 year ago. The problem occurs frequently. The problem has been gradually improving. The symptoms are aggravated by certain foods. Pertinent negatives include no anemia, fatigue, melena, muscle weakness, orthopnea or weight loss. He has tried a PPI for the symptoms. The treatment provided moderate relief.  Arthritis Presents for follow-up visit. He complains of pain and stiffness. The symptoms have been stable. Affected location: lumbar. Pertinent negatives include no diarrhea, dry eyes, dry mouth, dysuria, fatigue, fever, pain at night, pain while resting, rash, Raynaud's syndrome, uveitis or weight loss.    Review of Systems  Constitutional: Negative for chills, fatigue, fever, malaise/fatigue and weight loss.  HENT: Negative for drooling, ear discharge, ear pain, hoarse voice, postnasal drip and sore throat.   Eyes: Negative for blurred vision.  Respiratory: Negative for cough, choking, shortness of breath and wheezing.   Cardiovascular: Negative for chest pain, palpitations, orthopnea, leg swelling and PND.  Gastrointestinal: Negative for abdominal pain, blood in stool, constipation, diarrhea, dysphagia, heartburn, melena and nausea.  Endocrine: Negative for polydipsia.  Genitourinary: Negative for dysuria, frequency, hematuria and urgency.  Musculoskeletal: Positive for arthritis and stiffness. Negative for back pain, myalgias, muscle weakness and neck pain.  Skin: Negative for rash.  Allergic/Immunologic: Negative for environmental allergies.  Neurological: Negative for dizziness, focal weakness and headaches.  Hematological: Does not bruise/bleed easily.  Psychiatric/Behavioral: Negative for suicidal ideas. The patient is not nervous/anxious.      Patient Active Problem List   Diagnosis Date Noted  . GERD without esophagitis 03/25/2018  . Moderate episode of recurrent major depressive disorder ([place]) 03/25/2018  . Kidney stones  03/15/2018  . Acute calcific periarthritis 06/18/2015  . Allergic rhinitis 06/18/2015  . Arthritis 06/18/2015  . 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 Essential (primary) hypertension 06/18/2015  . 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 HLD (hyperlipidemia) 06/18/2015  . External hemorrhoid 06/18/2015  . Gastroenteritis 06/18/2015  . Calculus of kidney 05/15/2014    Allergies  Allergen Reactions  . Bee Venom Anaphylaxis  . Ciprofloxacin Itching    Past Surgical History:  Procedure Laterality Date  . CARDIAC CATHETERIZATION Left 09/09/2016   Procedure: Left Heart Cath and Coronary Angiography;  Surgeon: Yolonda Kida, MD;  Location: [facility];  Service: Cardiovascular;  Laterality: Left;  . COLONOSCOPY  2013   normal/ Dr Vira Agar- cleared for 10 yrs  . COLONOSCOPY WITH PROPOFOL N/A 01/03/2018   Procedure: COLONOSCOPY WITH PROPOFOL;  Surgeon: Manya Silvas, MD;  Location: [facility] ENDOSCOPY;  Service: Endoscopy;  Laterality: N/A;  . CORONARY ARTERY BYPASS GRAFT    . ESOPHAGOGASTRODUODENOSCOPY (EGD) WITH PROPOFOL N/A 01/03/2018   Procedure: ESOPHAGOGASTRODUODENOSCOPY (EGD) WITH PROPOFOL;  Surgeon: Manya Silvas, MD;  Location: [facility] ENDOSCOPY;  Service: Endoscopy;  Laterality: N/A;  . EYE SURGERY    . HERNIA REPAIR    . IR NEPHROSTOMY PLACEMENT RIGHT  03/15/2018  . kidney stones     lithotripsy  . LITHOTRIPSY    . NEPHROLITHOTOMY Right 03/15/2018   Procedure: NEPHROLITHOTOMY PERCUTANEOUS;  Surgeon: Abbie Sons, MD;  Location: [facility] ORS;  Service: Urology;  Laterality: Right;  . UPPER GI ENDOSCOPY  2013    Social History   Tobacco Use  . Smoking status: Never Smoker  . Smokeless tobacco: Never Used  Substance Use Topics  . Alcohol use: No    Alcohol/week: 0.0 standard drinks   . Drug use: No     Medication list has been reviewed and updated.  Current Meds  Medication Sig  . acetaminophen (TYLENOL) 500 MG tablet Take 1,000 mg by mouth daily as needed for moderate pain.  Marland Kitchen aspirin EC 81 MG tablet Take 81 mg by mouth daily.  Marland Kitchen atorvastatin (LIPITOR) 80 MG tablet TAKE 1 TABLET BY MOUTH EVERY DAY  . benazepril-hydrochlorthiazide (LOTENSIN HCT) 10-12.5 MG tablet Take 1 tablet by mouth daily.  . Coenzyme Q10 (COQ-10) 100 MG CAPS Take 100 mg by mouth at bedtime.  . Dutasteride-Tamsulosin HCl (JALYN) 0.5-0.4 MG CAPS Take 1 capsule by mouth daily. Urology- [facility] (Patient taking differently: Take 1 capsule by mouth every Monday, Wednesday, and Friday. Urology- [facility])  . EPINEPHrine 0.3 mg/0.3 mL IJ SOAJ injection Inject 0.3 mLs (0.3 mg total) into the muscle as needed for anaphylaxis.  Marland Kitchen etodolac (LODINE) 400 MG tablet TAKE 1 TABLET (400 MG TOTAL) 2 (TWO) TIMES DAILY BY MOUTH. (Patient taking differently: Take 400 mg by mouth daily. )  . fluticasone (FLONASE) 50 MCG/ACT nasal spray Place 1 spray into both nostrils daily. otc  . metoprolol succinate (TOPROL-XL) 25 MG 24 hr tablet Take 1 tablet (25 mg total) by mouth daily.  . Misc Natural Products (GLUCOSAMINE CHOND DOUBLE STR PO) Take 2 tablets by mouth at bedtime.  . Multiple Vitamin (MULTIVITAMIN WITH MINERALS) TABS tablet Take 1 tablet by mouth at bedtime.  . nitroGLYCERIN (NITROSTAT) 0.4 MG SL tablet Place 1 tablet (0.4 mg total) under the tongue every 5 (five) minutes as needed for chest pain.  . nortriptyline (PAMELOR) 10 MG capsule Take 20 mg by mouth at bedtime. neuro  . pantoprazole (PROTONIX) 40 MG tablet Take 1 tablet (40 mg total)  by mouth daily.    PHQ 2/9 Scores 08/07/2019 12/29/2018 10/03/2018 09/28/2017  PHQ - 2 Score 0 0 0 0  PHQ- 9 Score 0 0 0 -    BP Readings from Last 3 Encounters:  08/07/19 138/80  01/06/19 (!) 148/80  12/29/18 138/78    Physical Exam Vitals signs and nursing note reviewed.   HENT:     Head: Normocephalic.     Right Ear: Tympanic membrane, ear canal and external ear normal.     Left Ear: Tympanic membrane, ear canal and external ear normal.     Nose: Nose normal. No congestion.     Mouth/Throat:     Mouth: Mucous membranes are moist.     Pharynx: No oropharyngeal exudate or posterior oropharyngeal erythema.  Eyes:     General: No scleral icterus.       Right eye: No discharge.        Left eye: No discharge.     Conjunctiva/sclera: Conjunctivae normal.     Pupils: Pupils are equal, round, and reactive to light.  Neck:     Musculoskeletal: Normal range of motion and neck supple.     Thyroid: No thyromegaly.     Vascular: No carotid bruit or JVD.     Trachea: No tracheal deviation.  Cardiovascular:     Rate and Rhythm: Normal rate and regular rhythm.     Pulses: Normal pulses.     Heart sounds: Normal heart sounds. No murmur. No friction rub. No gallop.   Pulmonary:     Effort: No respiratory distress.     Breath sounds: Normal breath sounds. No wheezing, rhonchi or rales.  Abdominal:     General: Bowel sounds are normal. There is no distension.     Palpations: Abdomen is soft. There is no mass.     Tenderness: There is no abdominal tenderness. There is no right CVA tenderness, left CVA tenderness, guarding or rebound.     Hernia: No hernia is present.  Musculoskeletal: Normal range of motion.        General: No tenderness.  Lymphadenopathy:     Cervical: No cervical adenopathy.  Skin:    General: Skin is warm.     Capillary Refill: Capillary refill takes less than 2 seconds.     Coloration: Skin is not pale.     Findings: No rash.  Neurological:     Mental Status: He is alert and oriented to person, place, and time.     Cranial Nerves: No cranial nerve deficit.     Deep Tendon Reflexes: Reflexes are normal and symmetric.     Wt Readings from Last 3 Encounters:  08/07/19 201 lb (91.2 kg)  01/06/19 200 lb (90.7 kg)  12/29/18 200 lb (90.7 kg)     BP 138/80   Pulse 68   Ht 5\' 7"  (1.702 m)   Wt 201 lb (91.2 kg)   BMI 31.48 kg/m   Assessment and Plan: 1. Essential (primary) hypertension Chronic.  Controlled.  Continue benazepril hydrochlorothiazide 10-12 0.5 and metoprolol XL 25 mg once a day.  Will check CMP. - Comprehensive Metabolic Panel (CMET) - benazepril-hydrochlorthiazide (LOTENSIN HCT) 10-12.5 MG tablet; Take 1 tablet by mouth daily.  Dispense: 90 tablet; Refill: 1 - metoprolol succinate (TOPROL-XL) 25 MG 24 hr tablet; Take 1 tablet (25 mg total) by mouth daily.  Dispense: 90 tablet; Refill: 1  2. Mixed hyperlipidemia Chronic.  Controlled.  Continue atorvastatin 80 mg once a day.  Will check lipid  panel. - Lipid Panel With LDL/HDL Ratio - Comprehensive Metabolic Panel (CMET) - atorvastatin (LIPITOR) 80 MG tablet; Take 1 tablet (80 mg total) by mouth daily.  Dispense: 90 tablet; Refill: 1  3. Taking medication for chronic disease Chronic.  Controlled.  Patient on medications to check platelets/hemoglobin 1 - Comprehensive Metabolic Panel (CMET) - Hemoglobin  4. GERD without esophagitis Chronic.  Controlled.  Continue pantoprazole 40 mg once a day. - pantoprazole (PROTONIX) 40 MG tablet; Take 1 tablet (40 mg total) by mouth daily.  Dispense: 90 tablet; Refill: 1  5. Barrett's esophagus without dysplasia Patient with history of Barrett's esophagitis without dysplasia.  This is followed by gastroenterology.  Will continue Protonix 40 mg once a day. - pantoprazole (PROTONIX) 40 MG tablet; Take 1 tablet (40 mg total) by mouth daily.  Dispense: 90 tablet; Refill: 1  6. Influenza vaccine needed Discussed and administered. - Flu Vaccine QUAD High Dose(Fluad)

## 2019-08-08 LAB — COMPREHENSIVE METABOLIC PANEL
ALT: 22 IU/L (ref 0–44)
AST: 25 IU/L (ref 0–40)
Albumin/Globulin Ratio: 1.5 (ref 1.2–2.2)
Albumin: 4 g/dL (ref 3.7–4.7)
Alkaline Phosphatase: 65 IU/L (ref 39–117)
BUN/Creatinine Ratio: 11 (ref 10–24)
BUN: 13 mg/dL (ref 8–27)
Bilirubin Total: 0.8 mg/dL (ref 0.0–1.2)
CO2: 25 mmol/L (ref 20–29)
Calcium: 9.1 mg/dL (ref 8.6–10.2)
Chloride: 103 mmol/L (ref 96–106)
Creatinine, Ser: 1.23 mg/dL (ref 0.76–1.27)
GFR calc Af Amer: 67 mL/min/{1.73_m2} (ref >59)
GFR calc non Af Amer: 58 mL/min/{1.73_m2} — ABNORMAL LOW (ref >59)
Globulin, Total: 2.6 g/dL (ref 1.5–4.5)
Glucose: 122 mg/dL — ABNORMAL HIGH (ref 65–99)
Potassium: 4.5 mmol/L (ref 3.5–5.2)
Sodium: 143 mmol/L (ref 134–144)
Total Protein: 6.6 g/dL (ref 6.0–8.5)

## 2019-08-08 LAB — HEMOGLOBIN: Hemoglobin: 14.6 g/dL (ref 13.0–17.7)

## 2019-08-08 LAB — LIPID PANEL WITH LDL/HDL RATIO
Cholesterol, Total: 122 mg/dL (ref 100–199)
HDL: 52 mg/dL (ref >39)
LDL Chol Calc (NIH): 49 mg/dL (ref 0–99)
LDL/HDL Ratio: 0.9 ratio (ref 0.0–3.6)
Triglycerides: 115 mg/dL (ref 0–149)
VLDL Cholesterol Cal: 21 mg/dL (ref 5–40)

## 2019-09-27 ENCOUNTER — Other Ambulatory Visit: Payer: Self-pay

## 2019-09-27 DIAGNOSIS — Z87898 Personal history of other specified conditions: Secondary | ICD-10-CM

## 2019-09-27 MED ORDER — DUTASTERIDE-TAMSULOSIN HCL 0.5-0.4 MG PO CAPS: 1.0000 | ORAL_CAPSULE | ORAL | 11 refills | Status: DC

## 2019-10-03 ENCOUNTER — Ambulatory Visit: Payer: Medicare Other | Admitting: Family Medicine

## 2019-10-03 ENCOUNTER — Other Ambulatory Visit: Payer: Self-pay

## 2019-10-03 ENCOUNTER — Encounter: Payer: Self-pay | Admitting: Family Medicine

## 2019-10-03 VITALS — BP 130/62 | HR 64 | Ht 67.0 in | Wt 202.0 lb

## 2019-10-03 DIAGNOSIS — M5136 Other intervertebral disc degeneration, lumbar region: Secondary | ICD-10-CM | POA: Diagnosis not present

## 2019-10-03 DIAGNOSIS — G4733 Obstructive sleep apnea (adult) (pediatric): Secondary | ICD-10-CM

## 2019-10-03 NOTE — Progress Notes (Signed)
Date:  10/03/2019   Name:  Timothy Hatfield   DOB:  01/12/1946   MRN:  [id number]   Chief Complaint: face to face (sleep apnea/ cpap use- pt using machine every night and is benefitting from use of cpap. pt has stopped snoring and has decreased daytime somnulence and dozing. use of machine is 100% of the time, well over the required 70 %. )  Patient is a 73 year old male who presents for a face-to-face evaluation of sleep apnea and c pap exam. The patient reports the following problems: ? Increase pressure.Marland Kitchen Health maintenance has been reviewed up to date.  Back Pain This is a chronic problem. The current episode started more than 1 year ago. The problem has been waxing and waning since onset. The pain is present in the lumbar spine. The quality of the pain is described as aching. The symptoms are aggravated by bending and twisting. Pertinent negatives include no abdominal pain, chest pain, dysuria, fever or headaches. He has tried NSAIDs for the symptoms.    Lab Results  Component Value Date   CREATININE 1.23 08/07/2019   BUN 13 08/07/2019   NA 143 08/07/2019   K 4.5 08/07/2019   CL 103 08/07/2019   CO2 25 08/07/2019   Lab Results  Component Value Date   CHOL 122 08/07/2019   HDL 52 08/07/2019   LDLCALC 49 08/07/2019   TRIG 115 08/07/2019   CHOLHDL 2.6 10/03/2018   No results found for: TSH No results found for: HGBA1C   Review of Systems  Constitutional: Negative for chills and fever.  HENT: Negative for drooling, ear discharge, ear pain and sore throat.   Respiratory: Negative for cough, shortness of breath and wheezing.   Cardiovascular: Negative for chest pain, palpitations and leg swelling.  Gastrointestinal: Negative for abdominal pain, blood in stool, constipation, diarrhea and nausea.  Endocrine: Negative for polydipsia.  Genitourinary: Negative for dysuria, frequency, hematuria and urgency.  Musculoskeletal: Positive for arthralgias and back pain. Negative  for myalgias and neck pain.  Skin: Negative for rash.  Allergic/Immunologic: Negative for environmental allergies.  Neurological: Negative for dizziness and headaches.  Hematological: Does not bruise/bleed easily.  Psychiatric/Behavioral: Negative for suicidal ideas. The patient is not nervous/anxious.     Patient Active Problem List   Diagnosis Date Noted  . GERD without esophagitis 03/25/2018  . Moderate episode of recurrent major depressive disorder ([place]) 03/25/2018  . Kidney stones 03/15/2018  . Acute calcific periarthritis 06/18/2015  . Allergic rhinitis 06/18/2015  . Arthritis 06/18/2015  . 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 Essential (primary) hypertension 06/18/2015  . 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 HLD (hyperlipidemia) 06/18/2015  . External hemorrhoid 06/18/2015  . Gastroenteritis 06/18/2015  . Calculus of kidney 05/15/2014    Allergies  Allergen Reactions  . Bee Venom Anaphylaxis  . Ciprofloxacin Itching    Past Surgical History:  Procedure Laterality Date  . CARDIAC CATHETERIZATION Left 09/09/2016   Procedure: Left Heart Cath and Coronary Angiography;  Surgeon: Yolonda Kida, MD;  Location: [place] [facility];  Service: Cardiovascular;  Laterality: Left;  . COLONOSCOPY  2013   normal/ Dr Vira Agar- cleared for 10 yrs  . COLONOSCOPY WITH PROPOFOL N/A 01/03/2018   Procedure: COLONOSCOPY WITH PROPOFOL;  Surgeon: Manya Silvas, MD;  Location: [facility] ENDOSCOPY;  Service: Endoscopy;  Laterality: N/A;  . CORONARY ARTERY BYPASS GRAFT    . ESOPHAGOGASTRODUODENOSCOPY (EGD) WITH PROPOFOL N/A 01/03/2018   Procedure: ESOPHAGOGASTRODUODENOSCOPY (EGD) WITH PROPOFOL;  Surgeon: Manya Silvas, MD;  Location: [facility] ENDOSCOPY;  Service: Endoscopy;  Laterality: N/A;  . EYE SURGERY    . HERNIA REPAIR    . IR NEPHROSTOMY PLACEMENT RIGHT  03/15/2018  . kidney stones     lithotripsy  . LITHOTRIPSY    . NEPHROLITHOTOMY Right 03/15/2018   Procedure: NEPHROLITHOTOMY  PERCUTANEOUS;  Surgeon: Abbie Sons, MD;  Location: [facility] ORS;  Service: Urology;  Laterality: Right;  . UPPER GI ENDOSCOPY  2013    Social History   Tobacco Use  . Smoking status: Never Smoker  . Smokeless tobacco: Never Used  Substance Use Topics  . Alcohol use: No    Alcohol/week: 0.0 standard drinks  . Drug use: No     Medication list has been reviewed and updated.  Current Meds  Medication Sig  . acetaminophen (TYLENOL) 500 MG tablet Take 1,000 mg by mouth daily as needed for moderate pain.  Marland Kitchen aspirin EC 81 MG tablet Take 81 mg by mouth daily.  Marland Kitchen atorvastatin (LIPITOR) 80 MG tablet Take 1 tablet (80 mg total) by mouth daily.  . benazepril-hydrochlorthiazide (LOTENSIN HCT) 10-12.5 MG tablet Take 1 tablet by mouth daily.  . Coenzyme Q10 (COQ-10) 100 MG CAPS Take 100 mg by mouth at bedtime.  . Dutasteride-Tamsulosin HCl (JALYN) 0.5-0.4 MG CAPS Take 1 capsule by mouth every Monday, Wednesday, and Friday. Urology- [facility]  . EPINEPHrine 0.3 mg/0.3 mL IJ SOAJ injection Inject 0.3 mLs (0.3 mg total) into the muscle as needed for anaphylaxis.  Marland Kitchen etodolac (LODINE) 400 MG tablet TAKE 1 TABLET (400 MG TOTAL) 2 (TWO) TIMES DAILY BY MOUTH. (Patient taking differently: Take 400 mg by mouth daily. )  . fluticasone (FLONASE) 50 MCG/ACT nasal spray Place 1 spray into both nostrils daily. otc  . metoprolol succinate (TOPROL-XL) 25 MG 24 hr tablet Take 1 tablet (25 mg total) by mouth daily.  . Misc Natural Products (GLUCOSAMINE CHOND DOUBLE STR PO) Take 2 tablets by mouth at bedtime.  . Multiple Vitamin (MULTIVITAMIN WITH MINERALS) TABS tablet Take 1 tablet by mouth at bedtime.  . nitroGLYCERIN (NITROSTAT) 0.4 MG SL tablet Place 1 tablet (0.4 mg total) under the tongue every 5 (five) minutes as needed for chest pain.  . nortriptyline (PAMELOR) 10 MG capsule Take 20 mg by mouth at bedtime. neuro  . pantoprazole (PROTONIX) 40 MG tablet Take 1 tablet (40 mg total) by mouth daily.    PHQ 2/9  Scores 08/07/2019 12/29/2018 10/03/2018 09/28/2017  PHQ - 2 Score 0 0 0 0  PHQ- 9 Score 0 0 0 -    BP Readings from Last 3 Encounters:  10/03/19 130/62  08/07/19 138/80  01/06/19 (!) 148/80    Physical Exam Vitals signs and nursing note reviewed.  HENT:     Head: Normocephalic.     Right Ear: Tympanic membrane, ear canal and external ear normal.     Left Ear: Tympanic membrane, ear canal and external ear normal.     Nose: Nose normal. No congestion or rhinorrhea.     Mouth/Throat:     Mouth: Mucous membranes are moist.  Eyes:     General: No scleral icterus.       Right eye: No discharge.        Left eye: No discharge.     Conjunctiva/sclera: Conjunctivae normal.     Pupils: Pupils are equal, round, and reactive to light.  Neck:     Musculoskeletal: Normal range of motion and neck supple.  Thyroid: No thyromegaly.     Vascular: No JVD.     Trachea: No tracheal deviation.  Cardiovascular:     Rate and Rhythm: Normal rate and regular rhythm.     Pulses: Normal pulses.     Heart sounds: Normal heart sounds. No murmur. No friction rub. No gallop.   Pulmonary:     Effort: No respiratory distress.     Breath sounds: Normal breath sounds. No wheezing or rales.  Abdominal:     General: Bowel sounds are normal.     Palpations: Abdomen is soft. There is no mass.     Tenderness: There is no abdominal tenderness. There is no guarding or rebound.  Musculoskeletal: Normal range of motion.        General: No tenderness.  Lymphadenopathy:     Cervical: No cervical adenopathy.  Skin:    General: Skin is warm.     Capillary Refill: Capillary refill takes less than 2 seconds.     Findings: No rash.  Neurological:     Mental Status: He is alert and oriented to person, place, and time.     Cranial Nerves: No cranial nerve deficit.     Deep Tendon Reflexes: Reflexes are normal and symmetric.     Wt Readings from Last 3 Encounters:  10/03/19 202 lb (91.6 kg)  08/07/19 201 lb (91.2  kg)  01/06/19 200 lb (90.7 kg)    BP 130/62   Pulse 64   Ht 5\' 7"  (1.702 m)   Wt 202 lb (91.6 kg)   BMI 31.64 kg/m   Assessment and Plan:  1. Obstructive sleep apnea hypopnea, moderate Patient with face-to-face evaluation for review of his obstructive sleep apnea and concerns about CPAP adjustments.  Patient uses his CPAP on a nightly basis and benefits from its use compliance is justified and that he uses it greater than 70% of the time almost every day of the month.  Patient has noted improvement in terms of his daytime somnolence, generalized fatigue, and episodes of snoring and abrupt apneic awakening.  Patient does relate that there is a leakage of air which may suggest that there may need to be a mask adjustment or decrease in a setting and I will put this in the note for documentation.  2. DDD (degenerative disc disease), lumbar Patient has been evaluated at [facility] for lower back pain and was told that the there is no significant disease which I am not sure what that means other than maybe not a surgical circumstance because on valuation of 2013 there is arthritis noted with some narrowing of disc space as well as spondylolisthesis.  Patient has been informed to continue NSAIDs on an as-needed basis.

## 2019-10-29 ENCOUNTER — Other Ambulatory Visit: Payer: Self-pay | Admitting: Family Medicine

## 2019-10-29 DIAGNOSIS — E782 Mixed hyperlipidemia: Secondary | ICD-10-CM

## 2019-10-29 DIAGNOSIS — I10 Essential (primary) hypertension: Secondary | ICD-10-CM

## 2019-11-13 ENCOUNTER — Other Ambulatory Visit: Payer: Self-pay | Admitting: Family Medicine

## 2019-11-13 DIAGNOSIS — K2270 Barrett's esophagus without dysplasia: Secondary | ICD-10-CM

## 2019-11-13 DIAGNOSIS — K219 Gastro-esophageal reflux disease without esophagitis: Secondary | ICD-10-CM

## 2019-11-29 DIAGNOSIS — G4733 Obstructive sleep apnea (adult) (pediatric): Secondary | ICD-10-CM | POA: Diagnosis not present

## 2019-12-06 DIAGNOSIS — G4733 Obstructive sleep apnea (adult) (pediatric): Secondary | ICD-10-CM | POA: Diagnosis not present

## 2019-12-09 ENCOUNTER — Other Ambulatory Visit: Payer: Self-pay | Admitting: Family Medicine

## 2019-12-09 DIAGNOSIS — I10 Essential (primary) hypertension: Secondary | ICD-10-CM

## 2019-12-22 ENCOUNTER — Other Ambulatory Visit: Payer: Self-pay

## 2019-12-22 MED ORDER — ETODOLAC 400 MG PO TABS: ORAL_TABLET | ORAL | 1 refills | Status: DC

## 2019-12-30 DIAGNOSIS — G4733 Obstructive sleep apnea (adult) (pediatric): Secondary | ICD-10-CM | POA: Diagnosis not present

## 2020-01-08 ENCOUNTER — Ambulatory Visit
Admission: RE | Admit: 2020-01-08 | Discharge: 2020-01-08 | Disposition: A | Discharge status: Home / Self Care | Payer: Medicare PPO | Attending: Urology | Admitting: Urology

## 2020-01-08 ENCOUNTER — Other Ambulatory Visit: Payer: Self-pay

## 2020-01-08 ENCOUNTER — Ambulatory Visit
Admission: RE | Admit: 2020-01-08 | Discharge: 2020-01-08 | Disposition: A | Discharge status: Home / Self Care | Payer: Medicare PPO | Source: Ambulatory Visit | Attending: Urology | Admitting: Urology

## 2020-01-08 ENCOUNTER — Ambulatory Visit: Payer: Medicare Other | Admitting: Urology

## 2020-01-08 DIAGNOSIS — N200 Calculus of kidney: Secondary | ICD-10-CM | POA: Diagnosis not present

## 2020-01-10 ENCOUNTER — Other Ambulatory Visit: Payer: Self-pay

## 2020-01-10 ENCOUNTER — Encounter: Payer: Self-pay | Admitting: Urology

## 2020-01-10 ENCOUNTER — Ambulatory Visit: Payer: Medicare PPO | Admitting: Urology

## 2020-01-10 VITALS — BP 167/93 | HR 80 | Ht 67.0 in | Wt 195.0 lb

## 2020-01-10 DIAGNOSIS — N200 Calculus of kidney: Secondary | ICD-10-CM | POA: Diagnosis not present

## 2020-01-10 DIAGNOSIS — N401 Enlarged prostate with lower urinary tract symptoms: Secondary | ICD-10-CM | POA: Diagnosis not present

## 2020-01-10 NOTE — Progress Notes (Signed)
01/10/2020 2:19 PM   Timothy Hatfield 13-Jan-1946 [id number]  Referring provider: Juline Patch, MD [address] [place],  [place] [address]  Chief Complaint  Patient presents with  . Nephrolithiasis    follow up    Urologic history: 1.  Nephrolithiasis             - Right PCNL 02/2018 for two 14 and 17 mm renal pelvic calculi             - Prior history of shockwave lithotripsy             - Small bilateral renal calculi on prior CT  2.  History elevated PSA             - Prostate biopsy October 2010 PSA 4.9; benign pathology   HPI: 73y.o. male presents for annual follow-up.  He called in June complaining of right flank pain.  KUB showed bilateral nephrolithiasis.  Because of continued pain a CT was performed July 2020 which showed small, bilateral nonobstructing renal calculi and a 4 mm calculus in the bladder.  He presently denies flank or abdominal pain.  He has no bothersome lower urinary tract symptoms and remains on tamsulosin/dutasteride.   PMH: Past Medical History:  Diagnosis Date  . Allergy   . BPH (benign prostatic hyperplasia)   . Coronary artery disease   . GERD (gastroesophageal reflux disease)   . History of kidney stones   . Hyperlipidemia   . Hypertension   . Pneumonia   . Renal disorder    kidney stones  . Sleep apnea    CPAP    Surgical History: Past Surgical History:  Procedure Laterality Date  . CARDIAC CATHETERIZATION Left 09/09/2016   Procedure: Left Heart Cath and Coronary Angiography;  Surgeon: Yolonda Kida, MD;  Location: [facility];  Service: Cardiovascular;  Laterality: Left;  . COLONOSCOPY  2013   normal/ Dr Vira Agar- cleared for 10 yrs  . COLONOSCOPY WITH PROPOFOL N/A 01/03/2018   Procedure: COLONOSCOPY WITH PROPOFOL;  Surgeon: Manya Silvas, MD;  [facility] ENDOSCOPY;  Service: Endoscopy;  Laterality: N/A;  . CORONARY ARTERY BYPASS GRAFT    . ESOPHAGOGASTRODUODENOSCOPY (EGD) WITH PROPOFOL N/A  01/03/2018   Procedure: ESOPHAGOGASTRODUODENOSCOPY (EGD) WITH PROPOFOL;  Surgeon: Manya Silvas, MD;  Location: [facility] ENDOSCOPY;  Service: Endoscopy;  Laterality: N/A;  . EYE SURGERY    . HERNIA REPAIR    . IR NEPHROSTOMY PLACEMENT RIGHT  03/15/2018  . kidney stones     lithotripsy  . LITHOTRIPSY    . NEPHROLITHOTOMY Right 03/15/2018   Procedure: NEPHROLITHOTOMY PERCUTANEOUS;  Surgeon: Abbie Sons, MD;  Location: [facility] ORS;  Service: Urology;  Laterality: Right;  . UPPER GI ENDOSCOPY  2013    Home Medications:  Allergies as of 01/10/2020      Reactions   Bee Venom Anaphylaxis   Ciprofloxacin Itching      Medication List       Accurate as of January 10, 2020  2:19 PM. If you have any questions, ask your nurse or doctor.        acetaminophen 500 MG tablet Commonly known as: TYLENOL Take 1,000 mg by mouth daily as needed for moderate pain.   aspirin EC 81 MG tablet Take 81 mg by mouth daily.   atorvastatin 80 MG tablet Commonly known as: LIPITOR TAKE 1 TABLET BY MOUTH EVERY DAY   benazepril-hydrochlorthiazide 10-12.5 MG tablet Commonly known as: LOTENSIN HCT TAKE  1 TABLET BY MOUTH EVERY DAY   CoQ-10 100 MG Caps Take 100 mg by mouth at bedtime.   Dutasteride-Tamsulosin HCl 0.5-0.4 MG Caps Commonly known as: Jalyn Take 1 capsule by mouth every Monday, Wednesday, and Friday. Urology- [place]   EPINEPHrine 0.3 mg/0.3 mL Soaj injection Commonly known as: EPI-PEN Inject 0.3 mLs (0.3 mg total) into the muscle as needed for anaphylaxis.   etodolac 400 MG tablet Commonly known as: LODINE TAKE 1 TABLET (400 MG TOTAL) 2 (TWO) TIMES DAILY BY MOUTH.   fluticasone 50 MCG/ACT nasal spray Commonly known as: FLONASE Place 1 spray into both nostrils daily. otc   GLUCOSAMINE CHOND DOUBLE STR PO Take 2 tablets by mouth at bedtime.   metoprolol succinate 25 MG 24 hr tablet Commonly known as: TOPROL-XL TAKE 1 TABLET BY MOUTH ONCE DAILY   multivitamin with minerals Tabs  tablet Take 1 tablet by mouth at bedtime.   nitroGLYCERIN 0.4 MG SL tablet Commonly known as: NITROSTAT Place 1 tablet (0.4 mg total) under the tongue every 5 (five) minutes as needed for chest pain.   nortriptyline 10 MG capsule Commonly known as: PAMELOR Take 20 mg by mouth at bedtime. neuro   pantoprazole 40 MG tablet Commonly known as: PROTONIX TAKE 1 TABLET BY MOUTH EVERY DAY       Allergies:  Allergies  Allergen Reactions  . Bee Venom Anaphylaxis  . Ciprofloxacin Itching    Family History: Family History  Problem Relation Age of Onset  . Arrhythmia Mother   . Coronary artery disease Father   . Diabetes Father   . Hyperlipidemia Brother     Social History:  reports that he has never smoked. He has never used smokeless tobacco. He reports that he does not drink alcohol or use drugs.   Physical Exam: BP (!) 167/93 (BP Location: Left Arm, Patient Position: Sitting, Cuff Size: Large)   Pulse 80   Ht 5\' 7"  (1.702 m)   Wt 195 lb (88.5 kg)   BMI 30.54 kg/m   Constitutional:  Alert and oriented, No acute distress. HEENT: [place] AT, moist mucus membranes.  Trachea midline, no masses. Cardiovascular: No clubbing, cyanosis, or edema. Respiratory: Normal respiratory effort, no increased work of breathing. GI: Abdomen is soft, nontender, nondistended, no abdominal masses GU: No CVA tenderness Lymph: No cervical or inguinal lymphadenopathy. Skin: No rashes, bruises or suspicious lesions. Neurologic: Grossly intact, no focal deficits, moving all 4 extremities. Psychiatric: Normal mood and affect.   Pertinent Imaging: Prior CT and KUB performed today were personally reviewed  Results for orders placed during the hospital encounter of 05/24/19  CT RENAL STONE STUDY   Narrative CLINICAL DATA:  Renal colic for 3 weeks. Microscopic hematuria. Nephrolithiasis.  EXAM: CT ABDOMEN AND PELVIS WITHOUT CONTRAST  TECHNIQUE: Multidetector CT imaging of the abdomen and pelvis  was performed following the standard protocol without IV contrast.  COMPARISON:  03/10/2018  FINDINGS: Lower chest: No acute findings.  Hepatobiliary: No mass visualized on this unenhanced exam. Probable tiny gallstones again noted, however there is no evidence of cholecystitis or biliary dilatation.  Pancreas: No mass or inflammatory process visualized on this unenhanced exam.  Spleen:  Within normal limits in size.  Adrenals/Urinary tract: A few bilateral fluid attenuation renal cysts are again seen. Several small less than 1 cm calculi are seen in both kidneys. No evidence of ureteral calculi or hydronephrosis. A 4 mm calculus is seen in the urinary bladder, which may represent a recently passed ureteral calculus. Mild  diffuse bladder wall thickening is again seen, most likely due to chronic bladder outlet obstruction given enlarged prostate.  Stomach/Bowel: No evidence of obstruction, inflammatory process, or abnormal fluid collections. Normal appendix visualized. Diverticulosis is seen mainly involving the sigmoid colon, however there is no evidence of diverticulitis.  Vascular/Lymphatic: No pathologically enlarged lymph nodes identified. No evidence of abdominal aortic aneurysm. Aortic atherosclerosis.  Reproductive:  Stable mildly enlarged prostate.  Other:  None.  Musculoskeletal:  No suspicious bone lesions identified.  IMPRESSION: 1. 4 mm calculus in urinary bladder, which may represent a recently passed ureteral calculus. 2. Bilateral nephrolithiasis. No evidence of ureteral calculi or hydronephrosis. 3. Stable mildly enlarged prostate and findings of chronic bladder outlet obstruction. 4. Colonic diverticulosis. No radiographic evidence of diverticulitis. 5. Probable tiny gallstones. No radiographic evidence of cholecystitis.  Aortic Atherosclerosis (ICD10-I70.0).   Electronically Signed   By: Marlaine Hind M.D.   On: 05/24/2019 17:03      Assessment & Plan:    - Bilateral nephrolithiasis Stable, nonobstructing.  Follow-up KUB 1 year.  Call earlier for recurrent colic/flank pain  - BPH with lower urinary tract symptoms Stable on combination therapy.  He did not need a refill  - Prostate cancer screening We did discuss current screening recommendations of PSA/DRE between the ages of 22-69.  He did request a PSA today.    Abbie Sons, [place] [address], [place], [place] [address] [phone]

## 2020-01-12 ENCOUNTER — Encounter: Payer: Self-pay | Admitting: Urology

## 2020-01-12 DIAGNOSIS — N401 Enlarged prostate with lower urinary tract symptoms: Secondary | ICD-10-CM | POA: Insufficient documentation

## 2020-01-23 ENCOUNTER — Encounter: Payer: Self-pay | Admitting: Family Medicine

## 2020-01-23 ENCOUNTER — Other Ambulatory Visit: Payer: Self-pay

## 2020-01-23 ENCOUNTER — Ambulatory Visit: Payer: Medicare PPO | Admitting: Family Medicine

## 2020-01-23 VITALS — BP 138/62 | HR 80 | Ht 67.0 in | Wt 201.0 lb

## 2020-01-23 DIAGNOSIS — L739 Follicular disorder, unspecified: Secondary | ICD-10-CM | POA: Diagnosis not present

## 2020-01-23 DIAGNOSIS — J301 Allergic rhinitis due to pollen: Secondary | ICD-10-CM | POA: Diagnosis not present

## 2020-01-23 DIAGNOSIS — I10 Essential (primary) hypertension: Secondary | ICD-10-CM | POA: Diagnosis not present

## 2020-01-23 DIAGNOSIS — K219 Gastro-esophageal reflux disease without esophagitis: Secondary | ICD-10-CM

## 2020-01-23 DIAGNOSIS — M5136 Other intervertebral disc degeneration, lumbar region: Secondary | ICD-10-CM | POA: Diagnosis not present

## 2020-01-23 DIAGNOSIS — M51369 Other intervertebral disc degeneration, lumbar region without mention of lumbar back pain or lower extremity pain: Secondary | ICD-10-CM

## 2020-01-23 DIAGNOSIS — Z23 Encounter for immunization: Secondary | ICD-10-CM | POA: Diagnosis not present

## 2020-01-23 DIAGNOSIS — K2270 Barrett's esophagus without dysplasia: Secondary | ICD-10-CM | POA: Diagnosis not present

## 2020-01-23 DIAGNOSIS — E782 Mixed hyperlipidemia: Secondary | ICD-10-CM

## 2020-01-23 MED ORDER — METOPROLOL SUCCINATE ER 25 MG PO TB24: 25.0000 mg | ORAL_TABLET | Freq: Every day | ORAL | 1 refills | Status: DC

## 2020-01-23 MED ORDER — PANTOPRAZOLE SODIUM 40 MG PO TBEC: 40.0000 mg | DELAYED_RELEASE_TABLET | Freq: Every day | ORAL | 1 refills | Status: DC

## 2020-01-23 MED ORDER — ATORVASTATIN CALCIUM 80 MG PO TABS: 80.0000 mg | ORAL_TABLET | Freq: Every day | ORAL | 1 refills | Status: DC

## 2020-01-23 MED ORDER — MUPIROCIN 2 % EX OINT: 1.0000 "application " | TOPICAL_OINTMENT | Freq: Two times a day (BID) | CUTANEOUS | 0 refills | Status: DC

## 2020-01-23 MED ORDER — FLUTICASONE PROPIONATE 50 MCG/ACT NA SUSP: 1.0000 | Freq: Every day | NASAL | 11 refills | Status: DC

## 2020-01-23 MED ORDER — ETODOLAC 400 MG PO TABS: 400.0000 mg | ORAL_TABLET | Freq: Every day | ORAL | 1 refills | Status: DC

## 2020-01-23 NOTE — Patient Instructions (Signed)

## 2020-01-23 NOTE — Progress Notes (Signed)
Date:  01/23/2020   Name:  Timothy Hatfield   DOB:  04/04/1946   MRN:  [id number]   Chief Complaint: Hypertension, Hyperlipidemia, Gastroesophageal Reflux, Allergic Rhinitis , Arthritis, and pneum 13  Hypertension This is a chronic problem. The current episode started more than 1 year ago. The problem has been gradually improving since onset. The problem is controlled. Pertinent negatives include no anxiety, blurred vision, chest pain, headaches, malaise/fatigue, neck pain, orthopnea, palpitations, peripheral edema, PND, shortness of breath or sweats. There are no associated agents to hypertension. There are no known risk factors for coronary artery disease. Past treatments include ACE inhibitors, diuretics and beta blockers. The current treatment provides moderate improvement. There are no compliance problems.  There is no history of angina, kidney disease, CAD/MI, CVA, heart failure, left ventricular hypertrophy, PVD or retinopathy. There is no history of chronic renal disease, a hypertension causing med or renovascular disease.  Hyperlipidemia This is a chronic problem. The current episode started more than 1 year ago. The problem is controlled. Recent lipid tests were reviewed and are normal. He has no history of chronic renal disease, diabetes, hypothyroidism, liver disease, obesity or nephrotic syndrome. Pertinent negatives include no chest pain, focal sensory loss, focal weakness, leg pain, myalgias or shortness of breath. Current antihyperlipidemic treatment includes statins. The current treatment provides moderate improvement of lipids. There are no compliance problems.  Risk factors for coronary artery disease include dyslipidemia, hypertension and male sex.  Gastroesophageal Reflux He reports no abdominal pain, no belching, no chest pain, no choking, no coughing, no dysphagia, no early satiety, no globus sensation, no heartburn, no hoarse voice, no nausea, no sore throat, no stridor, no  tooth decay, no water brash or no wheezing. This is a chronic problem. The current episode started more than 1 year ago. The problem has been gradually worsening. The symptoms are aggravated by certain foods. Pertinent negatives include no anemia, fatigue, melena, muscle weakness, orthopnea or weight loss. There are no known risk factors. He has tried a PPI for the symptoms. The treatment provided moderate relief.  Arthritis Presents for follow-up visit. He reports no pain, stiffness, joint swelling or joint warmth. The symptoms have been improving. Pertinent negatives include no diarrhea, dry eyes, dry mouth, dysuria, fatigue, fever, pain at night, pain while resting, rash, Raynaud's syndrome, uveitis or weight loss.    Lab Results  Component Value Date   CREATININE 1.23 08/07/2019   BUN 13 08/07/2019   NA 143 08/07/2019   K 4.5 08/07/2019   CL 103 08/07/2019   CO2 25 08/07/2019   Lab Results  Component Value Date   CHOL 122 08/07/2019   HDL 52 08/07/2019   LDLCALC 49 08/07/2019   TRIG 115 08/07/2019   CHOLHDL 2.6 10/03/2018   No results found for: TSH No results found for: HGBA1C   Review of Systems  Constitutional: Negative for chills, fatigue, fever, malaise/fatigue and weight loss.  HENT: Negative for congestion, drooling, ear discharge, ear pain, hoarse voice and sore throat.   Eyes: Negative for blurred vision.  Respiratory: Negative for cough, choking, shortness of breath and wheezing.   Cardiovascular: Negative for chest pain, palpitations, orthopnea, leg swelling and PND.  Gastrointestinal: Negative for abdominal pain, blood in stool, constipation, diarrhea, dysphagia, heartburn, melena and nausea.  Endocrine: Negative for polydipsia.  Genitourinary: Negative for dysuria, frequency, hematuria and urgency.  Musculoskeletal: Positive for arthritis. Negative for back pain, joint swelling, myalgias, muscle weakness, neck pain and stiffness.  Skin:  Negative for rash.   Allergic/Immunologic: Negative for environmental allergies.  Neurological: Negative for dizziness, focal weakness and headaches.  Hematological: Does not bruise/bleed easily.  Psychiatric/Behavioral: Negative for suicidal ideas. The patient is not nervous/anxious.     Patient Active Problem List   Diagnosis Date Noted  . Benign prostatic hyperplasia with lower urinary tract symptoms 01/12/2020  . GERD without esophagitis 03/25/2018  . Moderate episode of recurrent major depressive disorder ([place]) 03/25/2018  . Acute calcific periarthritis 06/18/2015  . Allergic rhinitis 06/18/2015  . Arthritis 06/18/2015  . Essential (primary) hypertension 06/18/2015  . 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 HLD (hyperlipidemia) 06/18/2015  . External hemorrhoid 06/18/2015  . Gastroenteritis 06/18/2015  . Bilateral nephrolithiasis 05/15/2014    Allergies  Allergen Reactions  . Bee Venom Anaphylaxis  . Ciprofloxacin Itching    Past Surgical History:  Procedure Laterality Date  . CARDIAC CATHETERIZATION Left 09/09/2016   Procedure: Left Heart Cath and Coronary Angiography;  Surgeon: Yolonda Kida, MD;  Location: [place] [facility];  Service: Cardiovascular;  Laterality: Left;  . COLONOSCOPY  2013   normal/ Dr Vira Agar- cleared for 10 yrs  . COLONOSCOPY WITH PROPOFOL N/A 01/03/2018   Procedure: COLONOSCOPY WITH PROPOFOL;  Surgeon: Manya Silvas, MD;  Location: [facility] ENDOSCOPY;  Service: Endoscopy;  Laterality: N/A;  . CORONARY ARTERY BYPASS GRAFT    . ESOPHAGOGASTRODUODENOSCOPY (EGD) WITH PROPOFOL N/A 01/03/2018   Procedure: ESOPHAGOGASTRODUODENOSCOPY (EGD) WITH PROPOFOL;  Surgeon: Manya Silvas, MD;  Location: [facility] ENDOSCOPY;  Service: Endoscopy;  Laterality: N/A;  . EYE SURGERY    . HERNIA REPAIR    . IR NEPHROSTOMY PLACEMENT RIGHT  03/15/2018  . kidney stones     lithotripsy  . LITHOTRIPSY    . NEPHROLITHOTOMY Right 03/15/2018   Procedure: NEPHROLITHOTOMY PERCUTANEOUS;  Surgeon:  Abbie Sons, MD;  Location: [facility] ORS;  Service: Urology;  Laterality: Right;  . UPPER GI ENDOSCOPY  2013    Social History   Tobacco Use  . Smoking status: Never Smoker  . Smokeless tobacco: Never Used  Substance Use Topics  . Alcohol use: No    Alcohol/week: 0.0 standard drinks  . Drug use: No     Medication list has been reviewed and updated.  Current Meds  Medication Sig  . acetaminophen (TYLENOL) 500 MG tablet Take 1,000 mg by mouth daily as needed for moderate pain.  Marland Kitchen aspirin EC 81 MG tablet Take 81 mg by mouth daily.  Marland Kitchen atorvastatin (LIPITOR) 80 MG tablet TAKE 1 TABLET BY MOUTH EVERY DAY  . benazepril-hydrochlorthiazide (LOTENSIN HCT) 10-12.5 MG tablet TAKE 1 TABLET BY MOUTH EVERY DAY  . Coenzyme Q10 (COQ-10) 100 MG CAPS Take 100 mg by mouth at bedtime.  . Dutasteride-Tamsulosin HCl (JALYN) 0.5-0.4 MG CAPS Take 1 capsule by mouth every Monday, Wednesday, and Friday. Urology- Stoioff  . EPINEPHrine 0.3 mg/0.3 mL IJ SOAJ injection Inject 0.3 mLs (0.3 mg total) into the muscle as needed for anaphylaxis.  Marland Kitchen etodolac (LODINE) 400 MG tablet TAKE 1 TABLET (400 MG TOTAL) 2 (TWO) TIMES DAILY BY MOUTH. (Patient taking differently: Take 400 mg by mouth daily. TAKE 1 TABLET (400 MG TOTAL)  DAILY BY MOUTH.)  . fluticasone (FLONASE) 50 MCG/ACT nasal spray Place 1 spray into both nostrils daily. otc  . metoprolol succinate (TOPROL-XL) 25 MG 24 hr tablet TAKE 1 TABLET BY MOUTH ONCE DAILY  . Misc Natural Products (GLUCOSAMINE CHOND DOUBLE STR PO) Take 2 tablets by mouth at bedtime.  . Multiple Vitamin (MULTIVITAMIN  WITH MINERALS) TABS tablet Take 1 tablet by mouth at bedtime.  . nitroGLYCERIN (NITROSTAT) 0.4 MG SL tablet Place 1 tablet (0.4 mg total) under the tongue every 5 (five) minutes as needed for chest pain.  . pantoprazole (PROTONIX) 40 MG tablet TAKE 1 TABLET BY MOUTH EVERY DAY    PHQ 2/9 Scores 01/23/2020 08/07/2019 12/29/2018 10/03/2018  PHQ - 2 Score 0 0 0 0  PHQ- 9 Score 0 0  0 0    BP Readings from Last 3 Encounters:  01/23/20 138/62  01/10/20 (!) 167/93  10/03/19 130/62    Physical Exam Vitals and nursing note reviewed.  HENT:     Head: Normocephalic.     Right Ear: Tympanic membrane, ear canal and external ear normal. There is no impacted cerumen.     Left Ear: Tympanic membrane, ear canal and external ear normal. There is no impacted cerumen.     Nose: Nose normal. No congestion or rhinorrhea.  Eyes:     General: No scleral icterus.       Right eye: No discharge.        Left eye: No discharge.     Conjunctiva/sclera: Conjunctivae normal.     Pupils: Pupils are equal, round, and reactive to light.  Neck:     Thyroid: No thyromegaly.     Vascular: No JVD.     Trachea: No tracheal deviation.  Cardiovascular:     Rate and Rhythm: Normal rate and regular rhythm.     Heart sounds: Normal heart sounds, S1 normal and S2 normal. No murmur. No systolic murmur. No diastolic murmur. No friction rub. No gallop. No S3 or S4 sounds.   Pulmonary:     Effort: No respiratory distress.     Breath sounds: Normal breath sounds. No wheezing, rhonchi or rales.  Abdominal:     General: Bowel sounds are normal.     Palpations: Abdomen is soft. There is no mass.     Tenderness: There is no abdominal tenderness. There is no guarding or rebound.  Musculoskeletal:        General: No tenderness. Normal range of motion.     Cervical back: Normal range of motion and neck supple.     Right lower leg: No edema.     Left lower leg: No edema.  Lymphadenopathy:     Cervical: No cervical adenopathy.  Skin:    General: Skin is warm.     Findings: No bruising, erythema or rash.  Neurological:     Mental Status: He is alert and oriented to person, place, and time.     Cranial Nerves: No cranial nerve deficit.     Deep Tendon Reflexes: Reflexes are normal and symmetric.     Wt Readings from Last 3 Encounters:  01/23/20 201 lb (91.2 kg)  01/10/20 195 lb (88.5 kg)   10/03/19 202 lb (91.6 kg)    BP 138/62   Pulse 80   Ht 5\' 7"  (1.702 m)   Wt 201 lb (91.2 kg)   BMI 31.48 kg/m   Assessment and Plan:  1. GERD without esophagitis Chronic.  Controlled.  Stable.  Continue pantoprazole 40 mg once a day. - pantoprazole (PROTONIX) 40 MG tablet; Take 1 tablet (40 mg total) by mouth daily.  Dispense: 90 tablet; Refill: 1  2. Barrett's esophagus without dysplasia With history of Barrett's esophagitis and we will continue pantoprazole 40 mg once a day. - pantoprazole (PROTONIX) 40 MG tablet; Take 1 tablet (40 mg total)  by mouth daily.  Dispense: 90 tablet; Refill: 1  3. Essential (primary) hypertension Chronic.  Controlled.  Stable.  Continue metoprolol XL 25 mg once a day.  Reviewed patient's previous renal panel from September 2020 and this was in acceptable range - metoprolol succinate (TOPROL-XL) 25 MG 24 hr tablet; Take 1 tablet (25 mg total) by mouth daily.  Dispense: 90 tablet; Refill: 1  4. Mixed hyperlipidemia Chronic.  Controlled.  Stable.  Continue atorvastatin 80 mg once a day.  Reviewed patient's lipid panel from 2020 and it is well within normal limits. - atorvastatin (LIPITOR) 80 MG tablet; Take 1 tablet (80 mg total) by mouth daily.  Dispense: 90 tablet; Refill: 1  5. Folliculitis Patient has an area of folliculitis over the bridge of his nose consistent with a secondary staph infection in an area where the mass comes up to hit the bridge of his nose for his CPAP machine.  We will initiate Bactroban ointment apply to area twice a day with a Band-Aid covering at night so as not to be reirritated by the mask. - mupirocin ointment (BACTROBAN) 2 %; Apply 1 application topically 2 (two) times daily.  Dispense: 22 g; Refill: 0  6. Seasonal allergic rhinitis due to pollen Panic.  Controlled.  Episodic.  Refill fluticasone nasal spray 1 spray in each nostril daily. - fluticasone (FLONASE) 50 MCG/ACT nasal spray; Place 1 spray into both nostrils  daily. otc  Dispense: 9.9 mL; Refill: 11  7. DDD (degenerative disc disease), lumbar Chronic.  Controlled.  Stable.  Continue etodolac 400 mg once a day. - etodolac (LODINE) 400 MG tablet; Take 1 tablet (400 mg total) by mouth daily. TAKE 1 TABLET (400 MG TOTAL)  DAILY BY MOUTH.  Dispense: 90 tablet; Refill: 1

## 2020-01-23 NOTE — Addendum Note (Signed)
Addended by: Fredderick Severance on: 01/23/2020 02:49 PM   Modules accepted: Orders

## 2020-01-24 ENCOUNTER — Other Ambulatory Visit: Payer: Self-pay

## 2020-01-24 DIAGNOSIS — N401 Enlarged prostate with lower urinary tract symptoms: Secondary | ICD-10-CM

## 2020-01-25 ENCOUNTER — Other Ambulatory Visit: Payer: Self-pay

## 2020-01-25 ENCOUNTER — Other Ambulatory Visit: Payer: Medicare PPO

## 2020-01-25 DIAGNOSIS — N401 Enlarged prostate with lower urinary tract symptoms: Secondary | ICD-10-CM | POA: Diagnosis not present

## 2020-01-26 ENCOUNTER — Telehealth: Payer: Self-pay

## 2020-01-26 LAB — PSA: Prostate Specific Ag, Serum: 0.9 ng/mL (ref 0.0–4.0)

## 2020-01-26 NOTE — Telephone Encounter (Signed)
Left message on voicemail, instructed pt as advised.

## 2020-01-27 DIAGNOSIS — G4733 Obstructive sleep apnea (adult) (pediatric): Secondary | ICD-10-CM | POA: Diagnosis not present

## 2020-02-17 ENCOUNTER — Other Ambulatory Visit: Payer: Self-pay | Admitting: Family Medicine

## 2020-02-17 DIAGNOSIS — M5136 Other intervertebral disc degeneration, lumbar region: Secondary | ICD-10-CM

## 2020-02-27 DIAGNOSIS — G4733 Obstructive sleep apnea (adult) (pediatric): Secondary | ICD-10-CM | POA: Diagnosis not present

## 2020-03-18 ENCOUNTER — Other Ambulatory Visit: Payer: Self-pay | Admitting: Family Medicine

## 2020-03-18 DIAGNOSIS — E782 Mixed hyperlipidemia: Secondary | ICD-10-CM | POA: Diagnosis not present

## 2020-03-18 DIAGNOSIS — I10 Essential (primary) hypertension: Secondary | ICD-10-CM | POA: Diagnosis not present

## 2020-03-18 DIAGNOSIS — I208 Other forms of angina pectoris: Secondary | ICD-10-CM | POA: Diagnosis not present

## 2020-03-18 DIAGNOSIS — K219 Gastro-esophageal reflux disease without esophagitis: Secondary | ICD-10-CM | POA: Diagnosis not present

## 2020-03-18 DIAGNOSIS — I25110 Atherosclerotic heart disease of native coronary artery with unstable angina pectoris: Secondary | ICD-10-CM | POA: Diagnosis not present

## 2020-03-18 DIAGNOSIS — G4733 Obstructive sleep apnea (adult) (pediatric): Secondary | ICD-10-CM | POA: Diagnosis not present

## 2020-03-18 DIAGNOSIS — Z9989 Dependence on other enabling machines and devices: Secondary | ICD-10-CM | POA: Diagnosis not present

## 2020-03-18 DIAGNOSIS — M5136 Other intervertebral disc degeneration, lumbar region: Secondary | ICD-10-CM

## 2020-03-18 DIAGNOSIS — Z951 Presence of aortocoronary bypass graft: Secondary | ICD-10-CM | POA: Diagnosis not present

## 2020-03-23 ENCOUNTER — Other Ambulatory Visit: Payer: Self-pay | Admitting: Family Medicine

## 2020-03-23 DIAGNOSIS — I10 Essential (primary) hypertension: Secondary | ICD-10-CM

## 2020-03-23 DIAGNOSIS — E782 Mixed hyperlipidemia: Secondary | ICD-10-CM

## 2020-03-23 NOTE — Telephone Encounter (Signed)
Requested Prescriptions  Pending Prescriptions Disp Refills  . atorvastatin (LIPITOR) 80 MG tablet [Pharmacy Med Name: ATORVASTATIN CALCIUM 80 MG TAB] 90 tablet 1    Sig: TAKE ONE TABLET EVERY DAY     Cardiovascular:  Antilipid - Statins Failed - 03/23/2020  8:13 AM      Failed - LDL in normal range and within 360 days    LDL Chol Calc (NIH)  Date Value Ref Range Status  08/07/2019 49 0 - 99 mg/dL Final         Passed - Total Cholesterol in normal range and within 360 days    Cholesterol, Total  Date Value Ref Range Status  08/07/2019 122 100 - 199 mg/dL Final         Passed - HDL in normal range and within 360 days    HDL  Date Value Ref Range Status  08/07/2019 52 >39 mg/dL Final         Passed - Triglycerides in normal range and within 360 days    Triglycerides  Date Value Ref Range Status  08/07/2019 115 0 - 149 mg/dL Final         Passed - Patient is not pregnant      Passed - Valid encounter within last 12 months    Recent Outpatient Visits          2 months ago Essential (primary) hypertension   [place], Deanna C, MD   5 months ago Obstructive sleep apnea hypopnea, moderate   [facility] Juline Patch, MD   7 months ago Essential (primary) hypertension   [place], Deanna C, MD   1 year ago Essential (primary) hypertension   [facility] Juline Patch, MD   1 year ago Flu vaccine need   [place], [place], MD      Future Appointments            In 4 months [place] Patch, MD [facility], [place]   In 9 months Stoioff, Ronda Fairly, MD [place]           . benazepril-hydrochlorthiazide (LOTENSIN HCT) 10-12.5 MG tablet [Pharmacy Med Name: BENAZEPRIL-HCTZ 10-12.5 MG TAB] 90 tablet 0    Sig: TAKE ONE TABLET EVERY DAY     Cardiovascular:  ACEI + Diuretic Combos Failed - 03/23/2020  8:13 AM      Failed - Na in normal range and within 180 days    Sodium   Date Value Ref Range Status  08/07/2019 143 134 - 144 mmol/L Final         Failed - K in normal range and within 180 days    Potassium  Date Value Ref Range Status  08/07/2019 4.5 3.5 - 5.2 mmol/L Final  01/01/2012 4.0 3.5 - 5.1 mmol/L Final         Failed - Cr in normal range and within 180 days    Creatinine, Ser  Date Value Ref Range Status  08/07/2019 1.23 0.76 - 1.27 mg/dL Final         Failed - Ca in normal range and within 180 days    Calcium  Date Value Ref Range Status  08/07/2019 9.1 8.6 - 10.2 mg/dL Final         Passed - Patient is not pregnant      Passed - Last BP in normal range    BP Readings from Last 1 Encounters:  01/23/20 138/62         Passed - Valid encounter within last 6 months    Recent Outpatient Visits          2 months ago Essential (primary) hypertension   [facility] Juline Patch, MD   5 months ago Obstructive sleep apnea hypopnea, moderate   [facility] Juline Patch, MD   7 months ago Essential (primary) hypertension   [facility] Juline Patch, MD   1 year ago Essential (primary) hypertension   [facility] Juline Patch, MD   1 year ago Flu vaccine need   [facility] Juline Patch, MD      Future Appointments            In 4 months Juline Patch, MD [facility], [place]   In 9 months [place], Ronda Fairly, [place]

## 2020-03-26 ENCOUNTER — Ambulatory Visit: Payer: Self-pay

## 2020-03-26 NOTE — Telephone Encounter (Signed)
Patient called stating that he has had intermittent abdominal pain for 2-3 weeks.  He states that it is in his lower abdomin and does not travel to his chest or back. He states that it is mild and seems to have come on gradually. The pain last for minutes not hours. He states it can come when his stomach is empty and or after a large meal. He has treated with Peto bismol and had relief. He reports no constipation diarrhea or vomiting. No there symptoms.  Care advice read to patient. He verbalized understanding. DT was completed and OV scheduled tomorrow.  Reason for Disposition . [1] MILD pain (e.g., does not interfere with normal activities) AND [2] pain comes and goes (cramps) [3] present > 48 hours  Answer Assessment - Initial Assessment Questions 1. LOCATION: "Where does it hurt?"      Lower abdomine 2. RADIATION: "Does the pain shoot anywhere else?" (e.g., chest, back)    no 3. ONSET: "When did the pain begin?" (Minutes, hours or days ago)      2-3 weeks  4. SUDDEN: "Gradual or sudden onset?"     gradual 5. PATTERN "Does the pain come and go, or is it constant?"    - If constant: "Is it getting better, staying the same, or worsening?"      (Note: Constant means the pain never goes away completely; most serious pain is constant and it progresses)     - If intermittent: "How long does it last?" "Do you have pain now?"     (Note: Intermittent means the pain goes away completely between bouts)     Comes and goes 6. SEVERITY: "How bad is the pain?"  (e.g., Scale 1-10; mild, moderate, or severe)    - MILD (1-3): doesn't interfere with normal activities, abdomen soft and not tender to touch     - MODERATE (4-7): interferes with normal activities or awakens from sleep, tender to touch     - SEVERE (8-10): excruciating pain, doubled over, unable to do any normal activities     1 7. RECURRENT SYMPTOM: "Have you ever had this type of abdominal pain before?" If so, ask: "When was the last time?"  and "What happened that time?"      Yes off and on never seen for it 8. CAUSE: "What do you think is causing the abdominal pain?"     Unsure pepto can sooth it 9. RELIEVING/AGGRAVATING FACTORS: "What makes it better or worse?" (e.g., movement, antacids, bowel movement)     antacid 10. OTHER SYMPTOMS: "Has there been any vomiting, diarrhea, constipation, or urine problems?"      none  Protocols used: ABDOMINAL PAIN - MALE-A-AH

## 2020-03-27 ENCOUNTER — Encounter: Payer: Self-pay | Admitting: Family Medicine

## 2020-03-27 ENCOUNTER — Other Ambulatory Visit: Payer: Self-pay

## 2020-03-27 ENCOUNTER — Ambulatory Visit: Payer: Medicare PPO | Admitting: Family Medicine

## 2020-03-27 VITALS — BP 142/80 | HR 76 | Ht 67.0 in | Wt 197.0 lb

## 2020-03-27 DIAGNOSIS — F331 Major depressive disorder, recurrent, moderate: Secondary | ICD-10-CM | POA: Diagnosis not present

## 2020-03-27 DIAGNOSIS — R1084 Generalized abdominal pain: Secondary | ICD-10-CM

## 2020-03-27 MED ORDER — DICYCLOMINE HCL 10 MG PO CAPS: 10.0000 mg | ORAL_CAPSULE | Freq: Two times a day (BID) | ORAL | 1 refills | Status: DC

## 2020-03-27 MED ORDER — SERTRALINE HCL 50 MG PO TABS: 50.0000 mg | ORAL_TABLET | Freq: Every day | ORAL | 3 refills | Status: DC

## 2020-03-27 NOTE — Progress Notes (Signed)
Date:  03/27/2020   Name:  Timothy Hatfield   DOB:  1945/11/28   MRN:  [id number]   Chief Complaint: Abdominal Pain (feels "gassy" pain radiates all across belly. Tried pepto- "seems to help") and Depression (wants to start back on sertraline 50mg - been off over a year)  Abdominal Pain This is a new problem. The current episode started 1 to 4 weeks ago. The problem occurs intermittently. The problem has been waxing and waning. The pain is located in the generalized abdominal region. The pain is at a severity of 2/10. The pain is mild. The quality of the pain is colicky. Pertinent negatives include no anorexia, arthralgias, belching, constipation, diarrhea, dysuria, fever, flatus, frequency, headaches, hematochezia, hematuria, melena, myalgias, nausea, vomiting or weight loss. The pain is relieved by eating and liquids. He has tried proton pump inhibitors (pepto) for the symptoms. The treatment provided moderate relief. Prior diagnostic workup includes GI consult, upper endoscopy, lower endoscopy, CT scan and ultrasound. His past medical history is significant for GERD. There is no history of abdominal surgery, colon cancer, Crohn's disease, gallstones, pancreatitis, PUD or ulcerative colitis. hx of barretts  Depression        This is a chronic problem.  The onset quality is gradual.   The problem occurs intermittently.  The problem has been gradually improving since onset.  Associated symptoms include fatigue, insomnia, irritable, decreased interest and sad.  Associated symptoms include no decreased concentration, no helplessness, no hopelessness, no restlessness, no appetite change, no body aches, no myalgias, no headaches, no indigestion and no suicidal ideas.  Past compliance problems include medication issues.  Previous treatment provided moderate relief.   Pertinent negatives include no hypothyroidism.  (hx of barretts)   Lab Results  Component Value Date   CREATININE 1.23 08/07/2019   BUN  13 08/07/2019   NA 143 08/07/2019   K 4.5 08/07/2019   CL 103 08/07/2019   CO2 25 08/07/2019   Lab Results  Component Value Date   CHOL 122 08/07/2019   HDL 52 08/07/2019   LDLCALC 49 08/07/2019   TRIG 115 08/07/2019   CHOLHDL 2.6 10/03/2018   No results found for: TSH No results found for: HGBA1C Lab Results  Component Value Date   WBC 8.4 03/15/2018   HGB 14.6 08/07/2019   HCT 38.2 (L) 03/16/2018   MCV 94.2 03/15/2018   PLT 260 03/15/2018   Lab Results  Component Value Date   ALT 22 08/07/2019   AST 25 08/07/2019   ALKPHOS 65 08/07/2019   BILITOT 0.8 08/07/2019     Review of Systems  Constitutional: Positive for fatigue. Negative for appetite change, chills, fever and weight loss.  HENT: Negative for drooling, ear discharge, ear pain, nosebleeds, rhinorrhea and sore throat.   Respiratory: Negative for cough, shortness of breath and wheezing.   Cardiovascular: Negative for chest pain, palpitations and leg swelling.  Gastrointestinal: Positive for abdominal pain. Negative for anorexia, blood in stool, constipation, diarrhea, flatus, hematochezia, melena, nausea and vomiting.  Endocrine: Negative for polydipsia.  Genitourinary: Negative for dysuria, frequency, hematuria and urgency.  Musculoskeletal: Negative for arthralgias, back pain, myalgias and neck pain.  Skin: Negative for rash.  Allergic/Immunologic: Negative for environmental allergies.  Neurological: Negative for dizziness and headaches.  Hematological: Does not bruise/bleed easily.  Psychiatric/Behavioral: Positive for depression. Negative for decreased concentration and suicidal ideas. The patient has insomnia. The patient is not nervous/anxious.     Patient Active Problem List   Diagnosis Date  Noted  . Benign prostatic hyperplasia with lower urinary tract symptoms 01/12/2020  . GERD without esophagitis 03/25/2018  . Moderate episode of recurrent major depressive disorder ([place]) 03/25/2018  . Acute  calcific periarthritis 06/18/2015  . Allergic rhinitis 06/18/2015  . Arthritis 06/18/2015  . Essential (primary) hypertension 06/18/2015  . 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 HLD (hyperlipidemia) 06/18/2015  . External hemorrhoid 06/18/2015  . Gastroenteritis 06/18/2015  . Bilateral nephrolithiasis 05/15/2014    Allergies  Allergen Reactions  . Bee Venom Anaphylaxis  . Ciprofloxacin Itching    Past Surgical History:  Procedure Laterality Date  . CARDIAC CATHETERIZATION Left 09/09/2016   Procedure: Left Heart Cath and Coronary Angiography;  Surgeon: Yolonda Kida, MD;  Location: [facility];  Service: Cardiovascular;  Laterality: Left;  . COLONOSCOPY  2013   normal/ Dr Vira Agar- cleared for 10 yrs  . COLONOSCOPY WITH PROPOFOL N/A 01/03/2018   Procedure: COLONOSCOPY WITH PROPOFOL;  Surgeon: Manya Silvas, MD;  Location[facility];  Service: Endoscopy;  Laterality: N/A;  . CORONARY ARTERY BYPASS GRAFT    . ESOPHAGOGASTRODUODENOSCOPY (EGD) WITH PROPOFOL N/A 01/03/2018   Procedure: ESOPHAGOGASTRODUODENOSCOPY (EGD) WITH PROPOFOL;  Surgeon: Manya Silvas, MD;  Location: [facility] ENDOSCOPY;  Service: Endoscopy;  Laterality: N/A;  . EYE SURGERY    . HERNIA REPAIR    . IR NEPHROSTOMY PLACEMENT RIGHT  03/15/2018  . kidney stones     lithotripsy  . LITHOTRIPSY    . NEPHROLITHOTOMY Right 03/15/2018   Procedure: NEPHROLITHOTOMY PERCUTANEOUS;  Surgeon: Abbie Sons, MD;  Location: [facility] ORS;  Service: Urology;  Laterality: Right;  . UPPER GI ENDOSCOPY  2013    Social History   Tobacco Use  . Smoking status: Never Smoker  . Smokeless tobacco: Never Used  Substance Use Topics  . Alcohol use: No    Alcohol/week: 0.0 standard drinks  . Drug use: No     Medication list has been reviewed and updated.  Current Meds  Medication Sig  . acetaminophen (TYLENOL) 500 MG tablet Take 1,000 mg by mouth daily as needed for moderate pain.  Marland Kitchen aspirin EC 81 MG tablet Take 81 mg  by mouth daily.  Marland Kitchen atorvastatin (LIPITOR) 80 MG tablet TAKE ONE TABLET EVERY DAY  . benazepril-hydrochlorthiazide (LOTENSIN HCT) 10-12.5 MG tablet TAKE ONE TABLET EVERY DAY  . Coenzyme Q10 (COQ-10) 100 MG CAPS Take 100 mg by mouth at bedtime.  . Dutasteride-Tamsulosin HCl (JALYN) 0.5-0.4 MG CAPS Take 1 capsule by mouth every Monday, Wednesday, and Friday. Urology- Stoioff  . EPINEPHrine 0.3 mg/0.3 mL IJ SOAJ injection Inject 0.3 mLs (0.3 mg total) into the muscle as needed for anaphylaxis.  Marland Kitchen etodolac (LODINE) 400 MG tablet TAKE ONE TABLET BY MOUTH EVERY DAY  . fluticasone (FLONASE) 50 MCG/ACT nasal spray Place 1 spray into both nostrils daily. otc  . metoprolol succinate (TOPROL-XL) 25 MG 24 hr tablet Take 1 tablet (25 mg total) by mouth daily.  . Misc Natural Products (GLUCOSAMINE CHOND DOUBLE STR PO) Take 2 tablets by mouth at bedtime.  . Multiple Vitamin (MULTIVITAMIN WITH MINERALS) TABS tablet Take 1 tablet by mouth at bedtime.  . mupirocin ointment (BACTROBAN) 2 % Apply 1 application topically 2 (two) times daily.  . nitroGLYCERIN (NITROSTAT) 0.4 MG SL tablet Place 1 tablet (0.4 mg total) under the tongue every 5 (five) minutes as needed for chest pain.  . pantoprazole (PROTONIX) 40 MG tablet Take 1 tablet (40 mg total) by mouth daily.    PHQ 2/9  Scores 03/27/2020 01/23/2020 08/07/2019 12/29/2018  PHQ - 2 Score 1 0 0 0  PHQ- 9 Score 3 0 0 0    BP Readings from Last 3 Encounters:  03/27/20 (!) 142/80  01/23/20 138/62  01/10/20 (!) 167/93    Physical Exam Vitals and nursing note reviewed.  Constitutional:      General: He is irritable.  HENT:     Head: Normocephalic.     Right Ear: External ear normal.     Left Ear: External ear normal.     Nose: Nose normal.     Mouth/Throat:     Mouth: Mucous membranes are moist.     Pharynx: Oropharynx is clear.  Eyes:     General: No scleral icterus.       Right eye: No discharge.        Left eye: No discharge.     Conjunctiva/sclera:  Conjunctivae normal.     Pupils: Pupils are equal, round, and reactive to light.  Neck:     Thyroid: No thyromegaly.     Vascular: No JVD.     Trachea: No tracheal deviation.  Cardiovascular:     Rate and Rhythm: Normal rate and regular rhythm.     Heart sounds: Normal heart sounds. No murmur. No friction rub. No gallop.   Pulmonary:     Effort: No respiratory distress.     Breath sounds: Normal breath sounds. No wheezing or rales.  Abdominal:     General: Bowel sounds are normal.     Palpations: Abdomen is soft. There is no hepatomegaly, splenomegaly or mass.     Tenderness: There is no abdominal tenderness. There is no right CVA tenderness, left CVA tenderness, guarding or rebound.     Hernia: A hernia is present. Hernia is present in the ventral area.  Genitourinary:    Prostate: Normal.     Rectum: Normal. Guaiac result negative.  Musculoskeletal:        General: No tenderness. Normal range of motion.     Cervical back: Normal range of motion and neck supple.  Lymphadenopathy:     Cervical: No cervical adenopathy.  Skin:    General: Skin is warm.     Findings: No rash.  Neurological:     Mental Status: He is alert and oriented to person, place, and time.     Cranial Nerves: No cranial nerve deficit.     Deep Tendon Reflexes: Reflexes are normal and symmetric.     Wt Readings from Last 3 Encounters:  03/27/20 197 lb (89.4 kg)  01/23/20 201 lb (91.2 kg)  01/10/20 195 lb (88.5 kg)    BP (!) 142/80   Pulse 76   Ht 5\' 7"  (1.702 m)   Wt 197 lb (89.4 kg)   BMI 30.85 kg/m   Assessment and Plan:  1. Generalized abdominal pain New onset.  Episodic.  Mild.  Pain is generalized and areas a colicky nature to it.  This is consistent with IBS however we will check his hepatic panel as well as lipase initially.  We will have a trial of Bentyl 10 mg 1 every 12-24 hours as needed for discomfort and recheck patient in 6 weeks. - dicyclomine (BENTYL) 10 MG capsule; Take 1 capsule  (10 mg total) by mouth in the morning and at bedtime.  Dispense: 60 capsule; Refill: 1 - Hepatic Function Panel (6) - Lipase  2. Moderate episode of recurrent major depressive disorder (HCC) Recurrence.  Moderate.  Relatively stable.  PHQ of 6.  Patient remembers when we started on sertraline in the past that he felt much better but he gradually weaned self off but this appears to be reemerging as a concern.  We will resume sertraline 50 mg with a 25 mg tapering up dose.  We will recheck patient in 6 to 8 weeks as to results of the medication. - sertraline (ZOLOFT) 50 MG tablet; Take 1 tablet (50 mg total) by mouth daily.  Dispense: 30 tablet; Refill: 3

## 2020-03-28 DIAGNOSIS — G4733 Obstructive sleep apnea (adult) (pediatric): Secondary | ICD-10-CM | POA: Diagnosis not present

## 2020-03-28 LAB — HEPATIC FUNCTION PANEL (6)
ALT: 24 IU/L (ref 0–44)
AST: 25 IU/L (ref 0–40)
Albumin: 4.2 g/dL (ref 3.7–4.7)
Alkaline Phosphatase: 64 IU/L (ref 39–117)
Bilirubin Total: 0.9 mg/dL (ref 0.0–1.2)
Bilirubin, Direct: 0.27 mg/dL (ref 0.00–0.40)

## 2020-03-28 LAB — LIPASE: Lipase: 33 U/L (ref 13–78)

## 2020-04-28 DIAGNOSIS — G4733 Obstructive sleep apnea (adult) (pediatric): Secondary | ICD-10-CM | POA: Diagnosis not present

## 2020-05-10 ENCOUNTER — Telehealth: Payer: Self-pay | Admitting: Family Medicine

## 2020-05-10 NOTE — Telephone Encounter (Signed)
Spoke to pt concerning being on diet

## 2020-05-10 NOTE — Telephone Encounter (Unsigned)
Copied from [place] [id number]. Topic: General - Inquiry >> May 09, 2020  3:44 PM Percell Belt A wrote: Reason for CRM:  pt called an stated he was going to start the optiva or Med fast diet and would like to know if this diet would be ok with all the med that he take.  It is a lot of bars and shakes   Best number [phone]

## 2020-05-28 ENCOUNTER — Ambulatory Visit: Payer: Medicare PPO | Admitting: Family Medicine

## 2020-05-28 ENCOUNTER — Encounter: Payer: Self-pay | Admitting: Family Medicine

## 2020-05-28 ENCOUNTER — Other Ambulatory Visit: Payer: Self-pay

## 2020-05-28 ENCOUNTER — Ambulatory Visit: Payer: Medicare PPO | Admitting: Physician Assistant

## 2020-05-28 ENCOUNTER — Ambulatory Visit
Admission: RE | Admit: 2020-05-28 | Discharge: 2020-05-28 | Disposition: A | Discharge status: Home / Self Care | Payer: Medicare PPO | Source: Ambulatory Visit | Attending: Physician Assistant | Admitting: Physician Assistant

## 2020-05-28 ENCOUNTER — Telehealth: Payer: Self-pay | Admitting: *Deleted

## 2020-05-28 ENCOUNTER — Ambulatory Visit
Admission: RE | Admit: 2020-05-28 | Discharge: 2020-05-28 | Disposition: A | Discharge status: Home / Self Care | Payer: Medicare PPO | Attending: Physician Assistant | Admitting: Physician Assistant

## 2020-05-28 ENCOUNTER — Encounter: Payer: Self-pay | Admitting: Physician Assistant

## 2020-05-28 VITALS — BP 142/80 | HR 68 | Ht 67.0 in | Wt 199.0 lb

## 2020-05-28 VITALS — BP 184/92 | HR 80 | Ht 67.0 in | Wt 198.4 lb

## 2020-05-28 DIAGNOSIS — Z87442 Personal history of urinary calculi: Secondary | ICD-10-CM | POA: Diagnosis not present

## 2020-05-28 DIAGNOSIS — N200 Calculus of kidney: Secondary | ICD-10-CM | POA: Insufficient documentation

## 2020-05-28 DIAGNOSIS — R109 Unspecified abdominal pain: Secondary | ICD-10-CM | POA: Diagnosis not present

## 2020-05-28 DIAGNOSIS — G4733 Obstructive sleep apnea (adult) (pediatric): Secondary | ICD-10-CM | POA: Diagnosis not present

## 2020-05-28 LAB — BLADDER SCAN AMB NON-IMAGING

## 2020-05-28 MED ORDER — TAMSULOSIN HCL 0.4 MG PO CAPS: 0.4000 mg | ORAL_CAPSULE | Freq: Every day | ORAL | 0 refills | Status: DC

## 2020-05-28 NOTE — Telephone Encounter (Signed)
Patient called concerned about flank pain. Denies any other urinary symptoms. Scheduled appointment for this afternoon and KUB prior. Voiced understanding.

## 2020-05-28 NOTE — Progress Notes (Signed)
05/28/2020 3:40 PM   Timothy Hatfield Aug 13, 1946 [id number]  CC: Chief Complaint  Patient presents with  . Nephrolithiasis    HPI: Timothy Hatfield is a 74 y.o. male with PMH elevated PSA and nephrolithiasis requiring intervention who presents today for evaluation of possible acute stone episode. He is an established [facility] patient who last saw Dr. Bernardo Heater on 01/10/2020 for annual follow-up of the above.  Most recent cross-sectional imaging dated 05/24/2019 revealed bilateral nonobstructing nephrolithiasis, R>L, each measuring <1cm. On the left, he had one punctate upper pole stone and one 61m lower pole stone.  Today, patient reports a 3 to 4-week history of intermittent left flank pain.  He denies fever, chills, nausea, vomiting, and gross hematuria.  He states the pain is improved with lying still.  He has taken Aleve with adequate symptom palliation.  He is not having pain today.  KUB today reveals stable nephrolithiasis, read obscured by overlying stool burden.  In-office UA today positive for 2+ blood and trace leukocyte esterase; urine microscopy with 11-30 RBCs/HPF, and calcium oxalate crystals. PVR 371m  PMH: Past Medical History:  Diagnosis Date  . Allergy   . BPH (benign prostatic hyperplasia)   . Coronary artery disease   . GERD (gastroesophageal reflux disease)   . History of kidney stones   . Hyperlipidemia   . Hypertension   . Pneumonia   . Renal disorder    kidney stones  . Sleep apnea    CPAP    Surgical History: Past Surgical History:  Procedure Laterality Date  . CARDIAC CATHETERIZATION Left 09/09/2016   Procedure: Left Heart Cath and Coronary Angiography;  Surgeon: DwYolonda KidaMD;  [facility];  Service: Cardiovascular;  Laterality: Left;  . COLONOSCOPY  2013   normal/ Dr ElVira Agarcleared for 10 yrs  . COLONOSCOPY WITH PROPOFOL N/A 01/03/2018   Procedure: COLONOSCOPY WITH PROPOFOL;  Surgeon: ElManya SilvasMD;  [facility];  Service: Endoscopy;  Laterality: N/A;  . CORONARY ARTERY BYPASS GRAFT    . ESOPHAGOGASTRODUODENOSCOPY (EGD) WITH PROPOFOL N/A 01/03/2018   Procedure: ESOPHAGOGASTRODUODENOSCOPY (EGD) WITH PROPOFOL;  Surgeon: ElManya SilvasMD;  [facility];  Service: Endoscopy;  Laterality: N/A;  . EYE SURGERY    . HERNIA REPAIR    . IR NEPHROSTOMY PLACEMENT RIGHT  03/15/2018  . kidney stones     lithotripsy  . LITHOTRIPSY    . NEPHROLITHOTOMY Right 03/15/2018   Procedure: NEPHROLITHOTOMY PERCUTANEOUS;  Surgeon: StAbbie SonsMD;  Location: [facility] ORS;  Service: Urology;  Laterality: Right;  . UPPER GI ENDOSCOPY  2013    Home Medications:  Allergies as of 05/28/2020      Reactions   Bee Venom Anaphylaxis   Ciprofloxacin Itching      Medication List       Accurate as of May 28, 2020  3:40 PM. If you have any questions, ask your nurse or doctor.        STOP taking these medications   sertraline 50 MG tablet Commonly known as: ZOLOFT Stopped by: DeOtilio MiuMD     TAKE these medications   acetaminophen 500 MG tablet Commonly known as: TYLENOL Take 1,000 mg by mouth daily as needed for moderate pain.   aspirin EC 81 MG tablet Take 81 mg by mouth daily.   atorvastatin 80 MG tablet Commonly known as: LIPITOR TAKE ONE TABLET EVERY DAY   benazepril-hydrochlorthiazide 10-12.5 MG tablet Commonly known as: LOTENSIN HCT  TAKE ONE TABLET EVERY DAY   CoQ-10 100 MG Caps Take 100 mg by mouth at bedtime.   dicyclomine 10 MG capsule Commonly known as: Bentyl Take 1 capsule (10 mg total) by mouth in the morning and at bedtime.   Dutasteride-Tamsulosin HCl 0.5-0.4 MG Caps Commonly known as: Jalyn Take 1 capsule by mouth every Monday, Wednesday, and Friday. Urology- Stoioff   EPINEPHrine 0.3 mg/0.3 mL Soaj injection Commonly known as: EPI-PEN Inject 0.3 mLs (0.3 mg total) into the muscle as needed for anaphylaxis.   etodolac 400 MG tablet Commonly known  as: LODINE TAKE ONE TABLET BY MOUTH EVERY DAY   fluticasone 50 MCG/ACT nasal spray Commonly known as: FLONASE Place 1 spray into both nostrils daily. otc   GLUCOSAMINE CHOND DOUBLE STR PO Take 2 tablets by mouth at bedtime.   metoprolol succinate 25 MG 24 hr tablet Commonly known as: TOPROL-XL Take 1 tablet (25 mg total) by mouth daily.   multivitamin with minerals Tabs tablet Take 1 tablet by mouth at bedtime.   mupirocin ointment 2 % Commonly known as: Bactroban Apply 1 application topically 2 (two) times daily.   nitroGLYCERIN 0.4 MG SL tablet Commonly known as: NITROSTAT Place 1 tablet (0.4 mg total) under the tongue every 5 (five) minutes as needed for chest pain.   pantoprazole 40 MG tablet Commonly known as: PROTONIX Take 1 tablet (40 mg total) by mouth daily.   tamsulosin 0.4 MG Caps capsule Commonly known as: FLOMAX Take 1 capsule (0.4 mg total) by mouth daily. Started by: Debroah Loop, PA-C       Allergies:  Allergies  Allergen Reactions  . Bee Venom Anaphylaxis  . Ciprofloxacin Itching    Family History: Family History  Problem Relation Age of Onset  . Arrhythmia Mother   . Coronary artery disease Father   . Diabetes Father   . Hyperlipidemia Brother     Social History:   reports that he has never smoked. He has never used smokeless tobacco. He reports that he does not drink alcohol and does not use drugs.  Physical Exam: BP (!) 184/92 (BP Location: Left Arm, Patient Position: Sitting, Cuff Size: Large)   Pulse 80   Ht _0  (1.702 m)   Wt 198 lb 6.4 oz (90 kg)   BMI 31.07 kg/m   Constitutional:  Alert and oriented, no acute distress, nontoxic appearing HEENT: [name], AT Cardiovascular: No clubbing, cyanosis, or edema Respiratory: Normal respiratory effort, no increased work of breathing Skin: No rashes, bruises or suspicious lesions Neurologic: Grossly intact, no focal deficits, moving all 4 extremities Psychiatric: Normal mood and  affect  Laboratory Data: Results for orders placed or performed in visit on 05/28/20  Microscopic Examination   Urine  Result Value Ref Range   WBC, UA 0-5 0 - 5 /hpf   RBC 11-30 (A) 0 - 2 /hpf   Epithelial Cells (non renal) 0-10 0 - 10 /hpf   Renal Epithel, UA 0-10 (A) None seen /hpf   Crystals Present (A) N/A   Crystal Type Calcium Oxalate N/A   Bacteria, UA None seen None seen/Few  BLADDER SCAN AMB NON-IMAGING  Result Value Ref Range   Scan Result 23m    Pertinent Imaging: KUB, 05/28/2020: CLINICAL DATA:  Left-sided flank pain for 2 weeks  EXAM: ABDOMEN - 1 VIEW  COMPARISON:  01/08/2020  FINDINGS: Scattered bilateral renal calculi are noted. 17 mm stone is noted in the lower pole of the left kidney. Scattered smaller stones are  noted on the right the largest of which measures 8 mm. Fecal material is noted throughout the colon. No free air is seen. No bony abnormality is noted.  IMPRESSION: Bilateral renal calculi as described above.   Electronically Signed   By: Inez Catalina M.D.   On: 05/28/2020 23:57  I personally reviewed the images referenced above and note stable bilateral nephrolithiasis.  Assessment & Plan:   1. Flank pain with history of urolithiasis 74 year old male with PMH nephrolithiasis and known bilateral renal stones presents with a 3 to 4-week history of intermittent left flank pain.  KUB with no evidence of acute stone episode. UA notable for microscopic hematuria and calcium oxalate crystals, consistent with acute stone episode and reassuring for infection.  Patient is comfortable appearing in clinic today, VSS.  No indication for urgent intervention at this time.  Recommend CT stone study for further evaluation and initiation of 1 month of Flomax for MET.  Urine strainer provided today.  We will plan for follow-up in clinic in 4 weeks with renal ultrasound prior. Patient is in agreement with this plan.  Counseled patient to contact her  office immediately or proceed to the emergency room if he develops uncontrollable pain, nausea, vomiting, fever, or chills.  He expressed understanding.  - Urinalysis, Complete - BLADDER SCAN AMB NON-IMAGING - CT RENAL STONE STUDY - tamsulosin (FLOMAX) 0.4 MG CAPS capsule; Take 1 capsule (0.4 mg total) by mouth daily.  Dispense: 30 capsule; Refill: 0 - US RENAL; Future   Return in about 4 weeks (around 06/25/2020) for Stone f/u with RUS prior.  Debroah Loop, PA-C  [facility] [address], [place], [place] [address] [phone]

## 2020-05-28 NOTE — Patient Instructions (Signed)
Start taking daily Flomax in addition to your [place] prescription. Stay well hydrated and start straining your urine. Check your pain medication at home. If it has expired, call the office and I will prescribe you more. If you develop uncontrollable pain, fever, chills, nausea, or vomiting, call our office immediately or go to the Emergency Department if it is outside of our clinic hours (M-F 8a-5p).

## 2020-05-28 NOTE — Progress Notes (Signed)
Date:  05/28/2020   Name:  Timothy Hatfield   DOB:  1946-07-14   MRN:  [id number]   Chief Complaint: Follow-up (abd pain- has a kidney stone appt today)  Abdominal Pain This is a new problem. The current episode started in the past 7 days. The onset quality is sudden. The problem occurs constantly. The problem has been waxing and waning. The pain is located in the left flank. The pain is at a severity of 8/10. The pain is moderate. Associated symptoms include hematuria. Pertinent negatives include no anorexia, arthralgias, belching, constipation, diarrhea, dysuria, fever, flatus, frequency, headaches, hematochezia, melena, myalgias, nausea, vomiting or weight loss. Nothing aggravates the pain. The treatment provided mild relief. There is no history of abdominal surgery, colon cancer, Crohn's disease, gallstones, GERD, irritable bowel syndrome, pancreatitis, PUD or ulcerative colitis. kidney stones    Lab Results  Component Value Date   CREATININE 1.23 08/07/2019   BUN 13 08/07/2019   NA 143 08/07/2019   K 4.5 08/07/2019   CL 103 08/07/2019   CO2 25 08/07/2019   Lab Results  Component Value Date   CHOL 122 08/07/2019   HDL 52 08/07/2019   LDLCALC 49 08/07/2019   TRIG 115 08/07/2019   CHOLHDL 2.6 10/03/2018   No results found for: TSH No results found for: HGBA1C Lab Results  Component Value Date   WBC 8.4 03/15/2018   HGB 14.6 08/07/2019   HCT 38.2 (L) 03/16/2018   MCV 94.2 03/15/2018   PLT 260 03/15/2018   Lab Results  Component Value Date   ALT 24 03/27/2020   AST 25 03/27/2020   ALKPHOS 64 03/27/2020   BILITOT 0.9 03/27/2020     Review of Systems  Constitutional: Negative for chills, fever and weight loss.  HENT: Negative for drooling, ear discharge, ear pain and sore throat.   Respiratory: Negative for cough, shortness of breath and wheezing.   Cardiovascular: Negative for chest pain, palpitations and leg swelling.  Gastrointestinal: Positive for abdominal  pain. Negative for anorexia, blood in stool, constipation, diarrhea, flatus, hematochezia, melena, nausea and vomiting.  Endocrine: Negative for polydipsia.  Genitourinary: Positive for hematuria. Negative for dysuria, frequency and urgency.  Musculoskeletal: Negative for arthralgias, back pain, myalgias and neck pain.  Skin: Negative for rash.  Allergic/Immunologic: Negative for environmental allergies.  Neurological: Negative for dizziness and headaches.  Hematological: Does not bruise/bleed easily.  Psychiatric/Behavioral: Negative for suicidal ideas. The patient is not nervous/anxious.     Patient Active Problem List   Diagnosis Date Noted  . Benign prostatic hyperplasia with lower urinary tract symptoms 01/12/2020  . GERD without esophagitis 03/25/2018  . Moderate episode of recurrent major depressive disorder ([place]) 03/25/2018  . Acute calcific periarthritis 06/18/2015  . Allergic rhinitis 06/18/2015  . Arthritis 06/18/2015  . Essential (primary) hypertension 06/18/2015  . 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 HLD (hyperlipidemia) 06/18/2015  . External hemorrhoid 06/18/2015  . Gastroenteritis 06/18/2015  . Bilateral nephrolithiasis 05/15/2014    Allergies  Allergen Reactions  . Bee Venom Anaphylaxis  . Ciprofloxacin Itching    Past Surgical History:  Procedure Laterality Date  . CARDIAC CATHETERIZATION Left 09/09/2016   Procedure: Left Heart Cath and Coronary Angiography;  Surgeon: Yolonda Kida, MD;  Location: [facility];  Service: Cardiovascular;  Laterality: Left;  . COLONOSCOPY  2013   normal/ Dr Vira Agar- cleared for 10 yrs  . COLONOSCOPY WITH PROPOFOL N/A 01/03/2018   Procedure: COLONOSCOPY WITH PROPOFOL;  Surgeon: Manya Silvas,  MD;  Location: [facility];  Service: Endoscopy;  Laterality: N/A;  . CORONARY ARTERY BYPASS GRAFT    . ESOPHAGOGASTRODUODENOSCOPY (EGD) WITH PROPOFOL N/A 01/03/2018   Procedure: ESOPHAGOGASTRODUODENOSCOPY (EGD) WITH  PROPOFOL;  Surgeon: Manya Silvas, MD;  Location: [facility]COPY;  Service: Endoscopy;  Laterality: N/A;  . EYE SURGERY    . HERNIA REPAIR    . IR NEPHROSTOMY PLACEMENT RIGHT  03/15/2018  . kidney stones     lithotripsy  . LITHOTRIPSY    . NEPHROLITHOTOMY Right 03/15/2018   Procedure: NEPHROLITHOTOMY PERCUTANEOUS;  Surgeon: Abbie Sons, MD;  Location: [facility];  Service: Urology;  Laterality: Right;  . UPPER GI ENDOSCOPY  2013    Social History   Tobacco Use  . Smoking status: Never Smoker  . Smokeless tobacco: Never Used  Vaping Use  . Vaping Use: Never used  Substance Use Topics  . Alcohol use: No    Alcohol/week: 0.0 standard drinks  . Drug use: No     Medication list has been reviewed and updated.  Current Meds  Medication Sig  . acetaminophen (TYLENOL) 500 MG tablet Take 1,000 mg by mouth daily as needed for moderate pain.  Marland Kitchen aspirin EC 81 MG tablet Take 81 mg by mouth daily.  Marland Kitchen atorvastatin (LIPITOR) 80 MG tablet TAKE ONE TABLET EVERY DAY  . benazepril-hydrochlorthiazide (LOTENSIN HCT) 10-12.5 MG tablet TAKE ONE TABLET EVERY DAY  . Coenzyme Q10 (COQ-10) 100 MG CAPS Take 100 mg by mouth at bedtime.  . dicyclomine (BENTYL) 10 MG capsule Take 1 capsule (10 mg total) by mouth in the morning and at bedtime.  . Dutasteride-Tamsulosin HCl (JALYN) 0.5-0.4 MG CAPS Take 1 capsule by mouth every Monday, Wednesday, and Friday. Urology- Stoioff  . EPINEPHrine 0.3 mg/0.3 mL IJ SOAJ injection Inject 0.3 mLs (0.3 mg total) into the muscle as needed for anaphylaxis.  Marland Kitchen etodolac (LODINE) 400 MG tablet TAKE ONE TABLET BY MOUTH EVERY DAY  . fluticasone (FLONASE) 50 MCG/ACT nasal spray Place 1 spray into both nostrils daily. otc  . metoprolol succinate (TOPROL-XL) 25 MG 24 hr tablet Take 1 tablet (25 mg total) by mouth daily.  . Misc Natural Products (GLUCOSAMINE CHOND DOUBLE STR PO) Take 2 tablets by mouth at bedtime.  . Multiple Vitamin (MULTIVITAMIN WITH MINERALS) TABS tablet  Take 1 tablet by mouth at bedtime.  . mupirocin ointment (BACTROBAN) 2 % Apply 1 application topically 2 (two) times daily.  . nitroGLYCERIN (NITROSTAT) 0.4 MG SL tablet Place 1 tablet (0.4 mg total) under the tongue every 5 (five) minutes as needed for chest pain.  . pantoprazole (PROTONIX) 40 MG tablet Take 1 tablet (40 mg total) by mouth daily.    PHQ 2/9 Scores 05/28/2020 03/27/2020 01/23/2020 08/07/2019  PHQ - 2 Score 0 1 0 0  PHQ- 9 Score 0 3 0 0    GAD 7 : Generalized Anxiety Score 05/28/2020 03/27/2020 01/23/2020 08/07/2019  Nervous, Anxious, on Edge 0 0 0 0  Control/stop worrying 0 0 0 0  Worry too much - different things 0 0 0 0  Trouble relaxing 0 0 0 0  Restless 0 0 0 0  Easily annoyed or irritable 0 0 0 0  Afraid - awful might happen 0 0 0 0  Total GAD 7 Score 0 0 0 0    BP Readings from Last 3 Encounters:  05/28/20 (!) 142/80  03/27/20 (!) 142/80  01/23/20 138/62    Physical Exam Vitals and nursing note reviewed.  HENT:  Head: Normocephalic.     Right Ear: Tympanic membrane, ear canal and external ear normal.     Left Ear: Tympanic membrane, ear canal and external ear normal.     Nose: Nose normal. No congestion or rhinorrhea.     Mouth/Throat:     Mouth: Mucous membranes are moist.  Eyes:     General: No scleral icterus.       Right eye: No discharge.        Left eye: No discharge.     Conjunctiva/sclera: Conjunctivae normal.     Pupils: Pupils are equal, round, and reactive to light.  Neck:     Thyroid: No thyromegaly.     Vascular: No JVD.     Trachea: No tracheal deviation.  Cardiovascular:     Rate and Rhythm: Normal rate and regular rhythm.     Heart sounds: Normal heart sounds. No murmur heard.  No friction rub. No gallop.   Pulmonary:     Effort: No respiratory distress.     Breath sounds: Normal breath sounds. No wheezing or rales.  Abdominal:     General: Bowel sounds are normal.     Palpations: Abdomen is soft. There is no mass.     Tenderness:  There is no abdominal tenderness. There is no guarding or rebound.  Musculoskeletal:        General: No tenderness. Normal range of motion.     Cervical back: Normal range of motion and neck supple.  Lymphadenopathy:     Cervical: No cervical adenopathy.  Skin:    General: Skin is warm.     Findings: No erythema or rash.  Neurological:     Mental Status: He is alert and oriented to person, place, and time.     Cranial Nerves: No cranial nerve deficit.     Deep Tendon Reflexes: Reflexes are normal and symmetric.     Wt Readings from Last 3 Encounters:  05/28/20 199 lb (90.3 kg)  03/27/20 197 lb (89.4 kg)  01/23/20 201 lb (91.2 kg)    BP (!) 142/80   Pulse 68   Ht 5\' 7"  (1.702 m)   Wt 199 lb (90.3 kg)   BMI 31.17 kg/m   Assessment and Plan:   1. Bilateral nephrolithiasis Chronic.  Recurrent.  Currently active on the left side.  Patient started having discomfort in the left flank which has subsided but resting at the mid abdominal area on the left.  This may represent either a passage of the stone or the stone becoming lodged in the ureter.  Patient will proceed with urologic work-up including flatplate of the abdomen to see if the stone is seen versus eventual CT scan of the abdomen.

## 2020-05-29 LAB — MICROSCOPIC EXAMINATION: Bacteria, UA: NONE SEEN

## 2020-06-01 ENCOUNTER — Other Ambulatory Visit: Payer: Self-pay | Admitting: Family Medicine

## 2020-06-01 DIAGNOSIS — I10 Essential (primary) hypertension: Secondary | ICD-10-CM

## 2020-06-01 DIAGNOSIS — K219 Gastro-esophageal reflux disease without esophagitis: Secondary | ICD-10-CM

## 2020-06-01 DIAGNOSIS — K2270 Barrett's esophagus without dysplasia: Secondary | ICD-10-CM

## 2020-06-01 NOTE — Telephone Encounter (Signed)
Requested Prescriptions  Pending Prescriptions Disp Refills  . metoprolol succinate (TOPROL-XL) 25 MG 24 hr tablet [Pharmacy Med Name: METOPROLOL SUCCINATE ER 25 MG TAB] 90 tablet 1    Sig: TAKE 1 TABLET BY MOUTH ONCE DAILY     Cardiovascular:  Beta Blockers Failed - 06/01/2020  1:08 PM      Failed - Last BP in normal range    BP Readings from Last 1 Encounters:  05/28/20 (!) 184/92         Passed - Last Heart Rate in normal range    Pulse Readings from Last 1 Encounters:  05/28/20 80         Passed - Valid encounter within last 6 months    Recent Outpatient Visits          4 days ago Bilateral nephrolithiasis   [facility] Juline Patch, MD   2 months ago Generalized abdominal pain   [facility] Juline Patch, MD   4 months ago Essential (primary) hypertension   [facility] Juline Patch, MD   8 months ago Obstructive sleep apnea hypopnea, moderate   [facility] Juline Patch, MD   9 months ago Essential (primary) hypertension   [facility] Juline Patch, MD      Future Appointments            In 3 weeks Vaillancourt, Corlis Leak [place]   In 1 month Juline Patch, MD [facility], [place]   In 7 months Stoioff, Ronda Fairly, MD [address]           . pantoprazole (PROTONIX) 40 MG tablet [Pharmacy Med Name: PANTOPRAZOLE SODIUM 40 MG TAB] 90 tablet 1    Sig: TAKE 1 TABLET BY MOUTH DAILY     Gastroenterology: Proton Pump Inhibitors Passed - 06/01/2020  1:08 PM      Passed - Valid encounter within last 12 months    Recent Outpatient Visits          4 days ago Bilateral nephrolithiasis   [facility] Juline Patch, MD   2 months ago Generalized abdominal pain   [facility] Juline Patch, MD   4 months ago Essential (primary) hypertension   [facility] Juline Patch, MD   8 months ago Obstructive sleep apnea hypopnea,  moderate   [facility] Juline Patch, MD   9 months ago Essential (primary) hypertension   [facility] Juline Patch, MD      Future Appointments            In 3 weeks Vaillancourt, Corlis Leak [place]   In 1 month Juline Patch, MD [facility], [place]   In 7 months Toftrees, Ronda Fairly, [facility]

## 2020-06-21 ENCOUNTER — Ambulatory Visit
Admission: RE | Admit: 2020-06-21 | Discharge: 2020-06-21 | Disposition: A | Discharge status: Home / Self Care | Payer: Medicare PPO | Source: Ambulatory Visit | Attending: Physician Assistant | Admitting: Physician Assistant

## 2020-06-21 ENCOUNTER — Other Ambulatory Visit: Payer: Self-pay | Admitting: Family Medicine

## 2020-06-21 ENCOUNTER — Other Ambulatory Visit: Payer: Self-pay

## 2020-06-21 DIAGNOSIS — R109 Unspecified abdominal pain: Secondary | ICD-10-CM | POA: Diagnosis not present

## 2020-06-21 DIAGNOSIS — Z87442 Personal history of urinary calculi: Secondary | ICD-10-CM | POA: Insufficient documentation

## 2020-06-21 DIAGNOSIS — N210 Calculus in bladder: Secondary | ICD-10-CM | POA: Diagnosis not present

## 2020-06-21 DIAGNOSIS — N132 Hydronephrosis with renal and ureteral calculous obstruction: Secondary | ICD-10-CM | POA: Diagnosis not present

## 2020-06-21 DIAGNOSIS — K5730 Diverticulosis of large intestine without perforation or abscess without bleeding: Secondary | ICD-10-CM | POA: Diagnosis not present

## 2020-06-21 DIAGNOSIS — N281 Cyst of kidney, acquired: Secondary | ICD-10-CM | POA: Diagnosis not present

## 2020-06-21 DIAGNOSIS — I10 Essential (primary) hypertension: Secondary | ICD-10-CM

## 2020-06-24 ENCOUNTER — Other Ambulatory Visit: Payer: Self-pay | Admitting: Family Medicine

## 2020-06-24 DIAGNOSIS — R1084 Generalized abdominal pain: Secondary | ICD-10-CM

## 2020-06-24 DIAGNOSIS — E782 Mixed hyperlipidemia: Secondary | ICD-10-CM

## 2020-06-25 ENCOUNTER — Other Ambulatory Visit: Payer: Self-pay

## 2020-06-25 ENCOUNTER — Ambulatory Visit (INDEPENDENT_AMBULATORY_CARE_PROVIDER_SITE_OTHER): Payer: Medicare PPO | Admitting: Physician Assistant

## 2020-06-25 ENCOUNTER — Encounter: Payer: Self-pay | Admitting: Physician Assistant

## 2020-06-25 VITALS — BP 183/94 | HR 76 | Ht 67.0 in | Wt 190.9 lb

## 2020-06-25 DIAGNOSIS — N201 Calculus of ureter: Secondary | ICD-10-CM | POA: Diagnosis not present

## 2020-06-25 DIAGNOSIS — N200 Calculus of kidney: Secondary | ICD-10-CM | POA: Diagnosis not present

## 2020-06-25 DIAGNOSIS — N401 Enlarged prostate with lower urinary tract symptoms: Secondary | ICD-10-CM | POA: Diagnosis not present

## 2020-06-25 MED ORDER — TAMSULOSIN HCL 0.4 MG PO CAPS: 0.4000 mg | ORAL_CAPSULE | Freq: Every day | ORAL | 5 refills | Status: DC

## 2020-06-25 MED ORDER — DUTASTERIDE 0.5 MG PO CAPS: 0.5000 mg | ORAL_CAPSULE | ORAL | 5 refills | Status: DC

## 2020-06-25 MED ORDER — OXYCODONE-ACETAMINOPHEN 5-325 MG PO TABS: 1.0000 | ORAL_TABLET | Freq: Four times a day (QID) | ORAL | 0 refills | Status: AC | PRN

## 2020-06-25 NOTE — Progress Notes (Signed)
06/25/2020 2:45 PM   [name] 23-Jan-1946 [id number]  CC: Chief Complaint  Patient presents with   Nephrolithiasis   Results    HPI: LEWAYNE PAULEY is a 74 y.o. male with PMH nephrolithiasis requiring intervention who presents today for follow-up of an acute stone episode. I saw him in clinic on 05/28/2020 with reports of 3-4 weeks of intermittent left flank pain; KUB reassuring for acute stone episode with MH and calcium oxalate crystals on UA. I ordered a CT stone study and started him on Flomax for a trial of passage at that time.  Patient followed up with a CT stone study and same day renal ultrasound on 06/21/2020. CT revealed bilateral nonobstructing renal calculi, increased in size compared to prior, featuring a 59mm left renal pelvic stone; two adjacent proximal right ureteral stones measuring up to 60mm with right hydronephrosis; and multiple bladder calculi, increased in number compared to prior. Renal US with right hydronephrosis and stones consistent with CT.  Today he reports having passed 2 stones at home following his dual imaging studies.  He has had resolution of his flank pain thereafter.  Additionally, he reports doing well on Flomax without side effects and would like to start taking this daily.  He attributes stone passage to this medication.  In-office UA today positive for 2+ blood and 1+ protein; urine microscopy with 11-30 RBCs/HPF.   PMH: Past Medical History:  Diagnosis Date   Allergy    BPH (benign prostatic hyperplasia)    Coronary artery disease    GERD (gastroesophageal reflux disease)    History of kidney stones    Hyperlipidemia    Hypertension    Pneumonia    Renal disorder    kidney stones   Sleep apnea    CPAP    Surgical History: Past Surgical History:  Procedure Laterality Date   CARDIAC CATHETERIZATION Left 09/09/2016   Procedure: Left Heart Cath and Coronary Angiography;  Surgeon: Yolonda Kida, MD;   Location: [facility];  Service: Cardiovascular;  Laterality: Left;   COLONOSCOPY  2013   normal/ Dr Vira Agar- cleared for 10 yrs   COLONOSCOPY WITH PROPOFOL N/A 01/03/2018   Procedure: COLONOSCOPY WITH PROPOFOL;  Surgeon: Manya Silvas, MD;  Location: [facility] ENDOSCOPY;  Service: Endoscopy;  Laterality: N/A;   CORONARY ARTERY BYPASS GRAFT     ESOPHAGOGASTRODUODENOSCOPY (EGD) WITH PROPOFOL N/A 01/03/2018   Procedure: ESOPHAGOGASTRODUODENOSCOPY (EGD) WITH PROPOFOL;  Surgeon: Manya Silvas, MD;  Location: [facility] ENDOSCOPY;  Service: Endoscopy;  Laterality: N/A;   EYE SURGERY     HERNIA REPAIR     IR NEPHROSTOMY PLACEMENT RIGHT  03/15/2018   kidney stones     lithotripsy   LITHOTRIPSY     NEPHROLITHOTOMY Right 03/15/2018   Procedure: NEPHROLITHOTOMY PERCUTANEOUS;  Surgeon: Abbie Sons, MD;  Location: [facility] ORS;  Service: Urology;  Laterality: Right;   UPPER GI ENDOSCOPY  2013    Home Medications:  Allergies as of 06/25/2020      Reactions   Bee Venom Anaphylaxis   Ciprofloxacin Itching      Medication List       Accurate as of June 25, 2020  2:45 PM. If you have any questions, ask your nurse or doctor.        STOP taking these medications   Dutasteride-Tamsulosin HCl 0.5-0.4 MG Caps Commonly known as: Jalyn Stopped by: Debroah Loop, PA-C     TAKE these medications   acetaminophen 500 MG tablet Commonly  known as: TYLENOL Take 1,000 mg by mouth daily as needed for moderate pain.   aspirin EC 81 MG tablet Take 81 mg by mouth daily.   atorvastatin 80 MG tablet Commonly known as: LIPITOR TAKE ONE TABLET EVERY DAY   benazepril-hydrochlorthiazide 10-12.5 MG tablet Commonly known as: LOTENSIN HCT TAKE ONE TABLET EVERY DAY   CoQ-10 100 MG Caps Take 100 mg by mouth at bedtime.   dicyclomine 10 MG capsule Commonly known as: BENTYL TAKE 1 CAPSULE BY MOUTH EVERY MORNING AND TAKE 1 CAPSULE AT BEDTIME   dutasteride 0.5 MG capsule Commonly  known as: AVODART Take 1 capsule (0.5 mg total) by mouth every Monday, Wednesday, and Friday. Start taking on: June 26, 2020 Started by: Debroah Loop, PA-C   EPINEPHrine 0.3 mg/0.3 mL Soaj injection Commonly known as: EPI-PEN Inject 0.3 mLs (0.3 mg total) into the muscle as needed for anaphylaxis.   etodolac 400 MG tablet Commonly known as: LODINE TAKE ONE TABLET BY MOUTH EVERY DAY   fluticasone 50 MCG/ACT nasal spray Commonly known as: FLONASE Place 1 spray into both nostrils daily. otc   GLUCOSAMINE CHOND DOUBLE STR PO Take 2 tablets by mouth at bedtime.   metoprolol succinate 25 MG 24 hr tablet Commonly known as: TOPROL-XL TAKE 1 TABLET BY MOUTH ONCE DAILY   multivitamin with minerals Tabs tablet Take 1 tablet by mouth at bedtime.   mupirocin ointment 2 % Commonly known as: Bactroban Apply 1 application topically 2 (two) times daily.   nitroGLYCERIN 0.4 MG SL tablet Commonly known as: NITROSTAT Place 1 tablet (0.4 mg total) under the tongue every 5 (five) minutes as needed for chest pain.   oxyCODONE-acetaminophen 5-325 MG tablet Commonly known as: PERCOCET/ROXICET Take 1 tablet by mouth every 6 (six) hours as needed for up to 5 days for severe pain. Started by: Debroah Loop, PA-C   pantoprazole 40 MG tablet Commonly known as: PROTONIX TAKE 1 TABLET BY MOUTH DAILY   tamsulosin 0.4 MG Caps capsule Commonly known as: FLOMAX Take 1 capsule (0.4 mg total) by mouth daily.       Allergies:  Allergies  Allergen Reactions   Bee Venom Anaphylaxis   Ciprofloxacin Itching    Family History: Family History  Problem Relation Age of Onset   Arrhythmia Mother    Coronary artery disease Father    Diabetes Father    Hyperlipidemia Brother     Social History:   reports that he has never smoked. He has never used smokeless tobacco. He reports that he does not drink alcohol and does not use drugs.  Physical Exam: BP (!) 183/94 (BP  Location: Left Arm, Patient Position: Sitting, Cuff Size: Normal)    Pulse 76    Ht 5\' 7"  (1.702 m)    Wt 190 lb 14.4 oz (86.6 kg)    BMI 29.90 kg/m   Constitutional:  Alert and oriented, no acute distress, nontoxic appearing HEENT: , AT Cardiovascular: No clubbing, cyanosis, or edema Respiratory: Normal respiratory effort, no increased work of breathing Skin: No rashes, bruises or suspicious lesions Neurologic: Grossly intact, no focal deficits, moving all 4 extremities Psychiatric: Normal mood and affect  Laboratory Data: Results for orders placed or performed in visit on 06/25/20  Microscopic Examination   Urine  Result Value Ref Range   WBC, UA 0-5 0 - 5 /hpf   RBC 11-30 (A) 0 - 2 /hpf   Epithelial Cells (non renal) 0-10 0 - 10 /hpf   Renal Epithel, UA 0-10 (  A) None seen /hpf   Casts Present (A) None seen /lpf   Cast Type Hyaline casts N/A   Bacteria, UA None seen None seen/Few  Urinalysis, Complete  Result Value Ref Range   Specific Gravity, UA 1.025 1.005 - 1.030   pH, UA 6.0 5.0 - 7.5   Color, UA Orange Yellow   Appearance Ur Hazy (A) Clear   Leukocytes,UA Negative Negative   Protein,UA 1+ (A) Negative/Trace   Glucose, UA Negative Negative   Ketones, UA Negative Negative   RBC, UA 2+ (A) Negative   Bilirubin, UA Negative Negative   Urobilinogen, Ur 0.2 0.2 - 1.0 mg/dL   Nitrite, UA Negative Negative   Microscopic Examination See below:    Pertinent Imaging: Results for orders placed during the hospital encounter of 06/21/20  US RENAL  Narrative CLINICAL DATA:  Nephrolithiasis, flank pain side not specified  EXAM: RENAL / URINARY TRACT ULTRASOUND COMPLETE  COMPARISON:  CT abdomen and pelvis 06/21/2020  FINDINGS: Right Kidney:  Renal measurements: 10.9 x 6.5 x 4.5 cm = volume: 166 mL. Cortical thinning. Upper normal cortical echogenicity. RIGHT hydronephrosis. Small cyst at upper pole 19 x 16 x 20 mm. Echogenic focus at mid kidney 9 mm diameter  consistent with calculus. No additional masses.  Left Kidney:  Renal measurements: 11.1 x 6.4 x 4.7 cm = volume: 175 mL. Cortical thinning. Normal cortical echogenicity. Large shadowing calcification at renal pelvis 15 mm diameter consistent with calculus. No hydronephrosis. Cyst identified at inferior pole 3.7 x 2.6 x 3.7 cm.  Bladder:  Appears normal for degree of bladder distention.  Other:  None.  IMPRESSION: BILATERAL renal cysts and calculi as above.  RIGHT hydronephrosis.   Electronically Signed By: Lavonia Dana M.D. On: 06/21/2020 19:01  Results for orders placed in visit on 05/28/20  CT RENAL STONE STUDY  Narrative CLINICAL DATA:  Left-sided flank pain for several weeks  EXAM: CT ABDOMEN AND PELVIS WITHOUT CONTRAST  TECHNIQUE: Multidetector CT imaging of the abdomen and pelvis was performed following the standard protocol without IV contrast.  COMPARISON:  05/28/2020, ultrasound from earlier in the same day, CT from 05/24/2019.  FINDINGS: Lower chest: No acute abnormality.  Hepatobiliary: Dependent density is noted within the gallbladder consistent with small stones. The liver is within normal limits.  Pancreas: Unremarkable. No pancreatic ductal dilatation or surrounding inflammatory changes.  Spleen: Normal in size without focal abnormality.  Adrenals/Urinary Tract: Adrenal glands are within normal limits. The kidneys are well visualized bilaterally with renal cystic change similar to that prior CT as well as on recent ultrasound. Multiple nonobstructing right renal stones are seen. Additionally there are 2 proximal right ureteral stones. These cause mild hydronephrosis. The largest of these measures 8 mm in greatest dimension. The more distal right ureter is within normal limits. Left kidney demonstrates renal pelvic stone is well as nonobstructing renal calculi. The renal pelvic stone measures 17 mm in greatest dimension. The left ureter is  otherwise within normal limits. The bladder is well distended with multiple bladder calculi consistent with prior passage of stones.  Stomach/Bowel: Diverticular change of the colon is noted without evidence of diverticulitis. No obstructive changes are seen. The appendix is within normal limits. No small bowel or gastric abnormality is noted.  Vascular/Lymphatic: Aortic atherosclerosis. No enlarged abdominal or pelvic lymph nodes.  Reproductive: Prostate is unremarkable.  Other: No abdominal wall hernia or abnormality. No abdominopelvic ascites.  Musculoskeletal: Degenerative changes of the hip joints and lumbar spine are noted.  IMPRESSION: Bilateral nonobstructing renal calculi as described.  Two calculi within the proximal right ureter causing right-sided hydronephrosis.  Bilateral renal cysts.  Diverticulosis without diverticulitis.  Multiple bladder calculi   Electronically Signed By: Inez Catalina M.D. On: 06/21/2020 23:36  I personally reviewed the images referenced above and note bilateral nonobstructing nephrolithiasis with a large left renal pelvic stone, 2 obstructing proximal right ureteral stones, and multiple bladder stones.  Assessment & Plan:   74 year old male with a history of BPH and nephrolithiasis requiring intervention presents for follow-up of an acute stone episode.  He reports passing 2 stones at home shortly after undergoing CT stone study and renal ultrasound which revealed bilateral nonobstructing nephrolithiasis with a large left renal pelvic stone, 2 obstructing proximal right ureteral stones, and multiple bladder stones. 1. Right ureteral stone I explained to the patient that I cannot be certain that the stones he passed at home were his right ureteral stones given his multiple stones visualized on imaging.  I explained that these stones are the most clinically relevant at this point, as they are associated with urinary obstruction.  I  explained that prolonged urinary obstruction can lead to renal damage.  I offered him definitive stone management in the form of a staged ureteroscopy with laser lithotripsy and stent placement for management of his bilateral renal stones, right ureteral stones, and bladder stones.  Patient does not wish to undergo ureteroscopy due to concerns for poorly tolerated ureteral stent in the past.  Patient continues to report that his most bothersome symptom has been left flank pain, contralateral to his obstructing proximal ureteral stones.  I explained that his nonobstructing left renal pelvic stone may be large enough to be the source of his pain, however I cannot guarantee this and he may continue to experience pain after treating the stone.  He expressed understanding and would like to pursue ESWL for management of this stone to avoid further stone enlargement and need for PCNL in the future.  Given patient preference for conservative management, will obtain repeat renal US to evaluation for persistence vs resolution of right hydronephrosis. If hydronephrosis has resolved, then he has likely passed his right ureteral stones and I will book him for ESWL of his left renal pelvic stone. If hydronephrosis persists, I will book him for right URS/LL/stent placement with Dr. Bernardo Heater with plans for left ESWL later.  UA today reassuring for infection, persistent MH likely secondary to urolithiasis. Will defer further hematuria workup pending definitive stone management. Prescribing Percocet for pain relief PRN. - Urinalysis, Complete - US RENAL - oxyCODONE-acetaminophen (PERCOCET/ROXICET) 5-325 MG tablet; Take 1 tablet by mouth every 6 (six) hours as needed for up to 5 days for severe pain.  Dispense: 5 tablet; Refill: 0  2. Benign prostatic hyperplasia with lower urinary tract symptoms, symptom details unspecified Per patient request, will switch from combined Jalyn three times weekly to dutasteride 3 times  weekly + Flomax daily. - tamsulosin (FLOMAX) 0.4 MG CAPS capsule; Take 1 capsule (0.4 mg total) by mouth daily.  Dispense: 30 capsule; Refill: 5 - dutasteride (AVODART) 0.5 MG capsule; Take 1 capsule (0.5 mg total) by mouth every Monday, Wednesday, and Friday.  Dispense: 12 capsule; Refill: 5  Return for Will call with results.  Debroah Loop, PA-C  [facility] [address] [place], [place] [address] [phone]

## 2020-06-27 LAB — URINALYSIS, COMPLETE
Bilirubin, UA: NEGATIVE
Glucose, UA: NEGATIVE
Ketones, UA: NEGATIVE
Leukocytes,UA: NEGATIVE
Nitrite, UA: NEGATIVE
Specific Gravity, UA: 1.025 (ref 1.005–1.030)
Urobilinogen, Ur: 0.2 mg/dL (ref 0.2–1.0)
pH, UA: 6 (ref 5.0–7.5)

## 2020-06-27 LAB — MICROSCOPIC EXAMINATION: Bacteria, UA: NONE SEEN

## 2020-06-28 ENCOUNTER — Telehealth: Payer: Self-pay

## 2020-06-28 DIAGNOSIS — G4733 Obstructive sleep apnea (adult) (pediatric): Secondary | ICD-10-CM | POA: Diagnosis not present

## 2020-06-28 NOTE — Telephone Encounter (Signed)
Incoming call from pt on triage line stating that he has not been called by radiology to schedule U/S and is still having some pain on the LT side. Gave pt number to scheduling for U/S advised pt to call them, call us back if pain worsens, seek care in ED if pain is uncontrolled by medication given.

## 2020-07-01 ENCOUNTER — Other Ambulatory Visit: Payer: Self-pay

## 2020-07-01 ENCOUNTER — Ambulatory Visit
Admission: RE | Admit: 2020-07-01 | Discharge: 2020-07-01 | Disposition: A | Discharge status: Home / Self Care | Payer: Medicare PPO | Source: Ambulatory Visit | Attending: Physician Assistant | Admitting: Physician Assistant

## 2020-07-01 DIAGNOSIS — N201 Calculus of ureter: Secondary | ICD-10-CM | POA: Diagnosis not present

## 2020-07-01 DIAGNOSIS — N281 Cyst of kidney, acquired: Secondary | ICD-10-CM | POA: Diagnosis not present

## 2020-07-03 ENCOUNTER — Other Ambulatory Visit: Payer: Self-pay | Admitting: Radiology

## 2020-07-03 DIAGNOSIS — N200 Calculus of kidney: Secondary | ICD-10-CM

## 2020-07-04 ENCOUNTER — Other Ambulatory Visit: Payer: Self-pay | Admitting: Radiology

## 2020-07-04 DIAGNOSIS — N200 Calculus of kidney: Secondary | ICD-10-CM

## 2020-07-05 DIAGNOSIS — G4733 Obstructive sleep apnea (adult) (pediatric): Secondary | ICD-10-CM | POA: Diagnosis not present

## 2020-07-09 ENCOUNTER — Other Ambulatory Visit: Admission: RE | Admit: 2020-07-09 | Payer: Medicare PPO | Source: Ambulatory Visit

## 2020-07-11 ENCOUNTER — Ambulatory Visit: Payer: Medicare PPO

## 2020-07-11 ENCOUNTER — Encounter: Payer: Self-pay | Admitting: Urology

## 2020-07-11 ENCOUNTER — Ambulatory Visit: Admit: 2020-07-11 | Payer: Medicare PPO | Admitting: Urology

## 2020-07-11 ENCOUNTER — Ambulatory Visit
Admission: RE | Admit: 2020-07-11 | Discharge: 2020-07-11 | Disposition: A | Discharge status: Home / Self Care | Payer: Medicare PPO | Attending: Urology | Admitting: Urology

## 2020-07-11 ENCOUNTER — Encounter
Admission: RE | Disposition: A | Discharge status: Home / Self Care | Payer: Self-pay | Source: Home / Self Care | Attending: Urology

## 2020-07-11 ENCOUNTER — Ambulatory Visit: Payer: Medicare PPO | Admitting: Certified Registered Nurse Anesthetist

## 2020-07-11 ENCOUNTER — Other Ambulatory Visit: Payer: Self-pay

## 2020-07-11 DIAGNOSIS — N202 Calculus of kidney with calculus of ureter: Secondary | ICD-10-CM | POA: Diagnosis not present

## 2020-07-11 DIAGNOSIS — Z8249 Family history of ischemic heart disease and other diseases of the circulatory system: Secondary | ICD-10-CM | POA: Insufficient documentation

## 2020-07-11 DIAGNOSIS — I10 Essential (primary) hypertension: Secondary | ICD-10-CM | POA: Diagnosis not present

## 2020-07-11 DIAGNOSIS — N200 Calculus of kidney: Secondary | ICD-10-CM

## 2020-07-11 DIAGNOSIS — E785 Hyperlipidemia, unspecified: Secondary | ICD-10-CM | POA: Diagnosis not present

## 2020-07-11 DIAGNOSIS — Z87442 Personal history of urinary calculi: Secondary | ICD-10-CM | POA: Diagnosis not present

## 2020-07-11 DIAGNOSIS — N210 Calculus in bladder: Secondary | ICD-10-CM | POA: Diagnosis not present

## 2020-07-11 DIAGNOSIS — G4730 Sleep apnea, unspecified: Secondary | ICD-10-CM | POA: Insufficient documentation

## 2020-07-11 DIAGNOSIS — M1990 Unspecified osteoarthritis, unspecified site: Secondary | ICD-10-CM | POA: Diagnosis not present

## 2020-07-11 DIAGNOSIS — N201 Calculus of ureter: Secondary | ICD-10-CM | POA: Diagnosis not present

## 2020-07-11 DIAGNOSIS — N400 Enlarged prostate without lower urinary tract symptoms: Secondary | ICD-10-CM | POA: Diagnosis not present

## 2020-07-11 DIAGNOSIS — I2510 Atherosclerotic heart disease of native coronary artery without angina pectoris: Secondary | ICD-10-CM | POA: Diagnosis not present

## 2020-07-11 DIAGNOSIS — Z951 Presence of aortocoronary bypass graft: Secondary | ICD-10-CM | POA: Diagnosis not present

## 2020-07-11 HISTORY — PX: EXTRACORPOREAL SHOCK WAVE LITHOTRIPSY: SHX1557

## 2020-07-11 HISTORY — PX: CYSTOSCOPY/URETEROSCOPY/HOLMIUM LASER/STENT PLACEMENT: SHX6546

## 2020-07-11 HISTORY — PX: STONE EXTRACTION WITH BASKET: SHX5318

## 2020-07-11 HISTORY — PX: CYSTOSCOPY W/ RETROGRADES: SHX1426

## 2020-07-11 SURGERY — LITHOTRIPSY, ESWL
Anesthesia: Moderate Sedation | Laterality: Left

## 2020-07-11 SURGERY — CYSTOSCOPY/URETEROSCOPY/HOLMIUM LASER/STENT PLACEMENT
Anesthesia: General | Laterality: Bilateral

## 2020-07-11 MED ORDER — CEFAZOLIN SODIUM-DEXTROSE 2-4 GM/100ML-% IV SOLN: 2.0000 g | INTRAVENOUS | Status: AC

## 2020-07-11 MED ORDER — HYDROCODONE-ACETAMINOPHEN 7.5-325 MG PO TABS
1.0000 | ORAL_TABLET | Freq: Once | ORAL | Status: DC | PRN
  Filled 2020-07-11: qty 1

## 2020-07-11 MED ORDER — DIAZEPAM 5 MG PO TABS: 10.0000 mg | ORAL_TABLET | ORAL | Status: AC

## 2020-07-11 MED ORDER — ACETAMINOPHEN 325 MG PO TABS: 325.0000 mg | ORAL_TABLET | ORAL | Status: DC | PRN

## 2020-07-11 MED ORDER — FENTANYL CITRATE (PF) 100 MCG/2ML IJ SOLN: 25.0000 ug | INTRAMUSCULAR | Status: DC | PRN

## 2020-07-11 MED ORDER — ONDANSETRON HCL 4 MG/2ML IJ SOLN
INTRAMUSCULAR | Status: AC
  Administered 2020-07-11: 4 mg via INTRAVENOUS
  Filled 2020-07-11: qty 2

## 2020-07-11 MED ORDER — PROPOFOL 10 MG/ML IV BOLUS
INTRAVENOUS | Status: AC
  Filled 2020-07-11: qty 40

## 2020-07-11 MED ORDER — DIAZEPAM 5 MG PO TABS
ORAL_TABLET | ORAL | Status: AC
  Administered 2020-07-11: 10 mg via ORAL
  Filled 2020-07-11: qty 2

## 2020-07-11 MED ORDER — HYDROCODONE-ACETAMINOPHEN 5-325 MG PO TABS
1.0000 | ORAL_TABLET | ORAL | 0 refills | Status: AC | PRN
Start: 2020-07-11 — End: 2020-07-17

## 2020-07-11 MED ORDER — DIPHENHYDRAMINE HCL 25 MG PO CAPS: 25.0000 mg | ORAL_CAPSULE | ORAL | Status: AC

## 2020-07-11 MED ORDER — LACTATED RINGERS IV SOLN: INTRAVENOUS | Status: DC | PRN

## 2020-07-11 MED ORDER — ONDANSETRON HCL 4 MG/2ML IJ SOLN: 4.0000 mg | Freq: Once | INTRAMUSCULAR | Status: AC | PRN

## 2020-07-11 MED ORDER — KETOROLAC TROMETHAMINE 15 MG/ML IJ SOLN
INTRAMUSCULAR | Status: DC | PRN
  Administered 2020-07-11: 15 mg via INTRAVENOUS

## 2020-07-11 MED ORDER — BELLADONNA ALKALOIDS-OPIUM 16.2-60 MG RE SUPP
RECTAL | Status: DC | PRN
Start: 2020-07-11 — End: 2020-07-11
  Administered 2020-07-11: 1 via RECTAL

## 2020-07-11 MED ORDER — CEFAZOLIN SODIUM-DEXTROSE 2-3 GM-%(50ML) IV SOLR
INTRAVENOUS | Status: DC | PRN
  Administered 2020-07-11: 2 g via INTRAVENOUS

## 2020-07-11 MED ORDER — CEFAZOLIN SODIUM-DEXTROSE 2-4 GM/100ML-% IV SOLN
INTRAVENOUS | Status: AC
  Administered 2020-07-11: 2 g via INTRAVENOUS
  Filled 2020-07-11: qty 100

## 2020-07-11 MED ORDER — DIPHENHYDRAMINE HCL 25 MG PO CAPS
ORAL_CAPSULE | ORAL | Status: AC
  Administered 2020-07-11: 25 mg via ORAL
  Filled 2020-07-11: qty 1

## 2020-07-11 MED ORDER — PROPOFOL 10 MG/ML IV BOLUS
INTRAVENOUS | Status: DC | PRN
  Administered 2020-07-11: 150 mg via INTRAVENOUS

## 2020-07-11 MED ORDER — CHLORHEXIDINE GLUCONATE 0.12 % MT SOLN: 15.0000 mL | Freq: Once | OROMUCOSAL | Status: DC

## 2020-07-11 MED ORDER — LIDOCAINE HCL (CARDIAC) PF 100 MG/5ML IV SOSY
PREFILLED_SYRINGE | INTRAVENOUS | Status: DC | PRN
  Administered 2020-07-11: 100 mg via INTRAVENOUS

## 2020-07-11 MED ORDER — CHLORHEXIDINE GLUCONATE 0.12 % MT SOLN
OROMUCOSAL | Status: AC
  Filled 2020-07-11: qty 15

## 2020-07-11 MED ORDER — MEPERIDINE HCL 50 MG/ML IJ SOLN: 6.2500 mg | INTRAMUSCULAR | Status: DC | PRN

## 2020-07-11 MED ORDER — FENTANYL CITRATE (PF) 100 MCG/2ML IJ SOLN
INTRAMUSCULAR | Status: AC
Start: 2020-07-11 — End: ?
  Filled 2020-07-11: qty 2

## 2020-07-11 MED ORDER — ROCURONIUM BROMIDE 100 MG/10ML IV SOLN
INTRAVENOUS | Status: DC | PRN
  Administered 2020-07-11: 50 mg via INTRAVENOUS
  Administered 2020-07-11: 10 mg via INTRAVENOUS

## 2020-07-11 MED ORDER — FENTANYL CITRATE (PF) 100 MCG/2ML IJ SOLN
INTRAMUSCULAR | Status: DC | PRN
  Administered 2020-07-11 (×2): 50 ug via INTRAVENOUS

## 2020-07-11 MED ORDER — PROMETHAZINE HCL 25 MG/ML IJ SOLN: 6.2500 mg | INTRAMUSCULAR | Status: DC | PRN

## 2020-07-11 MED ORDER — OXYBUTYNIN CHLORIDE ER 10 MG PO TB24: 10.0000 mg | ORAL_TABLET | Freq: Every day | ORAL | 0 refills | Status: DC | PRN

## 2020-07-11 MED ORDER — SUGAMMADEX SODIUM 200 MG/2ML IV SOLN
INTRAVENOUS | Status: DC | PRN
  Administered 2020-07-11: 300 mg via INTRAVENOUS

## 2020-07-11 MED ORDER — DEXAMETHASONE SODIUM PHOSPHATE 10 MG/ML IJ SOLN
INTRAMUSCULAR | Status: DC | PRN
  Administered 2020-07-11: 5 mg via INTRAVENOUS

## 2020-07-11 MED ORDER — ORAL CARE MOUTH RINSE: 15.0000 mL | Freq: Once | OROMUCOSAL | Status: DC

## 2020-07-11 MED ORDER — PHENYLEPHRINE HCL (PRESSORS) 10 MG/ML IV SOLN
INTRAVENOUS | Status: DC | PRN
  Administered 2020-07-11: 100 ug via INTRAVENOUS

## 2020-07-11 MED ORDER — ACETAMINOPHEN 160 MG/5ML PO SOLN
325.0000 mg | ORAL | Status: DC | PRN
  Filled 2020-07-11: qty 20.3

## 2020-07-11 MED ORDER — SULFAMETHOXAZOLE-TRIMETHOPRIM 800-160 MG PO TABS: 1.0000 | ORAL_TABLET | Freq: Every day | ORAL | 0 refills | Status: DC

## 2020-07-11 MED ORDER — SODIUM CHLORIDE 0.9 % IV SOLN: INTRAVENOUS | Status: DC

## 2020-07-11 SURGICAL SUPPLY — 29 items
BAG DRAIN CYSTO-URO LG1000N (MISCELLANEOUS) ×3 IMPLANT
BRUSH SCRUB EZ 1% IODOPHOR (MISCELLANEOUS) ×3 IMPLANT
CATH URETL 5X70 OPEN END (CATHETERS) IMPLANT
CNTNR SPEC 2.5X3XGRAD LEK (MISCELLANEOUS)
CONT SPEC 4OZ STER OR WHT (MISCELLANEOUS)
CONTAINER SPEC 2.5X3XGRAD LEK (MISCELLANEOUS) IMPLANT
DRAPE UTILITY 15X26 TOWEL STRL (DRAPES) ×3 IMPLANT
FIBER LASER TRAC TIP (UROLOGICAL SUPPLIES) IMPLANT
GLOVE BIOGEL PI IND STRL 7.5 (GLOVE) ×2 IMPLANT
GLOVE BIOGEL PI INDICATOR 7.5 (GLOVE) ×1
GOWN STRL REUS W/ TWL LRG LVL3 (GOWN DISPOSABLE) ×2 IMPLANT
GOWN STRL REUS W/ TWL XL LVL3 (GOWN DISPOSABLE) ×2 IMPLANT
GOWN STRL REUS W/TWL LRG LVL3 (GOWN DISPOSABLE) ×1
GOWN STRL REUS W/TWL XL LVL3 (GOWN DISPOSABLE) ×1
GUIDEWIRE STR DUAL SENSOR (WIRE) ×3 IMPLANT
INFUSOR MANOMETER BAG 3000ML (MISCELLANEOUS) ×3 IMPLANT
INTRODUCER DILATOR DOUBLE (INTRODUCER) IMPLANT
KIT TURNOVER CYSTO (KITS) ×3 IMPLANT
PACK CYSTO AR (MISCELLANEOUS) ×3 IMPLANT
SET CYSTO W/LG BORE CLAMP LF (SET/KITS/TRAYS/PACK) ×3 IMPLANT
SHEATH URETERAL 12FRX35CM (MISCELLANEOUS) IMPLANT
SOL .9 NS 3000ML IRR  AL (IV SOLUTION) ×1
SOL .9 NS 3000ML IRR UROMATIC (IV SOLUTION) ×2 IMPLANT
STENT URET 6FRX24 CONTOUR (STENTS) IMPLANT
STENT URET 6FRX26 CONTOUR (STENTS) IMPLANT
SURGILUBE 2OZ TUBE FLIPTOP (MISCELLANEOUS) ×3 IMPLANT
SYR 10ML LL (SYRINGE) ×3 IMPLANT
VALVE UROSEAL ADJ ENDO (VALVE) IMPLANT
WATER STERILE IRR 1000ML POUR (IV SOLUTION) ×3 IMPLANT

## 2020-07-11 NOTE — Anesthesia Preprocedure Evaluation (Signed)
Anesthesia Evaluation  Patient identified by MRN, date of birth, ID band Patient awake    Reviewed: Allergy & Precautions, H&P , NPO status , reviewed documented beta blocker date and time   Airway Mallampati: III  TM Distance: >3 FB Neck ROM: full    Dental  (+) Chipped   Pulmonary sleep apnea and Continuous Positive Airway Pressure Ventilation , pneumonia,  Education re GA & OSA done   Pulmonary exam normal        Cardiovascular hypertension, + CAD  Normal cardiovascular exam     Neuro/Psych PSYCHIATRIC DISORDERS Depression    GI/Hepatic GERD  Medicated and Controlled,  Endo/Other    Renal/GU Renal disease     Musculoskeletal  (+) Arthritis ,   Abdominal   Peds  Hematology   Anesthesia Other Findings Past Medical History: No date: Allergy No date: BPH (benign prostatic hyperplasia) No date: Coronary artery disease No date: GERD (gastroesophageal reflux disease) No date: History of kidney stones No date: Hyperlipidemia No date: Hypertension No date: Pneumonia No date: Renal disorder     Comment:  kidney stones No date: Sleep apnea     Comment:  CPAP Past Surgical History: 09/09/2016: CARDIAC CATHETERIZATION; Left     Comment:  Procedure: Left Heart Cath and Coronary Angiography;                Surgeon: Yolonda Kida, MD;  Location: [facility]               CV LAB;  Service: Cardiovascular;  Laterality: Left; 2013: COLONOSCOPY     Comment:  normal/ Dr Vira Agar- cleared for 10 yrs 01/03/2018: COLONOSCOPY WITH PROPOFOL; N/A     Comment:  Procedure: COLONOSCOPY WITH PROPOFOL;  Surgeon: Manya Silvas, MD;  Location: [facility] ENDOSCOPY;  Service:               Endoscopy;  Laterality: N/A; No date: CORONARY ARTERY BYPASS GRAFT 01/03/2018: ESOPHAGOGASTRODUODENOSCOPY (EGD) WITH PROPOFOL; N/A     Comment:  Procedure: ESOPHAGOGASTRODUODENOSCOPY (EGD) WITH               PROPOFOL;  Surgeon:  Manya Silvas, MD;  Location:               [facility] ENDOSCOPY;  Service: Endoscopy;  Laterality: N/A; No date: EYE SURGERY No date: HERNIA REPAIR 03/15/2018: IR NEPHROSTOMY PLACEMENT RIGHT No date: kidney stones     Comment:  lithotripsy No date: LITHOTRIPSY 03/15/2018: NEPHROLITHOTOMY; Right     Comment:  Procedure: NEPHROLITHOTOMY PERCUTANEOUS;  Surgeon:               Abbie Sons, MD;  Location: [facility] ORS;  Service:               Urology;  Laterality: Right; 2013: UPPER GI ENDOSCOPY BMI    Body Mass Index: 28.82 kg/m     Reproductive/Obstetrics                             Anesthesia Physical Anesthesia Plan  ASA: III  Anesthesia Plan: General   Post-op Pain Management:    Induction: Intravenous  PONV Risk Score and Plan: Ondansetron and Treatment may vary due to age or medical condition  Airway Management Planned: Oral ETT  Additional Equipment:   Intra-op Plan:   Post-operative Plan: Extubation in OR  Informed Consent: I have  reviewed the patients History and Physical, chart, labs and discussed the procedure including the risks, benefits and alternatives for the proposed anesthesia with the patient or authorized representative who has indicated his/her understanding and acceptance.     Dental Advisory Given  Plan Discussed with: CRNA  Anesthesia Plan Comments:         Anesthesia Quick Evaluation

## 2020-07-11 NOTE — H&P (View-Only) (Signed)
07/11/20 8:11 AM   Timothy Hatfield [birth year] [id number]  CC: nephrolithiasis  HPI: Patient with history of stone including right PCNL who was evaluated by our PA Samanthan Vailancourt and found to have two right ureteral stones and a large 1.7cm left renal pelvis stone on CT on 06/21/20, as well as multiple bladder stones. He passed multiple small stones and a repeat renal US showed no hydronephrosis and he was scheduled for left SWL for his large left renal stone that was causing intermittent flank pain.  On his KUB today both right ureteral stones appear to be clearly present. I suspect he passed some stones from the bladder that were mis-identified as the right ureteral stones, and no hydronephrosis was seen on [place] as he does not have hydro and continues to be asymptomatic. He denies any pain or fever today, continues to void yellow urine.   PMH: Past Medical History:  Diagnosis Date  . Allergy   . BPH (benign prostatic hyperplasia)   . Coronary artery disease   . GERD (gastroesophageal reflux disease)   . History of kidney stones   . Hyperlipidemia   . Hypertension   . Pneumonia   . Renal disorder    kidney stones  . Sleep apnea    CPAP    Surgical History: Past Surgical History:  Procedure Laterality Date  . CARDIAC CATHETERIZATION Left 09/09/2016   Procedure: Left Heart Cath and Coronary Angiography;  Surgeon: Yolonda Kida, MD;  Location: [place] [facility];  Service: Cardiovascular;  Laterality: Left;  . COLONOSCOPY  2013   normal/ Dr Vira Agar- cleared for 10 yrs  . COLONOSCOPY WITH PROPOFOL N/A 01/03/2018   Procedure: COLONOSCOPY WITH PROPOFOL;  Surgeon: Manya Silvas, MD;  Location: [facility] ENDOSCOPY;  Service: Endoscopy;  Laterality: N/A;  . CORONARY ARTERY BYPASS GRAFT    . ESOPHAGOGASTRODUODENOSCOPY (EGD) WITH PROPOFOL N/A 01/03/2018   Procedure: ESOPHAGOGASTRODUODENOSCOPY (EGD) WITH PROPOFOL;  Surgeon: Manya Silvas, MD;  Location: [facility] ENDOSCOPY;   Service: Endoscopy;  Laterality: N/A;  . EYE SURGERY    . HERNIA REPAIR    . IR NEPHROSTOMY PLACEMENT RIGHT  03/15/2018  . kidney stones     lithotripsy  . LITHOTRIPSY    . NEPHROLITHOTOMY Right 03/15/2018   Procedure: NEPHROLITHOTOMY PERCUTANEOUS;  Surgeon: Abbie Sons, MD;  Location: [facility] ORS;  Service: Urology;  Laterality: Right;  . UPPER GI ENDOSCOPY  2013     Family History: Family History  Problem Relation Age of Onset  . Arrhythmia Mother   . Coronary artery disease Father   . Diabetes Father   . Hyperlipidemia Brother     Social History:  reports that he has never smoked. He has never used smokeless tobacco. He reports that he does not drink alcohol and does not use drugs.  Physical Exam: BP 138/75   Pulse 78   Temp 97.8 F (36.6 C) (Temporal)   Resp 16   Wt 83.5 kg   SpO2 95%   BMI 28.82 kg/m    Constitutional:  Alert and oriented, No acute distress. Cardiovascular: RRR Lungs: CTA bilaterally  Laboratory Data: UA 06/25/20 no bacteria, 0-5 WBCS, nitrite negative, no leukocytes  Pertinent Imaging: I have personally reviewed the CT, renal US, and KUB today. Appears to have two large right mid and proximal ureteral stones, right lower poles stones, and left 1.7cm renal stone.  Assessment & Plan:   We discussed different options at length. Based on KUB today I believe his right  ureteral stones are still present, and remain asymptomatic. I recommended performing right ureteroscopy, laser lithotripsy, and stent placement, with left ureteral stent placement and plan for 2nd stage ureteroscopy in 2-3 weeks for large left renal stone burden.  We specifically discussed the risks ureteroscopy including bleeding, infection/sepsis, stent related symptoms including flank pain/urgency/frequency/incontinence/dysuria, ureteral injury, inability to access stone, or need for staged or additional procedures.  Cystoscopy, removal of any residual bladder stones, right  ureteroscopy/laser/stent, and left ureteral stent placement  Timothy Madrid, MD 07/11/2020  [facility] [address] [place], [place] [address] [phone]

## 2020-07-11 NOTE — Anesthesia Procedure Notes (Signed)
Procedure Name: Intubation Date/Time: 07/11/2020 9:27 AM Performed by: Louann Sjogren, CRNA Pre-anesthesia Checklist: Patient identified, Patient being monitored, Timeout performed, Emergency Drugs available and Suction available Patient Re-evaluated:Patient Re-evaluated prior to induction Oxygen Delivery Method: Circle system utilized Preoxygenation: Pre-oxygenation with 100% oxygen Induction Type: IV induction Ventilation: Mask ventilation without difficulty Laryngoscope Size: McGraph and 4 Grade View: Grade II Tube type: Oral Tube size: 7.5 mm Number of attempts: 1 Airway Equipment and Method: Stylet Placement Confirmation: ETT inserted through vocal cords under direct vision,  positive ETCO2 and breath sounds checked- equal and bilateral Secured at: 21 cm Tube secured with: Tape Dental Injury: Teeth and Oropharynx as per pre-operative assessment

## 2020-07-11 NOTE — Transfer of Care (Signed)
Immediate Anesthesia Transfer of Care Note  Patient: Timothy Hatfield  Procedure(s) Performed: CYSTOSCOPY/URETEROSCOPY/HOLMIUM LASER/STENT PLACEMENT (Bilateral ) CYSTOSCOPY WITH RETROGRADE PYELOGRAM (Bilateral ) BLADDER STONE EXTRACTION  Patient Location: PACU  Anesthesia Type:General  Level of Consciousness: awake and alert   Airway & Oxygen Therapy: Patient Spontanous Breathing  Post-op Assessment: Report given to RN and Post -op Vital signs reviewed and stable  Post vital signs: Reviewed and stable  Last Vitals:  Vitals Value Taken Time  BP 130/75 07/11/20 1051  Temp    Pulse 74 07/11/20 1051  Resp 16 07/11/20 1051  SpO2 98 % 07/11/20 1051  Vitals shown include unvalidated device data.  Last Pain:  Vitals:   07/11/20 0701  TempSrc: Temporal  PainSc: 0-No pain         Complications: No complications documented.

## 2020-07-11 NOTE — Discharge Instructions (Signed)
AMBULATORY SURGERY  DISCHARGE INSTRUCTIONS   1) The drugs that you were given will stay in your system until tomorrow so for the next 24 hours you should not:  A) Drive an automobile B) Make any legal decisions C) Drink any alcoholic beverage   2) You may resume regular meals tomorrow.  Today it is better to start with liquids and gradually work up to solid foods.  You may eat anything you prefer, but it is better to start with liquids, then soup and crackers, and gradually work up to solid foods.   3) Please notify your doctor immediately if you have any unusual bleeding, trouble breathing, redness and pain at the surgery site, drainage, fever, or pain not relieved by medication.    4) Additional Instr: Office will call you for follow up       Please contact your physician with any problems or Same Day Surgery at [phone], Monday through Friday 6 am to 4 pm, or [place] [facility] number at [phone].

## 2020-07-11 NOTE — H&P (Signed)
07/11/20 8:11 AM   Timothy Hatfield Jan 10, 1946 [id number]  CC: nephrolithiasis  HPI: Patient with history of stone including right PCNL who was evaluated by our PA Samanthan Vailancourt and found to have two right ureteral stones and a large 1.7cm left renal pelvis stone on CT on 06/21/20, as well as multiple bladder stones. He passed multiple small stones and a repeat renal US showed no hydronephrosis and he was scheduled for left SWL for his large left renal stone that was causing intermittent flank pain.  On his KUB today both right ureteral stones appear to be clearly present. I suspect he passed some stones from the bladder that were mis-identified as the right ureteral stones, and no hydronephrosis was seen on [place] as he does not have hydro and continues to be asymptomatic. He denies any pain or fever today, continues to void yellow urine.   PMH: Past Medical History:  Diagnosis Date  . Allergy   . BPH (benign prostatic hyperplasia)   . Coronary artery disease   . GERD (gastroesophageal reflux disease)   . History of kidney stones   . Hyperlipidemia   . Hypertension   . Pneumonia   . Renal disorder    kidney stones  . Sleep apnea    CPAP    Surgical History: Past Surgical History:  Procedure Laterality Date  . CARDIAC CATHETERIZATION Left 09/09/2016   Procedure: Left Heart Cath and Coronary Angiography;  Surgeon: Yolonda Kida, MD;  Location: [facility];  Service: Cardiovascular;  Laterality: Left;  . COLONOSCOPY  2013   normal/ Dr Vira Agar- cleared for 10 yrs  . COLONOSCOPY WITH PROPOFOL N/A 01/03/2018   Procedure: COLONOSCOPY WITH PROPOFOL;  Surgeon: Manya Silvas, MD;  [facility] END[facility]COPY;  Service: Endoscopy;  Laterality: N/A;  . CORONARY ARTERY BYPASS GRAFT    . ESOPHAGOGASTRODUODENOSCOPY (EGD) WITH PROPOFOL N/A 01/03/2018   Procedure: ESOPHAGOGASTRODUODENOSCOPY (EGD) WITH PROPOFOL;  Surgeon: Manya Silvas, MD;  Location: [facility] ENDOSCOPY;   Service: Endoscopy;  Laterality: N/A;  . EYE SURGERY    . HERNIA REPAIR    . IR NEPHROSTOMY PLACEMENT RIGHT  03/15/2018  . kidney stones     lithotripsy  . LITHOTRIPSY    . NEPHROLITHOTOMY Right 03/15/2018   Procedure: NEPHROLITHOTOMY PERCUTANEOUS;  Surgeon: Abbie Sons, MD;  Location: [facility] ORS;  Service: Urology;  Laterality: Right;  . UPPER GI ENDOSCOPY  2013     Family History: Family History  Problem Relation Age of Onset  . Arrhythmia Mother   . Coronary artery disease Father   . Diabetes Father   . Hyperlipidemia Brother     Social History:  reports that he has never smoked. He has never used smokeless tobacco. He reports that he does not drink alcohol and does not use drugs.  Physical Exam: BP 138/75   Pulse 78   Temp 97.8 F (36.6 C) (Temporal)   Resp 16   Wt 83.5 kg   SpO2 95%   BMI 28.82 kg/m    Constitutional:  Alert and oriented, No acute distress. Cardiovascular: RRR Lungs: CTA bilaterally  Laboratory Data: UA 06/25/20 no bacteria, 0-5 WBCS, nitrite negative, no leukocytes  Pertinent Imaging: I have personally reviewed the CT, renal US, and KUB today. Appears to have two large right mid and proximal ureteral stones, right lower poles stones, and left 1.7cm renal stone.  Assessment & Plan:   We discussed different options at length. Based on KUB today I believe his right  ureteral stones are still present, and remain asymptomatic. I recommended performing right ureteroscopy, laser lithotripsy, and stent placement, with left ureteral stent placement and plan for 2nd stage ureteroscopy in 2-3 weeks for large left renal stone burden.  We specifically discussed the risks ureteroscopy including bleeding, infection/sepsis, stent related symptoms including flank pain/urgency/frequency/incontinence/dysuria, ureteral injury, inability to access stone, or need for staged or additional procedures.  Cystoscopy, removal of any residual bladder stones, right  ureteroscopy/laser/stent, and left ureteral stent placement  Nickolas Madrid, MD 07/11/2020  [facility] [address] [place], [place] [address] [phone]

## 2020-07-11 NOTE — Anesthesia Postprocedure Evaluation (Signed)
Anesthesia Post Note  Patient: KOLTYN KELSAY  Procedure(s) Performed: CYSTOSCOPY/URETEROSCOPY/HOLMIUM LASER/STENT PLACEMENT (Bilateral ) CYSTOSCOPY WITH RETROGRADE PYELOGRAM (Bilateral ) BLADDER STONE EXTRACTION  Patient location during evaluation: PACU Anesthesia Type: General Level of consciousness: awake and alert Pain management: pain level controlled Vital Signs Assessment: post-procedure vital signs reviewed and stable Respiratory status: spontaneous breathing, nonlabored ventilation and respiratory function stable Cardiovascular status: blood pressure returned to baseline and stable Postop Assessment: no apparent nausea or vomiting Anesthetic complications: no   No complications documented.   Last Vitals:  Vitals:   07/11/20 1129 07/11/20 1140  BP: (!) 151/78 (!) 161/77  Pulse: 71 73  Resp: 14 14  Temp: 36.8 C (!) 36.2 C  SpO2: 97% 100%    Last Pain:  Vitals:   07/11/20 1140  TempSrc: Temporal  PainSc: 1                  Mahaila Tischer Harvie Heck

## 2020-07-11 NOTE — Op Note (Signed)
Date of procedure: 07/11/20   Preoperative diagnosis:  1. Bladder stones 2. Right ureteral stones x2 3. Right lower pole renal stones 4. Left renal stone  Postoperative diagnosis:  1. Same  Procedure: 1. Cystoscopy, evacuation of bladder stones 2. Bilateral retrograde pyelograms with intraoperative interpretation, bilateral ureteral stent placement 3. Right ureteroscopy, laser lithotripsy of both ureteral stones 4. Right ureteroscopy, laser lithotripsy of right lower pole renal stones  Surgeon: Nickolas Madrid, MD  Anesthesia: General  Complications: None  Intraoperative findings:  1.  Large prostate with high bladder neck, moderate bladder trabeculations, no suspicious bladder lesions 2.  Multiple small <1 cm bladder stones evacuated through the scope, ureteral orifices extremely close to the bladder neck and difficult to identify 3.  Uncomplicated laser lithotripsy of right ureteral stones and right renal stones 4.  Uncomplicated bilateral ureteral stent placement   EBL: Minimal  Specimens: None  Drains: Bilateral 6 [place] by 26 cm ureteral stents  Indication: Timothy Hatfield is a 74 y.o. patient with 2 right ureteral stones as well as a large 1.7 cm left renal stone who presents today for management of his right-sided renal stone burden and likely left stent placement with plan for repeat staged ureteroscopy on the left side for his larger left-sided stone.  After reviewing the management options for treatment, they elected to proceed with the above surgical procedure(s). We have discussed the potential benefits and risks of the procedure, side effects of the proposed treatment, the likelihood of the patient achieving the goals of the procedure, and any potential problems that might occur during the procedure or recuperation. Informed consent has been obtained.  Description of procedure:  The patient was taken to the operating room and general anesthesia was induced. SCDs  were placed for DVT prophylaxis. The patient was placed in the dorsal lithotomy position, prepped and draped in the usual sterile fashion, and preoperative antibiotics(Ancef) were administered. A preoperative time-out was performed.   A 21 French rigid cystoscope was used to intubate the urethra and a normal-appearing urethra was followed proximally to the bladder.  The prostate was very large with a high bladder neck.  There were moderate bladder trabeculations but no suspicious bladder lesions.  There were some small <1 cm bladder stones and these were evacuated through the scope out of the bladder.  The ureteral orifice ease were difficult to identify and extremely close to the bladder neck.  I started by advancing a sensor wire into the right distal ureter and this was advanced up to the kidney under fluoroscopic vision.  A semirigid ureteroscope was advanced alongside the wire and the distal ureteral stone that was 8 mm in siz was identified and fragmented to dust using the 200 m laser fiber on settings of 1.0 J and 10 Hz.  I could not reach the more proximal stone with the semirigid scope.  A second safety wire was added through the scope and the scope removed.  A single channel flexible ureteroscope was then advanced over the wire and identified a large stone in the proximal ureter.  This was also dusted using the previously mentioned settings.  The single-channel ureteroscope was then advanced up into the kidney and there were multiple stones in the lower pole, all measuring 4 to 5 mm in size.  The laser fiber was used to dust the stones to <1 mm fragments.  Thorough pyeloscopy revealed no residual stones.  Contrast was injected and showed no filling defects or extravasation.  Careful pullback ureteroscopy  revealed some small residual fragments and these were also fragmented to dust.  I opted to replace the semirigid ureteroscope to clear the distal ureter 1 more time, and multiple small pieces were  evacuated free from the ureter.  There was no ureteral injury or other abnormalities.  The rigid cystoscope was backloaded over the the wire and a 6 [place] by 26 cm ureteral stent was placed on the right uneventfully with an excellent curl in the renal pelvis, as well as in the bladder.  I then turned my attention to the left side and ultimately was able to pass a sensor wire into the left distal ureter and an access catheter was advanced over the wire.  A retrograde pyelogram showed no filling defects or hydronephrosis.  I suspect he has intermittent obstruction from his large left renal stone.  A sensor wire was advanced up into the kidney, and a 6 [place] by 26 cm ureteral stent was uneventfully placed on the left side.  The bladder was drained and this concluded our procedure.  Disposition: Stable to PACU  Plan: Follow-up in 1 to 2 weeks for second stage for management of left renal stone and right ureteral stent removal Consider HOLEP in the future with very large prostate with high bladder neck  Nickolas Madrid, MD

## 2020-07-12 ENCOUNTER — Encounter: Payer: Self-pay | Admitting: Urology

## 2020-07-15 ENCOUNTER — Other Ambulatory Visit: Payer: Medicare PPO

## 2020-07-15 ENCOUNTER — Other Ambulatory Visit: Payer: Self-pay

## 2020-07-15 DIAGNOSIS — N201 Calculus of ureter: Secondary | ICD-10-CM

## 2020-07-16 ENCOUNTER — Other Ambulatory Visit: Payer: Self-pay | Admitting: Urology

## 2020-07-16 ENCOUNTER — Other Ambulatory Visit: Payer: Self-pay

## 2020-07-16 ENCOUNTER — Other Ambulatory Visit: Payer: Medicare PPO

## 2020-07-16 DIAGNOSIS — N201 Calculus of ureter: Secondary | ICD-10-CM | POA: Diagnosis not present

## 2020-07-16 DIAGNOSIS — N200 Calculus of kidney: Secondary | ICD-10-CM

## 2020-07-17 LAB — MICROSCOPIC EXAMINATION
Bacteria, UA: NONE SEEN
RBC, Urine: >30 /hpf — AB (ref 0–2)

## 2020-07-17 LAB — URINALYSIS, COMPLETE
Bilirubin, UA: NEGATIVE
Ketones, UA: NEGATIVE
Nitrite, UA: NEGATIVE
Specific Gravity, UA: 1.025 (ref 1.005–1.030)
Urobilinogen, Ur: 1 mg/dL (ref 0.2–1.0)
pH, UA: 6 (ref 5.0–7.5)

## 2020-07-19 ENCOUNTER — Other Ambulatory Visit: Payer: Self-pay

## 2020-07-19 ENCOUNTER — Encounter
Admission: RE | Admit: 2020-07-19 | Discharge: 2020-07-19 | Disposition: A | Discharge status: Home / Self Care | Payer: Medicare PPO | Source: Ambulatory Visit | Attending: Urology | Admitting: Urology

## 2020-07-19 NOTE — Patient Instructions (Signed)
Your procedure is scheduled on: Friday 9/3 Report to Day Surgery. To find out your arrival time please call [phone] between 1PM - 3PM on Thurs 9/2 .  Remember: Instructions that are not followed completely may result in serious medical risk,  up to and including death, or upon the discretion of your surgeon and anesthesiologist your  surgery may need to be rescheduled.     _X__ 1. Do not eat food after midnight the night before your procedure.                 No chewing gum or hard candies. You may drink clear liquids up to 2 hours                 before you are scheduled to arrive for your surgery- DO not drink clear                 liquids within 2 hours of the start of your surgery.                 Clear Liquids include:  water, apple juice without pulp, clear Gatorade, G2 or                  Gatorade Zero (avoid Red/Purple/Blue), Black Coffee or Tea (Do not add                 anything to coffee or tea). _____2.   Complete the "Ensure Clear Pre-surgery Clear Carbohydrate Drink" provided to you, 2 hours before arrival. **If you       are diabetic you will be provided with an alternative drink, Gatorade Zero or G2.  __X__2.  On the morning of surgery brush your teeth with toothpaste and water, you                may rinse your mouth with mouthwash if you wish.  Do not swallow any toothpaste of mouthwash.     __ 3.  No Alcohol for 24 hours before or after surgery.   ___ 4.  Do Not Smoke or use e-cigarettes For 24 Hours Prior to Your Surgery.                 Do not use any chewable tobacco products for at least 6 hours prior to                 Surgery.  ___  5.  Do not use any recreational drugs (marijuana, cocaine, heroin, ecstasy, MDMA or other)                For at least one week prior to your surgery.  Combination of these drugs with anesthesia                May have life threatening results.  ____  6.  Bring all medications with you on the day of  surgery if instructed.   _x___  7.  Notify your doctor if there is any change in your medical condition      (cold, fever, infections).     Do not wear jewelry,  Do not wear lotions,  You may wear deodorant. Do not shave 48 hours prior to surgery. Men may shave face and neck. Do not bring valuables to the hospital.    [facility] is not responsible for any belongings or valuables.  Contacts, dentures or bridgework may not be worn into surgery. Leave your suitcase in the car. After surgery  it may be brought to your room. For patients admitted to the hospital, discharge time is determined by your treatment team.   Patients discharged the day of surgery will not be allowed to drive home.   Make arrangements for someone to be with you for the first 24 hours of your Same Day Discharge.    Please read over the following fact sheets that you were given:    __x__ Take these medicines the morning of surgery with A SIP OF WATER:    1. acetaminophen (TYLENOL) 500 MG tablet if needed  2. atorvastatin (LIPITOR) 80 MG tablet  3. dutasteride (AVODART) 0.5 MG capsule / tamsulosin (FLOMAX) 0.4 MG CAPS capsule  4.pantoprazole (PROTONIX) 40 MG tablet dose the night before and the morning of surgery  5.  6.  ____ Fleet Enema (as directed)   ____ Use CHG Soap (or wipes) as directed  ____ Use Benzoyl Peroxide Gel as instructed  ____ Use inhalers on the day of surgery  ____ Stop metformin 2 days prior to surgery    ____ Take 1/2 of usual insulin dose the night before surgery. No insulin the morning          of surgery.   _x___ Stopped aspirin already  _x___ Stop Anti-inflammatories  etodolac (LODINE) 400 MG tablet,   naproxen sodium (ALEVE) 220 MG tablet              May continue tylenol __x__ Stopped supplements already  Coenzyme Q10 (COQ-10) 100 MG CAPS,Misc Natural Products (GLUCOSAMINE CHOND DOUBLE STR PO)  ____ Bring C-Pap to the hospital.    If you have any questions regarding  your pre-procedure instructions,  Please call Pre-admit Testing at [place]

## 2020-07-22 LAB — CULTURE, URINE COMPREHENSIVE

## 2020-07-24 ENCOUNTER — Encounter
Admission: RE | Admit: 2020-07-24 | Discharge: 2020-07-24 | Disposition: A | Discharge status: Home / Self Care | Payer: Medicare PPO | Source: Ambulatory Visit | Attending: Urology | Admitting: Urology

## 2020-07-24 ENCOUNTER — Other Ambulatory Visit: Payer: Self-pay

## 2020-07-24 ENCOUNTER — Other Ambulatory Visit: Payer: Medicare PPO

## 2020-07-24 DIAGNOSIS — I10 Essential (primary) hypertension: Secondary | ICD-10-CM | POA: Diagnosis not present

## 2020-07-24 DIAGNOSIS — Z20822 Contact with and (suspected) exposure to covid-19: Secondary | ICD-10-CM | POA: Diagnosis not present

## 2020-07-24 DIAGNOSIS — I2510 Atherosclerotic heart disease of native coronary artery without angina pectoris: Secondary | ICD-10-CM | POA: Diagnosis not present

## 2020-07-24 DIAGNOSIS — Z01812 Encounter for preprocedural laboratory examination: Secondary | ICD-10-CM | POA: Diagnosis not present

## 2020-07-24 DIAGNOSIS — Z01810 Encounter for preprocedural cardiovascular examination: Secondary | ICD-10-CM | POA: Diagnosis not present

## 2020-07-24 LAB — BASIC METABOLIC PANEL
Anion gap: 11 (ref 5–15)
BUN: 25 mg/dL — ABNORMAL HIGH (ref 8–23)
CO2: 26 mmol/L (ref 22–32)
Calcium: 9.1 mg/dL (ref 8.9–10.3)
Chloride: 102 mmol/L (ref 98–111)
Creatinine, Ser: 1.58 mg/dL — ABNORMAL HIGH (ref 0.61–1.24)
GFR calc Af Amer: 49 mL/min — ABNORMAL LOW (ref >60)
GFR calc non Af Amer: 42 mL/min — ABNORMAL LOW (ref >60)
Glucose, Bld: 121 mg/dL — ABNORMAL HIGH (ref 70–99)
Potassium: 4 mmol/L (ref 3.5–5.1)
Sodium: 139 mmol/L (ref 135–145)

## 2020-07-24 LAB — CBC
HCT: 42.7 % (ref 39.0–52.0)
Hemoglobin: 14.6 g/dL (ref 13.0–17.0)
MCH: 32.4 pg (ref 26.0–34.0)
MCHC: 34.2 g/dL (ref 30.0–36.0)
MCV: 94.7 fL (ref 80.0–100.0)
Platelets: 261 10*3/uL (ref 150–400)
RBC: 4.51 MIL/uL (ref 4.22–5.81)
RDW: 12.9 % (ref 11.5–15.5)
WBC: 7 10*3/uL (ref 4.0–10.5)
nRBC: 0 % (ref 0.0–0.2)

## 2020-07-24 LAB — SARS CORONAVIRUS 2 (TAT 6-24 HRS): SARS Coronavirus 2: NEGATIVE

## 2020-07-25 ENCOUNTER — Ambulatory Visit: Payer: Medicare PPO | Admitting: Family Medicine

## 2020-07-26 ENCOUNTER — Encounter: Payer: Self-pay | Admitting: Urology

## 2020-07-26 ENCOUNTER — Ambulatory Visit: Payer: Medicare PPO

## 2020-07-26 ENCOUNTER — Ambulatory Visit: Payer: Medicare PPO | Admitting: Urgent Care

## 2020-07-26 ENCOUNTER — Other Ambulatory Visit: Payer: Self-pay

## 2020-07-26 ENCOUNTER — Encounter
Admission: RE | Disposition: A | Discharge status: Home / Self Care | Payer: Self-pay | Source: Home / Self Care | Attending: Urology

## 2020-07-26 ENCOUNTER — Ambulatory Visit
Admission: RE | Admit: 2020-07-26 | Discharge: 2020-07-26 | Disposition: A | Discharge status: Home / Self Care | Payer: Medicare PPO | Attending: Urology | Admitting: Urology

## 2020-07-26 DIAGNOSIS — Z8249 Family history of ischemic heart disease and other diseases of the circulatory system: Secondary | ICD-10-CM | POA: Diagnosis not present

## 2020-07-26 DIAGNOSIS — N400 Enlarged prostate without lower urinary tract symptoms: Secondary | ICD-10-CM | POA: Insufficient documentation

## 2020-07-26 DIAGNOSIS — I10 Essential (primary) hypertension: Secondary | ICD-10-CM | POA: Diagnosis not present

## 2020-07-26 DIAGNOSIS — Z466 Encounter for fitting and adjustment of urinary device: Secondary | ICD-10-CM | POA: Diagnosis not present

## 2020-07-26 DIAGNOSIS — N200 Calculus of kidney: Secondary | ICD-10-CM | POA: Insufficient documentation

## 2020-07-26 DIAGNOSIS — E785 Hyperlipidemia, unspecified: Secondary | ICD-10-CM | POA: Diagnosis not present

## 2020-07-26 DIAGNOSIS — G4730 Sleep apnea, unspecified: Secondary | ICD-10-CM | POA: Insufficient documentation

## 2020-07-26 DIAGNOSIS — Z951 Presence of aortocoronary bypass graft: Secondary | ICD-10-CM | POA: Insufficient documentation

## 2020-07-26 DIAGNOSIS — K219 Gastro-esophageal reflux disease without esophagitis: Secondary | ICD-10-CM | POA: Diagnosis not present

## 2020-07-26 DIAGNOSIS — I2510 Atherosclerotic heart disease of native coronary artery without angina pectoris: Secondary | ICD-10-CM | POA: Insufficient documentation

## 2020-07-26 HISTORY — PX: CYSTOSCOPY W/ URETERAL STENT REMOVAL: SHX1430

## 2020-07-26 HISTORY — PX: CYSTOSCOPY W/ RETROGRADES: SHX1426

## 2020-07-26 HISTORY — PX: CYSTOSCOPY/URETEROSCOPY/HOLMIUM LASER/STENT PLACEMENT: SHX6546

## 2020-07-26 SURGERY — CYSTOSCOPY/URETEROSCOPY/HOLMIUM LASER/STENT PLACEMENT
Anesthesia: General | Site: Ureter | Laterality: Right

## 2020-07-26 MED ORDER — ORAL CARE MOUTH RINSE: 15.0000 mL | Freq: Once | OROMUCOSAL | Status: AC

## 2020-07-26 MED ORDER — CHLORHEXIDINE GLUCONATE 0.12 % MT SOLN
OROMUCOSAL | Status: AC
  Administered 2020-07-26: 15 mL via OROMUCOSAL
  Filled 2020-07-26: qty 15

## 2020-07-26 MED ORDER — DEXAMETHASONE SODIUM PHOSPHATE 10 MG/ML IJ SOLN
INTRAMUSCULAR | Status: DC | PRN
  Administered 2020-07-26: 10 mg via INTRAVENOUS

## 2020-07-26 MED ORDER — CHLORHEXIDINE GLUCONATE 0.12 % MT SOLN: 15.0000 mL | Freq: Once | OROMUCOSAL | Status: AC

## 2020-07-26 MED ORDER — HYDROCODONE-ACETAMINOPHEN 5-325 MG PO TABS: 1.0000 | ORAL_TABLET | ORAL | 0 refills | Status: AC | PRN

## 2020-07-26 MED ORDER — PROPOFOL 10 MG/ML IV BOLUS
INTRAVENOUS | Status: DC | PRN
  Administered 2020-07-26: 50 mg via INTRAVENOUS
  Administered 2020-07-26: 100 mg via INTRAVENOUS

## 2020-07-26 MED ORDER — CEFAZOLIN SODIUM-DEXTROSE 2-4 GM/100ML-% IV SOLN
2.0000 g | INTRAVENOUS | Status: AC
  Administered 2020-07-26: 2 g via INTRAVENOUS

## 2020-07-26 MED ORDER — IOHEXOL 180 MG/ML  SOLN
INTRAMUSCULAR | Status: DC | PRN
  Administered 2020-07-26: 20 mL

## 2020-07-26 MED ORDER — PROPOFOL 10 MG/ML IV BOLUS
INTRAVENOUS | Status: AC
  Filled 2020-07-26: qty 60

## 2020-07-26 MED ORDER — ROCURONIUM BROMIDE 10 MG/ML (PF) SYRINGE
PREFILLED_SYRINGE | INTRAVENOUS | Status: AC
  Filled 2020-07-26: qty 10

## 2020-07-26 MED ORDER — CEFAZOLIN SODIUM-DEXTROSE 2-4 GM/100ML-% IV SOLN
INTRAVENOUS | Status: AC
  Filled 2020-07-26: qty 100

## 2020-07-26 MED ORDER — SUGAMMADEX SODIUM 200 MG/2ML IV SOLN
INTRAVENOUS | Status: DC | PRN
  Administered 2020-07-26: 200 mg via INTRAVENOUS

## 2020-07-26 MED ORDER — MIDAZOLAM HCL 2 MG/2ML IJ SOLN
INTRAMUSCULAR | Status: AC
  Filled 2020-07-26: qty 2

## 2020-07-26 MED ORDER — ONDANSETRON HCL 4 MG/2ML IJ SOLN
INTRAMUSCULAR | Status: AC
  Filled 2020-07-26: qty 6

## 2020-07-26 MED ORDER — ROCURONIUM BROMIDE 100 MG/10ML IV SOLN
INTRAVENOUS | Status: DC | PRN
  Administered 2020-07-26: 50 mg via INTRAVENOUS

## 2020-07-26 MED ORDER — FENTANYL CITRATE (PF) 100 MCG/2ML IJ SOLN
INTRAMUSCULAR | Status: DC | PRN
Start: 2020-07-26 — End: 2020-07-26
  Administered 2020-07-26: 25 ug via INTRAVENOUS
  Administered 2020-07-26: 50 ug via INTRAVENOUS

## 2020-07-26 MED ORDER — MIDAZOLAM HCL 2 MG/2ML IJ SOLN
INTRAMUSCULAR | Status: DC | PRN
  Administered 2020-07-26 (×2): 1 mg via INTRAVENOUS

## 2020-07-26 MED ORDER — SODIUM CHLORIDE 0.9 % IV SOLN
1.0000 g | INTRAVENOUS | Status: AC
  Administered 2020-07-26: 1 g via INTRAVENOUS
  Filled 2020-07-26: qty 1

## 2020-07-26 MED ORDER — BELLADONNA ALKALOIDS-OPIUM 16.2-60 MG RE SUPP
RECTAL | Status: AC
Start: 2020-07-26 — End: ?
  Filled 2020-07-26: qty 1

## 2020-07-26 MED ORDER — ONDANSETRON HCL 4 MG/2ML IJ SOLN
INTRAMUSCULAR | Status: AC
  Filled 2020-07-26: qty 2

## 2020-07-26 MED ORDER — OXYBUTYNIN CHLORIDE ER 10 MG PO TB24: 10.0000 mg | ORAL_TABLET | Freq: Every day | ORAL | 1 refills | Status: DC | PRN

## 2020-07-26 MED ORDER — ONDANSETRON HCL 4 MG/2ML IJ SOLN
INTRAMUSCULAR | Status: DC | PRN
  Administered 2020-07-26: 4 mg via INTRAVENOUS

## 2020-07-26 MED ORDER — HYDROCODONE-ACETAMINOPHEN 5-325 MG PO TABS: 1.0000 | ORAL_TABLET | Freq: Once | ORAL | Status: AC

## 2020-07-26 MED ORDER — FENTANYL CITRATE (PF) 100 MCG/2ML IJ SOLN
INTRAMUSCULAR | Status: AC
  Filled 2020-07-26: qty 2

## 2020-07-26 MED ORDER — LIDOCAINE HCL (PF) 2 % IJ SOLN
INTRAMUSCULAR | Status: AC
  Filled 2020-07-26: qty 5

## 2020-07-26 MED ORDER — FENTANYL CITRATE (PF) 100 MCG/2ML IJ SOLN: 25.0000 ug | INTRAMUSCULAR | Status: DC | PRN

## 2020-07-26 MED ORDER — ONDANSETRON HCL 4 MG/2ML IJ SOLN: 4.0000 mg | Freq: Once | INTRAMUSCULAR | Status: DC | PRN

## 2020-07-26 MED ORDER — LIDOCAINE HCL (CARDIAC) PF 100 MG/5ML IV SOSY
PREFILLED_SYRINGE | INTRAVENOUS | Status: DC | PRN
  Administered 2020-07-26: 100 mg via INTRAVENOUS

## 2020-07-26 MED ORDER — HYDROCODONE-ACETAMINOPHEN 5-325 MG PO TABS
ORAL_TABLET | ORAL | Status: AC
  Administered 2020-07-26: 1 via ORAL
  Filled 2020-07-26: qty 1

## 2020-07-26 MED ORDER — DEXAMETHASONE SODIUM PHOSPHATE 10 MG/ML IJ SOLN
INTRAMUSCULAR | Status: AC
  Filled 2020-07-26: qty 1

## 2020-07-26 MED ORDER — LACTATED RINGERS IV SOLN: INTRAVENOUS | Status: DC

## 2020-07-26 MED ORDER — BELLADONNA ALKALOIDS-OPIUM 16.2-60 MG RE SUPP
RECTAL | Status: DC | PRN
Start: 2020-07-26 — End: 2020-07-26
  Administered 2020-07-26: 1 via RECTAL

## 2020-07-26 MED ORDER — NEOSTIGMINE METHYLSULFATE 10 MG/10ML IV SOLN
INTRAVENOUS | Status: AC
  Filled 2020-07-26: qty 1

## 2020-07-26 SURGICAL SUPPLY — 32 items
BAG DRAIN CYSTO-URO LG1000N (MISCELLANEOUS) ×4 IMPLANT
BRUSH SCRUB EZ 1% IODOPHOR (MISCELLANEOUS) ×4 IMPLANT
CATH URETL 5X70 OPEN END (CATHETERS) IMPLANT
CNTNR SPEC 2.5X3XGRAD LEK (MISCELLANEOUS)
CONT SPEC 4OZ STER OR WHT (MISCELLANEOUS)
CONT SPEC 4OZ STRL OR WHT (MISCELLANEOUS)
CONTAINER SPEC 2.5X3XGRAD LEK (MISCELLANEOUS) IMPLANT
DRAPE UTILITY 15X26 TOWEL STRL (DRAPES) ×4 IMPLANT
FIBER LASER TRAC TIP (UROLOGICAL SUPPLIES) ×4 IMPLANT
GLOVE BIOGEL PI IND STRL 7.5 (GLOVE) ×3 IMPLANT
GLOVE BIOGEL PI INDICATOR 7.5 (GLOVE) ×1
GOWN STRL REUS W/ TWL LRG LVL3 (GOWN DISPOSABLE) ×3 IMPLANT
GOWN STRL REUS W/ TWL XL LVL3 (GOWN DISPOSABLE) ×3 IMPLANT
GOWN STRL REUS W/TWL LRG LVL3 (GOWN DISPOSABLE) ×4
GOWN STRL REUS W/TWL XL LVL3 (GOWN DISPOSABLE) ×4
GUIDEWIRE GREEN .038 145CM (MISCELLANEOUS) ×4 IMPLANT
GUIDEWIRE STR DUAL SENSOR (WIRE) ×8 IMPLANT
INFUSOR MANOMETER BAG 3000ML (MISCELLANEOUS) ×4 IMPLANT
INTRODUCER DILATOR DOUBLE (INTRODUCER) ×4 IMPLANT
KIT TURNOVER CYSTO (KITS) ×4 IMPLANT
PACK CYSTO AR (MISCELLANEOUS) ×4 IMPLANT
SET CYSTO W/LG BORE CLAMP LF (SET/KITS/TRAYS/PACK) ×4 IMPLANT
SHEATH URETERAL 12FRX35CM (MISCELLANEOUS) ×4 IMPLANT
SOL .9 NS 3000ML IRR  AL (IV SOLUTION) ×1
SOL .9 NS 3000ML IRR AL (IV SOLUTION) ×3
SOL .9 NS 3000ML IRR UROMATIC (IV SOLUTION) ×3 IMPLANT
STENT URET 6FRX24 CONTOUR (STENTS) IMPLANT
STENT URET 6FRX26 CONTOUR (STENTS) ×4 IMPLANT
SURGILUBE 2OZ TUBE FLIPTOP (MISCELLANEOUS) ×4 IMPLANT
SYR 10ML LL (SYRINGE) ×4 IMPLANT
VALVE UROSEAL ADJ ENDO (VALVE) ×4 IMPLANT
WATER STERILE IRR 1000ML POUR (IV SOLUTION) ×4 IMPLANT

## 2020-07-26 NOTE — OR Nursing (Signed)
May resume taking aspirin tomorrow 07/27/20 per Dr. Diamantina Providence, verbal.  Added to discharge instructions/med section.

## 2020-07-26 NOTE — Anesthesia Preprocedure Evaluation (Addendum)
Anesthesia Evaluation  Patient identified by MRN, date of birth, ID band Patient awake    Reviewed: Allergy & Precautions, H&P , NPO status , reviewed documented beta blocker date and time   Airway Mallampati: III  TM Distance: >3 FB Neck ROM: full    Dental  (+) Chipped   Pulmonary sleep apnea and Continuous Positive Airway Pressure Ventilation , pneumonia,  Education re GA & OSA done   Pulmonary exam normal        Cardiovascular hypertension, + CAD  Normal cardiovascular exam     Neuro/Psych PSYCHIATRIC DISORDERS Depression    GI/Hepatic GERD  Medicated and Controlled,  Endo/Other    Renal/GU Renal disease     Musculoskeletal  (+) Arthritis ,   Abdominal   Peds  Hematology   Anesthesia Other Findings Past Medical History: No date: Allergy No date: BPH (benign prostatic hyperplasia) No date: Coronary artery disease No date: GERD (gastroesophageal reflux disease) No date: History of kidney stones No date: Hyperlipidemia No date: Hypertension No date: Pneumonia No date: Renal disorder     Comment:  kidney stones No date: Sleep apnea     Comment:  CPAP Past Surgical History: 09/09/2016: CARDIAC CATHETERIZATION; Left     Comment:  Procedure: Left Heart Cath and Coronary Angiography;                Surgeon: Yolonda Kida, MD;  Location: [facility]               CV LAB;  Service: Cardiovascular;  Laterality: Left; 2013: COLONOSCOPY     Comment:  normal/ Dr Vira Agar- cleared for 10 yrs 01/03/2018: COLONOSCOPY WITH PROPOFOL; N/A     Comment:  Procedure: COLONOSCOPY WITH PROPOFOL;  Surgeon: Manya Silvas, MD;  Location: [facility] ENDOSCOPY;  Service:               Endoscopy;  Laterality: N/A; No date: CORONARY ARTERY BYPASS GRAFT 01/03/2018: ESOPHAGOGASTRODUODENOSCOPY (EGD) WITH PROPOFOL; N/A     Comment:  Procedure: ESOPHAGOGASTRODUODENOSCOPY (EGD) WITH               PROPOFOL;  Surgeon:  Manya Silvas, MD;  Location:               [facility] ENDOSCOPY;  Service: Endoscopy;  Laterality: N/A; No date: EYE SURGERY No date: HERNIA REPAIR 03/15/2018: IR NEPHROSTOMY PLACEMENT RIGHT No date: kidney stones     Comment:  lithotripsy No date: LITHOTRIPSY 03/15/2018: NEPHROLITHOTOMY; Right     Comment:  Procedure: NEPHROLITHOTOMY PERCUTANEOUS;  Surgeon:               Abbie Sons, MD;  Location: [facility] ORS;  Service:               Urology;  Laterality: Right; 2013: UPPER GI ENDOSCOPY BMI    Body Mass Index: 28.82 kg/m     Reproductive/Obstetrics                             Anesthesia Physical  Anesthesia Plan  ASA: III  Anesthesia Plan: General   Post-op Pain Management:    Induction: Intravenous  PONV Risk Score and Plan: Ondansetron and Treatment may vary due to age or medical condition  Airway Management Planned: Oral ETT  Additional Equipment:   Intra-op Plan:   Post-operative Plan: Extubation in OR  Informed Consent: I  have reviewed the patients History and Physical, chart, labs and discussed the procedure including the risks, benefits and alternatives for the proposed anesthesia with the patient or authorized representative who has indicated his/her understanding and acceptance.     Dental Advisory Given  Plan Discussed with: CRNA  Anesthesia Plan Comments:         Anesthesia Quick Evaluation

## 2020-07-26 NOTE — Op Note (Signed)
Date of procedure: 07/26/20  Preoperative diagnosis:  1. Right ureteral stent 2. Left renal stone  Postoperative diagnosis:  1. Same  Procedure: 1. Cystoscopy and right ureteral stent removal 2. Left ureteroscopy, laser lithotripsy, stent placement 3. Left retrograde pyelogram with intraoperative interpretation  Surgeon: Nickolas Madrid, MD  Anesthesia: General  Complications: None  Intraoperative findings:  1.  Large vascular prostate with high bladder neck 2.  Dusting of large 2 cm left renal stone 3.  Friable prostate 4.  Left ureteral stent placement  EBL: 10 cc  Specimens: None  Drains: Left six French by 26 cm ureteral stent  Indication: Timothy Hatfield is a 74 y.o. patient with bilateral stones here for second stage left ureteroscopy of a large 2 cm left renal stone.  After reviewing the management options for treatment, they elected to proceed with the above surgical procedure(s). We have discussed the potential benefits and risks of the procedure, side effects of the proposed treatment, the likelihood of the patient achieving the goals of the procedure, and any potential problems that might occur during the procedure or recuperation. Informed consent has been obtained.  Description of procedure:  The patient was taken to the operating room and general anesthesia was induced. SCDs were placed for DVT prophylaxis.. The patient was placed in the dorsal lithotomy position, prepped and draped in the usual sterile fashion, and preoperative antibiotics(cefepime) were administered. A preoperative time-out was performed.   A 21 French rigid cystoscope was used to intubate the urethra and a normal-appearing urethra was followed proximally to the bladder.  The prostate was very large with a high bladder neck and was quite vascular.  The right ureteral stent was grasped and removed.  I then reentered the bladder and the left ureteral stent was grasped and pulled to the meatus.  A  sensor wire was passed through the stent up into the kidney under fluoroscopic vision.  A dual-lumen access catheter was used to add a second safety sensor wire.  A 12/14 French ureteral access sheath was gently advanced over the wire to the proximal ureter.  The ureter was quite tight.  The single-channel digital flexible ureteroscope was advanced through the sheath up into the kidney and thorough pyeloscopy was performed.  There was a 5 mm stone in the midpole, and a 2 cm stone in the renal pelvis.  A 200 m laser fiber on settings of 0.5 J and 40 Hz was used to methodically fragment the stones to dust.  Contrast was injected through the scope from the proximal ureter and showed no extravasation or filling defects.  Thorough pyeloscopy revealed no residual stones at the conclusion of the procedure.  The sheath was backed out, and careful pullback ureteroscopy demonstrated no ureteral injury or residual fragments.  The rigid scope was backloaded over the wire and I attempted to place a left six [place] by 26 cm ureteral stent.  There was some resistance and when the wire was removed the curl appeared to remain in the proximal ureter.  I reentered the bladder and majority of the stent was located in the bladder.  I attempted to pass a sensor wire alongside the stent but could not cannulate the ureteral orifice.  The stent was grasped and pulled to the meatus to attempt a wire the stent, however the stent had already been removed from the distal left ureter.  I then re-entered the bladder with the rigid cystoscope and was able to identify the left ureteral orifice.  Retrograde  pyelogram showed narrowing of the left ureter but contrast opacified the collecting system and there is no extravasation.  I passed a Super Stiff wire up into the kidney, and the access catheter was advanced over the wire and another retrograde pyelogram was performed proximally which showed no ureteral injury or extravasation.  The access  catheter was removed and a six [place] by 26 cm ureteral stent was uneventfully placed over the Super Stiff wire with an excellent curl in the renal pelvis, as well as in the bladder.  The bladder was drained.  Urine was pink to light red, but no clots.  A belladonna suppository was placed  Disposition: Stable to PACU  Plan: Stent removal in 2 to 3 weeks in clinic Will need follow-up renal ultrasound in 4 to 6 weeks to evaluate for silent hydronephrosis Will need 32-QVOH urine metabolic work-up  Nickolas Madrid, MD

## 2020-07-26 NOTE — Discharge Instructions (Signed)

## 2020-07-26 NOTE — Transfer of Care (Signed)
Immediate Anesthesia Transfer of Care Note  Patient: Timothy Hatfield  Procedure(s) Performed: CYSTOSCOPY/URETEROSCOPY/HOLMIUM LASER/STENT EXCHANGE (Left Ureter) CYSTOSCOPY WITH STENT REMOVAL (Right Ureter) CYSTOSCOPY WITH RETROGRADE PYELOGRAM (Ureter)  Patient Location: PACU  Anesthesia Type:General  Level of Consciousness: drowsy  Airway & Oxygen Therapy: Patient Spontanous Breathing and Patient connected to face mask oxygen  Post-op Assessment: Report given to RN and Post -op Vital signs reviewed and stable  Post vital signs: Reviewed and stable  Last Vitals:  Vitals Value Taken Time  BP 155/76 07/26/20 1048  Temp 36.1 C 07/26/20 1048  Pulse 75 07/26/20 1052  Resp 12 07/26/20 1052  SpO2 100 % 07/26/20 1052  Vitals shown include unvalidated device data.  Last Pain:  Vitals:   07/26/20 1048  TempSrc:   PainSc: Asleep         Complications: No complications documented.

## 2020-07-26 NOTE — Anesthesia Procedure Notes (Signed)
Procedure Name: Intubation Date/Time: 07/26/2020 9:27 AM Performed by: Levert Feinstein, RN Pre-anesthesia Checklist: Patient identified, Patient being monitored, Timeout performed, Emergency Drugs available and Suction available Patient Re-evaluated:Patient Re-evaluated prior to induction Oxygen Delivery Method: Circle system utilized Preoxygenation: Pre-oxygenation with 100% oxygen Induction Type: IV induction Ventilation: Mask ventilation without difficulty Laryngoscope Size: 3, McGraph and 4 Grade View: Grade I Tube type: Oral Tube size: 7.5 mm Number of attempts: 1 Airway Equipment and Method: Stylet Placement Confirmation: ETT inserted through vocal cords under direct vision,  positive ETCO2 and breath sounds checked- equal and bilateral Secured at: 22 cm Tube secured with: Tape Dental Injury: Teeth and Oropharynx as per pre-operative assessment

## 2020-07-26 NOTE — Interval H&P Note (Signed)
UROLOGY H&P UPDATE  Agree with prior H&P dated 07/11/2020.  74 year old male who presented with right ureteral stones and large left renal stone with intermittent left renal colic and previously underwent right ureteroscopy and laser lithotripsy of right-sided stone burden, as well as left ureteral stent placement.  Presents today for right ureteral stent removal, and left ureteroscopy/laser/stent for his large left renal pelvis stone.  Cardiac: RRR Lungs: CTA bilaterally  Procedure: Left ureteroscopy, laser lithotripsy, stent exchange, right stent removal  Urine: Contaminated with 2k colonies of Pseudomonas  We specifically discussed the risks ureteroscopy including bleeding, infection/sepsis, stent related symptoms including flank pain/urgency/frequency/incontinence/dysuria, ureteral injury, inability to access stone, or need for staged or additional procedures.   Billey Co, MD 07/26/2020

## 2020-07-28 NOTE — Anesthesia Postprocedure Evaluation (Signed)
Anesthesia Post Note  Patient: Timothy Hatfield  Procedure(s) Performed: CYSTOSCOPY/URETEROSCOPY/HOLMIUM LASER/STENT EXCHANGE (Left Ureter) CYSTOSCOPY WITH STENT REMOVAL (Right Ureter) CYSTOSCOPY WITH RETROGRADE PYELOGRAM (Ureter)  Patient location during evaluation: PACU Anesthesia Type: General Level of consciousness: awake and alert and oriented Pain management: pain level controlled Vital Signs Assessment: post-procedure vital signs reviewed and stable Respiratory status: spontaneous breathing Cardiovascular status: blood pressure returned to baseline Anesthetic complications: no   No complications documented.   Last Vitals:  Vitals:   07/26/20 1119 07/26/20 1145  BP: (!) 161/81 (!) 164/81  Pulse: 76 79  Resp: 13 16  Temp: (!) 36.1 C (!) 36.2 C  SpO2: 99% 100%    Last Pain:  Vitals:   07/26/20 1145  TempSrc: Temporal  PainSc: 4                  [place]

## 2020-07-29 DIAGNOSIS — G4733 Obstructive sleep apnea (adult) (pediatric): Secondary | ICD-10-CM | POA: Diagnosis not present

## 2020-08-05 ENCOUNTER — Other Ambulatory Visit: Payer: Self-pay | Admitting: Urology

## 2020-08-07 ENCOUNTER — Other Ambulatory Visit: Payer: Self-pay

## 2020-08-07 MED ORDER — DUTASTERIDE-TAMSULOSIN HCL 0.5-0.4 MG PO CAPS
1.0000 | ORAL_CAPSULE | Freq: Every day | ORAL | 11 refills | Status: DC
Start: 2020-08-07 — End: 2021-07-17

## 2020-08-08 ENCOUNTER — Ambulatory Visit: Payer: Medicare PPO | Admitting: Physician Assistant

## 2020-08-14 ENCOUNTER — Encounter: Payer: Self-pay | Admitting: Urology

## 2020-08-14 ENCOUNTER — Ambulatory Visit (INDEPENDENT_AMBULATORY_CARE_PROVIDER_SITE_OTHER): Payer: Medicare PPO | Admitting: Urology

## 2020-08-14 ENCOUNTER — Other Ambulatory Visit: Payer: Self-pay

## 2020-08-14 VITALS — BP 182/80 | HR 84 | Ht 67.0 in | Wt 190.0 lb

## 2020-08-14 DIAGNOSIS — N200 Calculus of kidney: Secondary | ICD-10-CM

## 2020-08-14 DIAGNOSIS — Z466 Encounter for fitting and adjustment of urinary device: Secondary | ICD-10-CM

## 2020-08-14 MED ORDER — SULFAMETHOXAZOLE-TRIMETHOPRIM 800-160 MG PO TABS
1.0000 | ORAL_TABLET | Freq: Once | ORAL | Status: AC
  Administered 2020-08-14: 1 via ORAL

## 2020-08-14 MED ORDER — LIDOCAINE HCL URETHRAL/MUCOSAL 2 % EX GEL
1.0000 "application " | Freq: Once | CUTANEOUS | Status: AC
  Administered 2020-08-14: 1 via URETHRAL

## 2020-08-14 NOTE — Progress Notes (Signed)
Cystoscopy Procedure Note:  Indication: Stent removal s/p left URS/LL/stent   After informed consent and discussion of the procedure and its risks, Timothy Hatfield was positioned and prepped in the standard fashion. Cystoscopy was performed with a flexible cystoscope. The stent was grasped with flexible graspers and removed in its entirety. The patient tolerated the procedure well.  Findings: Uncomplicated stent removal  Assessment and Plan: Stone analysis has been 50% CaOx and 50% uric acid previously- he reports he has done 2 prior 24 hour urine tests, both only showed low urine volume.  Follow up in 4 weeks with renal ultrasound to evaluate for silent hydronephrosis Consider potassium citrate in the future if recurrent stones  Billey Co, MD 08/14/2020

## 2020-08-14 NOTE — Addendum Note (Signed)
Addended by: Donalee Citrin on: 08/14/2020 02:07 PM   Modules accepted: Orders

## 2020-08-14 NOTE — Patient Instructions (Addendum)
Dietary Guidelines to Help Prevent Kidney Stones Kidney stones are deposits of minerals and salts that form inside your kidneys. Your risk of developing kidney stones may be greater depending on your diet, your lifestyle, the medicines you take, and whether you have certain medical conditions. Most people can reduce their chances of developing kidney stones by following the instructions below. Depending on your overall health and the type of kidney stones you tend to develop, your dietitian may give you more specific instructions. What are tips for following this plan? Reading food labels  Choose foods with "no salt added" or "low-salt" labels. Limit your sodium intake to less than 1500 mg per day.  Choose foods with calcium for each meal and snack. Try to eat about 300 mg of calcium at each meal. Foods that contain 200-500 mg of calcium per serving include: ? 8 oz (237 ml) of milk, fortified nondairy milk, and fortified fruit juice. ? 8 oz (237 ml) of kefir, yogurt, and soy yogurt. ? 4 oz (118 ml) of tofu. ? 1 oz of cheese. ? 1 cup (300 g) of dried figs. ? 1 cup (91 g) of cooked broccoli. ? 1-3 oz can of sardines or mackerel.  Most people need 1000 to 1500 mg of calcium each day. Talk to your dietitian about how much calcium is recommended for you. Shopping  Buy plenty of fresh fruits and vegetables. Most people do not need to avoid fruits and vegetables, even if they contain nutrients that may contribute to kidney stones.  When shopping for convenience foods, choose: ? Whole pieces of fruit. ? Premade salads with dressing on the side. ? Low-fat fruit and yogurt smoothies.  Avoid buying frozen meals or prepared deli foods.  Look for foods with live cultures, such as yogurt and kefir. Cooking  Do not add salt to food when cooking. Place a salt shaker on the table and allow each person to add his or her own salt to taste.  Use vegetable protein, such as beans, textured vegetable  protein (TVP), or tofu instead of meat in pasta, casseroles, and soups. Meal planning   Eat less salt, if told by your dietitian. To do this: ? Avoid eating processed or premade food. ? Avoid eating fast food.  Eat less animal protein, including cheese, meat, poultry, or fish, if told by your dietitian. To do this: ? Limit the number of times you have meat, poultry, fish, or cheese each week. Eat a diet free of meat at least 2 days a week. ? Eat only one serving each day of meat, poultry, fish, or seafood. ? When you prepare animal protein, cut pieces into small portion sizes. For most meat and fish, one serving is about the size of one deck of cards.  Eat at least 5 servings of fresh fruits and vegetables each day. To do this: ? Keep fruits and vegetables on hand for snacks. ? Eat 1 piece of fruit or a handful of berries with breakfast. ? Have a salad and fruit at lunch. ? Have two kinds of vegetables at dinner.  Limit foods that are high in a substance called oxalate. These include: ? Spinach. ? Rhubarb. ? Beets. ? Potato chips and french fries. ? Nuts.  If you regularly take a diuretic medicine, make sure to eat at least 1-2 fruits or vegetables high in potassium each day. These include: ? Avocado. ? Banana. ? Orange, prune, carrot, or tomato juice. ? Baked potato. ? Cabbage. ? Beans and split   peas. General instructions   Drink enough fluid to keep your urine clear or pale yellow. This is the most important thing you can do.  Talk to your health care provider and dietitian about taking daily supplements. Depending on your health and the cause of your kidney stones, you may be advised: ? Not to take supplements with vitamin C. ? To take a calcium supplement. ? To take a daily probiotic supplement. ? To take other supplements such as magnesium, fish oil, or vitamin B6.  Take all medicines and supplements as told by your health care provider.  Limit alcohol intake to no  more than 1 drink a day for nonpregnant women and 2 drinks a day for men. One drink equals 12 oz of beer, 5 oz of wine, or 1 oz of hard liquor.  Lose weight if told by your health care provider. Work with your dietitian to find strategies and an eating plan that works best for you. What foods are not recommended? Limit your intake of the following foods, or as told by your dietitian. Talk to your dietitian about specific foods you should avoid based on the type of kidney stones and your overall health. Grains Breads. Bagels. Rolls. Baked goods. Salted crackers. Cereal. Pasta. Vegetables Spinach. Rhubarb. Beets. Canned vegetables. Angie Fava. Olives. Meats and other protein foods Nuts. Nut butters. Large portions of meat, poultry, or fish. Salted or cured meats. Deli meats. Hot dogs. Sausages. Dairy Cheese. Beverages Regular soft drinks. Regular vegetable juice. Seasonings and other foods Seasoning blends with salt. Salad dressings. Canned soups. Soy sauce. Ketchup. Barbecue sauce. Canned pasta sauce. Casseroles. Pizza. Lasagna. Frozen meals. Potato chips. [place] fries. Summary  You can reduce your risk of kidney stones by making changes to your diet.  The most important thing you can do is drink enough fluid. You should drink enough fluid to keep your urine clear or pale yellow.  Ask your health care provider or dietitian how much protein from animal sources you should eat each day, and also how much salt and calcium you should have each day. This information is not intended to replace advice given to you by your health care provider. Make sure you discuss any questions you have with your health care provider. Document Revised: 03/01/2019 Document Reviewed: 10/20/2016 Elsevier Patient Education  [place]. Ureteral Stent Implantation, Care After This sheet gives you information about how to care for yourself after your procedure. Your health care provider may also give you more  specific instructions. If you have problems or questions, contact your health care provider. What can I expect after the procedure? After the procedure, it is common to have:  Nausea.  Mild pain when you urinate. You may feel this pain in your lower back or lower abdomen. The pain should stop within a few minutes after you urinate. This may last for up to 1 week.  A small amount of blood in your urine for several days. Follow these instructions at home: Medicines  Take over-the-counter and prescription medicines only as told by your health care provider.  If you were prescribed an antibiotic medicine, take it as told by your health care provider. Do not stop taking the antibiotic even if you start to feel better.  Do not drive for 24 hours if you were given a sedative during your procedure.  Ask your health care provider if the medicine prescribed to you requires you to avoid driving or using heavy machinery. Activity  Rest as told by  your health care provider.  Avoid sitting for a long time without moving. Get up to take short walks every 1-2 hours. This is important to improve blood flow and breathing. Ask for help if you feel weak or unsteady.  Return to your normal activities as told by your health care provider. Ask your health care provider what activities are safe for you. General instructions   Watch for any blood in your urine. Call your health care provider if the amount of blood in your urine increases.  If you have a catheter: ? Follow instructions from your health care provider about taking care of your catheter and collection bag. ? Do not take baths, swim, or use a hot tub until your health care provider approves. Ask your health care provider if you may take showers. You may only be allowed to take sponge baths.  Drink enough fluid to keep your urine pale yellow.  Do not use any products that contain nicotine or tobacco, such as cigarettes, e-cigarettes, and  chewing tobacco. These can delay healing after surgery. If you need help quitting, ask your health care provider.  Keep all follow-up visits as told by your health care provider. This is important. Contact a health care provider if:  You have pain that gets worse or does not get better with medicine, especially pain when you urinate.  You have difficulty urinating.  You feel nauseous or you vomit repeatedly during a period of more than 2 days after the procedure. Get help right away if:  Your urine is dark red or has blood clots in it.  You are leaking urine (have incontinence).  The end of the stent comes out of your urethra.  You cannot urinate.  You have sudden, sharp, or severe pain in your abdomen or lower back.  You have a fever.  You have swelling or pain in your legs.  You have difficulty breathing. Summary  After the procedure, it is common to have mild pain when you urinate that goes away within a few minutes after you urinate. This may last for up to 1 week.  Watch for any blood in your urine. Call your health care provider if the amount of blood in your urine increases.  Take over-the-counter and prescription medicines only as told by your health care provider.  Drink enough fluid to keep your urine pale yellow. This information is not intended to replace advice given to you by your health care provider. Make sure you discuss any questions you have with your health care provider. Document Revised: 08/16/2018 Document Reviewed: 08/17/2018 Elsevier Patient Education  2020 [employer].

## 2020-08-15 LAB — URINALYSIS, COMPLETE
Bilirubin, UA: NEGATIVE
Glucose, UA: NEGATIVE
Ketones, UA: NEGATIVE
Nitrite, UA: NEGATIVE
Specific Gravity, UA: 1.015 (ref 1.005–1.030)
Urobilinogen, Ur: 0.2 mg/dL (ref 0.2–1.0)
pH, UA: 5.5 (ref 5.0–7.5)

## 2020-08-15 LAB — MICROSCOPIC EXAMINATION: RBC, Urine: >30 /hpf — AB (ref 0–2)

## 2020-08-28 DIAGNOSIS — G4733 Obstructive sleep apnea (adult) (pediatric): Secondary | ICD-10-CM | POA: Diagnosis not present

## 2020-08-28 DIAGNOSIS — H26491 Other secondary cataract, right eye: Secondary | ICD-10-CM | POA: Diagnosis not present

## 2020-09-02 ENCOUNTER — Telehealth: Payer: Self-pay | Admitting: Family Medicine

## 2020-09-02 NOTE — Telephone Encounter (Signed)
Left message for patient to call back and schedule Medicare Annual Wellness Visit (AWV) either virtually/audio only or in office. Whichever the patients preference is.  No history of AWV; please schedule at anytime with [facility] Health Advisor.  This should be a 40 minute visit  AWV-I PER [place] AS OF 09/24/11

## 2020-09-05 ENCOUNTER — Other Ambulatory Visit: Payer: Self-pay

## 2020-09-05 ENCOUNTER — Encounter: Payer: Self-pay | Admitting: Family Medicine

## 2020-09-05 ENCOUNTER — Ambulatory Visit: Payer: Medicare PPO | Admitting: Family Medicine

## 2020-09-05 VITALS — BP 132/78 | HR 64 | Ht 67.0 in | Wt 186.0 lb

## 2020-09-05 DIAGNOSIS — E782 Mixed hyperlipidemia: Secondary | ICD-10-CM

## 2020-09-05 DIAGNOSIS — I10 Essential (primary) hypertension: Secondary | ICD-10-CM

## 2020-09-05 DIAGNOSIS — K2270 Barrett's esophagus without dysplasia: Secondary | ICD-10-CM

## 2020-09-05 DIAGNOSIS — Z23 Encounter for immunization: Secondary | ICD-10-CM

## 2020-09-05 MED ORDER — ATORVASTATIN CALCIUM 80 MG PO TABS: 80.0000 mg | ORAL_TABLET | Freq: Every day | ORAL | 1 refills | Status: DC

## 2020-09-05 MED ORDER — PANTOPRAZOLE SODIUM 40 MG PO TBEC: 40.0000 mg | DELAYED_RELEASE_TABLET | Freq: Every day | ORAL | 1 refills | Status: DC

## 2020-09-05 MED ORDER — BENAZEPRIL-HYDROCHLOROTHIAZIDE 10-12.5 MG PO TABS: 1.0000 | ORAL_TABLET | Freq: Every day | ORAL | 1 refills | Status: DC

## 2020-09-05 MED ORDER — METOPROLOL SUCCINATE ER 25 MG PO TB24: 25.0000 mg | ORAL_TABLET | Freq: Every day | ORAL | 1 refills | Status: DC

## 2020-09-05 NOTE — Progress Notes (Signed)
Date:  09/05/2020   Name:  Timothy Hatfield   DOB:  05/02/1946   MRN:  [id number]   Chief Complaint: Hyperlipidemia, Gastroesophageal Reflux, Hypertension, and Flu Vaccine  Hyperlipidemia This is a chronic problem. The current episode started more than 1 year ago. The problem is controlled. Recent lipid tests were reviewed and are normal. He has no history of chronic renal disease, diabetes, hypothyroidism, liver disease, obesity or nephrotic syndrome. Pertinent negatives include no chest pain, focal sensory loss, focal weakness, leg pain, myalgias or shortness of breath. Current antihyperlipidemic treatment includes statins. The current treatment provides moderate improvement of lipids. There are no compliance problems.  Risk factors for coronary artery disease include dyslipidemia and hypertension.  Gastroesophageal Reflux He reports no abdominal pain, no belching, no chest pain, no choking, no coughing, no dysphagia, no early satiety, no globus sensation, no heartburn, no hoarse voice, no nausea, no sore throat, no stridor, no water brash or no wheezing. This is a chronic problem. The current episode started more than 1 year ago. The problem occurs occasionally. The problem has been gradually improving. The symptoms are aggravated by certain foods. Pertinent negatives include no anemia, fatigue, melena, muscle weakness, orthopnea or weight loss. He has tried a PPI for the symptoms. The treatment provided moderate relief. Past procedures do not include an abdominal ultrasound, an EGD, esophageal manometry, esophageal pH monitoring, H. pylori antibody titer or a UGI.  Hypertension This is a chronic problem. The current episode started more than 1 year ago. The problem has been gradually improving since onset. The problem is controlled. Pertinent negatives include no anxiety, blurred vision, chest pain, headaches, malaise/fatigue, neck pain, orthopnea, palpitations, peripheral edema, PND, shortness  of breath or sweats. There are no associated agents to hypertension. Risk factors for coronary artery disease include dyslipidemia. Past treatments include ACE inhibitors, diuretics and beta blockers. The current treatment provides moderate improvement. There are no compliance problems.  There is no history of angina, kidney disease, CAD/MI, CVA, heart failure, left ventricular hypertrophy, PVD or retinopathy. There is no history of chronic renal disease, a hypertension causing med or renovascular disease.    Lab Results  Component Value Date   CREATININE 1.58 (H) 07/24/2020   BUN 25 (H) 07/24/2020   NA 139 07/24/2020   K 4.0 07/24/2020   CL 102 07/24/2020   CO2 26 07/24/2020   Lab Results  Component Value Date   CHOL 122 08/07/2019   HDL 52 08/07/2019   LDLCALC 49 08/07/2019   TRIG 115 08/07/2019   CHOLHDL 2.6 10/03/2018   No results found for: TSH No results found for: HGBA1C Lab Results  Component Value Date   WBC 7.0 07/24/2020   HGB 14.6 07/24/2020   HCT 42.7 07/24/2020   MCV 94.7 07/24/2020   PLT 261 07/24/2020   Lab Results  Component Value Date   ALT 24 03/27/2020   AST 25 03/27/2020   ALKPHOS 64 03/27/2020   BILITOT 0.9 03/27/2020     Review of Systems  Constitutional: Negative for fatigue, malaise/fatigue and weight loss.  HENT: Negative for hoarse voice and sore throat.   Eyes: Negative for blurred vision.  Respiratory: Negative for cough, choking, shortness of breath and wheezing.   Cardiovascular: Negative for chest pain, palpitations, orthopnea and PND.  Gastrointestinal: Negative for abdominal pain, dysphagia, heartburn, melena and nausea.  Musculoskeletal: Negative for myalgias, muscle weakness and neck pain.  Neurological: Negative for focal weakness and headaches.    Patient Active  Problem List   Diagnosis Date Noted  . Benign prostatic hyperplasia with lower urinary tract symptoms 01/12/2020  . GERD without esophagitis 03/25/2018  . Moderate  episode of recurrent major depressive disorder ([place]) 03/25/2018  . Acute calcific periarthritis 06/18/2015  . Allergic rhinitis 06/18/2015  . Arthritis 06/18/2015  . Essential (primary) hypertension 06/18/2015  . 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 HLD (hyperlipidemia) 06/18/2015  . External hemorrhoid 06/18/2015  . Gastroenteritis 06/18/2015  . Bilateral nephrolithiasis 05/15/2014    Allergies  Allergen Reactions  . Bee Venom Anaphylaxis  . Ciprofloxacin Itching    Past Surgical History:  Procedure Laterality Date  . CARDIAC CATHETERIZATION Left 09/09/2016   Procedure: Left Heart Cath and Coronary Angiography;  Surgeon: Yolonda Kida, MD;  Location: [facility];  Service: Cardiovascular;  Laterality: Left;  . COLONOSCOPY  2013   normal/ Dr Vira Agar- cleared for 10 yrs  . COLONOSCOPY WITH PROPOFOL N/A 01/03/2018   Procedure: COLONOSCOPY WITH PROPOFOL;  Surgeon: Manya Silvas, MD;  Location: [facility];  Service: Endoscopy;  Laterality: N/A;  . CORONARY ARTERY BYPASS GRAFT    . CYSTOSCOPY W/ RETROGRADES Bilateral 07/11/2020   Procedure: CYSTOSCOPY WITH RETROGRADE PYELOGRAM;  Surgeon: Billey Co, MD;  Location: [facility];  Service: Urology;  Laterality: Bilateral;  . CYSTOSCOPY W/ RETROGRADES  07/26/2020   Procedure: CYSTOSCOPY WITH RETROGRADE PYELOGRAM;  Surgeon: Billey Co, MD;  Location: [facility];  Service: Urology;;  . Consuela Mimes W/ URETERAL STENT REMOVAL Right 07/26/2020   Procedure: CYSTOSCOPY WITH STENT REMOVAL;  Surgeon: Billey Co, MD;  Location: [facility];  Service: Urology;  Laterality: Right;  . CYSTOSCOPY/URETEROSCOPY/HOLMIUM LASER/STENT PLACEMENT Bilateral 07/11/2020   Procedure: CYSTOSCOPY/URETEROSCOPY/HOLMIUM LASER/STENT PLACEMENT;  Surgeon: Billey Co, MD;  Location: [facility];  Service: Urology;  Laterality: Bilateral;  . CYSTOSCOPY/URETEROSCOPY/HOLMIUM LASER/STENT PLACEMENT Left 07/26/2020   Procedure: CYSTOSCOPY/URETEROSCOPY/HOLMIUM  LASER/STENT EXCHANGE;  Surgeon: Billey Co, MD;  Location: [facility];  Service: Urology;  Laterality: Left;  . ESOPHAGOGASTRODUODENOSCOPY (EGD) WITH PROPOFOL N/A 01/03/2018   Procedure: ESOPHAGOGASTRODUODENOSCOPY (EGD) WITH PROPOFOL;  Surgeon: Manya Silvas, MD;  Location: [facility] ENDOSCOPY;  Service: Endoscopy;  Laterality: N/A;  . EXTRACORPOREAL SHOCK WAVE LITHOTRIPSY Left 07/11/2020   Procedure: EXTRACORPOREAL SHOCK WAVE LITHOTRIPSY (ESWL);  Surgeon: Billey Co, MD;  Location: [facility];  Service: Urology;  Laterality: Left;  . EYE SURGERY    . HERNIA REPAIR    . IR NEPHROSTOMY PLACEMENT RIGHT  03/15/2018  . kidney stones     lithotripsy  . LITHOTRIPSY    . NEPHROLITHOTOMY Right 03/15/2018   Procedure: NEPHROLITHOTOMY PERCUTANEOUS;  Surgeon: Abbie Sons, MD;  Location: [facility];  Service: Urology;  Laterality: Right;  . STONE EXTRACTION WITH BASKET  07/11/2020   Procedure: BLADDER STONE EXTRACTION;  Surgeon: Billey Co, MD;  Location: [facility];  Service: Urology;;  . UPPER GI ENDOSCOPY  2013    Social History   Tobacco Use  . Smoking status: Never Smoker  . Smokeless tobacco: Never Used  Vaping Use  . Vaping Use: Never used  Substance Use Topics  . Alcohol use: No    Alcohol/week: 0.0 standard drinks  . Drug use: No     Medication list has been reviewed and updated.  Current Meds  Medication Sig  . acetaminophen (TYLENOL) 500 MG tablet Take 1,000 mg by mouth daily as needed for moderate pain.  Marland Kitchen aspirin EC 81 MG tablet Take 81 mg by mouth daily.  Marland Kitchen atorvastatin (LIPITOR) 80  MG tablet TAKE ONE TABLET EVERY DAY (Patient taking differently: Take 80 mg by mouth daily. )  . benazepril-hydrochlorthiazide (LOTENSIN HCT) 10-12.5 MG tablet TAKE ONE TABLET EVERY DAY (Patient taking differently: Take 1 tablet by mouth daily. )  . Coenzyme Q10 (COQ-10) 100 MG CAPS Take 100 mg by mouth at bedtime.  . Dutasteride-Tamsulosin HCl 0.5-0.4 MG CAPS Take 1 capsule by mouth  daily. (Patient taking differently: Take 1 capsule by mouth daily. sninsky)  . EPINEPHrine 0.3 mg/0.3 mL IJ SOAJ injection Inject 0.3 mLs (0.3 mg total) into the muscle as needed for anaphylaxis.  . fluticasone (FLONASE) 50 MCG/ACT nasal spray Place 1 spray into both nostrils daily. otc  . metoprolol succinate (TOPROL-XL) 25 MG 24 hr tablet TAKE 1 TABLET BY MOUTH ONCE DAILY (Patient taking differently: Take 25 mg by mouth daily. )  . Multiple Vitamin (MULTIVITAMIN WITH MINERALS) TABS tablet Take 1 tablet by mouth at bedtime.  . mupirocin ointment (BACTROBAN) 2 % Apply 1 application topically 2 (two) times daily. (Patient taking differently: Apply 1 application topically 2 (two) times daily as needed (irritation). )  . naproxen sodium (ALEVE) 220 MG tablet Take 440 mg by mouth daily as needed (pain).  . nitroGLYCERIN (NITROSTAT) 0.4 MG SL tablet Place 1 tablet (0.4 mg total) under the tongue every 5 (five) minutes as needed for chest pain.  . pantoprazole (PROTONIX) 40 MG tablet TAKE 1 TABLET BY MOUTH DAILY (Patient taking differently: Take 40 mg by mouth daily. )  . [DISCONTINUED] etodolac (LODINE) 400 MG tablet TAKE ONE TABLET BY MOUTH EVERY DAY (Patient taking differently: Take 400 mg by mouth daily. )    PHQ 2/9 Scores 09/05/2020 05/28/2020 03/27/2020 01/23/2020  PHQ - 2 Score 0 0 1 0  PHQ- 9 Score 0 0 3 0    GAD 7 : Generalized Anxiety Score 09/05/2020 05/28/2020 03/27/2020 01/23/2020  Nervous, Anxious, on Edge 0 0 0 0  Control/stop worrying 0 0 0 0  Worry too much - different things 0 0 0 0  Trouble relaxing 0 0 0 0  Restless 0 0 0 0  Easily annoyed or irritable 0 0 0 0  Afraid - awful might happen 0 0 0 0  Total GAD 7 Score 0 0 0 0    BP Readings from Last 3 Encounters:  09/05/20 132/78  08/14/20 (!) 182/80  07/26/20 (!) 164/81    Physical Exam  Wt Readings from Last 3 Encounters:  09/05/20 186 lb (84.4 kg)  08/14/20 190 lb (86.2 kg)  07/26/20 185 lb 3 oz (84 kg)    BP 132/78    Pulse 64   Ht 5\' 7"  (1.702 m)   Wt 186 lb (84.4 kg)   BMI 29.13 kg/m   Assessment and Plan:

## 2020-09-06 LAB — LIPID PANEL WITH LDL/HDL RATIO
Cholesterol, Total: 120 mg/dL (ref 100–199)
HDL: 48 mg/dL (ref >39)
LDL Chol Calc (NIH): 54 mg/dL (ref 0–99)
LDL/HDL Ratio: 1.1 ratio (ref 0.0–3.6)
Triglycerides: 94 mg/dL (ref 0–149)
VLDL Cholesterol Cal: 18 mg/dL (ref 5–40)

## 2020-09-12 ENCOUNTER — Other Ambulatory Visit: Payer: Self-pay

## 2020-09-12 ENCOUNTER — Ambulatory Visit
Admission: RE | Admit: 2020-09-12 | Discharge: 2020-09-12 | Disposition: A | Discharge status: Home / Self Care | Payer: Medicare PPO | Source: Ambulatory Visit | Attending: Urology | Admitting: Urology

## 2020-09-12 DIAGNOSIS — N281 Cyst of kidney, acquired: Secondary | ICD-10-CM | POA: Diagnosis not present

## 2020-09-12 DIAGNOSIS — N200 Calculus of kidney: Secondary | ICD-10-CM

## 2020-09-13 ENCOUNTER — Ambulatory Visit: Payer: Medicare PPO

## 2020-09-18 DIAGNOSIS — Z951 Presence of aortocoronary bypass graft: Secondary | ICD-10-CM | POA: Diagnosis not present

## 2020-09-18 DIAGNOSIS — I10 Essential (primary) hypertension: Secondary | ICD-10-CM | POA: Diagnosis not present

## 2020-09-18 DIAGNOSIS — K219 Gastro-esophageal reflux disease without esophagitis: Secondary | ICD-10-CM | POA: Diagnosis not present

## 2020-09-18 DIAGNOSIS — I2510 Atherosclerotic heart disease of native coronary artery without angina pectoris: Secondary | ICD-10-CM | POA: Diagnosis not present

## 2020-09-18 DIAGNOSIS — E782 Mixed hyperlipidemia: Secondary | ICD-10-CM | POA: Diagnosis not present

## 2020-09-18 DIAGNOSIS — N200 Calculus of kidney: Secondary | ICD-10-CM | POA: Diagnosis not present

## 2020-09-18 DIAGNOSIS — G4733 Obstructive sleep apnea (adult) (pediatric): Secondary | ICD-10-CM | POA: Diagnosis not present

## 2020-09-18 DIAGNOSIS — I208 Other forms of angina pectoris: Secondary | ICD-10-CM | POA: Diagnosis not present

## 2020-09-19 ENCOUNTER — Ambulatory Visit: Payer: Medicare PPO | Admitting: Urology

## 2020-09-19 ENCOUNTER — Other Ambulatory Visit: Payer: Self-pay

## 2020-09-19 ENCOUNTER — Encounter: Payer: Self-pay | Admitting: Urology

## 2020-09-19 VITALS — BP 175/69 | HR 71 | Ht 67.0 in | Wt 184.0 lb

## 2020-09-19 DIAGNOSIS — N200 Calculus of kidney: Secondary | ICD-10-CM | POA: Diagnosis not present

## 2020-09-19 DIAGNOSIS — N401 Enlarged prostate with lower urinary tract symptoms: Secondary | ICD-10-CM | POA: Diagnosis not present

## 2020-09-19 DIAGNOSIS — N138 Other obstructive and reflux uropathy: Secondary | ICD-10-CM | POA: Diagnosis not present

## 2020-09-19 NOTE — Patient Instructions (Signed)
Benign Prostatic Hyperplasia  Benign prostatic hyperplasia (BPH) is an enlarged prostate gland that is caused by the normal aging process and not by cancer. The prostate is a walnut-sized gland that is involved in the production of semen. It is located in front of the rectum and below the bladder. The bladder stores urine and the urethra is the tube that carries the urine out of the body. The prostate may get bigger as a man gets older. An enlarged prostate can press on the urethra. This can make it harder to pass urine. The build-up of urine in the bladder can cause infection. Back pressure and infection may progress to bladder damage and kidney (renal) failure. What are the causes? This condition is part of a normal aging process. However, not all men develop problems from this condition. If the prostate enlarges away from the urethra, urine flow will not be blocked. If it enlarges toward the urethra and compresses it, there will be problems passing urine. What increases the risk? This condition is more likely to develop in men over the age of 50 years. What are the signs or symptoms? Symptoms of this condition include:  Getting up often during the night to urinate.  Needing to urinate frequently during the day.  Difficulty starting urine flow.  Decrease in size and strength of your urine stream.  Leaking (dribbling) after urinating.  Inability to pass urine. This needs immediate treatment.  Inability to completely empty your bladder.  Pain when you pass urine. This is more common if there is also an infection.  Urinary tract infection (UTI). How is this diagnosed? This condition is diagnosed based on your medical history, a physical exam, and your symptoms. Tests will also be done, such as:  A post-void bladder scan. This measures any amount of urine that may remain in your bladder after you finish urinating.  A digital rectal exam. In a rectal exam, your health care provider  checks your prostate by putting a lubricated, gloved finger into your rectum to feel the back of your prostate gland. This exam detects the size of your gland and any abnormal lumps or growths.  An exam of your urine (urinalysis).  A prostate specific antigen (PSA) screening. This is a blood test used to screen for prostate cancer.  An ultrasound. This test uses sound waves to electronically produce a picture of your prostate gland. Your health care provider may refer you to a specialist in kidney and prostate diseases (urologist). How is this treated? Once symptoms begin, your health care provider will monitor your condition (active surveillance or watchful waiting). Treatment for this condition will depend on the severity of your condition. Treatment may include:  Observation and yearly exams. This may be the only treatment needed if your condition and symptoms are mild.  Medicines to relieve your symptoms, including: ? Medicines to shrink the prostate. ? Medicines to relax the muscle of the prostate.  Surgery in severe cases. Surgery may include: ? Prostatectomy. In this procedure, the prostate tissue is removed completely through an open incision or with a laparoscope or robotics. ? Transurethral resection of the prostate (TURP). In this procedure, a tool is inserted through the opening at the tip of the penis (urethra). It is used to cut away tissue of the inner core of the prostate. The pieces are removed through the same opening of the penis. This removes the blockage. ? Transurethral incision (TUIP). In this procedure, small cuts are made in the prostate. This lessens   the prostate's pressure on the urethra. ? Transurethral microwave thermotherapy (TUMT). This procedure uses microwaves to create heat. The heat destroys and removes a small amount of prostate tissue. ? Transurethral needle ablation (TUNA). This procedure uses radio frequencies to destroy and remove a small amount of  prostate tissue. ? Interstitial laser coagulation ([place]). This procedure uses a laser to destroy and remove a small amount of prostate tissue. ? Transurethral electrovaporization (TUVP). This procedure uses electrodes to destroy and remove a small amount of prostate tissue. ? Prostatic urethral lift. This procedure inserts an implant to push the lobes of the prostate away from the urethra. Follow these instructions at home:  Take over-the-counter and prescription medicines only as told by your health care provider.  Monitor your symptoms for any changes. Contact your health care provider with any changes.  Avoid drinking large amounts of liquid before going to bed or out in public.  Avoid or reduce how much caffeine or alcohol you drink.  Give yourself time when you urinate.  Keep all follow-up visits as told by your health care provider. This is important. Contact a health care provider if:  You have unexplained back pain.  Your symptoms do not get better with treatment.  You develop side effects from the medicine you are taking.  Your urine becomes very dark or has a bad smell.  Your lower abdomen becomes distended and you have trouble passing your urine. Get help right away if:  You have a fever or chills.  You suddenly cannot urinate.  You feel lightheaded, or very dizzy, or you faint.  There are large amounts of blood or clots in the urine.  Your urinary problems become hard to manage.  You develop moderate to severe low back or flank pain. The flank is the side of your body between the ribs and the hip. These symptoms may represent a serious problem that is an emergency. Do not wait to see if the symptoms will go away. Get medical help right away. Call your local emergency services (911 in the [place] Do not drive yourself to the hospital. Summary  Benign prostatic hyperplasia (BPH) is an enlarged prostate that is caused by the normal aging process and not by  cancer.  An enlarged prostate can press on the urethra. This can make it hard to pass urine.  This condition is part of a normal aging process and is more likely to develop in men over the age of 34 years.  Get help right away if you suddenly cannot urinate. This information is not intended to replace advice given to you by your health care provider. Make sure you discuss any questions you have with your health care provider. Document Revised: 10/04/2018 Document Reviewed: 12/14/2016 Elsevier Patient Education  2020 [place]. Dietary Guidelines to Help Prevent Kidney Stones Kidney stones are deposits of minerals and salts that form inside your kidneys. Your risk of developing kidney stones may be greater depending on your diet, your lifestyle, the medicines you take, and whether you have certain medical conditions. Most people can reduce their chances of developing kidney stones by following the instructions below. Depending on your overall health and the type of kidney stones you tend to develop, your dietitian may give you more specific instructions. What are tips for following this plan? Reading food labels  Choose foods with "no salt added" or "low-salt" labels. Limit your sodium intake to less than 1500 mg per day.  Choose foods with calcium for each meal  and snack. Try to eat about 300 mg of calcium at each meal. Foods that contain 200-500 mg of calcium per serving include: ? 8 oz (237 ml) of milk, fortified nondairy milk, and fortified fruit juice. ? 8 oz (237 ml) of kefir, yogurt, and soy yogurt. ? 4 oz (118 ml) of tofu. ? 1 oz of cheese. ? 1 cup (300 g) of dried figs. ? 1 cup (91 g) of cooked broccoli. ? 1-3 oz can of sardines or mackerel.  Most people need 1000 to 1500 mg of calcium each day. Talk to your dietitian about how much calcium is recommended for you. Shopping  Buy plenty of fresh fruits and vegetables. Most people do not need to avoid fruits and vegetables, even  if they contain nutrients that may contribute to kidney stones.  When shopping for convenience foods, choose: ? Whole pieces of fruit. ? Premade salads with dressing on the side. ? Low-fat fruit and yogurt smoothies.  Avoid buying frozen meals or prepared deli foods.  Look for foods with live cultures, such as yogurt and kefir. Cooking  Do not add salt to food when cooking. Place a salt shaker on the table and allow each person to add his or her own salt to taste.  Use vegetable protein, such as beans, textured vegetable protein (TVP), or tofu instead of meat in pasta, casseroles, and soups. Meal planning   Eat less salt, if told by your dietitian. To do this: ? Avoid eating processed or premade food. ? Avoid eating fast food.  Eat less animal protein, including cheese, meat, poultry, or fish, if told by your dietitian. To do this: ? Limit the number of times you have meat, poultry, fish, or cheese each week. Eat a diet free of meat at least 2 days a week. ? Eat only one serving each day of meat, poultry, fish, or seafood. ? When you prepare animal protein, cut pieces into small portion sizes. For most meat and fish, one serving is about the size of one deck of cards.  Eat at least 5 servings of fresh fruits and vegetables each day. To do this: ? Keep fruits and vegetables on hand for snacks. ? Eat 1 piece of fruit or a handful of berries with breakfast. ? Have a salad and fruit at lunch. ? Have two kinds of vegetables at dinner.  Limit foods that are high in a substance called oxalate. These include: ? Spinach. ? Rhubarb. ? Beets. ? Potato chips and french fries. ? Nuts.  If you regularly take a diuretic medicine, make sure to eat at least 1-2 fruits or vegetables high in potassium each day. These include: ? Avocado. ? Banana. ? Orange, prune, carrot, or tomato juice. ? Baked potato. ? Cabbage. ? Beans and split peas. General instructions   Drink enough fluid to  keep your urine clear or pale yellow. This is the most important thing you can do.  Talk to your health care provider and dietitian about taking daily supplements. Depending on your health and the cause of your kidney stones, you may be advised: ? Not to take supplements with vitamin C. ? To take a calcium supplement. ? To take a daily probiotic supplement. ? To take other supplements such as magnesium, fish oil, or vitamin B6.  Take all medicines and supplements as told by your health care provider.  Limit alcohol intake to no more than 1 drink a day for nonpregnant women and 2 drinks a day for men.  One drink equals 12 oz of beer, 5 oz of wine, or 1 oz of hard liquor.  Lose weight if told by your health care provider. Work with your dietitian to find strategies and an eating plan that works best for you. What foods are not recommended? Limit your intake of the following foods, or as told by your dietitian. Talk to your dietitian about specific foods you should avoid based on the type of kidney stones and your overall health. Grains Breads. Bagels. Rolls. Baked goods. Salted crackers. Cereal. Pasta. Vegetables Spinach. Rhubarb. Beets. Canned vegetables. Angie Fava. Olives. Meats and other protein foods Nuts. Nut butters. Large portions of meat, poultry, or fish. Salted or cured meats. Deli meats. Hot dogs. Sausages. Dairy Cheese. Beverages Regular soft drinks. Regular vegetable juice. Seasonings and other foods Seasoning blends with salt. Salad dressings. Canned soups. Soy sauce. Ketchup. Barbecue sauce. Canned pasta sauce. Casseroles. Pizza. Lasagna. Frozen meals. Potato chips. [place] fries. Summary  You can reduce your risk of kidney stones by making changes to your diet.  The most important thing you can do is drink enough fluid. You should drink enough fluid to keep your urine clear or pale yellow.  Ask your health care provider or dietitian how much protein from animal sources you  should eat each day, and also how much salt and calcium you should have each day. This information is not intended to replace advice given to you by your health care provider. Make sure you discuss any questions you have with your health care provider. Document Revised: 03/01/2019 Document Reviewed: 10/20/2016 Elsevier Patient Education  2020 [employer].

## 2020-09-19 NOTE — Progress Notes (Signed)
   09/19/2020 1:19 PM   Timothy Hatfield 12-Jan-1946 [id number]  Reason for visit: Follow up recurrent nephrolithiasis, BPH  HPI: I saw Timothy Hatfield in urology clinic for the above issues.  He is a 74 year old male who recently underwent staged bilateral ureteroscopy with me for significant bilateral stone burden.  Stents have since been removed and he denies any complaints over the last month.  He specifically denies any gross hematuria, flank pain, or dysuria.  He reports he has been consuming a large volume of fluids, including increased citrate.  I personally reviewed his renal ultrasound on 10/21 that shows no significant residual nephrolithiasis and mild left pelviectasis, which is not surprising after treatment of his 2 cm left-sided stone.  He has had multiple 24-hour urine test previously which all showed low urine volume, and he would like to hold off on any medications like potassium citrate at this time or repeat 24-hour urine.  Stone analysis that showed 50% calcium oxalate and 50% uric acid previously.  Regarding his BPH, he denies any significant urinary complaints at this time and says he is voiding well.  He remains on dutasteride-tamsulosin.  Prostate measures 58 g on recent CT, and on recent cystoscopy showed a large prostate that was quite friable.  He has a history of bladder stones.  We discussed general stone prevention strategies including adequate hydration with goal of producing 2.5 L of urine daily, increasing citric acid intake, increasing calcium intake during high oxalate meals, minimizing animal protein, and decreasing salt intake. Information about dietary recommendations given today.   Continue dutasteride-tamsulosin for BPH Stone prevention strategies discussed at length RTC 6 months with renal ultrasound/KUB for stone surveillance, and IPSS/PVR for BPH  Billey Co, MD  [facility] [address], [place],  [place] [phone]

## 2020-09-28 DIAGNOSIS — G4733 Obstructive sleep apnea (adult) (pediatric): Secondary | ICD-10-CM | POA: Diagnosis not present

## 2020-10-28 DIAGNOSIS — G4733 Obstructive sleep apnea (adult) (pediatric): Secondary | ICD-10-CM | POA: Diagnosis not present

## 2020-11-28 DIAGNOSIS — G4733 Obstructive sleep apnea (adult) (pediatric): Secondary | ICD-10-CM | POA: Diagnosis not present

## 2020-12-30 DIAGNOSIS — G4733 Obstructive sleep apnea (adult) (pediatric): Secondary | ICD-10-CM | POA: Diagnosis not present

## 2021-01-08 ENCOUNTER — Other Ambulatory Visit: Payer: Self-pay | Admitting: Family Medicine

## 2021-01-13 ENCOUNTER — Ambulatory Visit: Payer: Self-pay | Admitting: Urology

## 2021-01-29 ENCOUNTER — Ambulatory Visit (INDEPENDENT_AMBULATORY_CARE_PROVIDER_SITE_OTHER): Payer: Medicare PPO

## 2021-01-29 DIAGNOSIS — Z0000 Encounter for general adult medical examination without abnormal findings: Secondary | ICD-10-CM

## 2021-01-29 NOTE — Progress Notes (Signed)
Subjective:   Timothy Hatfield is a 75 y.o. male who presents for an Initial Medicare Annual Wellness Visit.  Virtual Visit via Telephone Note  I connected with  Timothy Hatfield on 01/29/21 at  2:00 PM EST by telephone and verified that I am speaking with the correct person using two identifiers.  Location: Patient: home Provider: [facility] Persons participating in the virtual visit: Timothy Hatfield   I discussed the limitations, risks, security and privacy concerns of performing an evaluation and management service by telephone and the availability of in person appointments. The patient expressed understanding and agreed to proceed.  Interactive audio and video telecommunications were attempted between this nurse and patient, however failed, due to patient having technical difficulties OR patient did not have access to video capability.  We continued and completed visit with audio only.  Some vital signs may be absent or patient reported.   Timothy Marker, LPN    Review of Systems: Cardiac Risk Factors include: advanced age (>37men, >50 women);male gender;dyslipidemia;hypertension     Objective:    There were no vitals filed for this visit. There is no height or weight on file to calculate BMI.  Advanced Directives 01/29/2021 07/19/2020 07/11/2020 03/15/2018 03/08/2018 01/03/2018 09/09/2016  Does Patient Have a Medical Advance Directive? No No No No No No No  Would patient like information on creating a medical advance directive? Yes (MAU/Ambulatory/Procedural Areas - Information given) No - Patient declined No - Patient declined Yes (MAU/Ambulatory/Procedural Areas - Information given) Yes (MAU/Ambulatory/Procedural Areas - Information given) No - Patient declined No - patient declined information    Current Medications (verified) Outpatient Encounter Medications as of 01/29/2021  Medication Sig  . acetaminophen (TYLENOL) 500 MG tablet Take 1,000 mg by mouth daily as  needed for moderate pain.  Marland Kitchen aspirin EC 81 MG tablet Take 81 mg by mouth daily.  Marland Kitchen atorvastatin (LIPITOR) 80 MG tablet Take 1 tablet (80 mg total) by mouth daily.  . benazepril-hydrochlorthiazide (LOTENSIN HCT) 10-12.5 MG tablet Take 1 tablet by mouth daily.  . Coenzyme Q10 (COQ-10) 100 MG CAPS Take 100 mg by mouth at bedtime.  . dicyclomine (BENTYL) 10 MG capsule TAKE 1 CAPSULE BY MOUTH EVERY MORNING AND TAKE 1 CAPSULE AT BEDTIME  . Dutasteride-Tamsulosin HCl 0.5-0.4 MG CAPS Take 1 capsule by mouth daily. (Patient taking differently: Take 1 capsule by mouth daily. sninsky)  . EPINEPHrine 0.3 mg/0.3 mL IJ SOAJ injection Inject 0.3 mLs (0.3 mg total) into the muscle as needed for anaphylaxis.  Marland Kitchen etodolac (LODINE) 400 MG tablet TAKE ONE TABLET BY MOUTH EVERY DAY  . fluticasone (FLONASE) 50 MCG/ACT nasal spray Place 1 spray into both nostrils daily. otc  . metoprolol succinate (TOPROL-XL) 25 MG 24 hr tablet Take 1 tablet (25 mg total) by mouth daily.  . Multiple Vitamin (MULTIVITAMIN WITH MINERALS) TABS tablet Take 1 tablet by mouth at bedtime.  . mupirocin ointment (BACTROBAN) 2 % Apply 1 application topically 2 (two) times daily. (Patient taking differently: Apply 1 application topically 2 (two) times daily as needed (irritation).)  . naproxen sodium (ALEVE) 220 MG tablet Take 440 mg by mouth daily as needed (pain).  . nitroGLYCERIN (NITROSTAT) 0.4 MG SL tablet Place 1 tablet (0.4 mg total) under the tongue every 5 (five) minutes as needed for chest pain.  . pantoprazole (PROTONIX) 40 MG tablet Take 1 tablet (40 mg total) by mouth daily.  . [DISCONTINUED] Misc Natural Products (GLUCOSAMINE CHOND DOUBLE STR PO) Take 2 tablets by  mouth at bedtime.    No facility-administered encounter medications on file as of 01/29/2021.    Allergies (verified) Bee venom and Ciprofloxacin   History: Past Medical History:  Diagnosis Date  . Allergy   . BPH (benign prostatic hyperplasia)   . Coronary artery  disease   . GERD (gastroesophageal reflux disease)   . History of kidney stones   . Hyperlipidemia   . Hypertension   . Pneumonia   . Renal disorder    kidney stones  . Sleep apnea    CPAP   Past Surgical History:  Procedure Laterality Date  . CARDIAC CATHETERIZATION Left 09/09/2016   Procedure: Left Heart Cath and Coronary Angiography;  Surgeon: Yolonda Kida, MD;  Location: [place] [facility];  Service: Cardiovascular;  Laterality: Left;  . COLONOSCOPY  2013   normal/ Dr Timothy Hatfield- cleared for 10 yrs  . COLONOSCOPY WITH PROPOFOL N/A 01/03/2018   Procedure: COLONOSCOPY WITH PROPOFOL;  Surgeon: Timothy Silvas, MD;  Location: [facility];  Service: Endoscopy;  Laterality: N/A;  . CORONARY ARTERY BYPASS GRAFT    . CYSTOSCOPY W/ RETROGRADES Bilateral 07/11/2020   Procedure: CYSTOSCOPY WITH RETROGRADE PYELOGRAM;  Surgeon: Timothy Co, MD;  Location: [facility];  Service: Urology;  Laterality: Bilateral;  . CYSTOSCOPY W/ RETROGRADES  07/26/2020   Procedure: CYSTOSCOPY WITH RETROGRADE PYELOGRAM;  Surgeon: Timothy Co, MD;  Location: [facility];  Service: Urology;;  . Timothy Hatfield W/ URETERAL STENT REMOVAL Right 07/26/2020   Procedure: CYSTOSCOPY WITH STENT REMOVAL;  Surgeon: Timothy Co, MD;  Location: [facility];  Service: Urology;  Laterality: Right;  . CYSTOSCOPY/URETEROSCOPY/HOLMIUM LASER/STENT PLACEMENT Bilateral 07/11/2020   Procedure: CYSTOSCOPY/URETEROSCOPY/HOLMIUM LASER/STENT PLACEMENT;  Surgeon: Timothy Co, MD;  Location: [facility];  Service: Urology;  Laterality: Bilateral;  . CYSTOSCOPY/URETEROSCOPY/HOLMIUM LASER/STENT PLACEMENT Left 07/26/2020   Procedure: CYSTOSCOPY/URETEROSCOPY/HOLMIUM LASER/STENT EXCHANGE;  Surgeon: Timothy Co, MD;  Location: [facility];  Service: Urology;  Laterality: Left;  . ESOPHAGOGASTRODUODENOSCOPY (EGD) WITH PROPOFOL N/A 01/03/2018   Procedure: ESOPHAGOGASTRODUODENOSCOPY (EGD) WITH PROPOFOL;  Surgeon: Timothy Silvas, MD;  Location:  [facility] ENDOSCOPY;  Service: Endoscopy;  Laterality: N/A;  . EXTRACORPOREAL SHOCK WAVE LITHOTRIPSY Left 07/11/2020   Procedure: EXTRACORPOREAL SHOCK WAVE LITHOTRIPSY (ESWL);  Surgeon: Timothy Co, MD;  Location: [facility];  Service: Urology;  Laterality: Left;  . EYE SURGERY    . HERNIA REPAIR    . IR NEPHROSTOMY PLACEMENT RIGHT  03/15/2018  . kidney stones     lithotripsy  . LITHOTRIPSY    . NEPHROLITHOTOMY Right 03/15/2018   Procedure: NEPHROLITHOTOMY PERCUTANEOUS;  Surgeon: Abbie Sons, MD;  Location: [facility];  Service: Urology;  Laterality: Right;  . STONE EXTRACTION WITH BASKET  07/11/2020   Procedure: BLADDER STONE EXTRACTION;  Surgeon: Timothy Co, MD;  Location: [facility];  Service: Urology;;  . UPPER GI ENDOSCOPY  2013   Family History  Problem Relation Age of Onset  . Arrhythmia Mother   . Coronary artery disease Father   . Diabetes Father   . Hyperlipidemia Brother    Social History   Socioeconomic History  . Marital status: Married    Spouse name: Not on file  . Number of children: Not on file  . Years of education: Not on file  . Highest education level: Not on file  Occupational History  . Not on file  Tobacco Use  . Smoking status: Never Smoker  . Smokeless tobacco: Never Used  Vaping Use  . Vaping Use: Never  used  Substance and Sexual Activity  . Alcohol use: No    Alcohol/week: 0.0 standard drinks  . Drug use: No  . Sexual activity: Yes  Other Topics Concern  . Not on file  Social History Narrative  . Not on file   Social Determinants of Health   Financial Resource Strain: Low Risk   . Difficulty of Paying Living Expenses: Not hard at all  Food Insecurity: No Food Insecurity  . Worried About Charity fundraiser in the Last Year: Never true  . Ran Out of Food in the Last Year: Never true  Transportation Needs: No Transportation Needs  . Lack of Transportation (Medical): No  . Lack of Transportation (Non-Medical): No  Physical Activity:  Inactive  . Days of Exercise per Week: 0 days  . Minutes of Exercise per Session: 0 min  Stress: No Stress Concern Present  . Feeling of Stress : Not at all  Social Connections: Moderately Isolated  . Frequency of Communication with Friends and Family: More than three times a week  . Frequency of Social Gatherings with Friends and Family: More than three times a week  . Attends Religious Services: Never  . Active Member of Clubs or Organizations: No  . Attends Archivist Meetings: Never  . Marital Status: Married    Tobacco Counseling Counseling given: Not Answered   Clinical Intake:  Pre-visit preparation completed: Yes  Pain : No/denies pain     Nutritional Risks: None Diabetes: No  How often do you need to have someone help you when you read instructions, pamphlets, or other written materials from your doctor or pharmacy?: 1 - Never    Interpreter Needed?: No  Information entered by :: Timothy Marker LPN   Activities of Daily Living In your present state of health, do you have any difficulty performing the following activities: 01/29/2021 07/19/2020  Hearing? N N  Comment declines hearing aids -  Vision? N N  Difficulty concentrating or making decisions? N N  Walking or climbing stairs? N N  Dressing or bathing? N N  Doing errands, shopping? N N  Preparing Food and eating ? N -  Using the Toilet? N -  In the past six months, have you accidently leaked urine? N -  Do you have problems with loss of bowel control? N -  Managing your Medications? N -  Managing your Finances? N -  Housekeeping or managing your Housekeeping? N -  Some recent data might be hidden    Patient Care Team: Juline Patch, MD as PCP - General (Family Medicine)  Indicate any recent Medical Services you may have received from other than [facility] providers in the past year (date may be approximate).     Assessment:   This is a routine wellness examination for  Wilfrid.  Hearing/Vision screen  Hearing Screening   125Hz  250Hz  500Hz  1000Hz  2000Hz  3000Hz  4000Hz  6000Hz  8000Hz   Right ear:           Left ear:           Comments: Pt denies hearing difficulty  Vision Screening Comments: Annual vision screenings done [facility] Thomasene [facility]  Dietary issues and exercise activities discussed: Current Exercise Habits: The patient does not participate in regular exercise at present, Exercise limited by: None identified  Goals   None    Depression Screen PHQ 2/9 Scores 01/29/2021 09/05/2020 05/28/2020 03/27/2020 01/23/2020 08/07/2019 12/29/2018  PHQ - 2 Score 0 0 0 1  0 0 0  PHQ- 9 Score - 0 0 3 0 0 0    Fall Risk Fall Risk  01/29/2021 09/05/2020 05/28/2020 03/27/2020 12/29/2018  Falls in the past year? 0 0 0 0 0  Comment - - - - -  Number falls in past yr: 0 - - - 0  Injury with Fall? 0 - - - 0  Risk for fall due to : No Fall Risks - - - -  Follow up Falls prevention discussed Falls evaluation completed Falls evaluation completed Falls evaluation completed -    FALL RISK PREVENTION PERTAINING TO THE HOME:  Any stairs in or around the home? Yes  If so, are there any without handrails? No  Home free of loose throw rugs in walkways, pet beds, electrical cords, etc? Yes  Adequate lighting in your home to reduce risk of falls? Yes   ASSISTIVE DEVICES UTILIZED TO PREVENT FALLS:  Life alert? No  Use of a cane, walker or w/c? No  Grab bars in the bathroom? Yes  Shower chair or bench in shower? Yes  Elevated toilet seat or a handicapped toilet? Yes   TIMED UP AND GO:  Was the test performed? No . Telephonic visit  Cognitive Function: Normal cognitive status assessed by direct observation by this Nurse Health Advisor. No abnormalities found.          Immunizations Immunization History  Administered Date(s) Administered  . Fluad Quad(high Dose 65+) 08/07/2019, 09/05/2020  . Influenza, High Dose Seasonal PF 09/28/2017, 10/03/2018  .  Influenza,inj,Quad PF,6+ Mos 10/25/2014, 08/02/2015, 08/17/2016  . Influenza-Unspecified 08/02/2015, 08/23/2016  . Pneumococcal Conjugate-13 01/23/2020  . Pneumococcal Polysaccharide-23 09/28/2017    TDAP status: Due, Education has been provided regarding the importance of this vaccine. Advised may receive this vaccine at local pharmacy or Health Dept. Aware to provide a copy of the vaccination record if obtained from local pharmacy or Health Dept. Verbalized acceptance and understanding.  Flu Vaccine status: Up to date  Pneumococcal vaccine status: Up to date  Covid-19 vaccine status: Declined, Education has been provided regarding the importance of this vaccine but patient still declined. Advised may receive this vaccine at local pharmacy or Health Dept.or vaccine clinic. Aware to provide a copy of the vaccination record if obtained from local pharmacy or Health Dept. Verbalized acceptance and understanding.  Qualifies for Shingles Vaccine? Yes   Zostavax completed No   Shingrix Completed?: No.    Education has been provided regarding the importance of this vaccine. Patient has been advised to call insurance company to determine out of pocket expense if they have not yet received this vaccine. Advised may also receive vaccine at local pharmacy or Health Dept. Verbalized acceptance and understanding.  Screening Tests Health Maintenance  Topic Date Due  . Hepatitis C Screening  Never done  . COVID-19 Vaccine (1) 02/14/2021 (Originally 09/24/1951)  . TETANUS/TDAP  11/23/2021 (Originally 09/24/1965)  . COLONOSCOPY (Pts 45-34yrs Insurance coverage will need to be confirmed)  01/04/2028  . INFLUENZA VACCINE  Completed  . PNA vac Low Risk Adult  Completed  . HPV VACCINES  Aged Out    Health Maintenance  Health Maintenance Due  Topic Date Due  . Hepatitis C Screening  Never done    Colorectal cancer screening: Type of screening: Colonoscopy. Completed 01/03/18. Repeat every 10  years  Lung Cancer Screening: (Low Dose CT Chest recommended if Age 25-80 years, 30 pack-year currently smoking OR have quit w/in 15years.) does not qualify.  Additional Screening:  Hepatitis C Screening: does qualify; postponed  Vision Screening: Recommended annual ophthalmology exams for early detection of glaucoma and other disorders of the eye. Is the patient up to date with their annual eye exam?  Yes  Who is the provider or what is the name of the office in which the patient attends annual eye exams? [facility] Screening: Recommended annual dental exams for proper oral hygiene  Community Resource Referral / Chronic Care Management: CRR required this visit?  No   CCM required this visit?  No      Plan:     I have personally reviewed and noted the following in the patient's chart:   . Medical and social history . Use of alcohol, tobacco or illicit drugs  . Current medications and supplements . Functional ability and status . Nutritional status . Physical activity . Advanced directives . List of other physicians . Hospitalizations, surgeries, and ER visits in previous 12 months . Vitals . Screenings to include cognitive, depression, and falls . Referrals and appointments  In addition, I have reviewed and discussed with patient certain preventive protocols, quality metrics, and best practice recommendations. A written personalized care plan for preventive services as well as general preventive health recommendations were provided to patient.     Timothy Marker, LPN   [id number]   Nurse Notes: none

## 2021-01-29 NOTE — Patient Instructions (Signed)
Timothy Hatfield , Thank you for taking time to come for your Medicare Wellness Visit. I appreciate your ongoing commitment to your health goals. Please review the following plan we discussed and let me know if I can assist you in the future.   Screening recommendations/referrals: Colonoscopy: done 01/03/18 Recommended yearly ophthalmology/optometry visit for glaucoma screening and checkup Recommended yearly dental visit for hygiene and checkup  Vaccinations: Influenza vaccine: done 09/05/20 Pneumococcal vaccine: done 01/23/20 Tdap vaccine: due Shingles vaccine: Shingrix discussed. Please contact your pharmacy for coverage information.  Covid-19: declined  Advanced directives: Advance directive discussed with you today. I have provided a copy for you to complete at home and have notarized. Once this is complete please bring a copy in to our office so we can scan it into your chart.  Conditions/risks identified: Recommend increasing physical activity   Next appointment: Follow up in one year for your annual wellness visit.   Preventive Care 85 Years and Older, Male Preventive care refers to lifestyle choices and visits with your health care provider that can promote health and wellness. What does preventive care include?  A yearly physical exam. This is also called an annual well check.  Dental exams once or twice a year.  Routine eye exams. Ask your health care provider how often you should have your eyes checked.  Personal lifestyle choices, including:  Daily care of your teeth and gums.  Regular physical activity.  Eating a healthy diet.  Avoiding tobacco and drug use.  Limiting alcohol use.  Practicing safe sex.  Taking low doses of aspirin every day.  Taking vitamin and mineral supplements as recommended by your health care provider. What happens during an annual well check? The services and screenings done by your health care provider during your annual well check will  depend on your age, overall health, lifestyle risk factors, and family history of disease. Counseling  Your health care provider may ask you questions about your:  Alcohol use.  Tobacco use.  Drug use.  Emotional well-being.  Home and relationship well-being.  Sexual activity.  Eating habits.  History of falls.  Memory and ability to understand (cognition).  Work and work Statistician. Screening  You may have the following tests or measurements:  Height, weight, and BMI.  Blood pressure.  Lipid and cholesterol levels. These may be checked every 5 years, or more frequently if you are over 23 years old.  Skin check.  Lung cancer screening. You may have this screening every year starting at age 26 if you have a 30-pack-year history of smoking and currently smoke or have quit within the past 15 years.  Fecal occult blood test (FOBT) of the stool. You may have this test every year starting at age 11.  Flexible sigmoidoscopy or colonoscopy. You may have a sigmoidoscopy every 5 years or a colonoscopy every 10 years starting at age 101.  Prostate cancer screening. Recommendations will vary depending on your family history and other risks.  Hepatitis C blood test.  Hepatitis B blood test.  Sexually transmitted disease (STD) testing.  Diabetes screening. This is done by checking your blood sugar (glucose) after you have not eaten for a while (fasting). You may have this done every 1-3 years.  Abdominal aortic aneurysm (AAA) screening. You may need this if you are a current or former smoker.  Osteoporosis. You may be screened starting at age 17 if you are at high risk. Talk with your health care provider about your test results, treatment  options, and if necessary, the need for more tests. Vaccines  Your health care provider may recommend certain vaccines, such as:  Influenza vaccine. This is recommended every year.  Tetanus, diphtheria, and acellular pertussis (Tdap,  Td) vaccine. You may need a Td booster every 10 years.  Zoster vaccine. You may need this after age 55.  Pneumococcal 13-valent conjugate (PCV13) vaccine. One dose is recommended after age 20.  Pneumococcal polysaccharide (PPSV23) vaccine. One dose is recommended after age 5. Talk to your health care provider about which screenings and vaccines you need and how often you need them. This information is not intended to replace advice given to you by your health care provider. Make sure you discuss any questions you have with your health care provider. Document Released: 12/06/2015 Document Revised: 07/29/2016 Document Reviewed: 09/10/2015 Elsevier Interactive Patient Education  2017 [place] Prevention in the Home Falls can cause injuries. They can happen to people of all ages. There are many things you can do to make your home safe and to help prevent falls. What can I do on the outside of my home?  Regularly fix the edges of walkways and driveways and fix any cracks.  Remove anything that might make you trip as you walk through a door, such as a raised step or threshold.  Trim any bushes or trees on the path to your home.  Use bright outdoor lighting.  Clear any walking paths of anything that might make someone trip, such as rocks or tools.  Regularly check to see if handrails are loose or broken. Make sure that both sides of any steps have handrails.  Any raised decks and porches should have guardrails on the edges.  Have any leaves, snow, or ice cleared regularly.  Use sand or salt on walking paths during winter.  Clean up any spills in your garage right away. This includes oil or grease spills. What can I do in the bathroom?  Use night lights.  Install grab bars by the toilet and in the tub and shower. Do not use towel bars as grab bars.  Use non-skid mats or decals in the tub or shower.  If you need to sit down in the shower, use a plastic, non-slip  stool.  Keep the floor dry. Clean up any water that spills on the floor as soon as it happens.  Remove soap buildup in the tub or shower regularly.  Attach bath mats securely with double-sided non-slip rug tape.  Do not have throw rugs and other things on the floor that can make you trip. What can I do in the bedroom?  Use night lights.  Make sure that you have a light by your bed that is easy to reach.  Do not use any sheets or blankets that are too big for your bed. They should not hang down onto the floor.  Have a firm chair that has side arms. You can use this for support while you get dressed.  Do not have throw rugs and other things on the floor that can make you trip. What can I do in the kitchen?  Clean up any spills right away.  Avoid walking on wet floors.  Keep items that you use a lot in easy-to-reach places.  If you need to reach something above you, use a strong step stool that has a grab bar.  Keep electrical cords out of the way.  Do not use floor polish or wax that makes floors slippery.  If you must use wax, use non-skid floor wax.  Do not have throw rugs and other things on the floor that can make you trip. What can I do with my stairs?  Do not leave any items on the stairs.  Make sure that there are handrails on both sides of the stairs and use them. Fix handrails that are broken or loose. Make sure that handrails are as long as the stairways.  Check any carpeting to make sure that it is firmly attached to the stairs. Fix any carpet that is loose or worn.  Avoid having throw rugs at the top or bottom of the stairs. If you do have throw rugs, attach them to the floor with carpet tape.  Make sure that you have a light switch at the top of the stairs and the bottom of the stairs. If you do not have them, ask someone to add them for you. What else can I do to help prevent falls?  Wear shoes that:  Do not have high heels.  Have rubber bottoms.  Are  comfortable and fit you well.  Are closed at the toe. Do not wear sandals.  If you use a stepladder:  Make sure that it is fully opened. Do not climb a closed stepladder.  Make sure that both sides of the stepladder are locked into place.  Ask someone to hold it for you, if possible.  Clearly mark and make sure that you can see:  Any grab bars or handrails.  First and last steps.  Where the edge of each step is.  Use tools that help you move around (mobility aids) if they are needed. These include:  Canes.  Walkers.  Scooters.  Crutches.  Turn on the lights when you go into a dark area. Replace any light bulbs as soon as they burn out.  Set up your furniture so you have a clear path. Avoid moving your furniture around.  If any of your floors are uneven, fix them.  If there are any pets around you, be aware of where they are.  Review your medicines with your doctor. Some medicines can make you feel dizzy. This can increase your chance of falling. Ask your doctor what other things that you can do to help prevent falls. This information is not intended to replace advice given to you by your health care provider. Make sure you discuss any questions you have with your health care provider. Document Released: 09/05/2009 Document Revised: 04/16/2016 Document Reviewed: 12/14/2014 Elsevier Interactive Patient Education  2017 [employer].

## 2021-02-03 DIAGNOSIS — G4733 Obstructive sleep apnea (adult) (pediatric): Secondary | ICD-10-CM | POA: Diagnosis not present

## 2021-03-03 ENCOUNTER — Other Ambulatory Visit: Payer: Self-pay

## 2021-03-03 ENCOUNTER — Ambulatory Visit
Admission: RE | Admit: 2021-03-03 | Discharge: 2021-03-03 | Disposition: A | Discharge status: Home / Self Care | Payer: Medicare PPO | Source: Ambulatory Visit | Attending: Urology | Admitting: Urology

## 2021-03-03 DIAGNOSIS — N200 Calculus of kidney: Secondary | ICD-10-CM | POA: Diagnosis not present

## 2021-03-06 ENCOUNTER — Ambulatory Visit: Payer: Medicare PPO | Admitting: Family Medicine

## 2021-03-06 DIAGNOSIS — G4733 Obstructive sleep apnea (adult) (pediatric): Secondary | ICD-10-CM | POA: Diagnosis not present

## 2021-03-10 ENCOUNTER — Ambulatory Visit: Payer: Medicare PPO | Admitting: Family Medicine

## 2021-03-10 ENCOUNTER — Encounter: Payer: Self-pay | Admitting: Family Medicine

## 2021-03-10 ENCOUNTER — Other Ambulatory Visit: Payer: Self-pay

## 2021-03-10 VITALS — BP 134/76 | HR 72 | Ht 67.0 in | Wt 194.0 lb

## 2021-03-10 DIAGNOSIS — I10 Essential (primary) hypertension: Secondary | ICD-10-CM

## 2021-03-10 DIAGNOSIS — K2270 Barrett's esophagus without dysplasia: Secondary | ICD-10-CM

## 2021-03-10 DIAGNOSIS — E782 Mixed hyperlipidemia: Secondary | ICD-10-CM | POA: Diagnosis not present

## 2021-03-10 DIAGNOSIS — F329 Major depressive disorder, single episode, unspecified: Secondary | ICD-10-CM

## 2021-03-10 DIAGNOSIS — M1990 Unspecified osteoarthritis, unspecified site: Secondary | ICD-10-CM | POA: Diagnosis not present

## 2021-03-10 MED ORDER — ETODOLAC 400 MG PO TABS: 400.0000 mg | ORAL_TABLET | Freq: Every day | ORAL | 5 refills | Status: DC

## 2021-03-10 MED ORDER — BENAZEPRIL-HYDROCHLOROTHIAZIDE 10-12.5 MG PO TABS: 1.0000 | ORAL_TABLET | Freq: Every day | ORAL | 1 refills | Status: DC

## 2021-03-10 MED ORDER — PANTOPRAZOLE SODIUM 40 MG PO TBEC: 40.0000 mg | DELAYED_RELEASE_TABLET | Freq: Every day | ORAL | 1 refills | Status: DC

## 2021-03-10 MED ORDER — ATORVASTATIN CALCIUM 80 MG PO TABS: 80.0000 mg | ORAL_TABLET | Freq: Every day | ORAL | 1 refills | Status: DC

## 2021-03-10 MED ORDER — SERTRALINE HCL 50 MG PO TABS: 50.0000 mg | ORAL_TABLET | Freq: Every day | ORAL | 3 refills | Status: DC

## 2021-03-10 MED ORDER — METOPROLOL SUCCINATE ER 25 MG PO TB24: 25.0000 mg | ORAL_TABLET | Freq: Every day | ORAL | 1 refills | Status: DC

## 2021-03-10 NOTE — Progress Notes (Signed)
Date:  03/10/2021   Name:  Timothy Hatfield   DOB:  1946-04-29   MRN:  [id number]   Chief Complaint: Hyperlipidemia, Hypertension, Gastroesophageal Reflux, and Irritable Bowel Syndrome  Hyperlipidemia This is a chronic problem. The current episode started more than 1 year ago. The problem is controlled. Recent lipid tests were reviewed and are normal. He has no history of chronic renal disease, diabetes, hypothyroidism, liver disease, obesity or nephrotic syndrome. Factors aggravating his hyperlipidemia include thiazides. Pertinent negatives include no chest pain, focal sensory loss, focal weakness, leg pain, myalgias or shortness of breath. Current antihyperlipidemic treatment includes statins. The current treatment provides moderate improvement of lipids. There are no compliance problems.  Risk factors for coronary artery disease include hypertension.  Hypertension This is a chronic problem. The current episode started more than 1 year ago. The problem has been waxing and waning since onset. The problem is controlled. Pertinent negatives include no blurred vision, chest pain, headaches, neck pain, orthopnea, palpitations or shortness of breath. There are no associated agents to hypertension. Risk factors for coronary artery disease include dyslipidemia. Past treatments include ACE inhibitors. The current treatment provides moderate improvement. There are no compliance problems.  There is no history of angina, kidney disease, CAD/MI, CVA, heart failure, left ventricular hypertrophy, PVD or retinopathy. There is no history of chronic renal disease, a hypertension causing med or renovascular disease.  Gastroesophageal Reflux He reports no abdominal pain, no chest pain, no coughing, no dysphagia, no hoarse voice, no nausea, no sore throat or no wheezing. The current episode started in the past 7 days. The problem has been gradually improving. The symptoms are aggravated by certain foods.    Lab  Results  Component Value Date   CREATININE 1.58 (H) 07/24/2020   BUN 25 (H) 07/24/2020   NA 139 07/24/2020   K 4.0 07/24/2020   CL 102 07/24/2020   CO2 26 07/24/2020   Lab Results  Component Value Date   CHOL 120 09/05/2020   HDL 48 09/05/2020   LDLCALC 54 09/05/2020   TRIG 94 09/05/2020   CHOLHDL 2.6 10/03/2018   No results found for: TSH No results found for: HGBA1C Lab Results  Component Value Date   WBC 7.0 07/24/2020   HGB 14.6 07/24/2020   HCT 42.7 07/24/2020   MCV 94.7 07/24/2020   PLT 261 07/24/2020   Lab Results  Component Value Date   ALT 24 03/27/2020   AST 25 03/27/2020   ALKPHOS 64 03/27/2020   BILITOT 0.9 03/27/2020     Review of Systems  Constitutional: Negative for chills and fever.  HENT: Negative for drooling, ear discharge, ear pain, hoarse voice and sore throat.   Eyes: Negative for blurred vision.  Respiratory: Negative for cough, shortness of breath and wheezing.   Cardiovascular: Negative for chest pain, palpitations, orthopnea and leg swelling.  Gastrointestinal: Negative for abdominal pain, blood in stool, constipation, diarrhea, dysphagia and nausea.  Endocrine: Negative for polydipsia.  Genitourinary: Negative for dysuria, frequency, hematuria and urgency.  Musculoskeletal: Negative for back pain, myalgias and neck pain.  Skin: Negative for rash.  Allergic/Immunologic: Negative for environmental allergies.  Neurological: Negative for dizziness, focal weakness and headaches.  Hematological: Does not bruise/bleed easily.  Psychiatric/Behavioral: Negative for suicidal ideas. The patient is not nervous/anxious.     Patient Active Problem List   Diagnosis Date Noted  . Benign prostatic hyperplasia with lower urinary tract symptoms 01/12/2020  . GERD without esophagitis 03/25/2018  . Moderate episode  of recurrent major depressive disorder ([place]) 03/25/2018  . Acute calcific periarthritis 06/18/2015  . Allergic rhinitis 06/18/2015  .  Arthritis 06/18/2015  . Essential (primary) hypertension 06/18/2015  . 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 HLD (hyperlipidemia) 06/18/2015  . External hemorrhoid 06/18/2015  . Gastroenteritis 06/18/2015  . Bilateral nephrolithiasis 05/15/2014    Allergies  Allergen Reactions  . Bee Venom Anaphylaxis  . Ciprofloxacin Itching    Past Surgical History:  Procedure Laterality Date  . CARDIAC CATHETERIZATION Left 09/09/2016   Procedure: Left Heart Cath and Coronary Angiography;  Surgeon: Yolonda Kida, MD;  Location: [facility];  Service: Cardiovascular;  Laterality: Left;  . COLONOSCOPY  2013   normal/ Dr Vira Agar- cleared for 10 yrs  . COLONOSCOPY WITH PROPOFOL N/A 01/03/2018   Procedure: COLONOSCOPY WITH PROPOFOL;  Surgeon: Manya Silvas, MD;  Location: [facility];  Service: Endoscopy;  Laterality: N/A;  . CORONARY ARTERY BYPASS GRAFT    . CYSTOSCOPY W/ RETROGRADES Bilateral 07/11/2020   Procedure: CYSTOSCOPY WITH RETROGRADE PYELOGRAM;  Surgeon: Billey Co, MD;  Location: [facility];  Service: Urology;  Laterality: Bilateral;  . CYSTOSCOPY W/ RETROGRADES  07/26/2020   Procedure: CYSTOSCOPY WITH RETROGRADE PYELOGRAM;  Surgeon: Billey Co, MD;  Location: [facility];  Service: Urology;;  . Consuela Mimes W/ URETERAL STENT REMOVAL Right 07/26/2020   Procedure: CYSTOSCOPY WITH STENT REMOVAL;  Surgeon: Billey Co, MD;  Location: [facility];  Service: Urology;  Laterality: Right;  . CYSTOSCOPY/URETEROSCOPY/HOLMIUM LASER/STENT PLACEMENT Bilateral 07/11/2020   Procedure: CYSTOSCOPY/URETEROSCOPY/HOLMIUM LASER/STENT PLACEMENT;  Surgeon: Billey Co, MD;  Location: [facility];  Service: Urology;  Laterality: Bilateral;  . CYSTOSCOPY/URETEROSCOPY/HOLMIUM LASER/STENT PLACEMENT Left 07/26/2020   Procedure: CYSTOSCOPY/URETEROSCOPY/HOLMIUM LASER/STENT EXCHANGE;  Surgeon: Billey Co, MD;  Location: [facility];  Service: Urology;  Laterality: Left;  . ESOPHAGOGASTRODUODENOSCOPY  (EGD) WITH PROPOFOL N/A 01/03/2018   Procedure: ESOPHAGOGASTRODUODENOSCOPY (EGD) WITH PROPOFOL;  Surgeon: Manya Silvas, MD;  Location: [facility];  Service: Endoscopy;  Laterality: N/A;  . EXTRACORPOREAL SHOCK WAVE LITHOTRIPSY Left 07/11/2020   Procedure: EXTRACORPOREAL SHOCK WAVE LITHOTRIPSY (ESWL);  Surgeon: Billey Co, MD;  Location: [facility];  Service: Urology;  Laterality: Left;  . EYE SURGERY    . HERNIA REPAIR    . IR NEPHROSTOMY PLACEMENT RIGHT  03/15/2018  . kidney stones     lithotripsy  . LITHOTRIPSY    . NEPHROLITHOTOMY Right 03/15/2018   Procedure: NEPHROLITHOTOMY PERCUTANEOUS;  Surgeon: Abbie Sons, MD;  Location: [facility];  Service: Urology;  Laterality: Right;  . STONE EXTRACTION WITH BASKET  07/11/2020   Procedure: BLADDER STONE EXTRACTION;  Surgeon: Billey Co, MD;  Location: [facility];  Service: Urology;;  . UPPER GI ENDOSCOPY  2013    Social History   Tobacco Use  . Smoking status: Never Smoker  . Smokeless tobacco: Never Used  Vaping Use  . Vaping Use: Never used  Substance Use Topics  . Alcohol use: No    Alcohol/week: 0.0 standard drinks  . Drug use: No     Medication list has been reviewed and updated.  Current Meds  Medication Sig  . acetaminophen (TYLENOL) 500 MG tablet Take 1,000 mg by mouth daily as needed for moderate pain.  Marland Kitchen aspirin EC 81 MG tablet Take 81 mg by mouth daily.  Marland Kitchen atorvastatin (LIPITOR) 80 MG tablet Take 1 tablet (80 mg total) by mouth daily.  . benazepril-hydrochlorthiazide (LOTENSIN HCT) 10-12.5 MG tablet Take 1 tablet by mouth daily.  . Coenzyme  Q10 (COQ-10) 100 MG CAPS Take 100 mg by mouth at bedtime.  . dicyclomine (BENTYL) 10 MG capsule TAKE 1 CAPSULE BY MOUTH EVERY MORNING AND TAKE 1 CAPSULE AT BEDTIME  . Dutasteride-Tamsulosin HCl 0.5-0.4 MG CAPS Take 1 capsule by mouth daily. (Patient taking differently: Take 1 capsule by mouth daily. sninsky)  . EPINEPHrine 0.3 mg/0.3 mL IJ SOAJ injection Inject 0.3  mLs (0.3 mg total) into the muscle as needed for anaphylaxis.  Marland Kitchen etodolac (LODINE) 400 MG tablet TAKE ONE TABLET BY MOUTH EVERY DAY  . fluticasone (FLONASE) 50 MCG/ACT nasal spray Place 1 spray into both nostrils daily. otc  . metoprolol succinate (TOPROL-XL) 25 MG 24 hr tablet Take 1 tablet (25 mg total) by mouth daily.  . Multiple Vitamin (MULTIVITAMIN WITH MINERALS) TABS tablet Take 1 tablet by mouth at bedtime.  . mupirocin ointment (BACTROBAN) 2 % Apply 1 application topically 2 (two) times daily. (Patient taking differently: Apply 1 application topically 2 (two) times daily as needed (irritation).)  . naproxen sodium (ALEVE) 220 MG tablet Take 440 mg by mouth daily as needed (pain).  . nitroGLYCERIN (NITROSTAT) 0.4 MG SL tablet Place 1 tablet (0.4 mg total) under the tongue every 5 (five) minutes as needed for chest pain.  . pantoprazole (PROTONIX) 40 MG tablet Take 1 tablet (40 mg total) by mouth daily.    PHQ 2/9 Scores 03/10/2021 01/29/2021 09/05/2020 05/28/2020  PHQ - 2 Score 2 0 0 0  PHQ- 9 Score 3 - 0 0    GAD 7 : Generalized Anxiety Score 03/10/2021 09/05/2020 05/28/2020 03/27/2020  Nervous, Anxious, on Edge 0 0 0 0  Control/stop worrying 0 0 0 0  Worry too much - different things 0 0 0 0  Trouble relaxing 0 0 0 0  Restless 0 0 0 0  Easily annoyed or irritable 1 0 0 0  Afraid - awful might happen 0 0 0 0  Total GAD 7 Score 1 0 0 0  Anxiety Difficulty Not difficult at all - - -    BP Readings from Last 3 Encounters:  03/10/21 134/76  09/19/20 (!) 175/69  09/05/20 132/78    Physical Exam Vitals and nursing note reviewed.  HENT:     Head: Normocephalic.     Right Ear: Tympanic membrane, ear canal and external ear normal.     Left Ear: Tympanic membrane, ear canal and external ear normal.     Nose: Nose normal. No congestion or rhinorrhea.  Eyes:     General: No scleral icterus.       Right eye: No discharge.        Left eye: No discharge.     Conjunctiva/sclera:  Conjunctivae normal.     Pupils: Pupils are equal, round, and reactive to light.  Neck:     Thyroid: No thyromegaly.     Vascular: No JVD.     Trachea: No tracheal deviation.  Cardiovascular:     Rate and Rhythm: Normal rate and regular rhythm.     Heart sounds: Normal heart sounds. No murmur heard. No friction rub. No gallop.   Pulmonary:     Effort: No respiratory distress.     Breath sounds: Normal breath sounds. No wheezing, rhonchi or rales.  Chest:     Chest wall: No tenderness.  Abdominal:     General: Bowel sounds are normal.     Palpations: Abdomen is soft. There is no mass.     Tenderness: There is no abdominal tenderness.  There is no guarding or rebound.  Musculoskeletal:        General: No tenderness. Normal range of motion.     Cervical back: Normal range of motion and neck supple.  Lymphadenopathy:     Cervical: No cervical adenopathy.  Skin:    General: Skin is warm.     Findings: No rash.  Neurological:     Mental Status: He is alert and oriented to person, place, and time.     Cranial Nerves: No cranial nerve deficit.     Deep Tendon Reflexes: Reflexes are normal and symmetric.     Wt Readings from Last 3 Encounters:  03/10/21 194 lb (88 kg)  09/19/20 184 lb (83.5 kg)  09/05/20 186 lb (84.4 kg)    BP 134/76   Pulse 72   Ht 5\' 7"  (1.702 m)   Wt 194 lb (88 kg)   BMI 30.38 kg/m   Assessment and Plan:  1. Essential (primary) hypertension Chronic.  Controlled.  Stable.  Blood pressure today is 134/76.  We will continue benazepril hydrochlorothiazide 10-12.5 mg daily and metoprolol XL 1 a day.  Will recheck renal panel in 6 to 8 weeks when he returns for reevaluation. - benazepril-hydrochlorthiazide (LOTENSIN HCT) 10-12.5 MG tablet; Take 1 tablet by mouth daily.  Dispense: 90 tablet; Refill: 1 - metoprolol succinate (TOPROL-XL) 25 MG 24 hr tablet; Take 1 tablet (25 mg total) by mouth daily.  Dispense: 90 tablet; Refill: 1  2. Mixed  hyperlipidemia Chronic.  Controlled.  Stable.  Continue atorvastatin 80 mg once a day.  Will check lipid panel fasting when he returns for recheck in 8 weeks. - atorvastatin (LIPITOR) 80 MG tablet; Take 1 tablet (80 mg total) by mouth daily.  Dispense: 90 tablet; Refill: 1  3. Barrett's esophagus without dysplasia Patient with history of Barrett's esophagitis without dysplasia will continue Protonix 40 mg once a day. - pantoprazole (PROTONIX) 40 MG tablet; Take 1 tablet (40 mg total) by mouth daily.  Dispense: 90 tablet; Refill: 1  4. Arthritis Chronic.  Controlled.  Stable.  Continue etodolac 400 mg twice a day for arthritis as needed. - etodolac (LODINE) 400 MG tablet; Take 1 tablet (400 mg total) by mouth daily.  Dispense: 30 tablet; Refill: 5  5. Reactive depression New onset.  Recurrent.  Presently active.  With elevated PHQ.  Patient's wife has had a recurrence of her breast cancer and patient has been having a lot of responsibility for her care.  Patient used to be on sertraline 50 mg and would like to resume.  We will resume sertraline 50 mg 1/2 tablet for 2 weeks and increase to 50 mg daily.  We will recheck patient in 6 to 8 weeks and reassess PHQ. - sertraline (ZOLOFT) 50 MG tablet; Take 1 tablet (50 mg total) by mouth daily. Initiate one half q day for 2 weeks then 1 a day  Dispense: 30 tablet; Refill: 3

## 2021-03-12 DIAGNOSIS — I25118 Atherosclerotic heart disease of native coronary artery with other forms of angina pectoris: Secondary | ICD-10-CM | POA: Diagnosis not present

## 2021-03-12 DIAGNOSIS — Z9989 Dependence on other enabling machines and devices: Secondary | ICD-10-CM | POA: Diagnosis not present

## 2021-03-12 DIAGNOSIS — Z951 Presence of aortocoronary bypass graft: Secondary | ICD-10-CM | POA: Diagnosis not present

## 2021-03-12 DIAGNOSIS — I2510 Atherosclerotic heart disease of native coronary artery without angina pectoris: Secondary | ICD-10-CM | POA: Diagnosis not present

## 2021-03-12 DIAGNOSIS — I10 Essential (primary) hypertension: Secondary | ICD-10-CM | POA: Diagnosis not present

## 2021-03-12 DIAGNOSIS — K219 Gastro-esophageal reflux disease without esophagitis: Secondary | ICD-10-CM | POA: Diagnosis not present

## 2021-03-12 DIAGNOSIS — N200 Calculus of kidney: Secondary | ICD-10-CM | POA: Diagnosis not present

## 2021-03-12 DIAGNOSIS — E782 Mixed hyperlipidemia: Secondary | ICD-10-CM | POA: Diagnosis not present

## 2021-03-12 DIAGNOSIS — G4733 Obstructive sleep apnea (adult) (pediatric): Secondary | ICD-10-CM | POA: Diagnosis not present

## 2021-03-18 ENCOUNTER — Ambulatory Visit
Admission: RE | Admit: 2021-03-18 | Discharge: 2021-03-18 | Disposition: A | Discharge status: Home / Self Care | Payer: Medicare PPO | Source: Ambulatory Visit | Attending: Urology | Admitting: Urology

## 2021-03-18 ENCOUNTER — Ambulatory Visit
Admission: RE | Admit: 2021-03-18 | Discharge: 2021-03-18 | Disposition: A | Discharge status: Home / Self Care | Payer: Medicare PPO | Attending: Urology | Admitting: Urology

## 2021-03-18 DIAGNOSIS — N200 Calculus of kidney: Secondary | ICD-10-CM

## 2021-03-18 DIAGNOSIS — N2889 Other specified disorders of kidney and ureter: Secondary | ICD-10-CM | POA: Diagnosis not present

## 2021-03-19 ENCOUNTER — Other Ambulatory Visit: Payer: Self-pay

## 2021-03-19 ENCOUNTER — Ambulatory Visit: Payer: Medicare PPO | Admitting: Urology

## 2021-03-19 ENCOUNTER — Encounter: Payer: Self-pay | Admitting: Urology

## 2021-03-19 VITALS — BP 177/89 | HR 76 | Ht 67.0 in | Wt 190.0 lb

## 2021-03-19 DIAGNOSIS — N200 Calculus of kidney: Secondary | ICD-10-CM | POA: Diagnosis not present

## 2021-03-19 DIAGNOSIS — N401 Enlarged prostate with lower urinary tract symptoms: Secondary | ICD-10-CM

## 2021-03-19 DIAGNOSIS — N138 Other obstructive and reflux uropathy: Secondary | ICD-10-CM | POA: Diagnosis not present

## 2021-03-19 NOTE — Patient Instructions (Signed)

## 2021-03-19 NOTE — Progress Notes (Signed)
   03/19/2021 4:01 PM   Timothy Hatfield 1946-10-11 [id number]  Reason for visit: Follow up nephrolithiasis, BPH  HPI: I saw Timothy Hatfield in clinic for follow-up of the above issues.  He is a 75 year old male who underwent staged bilateral ureteroscopy for significant bilateral stone burden.  Stents have since been removed, and he denies any stone episodes or problems since that time.  He has had multiple 24-hour urines previously that showed low urine volume, and he deferred medication like potassium citrate or a repeat 24-hour urine.  Stone analysis that showed 50% calcium oxalate and 50% uric acid previously.  He has been increasing citrate in the diet and increasing fluid intake.  I personally reviewed his renal ultrasound and KUB today that shows no hydronephrosis, and likely a small 4 to 5 mm right midpole nonobstructing stone.  We discussed general stone prevention strategies including adequate hydration with goal of producing 2.5 L of urine daily, increasing citric acid intake, increasing calcium intake during high oxalate meals, minimizing animal protein, and decreasing salt intake. Information about dietary recommendations given today.   Regarding his BPH, symptoms are well controlled on dutasteride-tamsulosin.  He denies any incontinence, and is currently satisfied with his urinary symptoms.  Continue dutasteride-tamsulosin Stone prevention strategies discussed-> consider potassium citrate in the future if recurrent stones RTC 1 year with KUB prior, IPSS, PVR  Billey Co, MD  [facility] [address] [place], [place] [address] [phone]

## 2021-05-22 ENCOUNTER — Encounter: Payer: Self-pay | Admitting: Family Medicine

## 2021-05-22 ENCOUNTER — Ambulatory Visit: Payer: Self-pay

## 2021-05-22 ENCOUNTER — Other Ambulatory Visit: Payer: Self-pay

## 2021-05-22 ENCOUNTER — Ambulatory Visit: Payer: Medicare PPO | Admitting: Family Medicine

## 2021-05-22 VITALS — BP 146/80 | HR 62 | Temp 98.2°F | Wt 196.0 lb

## 2021-05-22 DIAGNOSIS — L03114 Cellulitis of left upper limb: Secondary | ICD-10-CM

## 2021-05-22 DIAGNOSIS — T63481A Toxic effect of venom of other arthropod, accidental (unintentional), initial encounter: Secondary | ICD-10-CM | POA: Diagnosis not present

## 2021-05-22 DIAGNOSIS — Z91038 Other insect allergy status: Secondary | ICD-10-CM

## 2021-05-22 DIAGNOSIS — T63441A Toxic effect of venom of bees, accidental (unintentional), initial encounter: Secondary | ICD-10-CM

## 2021-05-22 MED ORDER — LORATADINE 10 MG PO TABS: 10.0000 mg | ORAL_TABLET | Freq: Every day | ORAL | 11 refills | Status: DC

## 2021-05-22 MED ORDER — DOXYCYCLINE HYCLATE 100 MG PO TABS: 100.0000 mg | ORAL_TABLET | Freq: Two times a day (BID) | ORAL | 0 refills | Status: DC

## 2021-05-22 MED ORDER — HYDROXYZINE HCL 10 MG PO TABS: 10.0000 mg | ORAL_TABLET | Freq: Every evening | ORAL | 0 refills | Status: DC

## 2021-05-22 MED ORDER — EPINEPHRINE 0.3 MG/0.3ML IJ SOAJ: 0.3000 mg | INTRAMUSCULAR | 1 refills | Status: AC | PRN

## 2021-05-22 NOTE — Telephone Encounter (Signed)
Pt. Had one yellow jacket sting yesterday to left hand. Had anaphylaxis reaction in the past after being "stung 18 times." Did not use his epi-pen yesterday. Left hand is swollen and "very itchy." Requests to be seen this morning if possible.Warm transfer to [facility] in the practice.

## 2021-05-22 NOTE — Progress Notes (Signed)
Date:  05/22/2021   Name:  Timothy Hatfield   DOB:  [birth year]   MRN:  [id number]   Chief Complaint: Insect Bite (X 1 day, left hand, arm, Bee sting epi pen expired 01/2020, pt still has swelling, took benadryl today  )  Patient is a 75 year old male who presents for a evaluation after beestring exam. The patient reports the following problems: cellulitis. Health maintenance has been reviewed up to date.     Lab Results  Component Value Date   CREATININE 1.58 (H) 07/24/2020   BUN 25 (H) 07/24/2020   NA 139 07/24/2020   K 4.0 07/24/2020   CL 102 07/24/2020   CO2 26 07/24/2020   Lab Results  Component Value Date   CHOL 120 09/05/2020   HDL 48 09/05/2020   LDLCALC 54 09/05/2020   TRIG 94 09/05/2020   CHOLHDL 2.6 10/03/2018   No results found for: TSH No results found for: HGBA1C Lab Results  Component Value Date   WBC 7.0 07/24/2020   HGB 14.6 07/24/2020   HCT 42.7 07/24/2020   MCV 94.7 07/24/2020   PLT 261 07/24/2020   Lab Results  Component Value Date   ALT 24 03/27/2020   AST 25 03/27/2020   ALKPHOS 64 03/27/2020   BILITOT 0.9 03/27/2020     Review of Systems  Constitutional:  Negative for chills and fever.  HENT:  Negative for drooling, ear discharge, ear pain and sore throat.   Respiratory:  Negative for cough, shortness of breath and wheezing.   Cardiovascular:  Negative for chest pain, palpitations and leg swelling.  Gastrointestinal:  Negative for abdominal pain, blood in stool, constipation, diarrhea and nausea.  Endocrine: Negative for polydipsia.  Genitourinary:  Negative for dysuria, frequency, hematuria and urgency.  Musculoskeletal:  Negative for back pain, myalgias and neck pain.  Skin:  Negative for rash.  Allergic/Immunologic: Negative for environmental allergies.  Neurological:  Negative for dizziness and headaches.  Hematological:  Does not bruise/bleed easily.  Psychiatric/Behavioral:  Negative for suicidal ideas. The patient is not  nervous/anxious.    Patient Active Problem List   Diagnosis Date Noted   Benign prostatic hyperplasia with lower urinary tract symptoms 01/12/2020   GERD without esophagitis 03/25/2018   Moderate episode of recurrent major depressive disorder ([place]) 03/25/2018   Acute calcific periarthritis 06/18/2015   Allergic rhinitis 06/18/2015   Arthritis 06/18/2015   Essential (primary) hypertension 06/18/2015   Barrett esophagus 06/18/2015   HLD (hyperlipidemia) 06/18/2015   External hemorrhoid 06/18/2015   Gastroenteritis 06/18/2015   Bilateral nephrolithiasis 05/15/2014    Allergies  Allergen Reactions   Bee Venom Anaphylaxis   Ciprofloxacin Itching    Past Surgical History:  Procedure Laterality Date   CARDIAC CATHETERIZATION Left 09/09/2016   Procedure: Left Heart Cath and Coronary Angiography;  Surgeon: Yolonda Kida, MD;  Location: [place] [facility];  Service: Cardiovascular;  Laterality: Left;   COLONOSCOPY  2013   normal/ Dr Vira Agar- cleared for 10 yrs   COLONOSCOPY WITH PROPOFOL N/A 01/03/2018   Procedure: COLONOSCOPY WITH PROPOFOL;  Surgeon: Manya Silvas, MD;  Location: [facility];  Service: Endoscopy;  Laterality: N/A;   CORONARY ARTERY BYPASS GRAFT     CYSTOSCOPY W/ RETROGRADES Bilateral 07/11/2020   Procedure: CYSTOSCOPY WITH RETROGRADE PYELOGRAM;  Surgeon: Billey Co, MD;  Location: [facility];  Service: Urology;  Laterality: Bilateral;   CYSTOSCOPY W/ RETROGRADES  07/26/2020   Procedure: CYSTOSCOPY WITH RETROGRADE PYELOGRAM;  Surgeon: Diamantina Providence,  Herbert Seta, MD;  Location: [facility];  Service: Urology;;   CYSTOSCOPY W/ URETERAL STENT REMOVAL Right 07/26/2020   Procedure: CYSTOSCOPY WITH STENT REMOVAL;  Surgeon: Billey Co, MD;  Location: [facility];  Service: Urology;  Laterality: Right;   CYSTOSCOPY/URETEROSCOPY/HOLMIUM LASER/STENT PLACEMENT Bilateral 07/11/2020   Procedure: CYSTOSCOPY/URETEROSCOPY/HOLMIUM LASER/STENT PLACEMENT;  Surgeon: Billey Co, MD;   Location: [facility];  Service: Urology;  Laterality: Bilateral;   CYSTOSCOPY/URETEROSCOPY/HOLMIUM LASER/STENT PLACEMENT Left 07/26/2020   Procedure: CYSTOSCOPY/URETEROSCOPY/HOLMIUM LASER/STENT EXCHANGE;  Surgeon: Billey Co, MD;  Location: [facility];  Service: Urology;  Laterality: Left;   ESOPHAGOGASTRODUODENOSCOPY (EGD) WITH PROPOFOL N/A 01/03/2018   Procedure: ESOPHAGOGASTRODUODENOSCOPY (EGD) WITH PROPOFOL;  Surgeon: Manya Silvas, MD;  Location: [facility] ENDOSCOPY;  Service: Endoscopy;  Laterality: N/A;   EXTRACORPOREAL SHOCK WAVE LITHOTRIPSY Left 07/11/2020   Procedure: EXTRACORPOREAL SHOCK WAVE LITHOTRIPSY (ESWL);  Surgeon: Billey Co, MD;  Location: [facility];  Service: Urology;  Laterality: Left;   EYE SURGERY     HERNIA REPAIR     IR NEPHROSTOMY PLACEMENT RIGHT  03/15/2018   kidney stones     lithotripsy   LITHOTRIPSY     NEPHROLITHOTOMY Right 03/15/2018   Procedure: NEPHROLITHOTOMY PERCUTANEOUS;  Surgeon: Abbie Sons, MD;  Location: [facility];  Service: Urology;  Laterality: Right;   STONE EXTRACTION WITH BASKET  07/11/2020   Procedure: BLADDER STONE EXTRACTION;  Surgeon: Billey Co, MD;  Location: [facility];  Service: Urology;;   UPPER GI ENDOSCOPY  2013    Social History   Tobacco Use   Smoking status: Never   Smokeless tobacco: Never  Vaping Use   Vaping Use: Never used  Substance Use Topics   Alcohol use: No    Alcohol/week: 0.0 standard drinks   Drug use: No     Medication list has been reviewed and updated.  Current Meds  Medication Sig   acetaminophen (TYLENOL) 500 MG tablet Take 1,000 mg by mouth daily as needed for moderate pain.   aspirin EC 81 MG tablet Take 81 mg by mouth daily.   atorvastatin (LIPITOR) 80 MG tablet Take 1 tablet (80 mg total) by mouth daily.   benazepril-hydrochlorthiazide (LOTENSIN HCT) 10-12.5 MG tablet Take 1 tablet by mouth daily.   Coenzyme Q10 (COQ-10) 100 MG CAPS Take 100 mg by mouth at bedtime.   dicyclomine  (BENTYL) 10 MG capsule TAKE 1 CAPSULE BY MOUTH EVERY MORNING AND TAKE 1 CAPSULE AT BEDTIME   Dutasteride-Tamsulosin HCl 0.5-0.4 MG CAPS Take 1 capsule by mouth daily. (Patient taking differently: Take 1 capsule by mouth daily. sninsky)   EPINEPHrine 0.3 mg/0.3 mL IJ SOAJ injection Inject 0.3 mLs (0.3 mg total) into the muscle as needed for anaphylaxis.   etodolac (LODINE) 400 MG tablet Take 1 tablet (400 mg total) by mouth daily.   fluticasone (FLONASE) 50 MCG/ACT nasal spray Place 1 spray into both nostrils daily. otc   metoprolol succinate (TOPROL-XL) 25 MG 24 hr tablet Take 1 tablet (25 mg total) by mouth daily.   Multiple Vitamin (MULTIVITAMIN WITH MINERALS) TABS tablet Take 1 tablet by mouth at bedtime.   mupirocin ointment (BACTROBAN) 2 % Apply 1 application topically 2 (two) times daily. (Patient taking differently: Apply 1 application topically 2 (two) times daily as needed (irritation).)   naproxen sodium (ALEVE) 220 MG tablet Take 440 mg by mouth daily as needed (pain).   nitroGLYCERIN (NITROSTAT) 0.4 MG SL tablet Place 1 tablet (0.4 mg total) under the tongue every 5 (five) minutes  as needed for chest pain.   pantoprazole (PROTONIX) 40 MG tablet Take 1 tablet (40 mg total) by mouth daily.   sertraline (ZOLOFT) 50 MG tablet Take 1 tablet (50 mg total) by mouth daily. Initiate one half q day for 2 weeks then 1 a day    PHQ 2/9 Scores 05/22/2021 03/10/2021 01/29/2021 09/05/2020  PHQ - 2 Score 0 2 0 0  PHQ- 9 Score 0 3 - 0    GAD 7 : Generalized Anxiety Score 05/22/2021 03/10/2021 09/05/2020 05/28/2020  Nervous, Anxious, on Edge 1 0 0 0  Control/stop worrying 1 0 0 0  Worry too much - different things 1 0 0 0  Trouble relaxing 0 0 0 0  Restless 0 0 0 0  Easily annoyed or irritable 0 1 0 0  Afraid - awful might happen 0 0 0 0  Total GAD 7 Score 3 1 0 0  Anxiety Difficulty - Not difficult at all - -    BP Readings from Last 3 Encounters:  05/22/21 (!) 146/80  03/19/21 (!) 177/89   03/10/21 134/76    Physical Exam Vitals and nursing note reviewed.  HENT:     Head: Normocephalic.     Right Ear: Tympanic membrane and external ear normal.     Left Ear: Tympanic membrane and external ear normal.     Nose: Nose normal.  Eyes:     General: No scleral icterus.       Right eye: No discharge.        Left eye: No discharge.     Conjunctiva/sclera: Conjunctivae normal.     Pupils: Pupils are equal, round, and reactive to light.  Neck:     Thyroid: No thyromegaly.     Vascular: No JVD.     Trachea: No tracheal deviation.  Cardiovascular:     Rate and Rhythm: Normal rate and regular rhythm.     Heart sounds: Normal heart sounds. No murmur heard.   No friction rub. No gallop.  Pulmonary:     Effort: No respiratory distress.     Breath sounds: Normal breath sounds. No wheezing or rales.  Abdominal:     General: Bowel sounds are normal.     Palpations: Abdomen is soft. There is no mass.     Tenderness: There is no abdominal tenderness. There is no guarding or rebound.  Musculoskeletal:        General: No tenderness. Normal range of motion.     Cervical back: Normal range of motion and neck supple.  Lymphadenopathy:     Cervical: No cervical adenopathy.  Skin:    General: Skin is warm.     Findings: Erythema present. No rash.     Comments: Localized swelling  Neurological:     Mental Status: He is alert and oriented to person, place, and time.     Cranial Nerves: No cranial nerve deficit.     Deep Tendon Reflexes: Reflexes are normal and symmetric.    Wt Readings from Last 3 Encounters:  05/22/21 196 lb (88.9 kg)  03/19/21 190 lb (86.2 kg)  03/10/21 194 lb (88 kg)    BP (!) 146/80   Pulse 62   Temp 98.2 F (36.8 C) (Oral)   Wt 196 lb (88.9 kg)   BMI 30.70 kg/m   Assessment and Plan: 1. Cellulitis of left upper extremity New onset.  Persistent.  Relative stable.  Complicated.  Patient is uncertain exactly what stung him he because it yellowjacket  but it seems like it was a small and may have been more of a wasp variety which concerns me for secondary infection so we will treat this as a cellulitis with doxycycline 100 mg twice a day. - doxycycline (VIBRA-TABS) 100 MG tablet; Take 1 tablet (100 mg total) by mouth 2 (two) times daily.  Dispense: 20 tablet; Refill: 0  2. Local reaction to insect sting, accidental or unintentional, initial encounter Patient is a local reaction to the insect sting he took his EpiPen and has taken some Benadryl but we will continue with loratadine 10 mg in the morning and hydroxyzine at night 10 mg. - hydrOXYzine (ATARAX/VISTARIL) 10 MG tablet; Take 1 tablet (10 mg total) by mouth at bedtime. For 2 week  Dispense: 30 tablet; Refill: 0 - loratadine (CLARITIN) 10 MG tablet; Take 1 tablet (10 mg total) by mouth daily. For allergies  Dispense: 30 tablet; Refill: 11  3. History of insect sting allergy Patient does have a history of insect stings for which he needs a refill on his epinephrine.  4. Allergic reaction to bee sting EpiPen x2 dispense with a refill to be used upon sting and insect encounter in the future. - EPINEPHrine 0.3 mg/0.3 mL IJ SOAJ injection; Inject 0.3 mg into the muscle as needed for anaphylaxis.  Dispense: 2 each; Refill: 1

## 2021-05-22 NOTE — Telephone Encounter (Signed)
Reason for Disposition . [1] Red or very tender (to touch) area AND [2] getting larger over 48 hours after the sting  Answer Assessment - Initial Assessment Questions 1. TYPE: "What type of sting was it?" (bee, yellow jacket, etc.)      Yellow jacket 2. ONSET: "When did it occur?"      Left hand - yesterday 3. LOCATION: "Where is the sting located?"  "How many stings?"     One sting 4. SWELLING SIZE: "How big is the swelling?" (e.g., inches or cm)     Moderate 5. REDNESS: "Is the area red or pink?" If Yes, ask: "What size is area of redness?" (e.g., inches or cm). "When did the redness start?"     Hand is red 6. PAIN: "Is there any pain?" If Yes, ask: "How bad is it?"  (Scale 1-10; or mild, moderate, severe)     Mild 7. ITCHING: "Is there any itching?" If Yes, ask: "How bad is it?"      Severe 8. RESPIRATORY DISTRESS: "Describe your breathing."     No 9. PRIOR REACTIONS: "Have you had any severe allergic reactions to stings in the past?" if yes, ask: "What happened?"     Yes - Anaphylaxis  10. OTHER SYMPTOMS: "Do you have any other symptoms?" (e.g., abdominal pain, face or tongue swelling, new rash elsewhere, vomiting)       No 11. PREGNANCY: "Is there any chance you are pregnant?" "When was your last menstrual period?"       N/a  Protocols used: Bee or Yellow Jacket Sting-A-AH

## 2021-06-02 ENCOUNTER — Other Ambulatory Visit: Payer: Self-pay | Admitting: Family Medicine

## 2021-06-02 NOTE — Telephone Encounter (Signed)
Copied from [place] [id number]. Topic: Quick Communication - Rx Refill/Question >> Jun 02, 2021  2:59 PM Tessa Lerner A wrote: Medication: nitroGLYCERIN (NITROSTAT) 0.4 MG SL tablet   Has the patient contacted their pharmacy? Yes.   (Agent: If no, request that the patient contact the pharmacy for the refill.) (Agent: If yes, when and what did the pharmacy advise?)  Preferred Pharmacy (with phone number or street name): [place], [place] - [address]  Phone:  [phone] Fax:  [phone]  Agent: Please be advised that RX refills may take up to 3 business days. We ask that you follow-up with your pharmacy.

## 2021-06-03 MED ORDER — NITROGLYCERIN 0.4 MG SL SUBL: 0.4000 mg | SUBLINGUAL_TABLET | SUBLINGUAL | 3 refills | Status: AC | PRN

## 2021-06-11 ENCOUNTER — Other Ambulatory Visit: Payer: Self-pay | Admitting: Family Medicine

## 2021-06-11 DIAGNOSIS — K2270 Barrett's esophagus without dysplasia: Secondary | ICD-10-CM

## 2021-06-11 DIAGNOSIS — I10 Essential (primary) hypertension: Secondary | ICD-10-CM

## 2021-06-20 ENCOUNTER — Ambulatory Visit: Payer: Medicare PPO | Admitting: Family Medicine

## 2021-06-20 ENCOUNTER — Other Ambulatory Visit: Payer: Self-pay

## 2021-06-20 ENCOUNTER — Encounter: Payer: Self-pay | Admitting: Family Medicine

## 2021-06-20 VITALS — BP 138/80 | HR 64 | Ht 67.0 in | Wt 197.0 lb

## 2021-06-20 DIAGNOSIS — M1712 Unilateral primary osteoarthritis, left knee: Secondary | ICD-10-CM | POA: Diagnosis not present

## 2021-06-20 MED ORDER — MELOXICAM 15 MG PO TABS: 15.0000 mg | ORAL_TABLET | Freq: Every day | ORAL | 2 refills | Status: DC

## 2021-06-20 NOTE — Progress Notes (Signed)
Date:  06/20/2021   Name:  Timothy Hatfield   DOB:  March 17, 1946   MRN:  [id number]   Chief Complaint: Leg Pain (L) leg pain going all the way down leg- started 1 week ago.)  Leg Pain  The incident occurred more than 1 week ago. There was no injury mechanism. The pain is present in the left leg and left knee. The quality of the pain is described as aching. The pain is at a severity of 3/10. The pain is mild. The pain has been Intermittent since onset. Pertinent negatives include no inability to bear weight, loss of motion, loss of sensation, muscle weakness, numbness or tingling. He reports no foreign bodies present. Exacerbated by: walking or climbing stairs. He has tried NSAIDs and rest for the symptoms. The treatment provided significant relief.   Lab Results  Component Value Date   CREATININE 1.58 (H) 07/24/2020   BUN 25 (H) 07/24/2020   NA 139 07/24/2020   K 4.0 07/24/2020   CL 102 07/24/2020   CO2 26 07/24/2020   Lab Results  Component Value Date   CHOL 120 09/05/2020   HDL 48 09/05/2020   LDLCALC 54 09/05/2020   TRIG 94 09/05/2020   CHOLHDL 2.6 10/03/2018   No results found for: TSH No results found for: HGBA1C Lab Results  Component Value Date   WBC 7.0 07/24/2020   HGB 14.6 07/24/2020   HCT 42.7 07/24/2020   MCV 94.7 07/24/2020   PLT 261 07/24/2020   Lab Results  Component Value Date   ALT 24 03/27/2020   AST 25 03/27/2020   ALKPHOS 64 03/27/2020   BILITOT 0.9 03/27/2020     Review of Systems  Constitutional:  Negative for chills and fever.  HENT:  Negative for drooling, ear discharge, ear pain and sore throat.   Respiratory:  Negative for cough, shortness of breath and wheezing.   Cardiovascular:  Negative for chest pain, palpitations and leg swelling.  Gastrointestinal:  Negative for abdominal pain, blood in stool, constipation, diarrhea and nausea.  Endocrine: Negative for polydipsia.  Genitourinary:  Negative for dysuria, frequency, hematuria and  urgency.  Musculoskeletal:  Negative for back pain, myalgias and neck pain.  Skin:  Negative for rash.  Allergic/Immunologic: Negative for environmental allergies.  Neurological:  Negative for dizziness, tingling, numbness and headaches.  Hematological:  Does not bruise/bleed easily.  Psychiatric/Behavioral:  Negative for suicidal ideas. The patient is not nervous/anxious.    Patient Active Problem List   Diagnosis Date Noted   Benign prostatic hyperplasia with lower urinary tract symptoms 01/12/2020   GERD without esophagitis 03/25/2018   Moderate episode of recurrent major depressive disorder ([place]) 03/25/2018   Acute calcific periarthritis 06/18/2015   Allergic rhinitis 06/18/2015   Arthritis 06/18/2015   Essential (primary) hypertension 06/18/2015   Barrett esophagus 06/18/2015   HLD (hyperlipidemia) 06/18/2015   External hemorrhoid 06/18/2015   Gastroenteritis 06/18/2015   Bilateral nephrolithiasis 05/15/2014    Allergies  Allergen Reactions   Bee Venom Anaphylaxis   Ciprofloxacin Itching    Past Surgical History:  Procedure Laterality Date   CARDIAC CATHETERIZATION Left 09/09/2016   Procedure: Left Heart Cath and Coronary Angiography;  Surgeon: Yolonda Kida, MD;  Location: [place] [facility];  Service: Cardiovascular;  Laterality: Left;   COLONOSCOPY  2013   normal/ Dr Vira Agar- cleared for 10 yrs   COLONOSCOPY WITH PROPOFOL N/A 01/03/2018   Procedure: COLONOSCOPY WITH PROPOFOL;  Surgeon: Manya Silvas, MD;  Location: [facility]  ENDOSCOPY;  Service: Endoscopy;  Laterality: N/A;   CORONARY ARTERY BYPASS GRAFT     CYSTOSCOPY W/ RETROGRADES Bilateral 07/11/2020   Procedure: CYSTOSCOPY WITH RETROGRADE PYELOGRAM;  Surgeon: Billey Co, MD;  Location: [facility];  Service: Urology;  Laterality: Bilateral;   CYSTOSCOPY W/ RETROGRADES  07/26/2020   Procedure: CYSTOSCOPY WITH RETROGRADE PYELOGRAM;  Surgeon: Billey Co, MD;  Location: [facility];  Service: Urology;;    CYSTOSCOPY W/ URETERAL STENT REMOVAL Right 07/26/2020   Procedure: CYSTOSCOPY WITH STENT REMOVAL;  Surgeon: Billey Co, MD;  Location: [facility];  Service: Urology;  Laterality: Right;   CYSTOSCOPY/URETEROSCOPY/HOLMIUM LASER/STENT PLACEMENT Bilateral 07/11/2020   Procedure: CYSTOSCOPY/URETEROSCOPY/HOLMIUM LASER/STENT PLACEMENT;  Surgeon: Billey Co, MD;  Location: [facility];  Service: Urology;  Laterality: Bilateral;   CYSTOSCOPY/URETEROSCOPY/HOLMIUM LASER/STENT PLACEMENT Left 07/26/2020   Procedure: CYSTOSCOPY/URETEROSCOPY/HOLMIUM LASER/STENT EXCHANGE;  Surgeon: Billey Co, MD;  Location: [facility];  Service: Urology;  Laterality: Left;   ESOPHAGOGASTRODUODENOSCOPY (EGD) WITH PROPOFOL N/A 01/03/2018   Procedure: ESOPHAGOGASTRODUODENOSCOPY (EGD) WITH PROPOFOL;  Surgeon: Manya Silvas, MD;  Location: [facility] ENDOSCOPY;  Service: Endoscopy;  Laterality: N/A;   EXTRACORPOREAL SHOCK WAVE LITHOTRIPSY Left 07/11/2020   Procedure: EXTRACORPOREAL SHOCK WAVE LITHOTRIPSY (ESWL);  Surgeon: Billey Co, MD;  Location: [facility];  Service: Urology;  Laterality: Left;   EYE SURGERY     HERNIA REPAIR     IR NEPHROSTOMY PLACEMENT RIGHT  03/15/2018   kidney stones     lithotripsy   LITHOTRIPSY     NEPHROLITHOTOMY Right 03/15/2018   Procedure: NEPHROLITHOTOMY PERCUTANEOUS;  Surgeon: Abbie Sons, MD;  Location: [facility];  Service: Urology;  Laterality: Right;   STONE EXTRACTION WITH BASKET  07/11/2020   Procedure: BLADDER STONE EXTRACTION;  Surgeon: Billey Co, MD;  Location: [facility];  Service: Urology;;   UPPER GI ENDOSCOPY  2013    Social History   Tobacco Use   Smoking status: Never   Smokeless tobacco: Never  Vaping Use   Vaping Use: Never used  Substance Use Topics   Alcohol use: No    Alcohol/week: 0.0 standard drinks   Drug use: No     Medication list has been reviewed and updated.  Current Meds  Medication Sig   acetaminophen (TYLENOL) 500 MG tablet Take 1,000 mg  by mouth daily as needed for moderate pain.   aspirin EC 81 MG tablet Take 81 mg by mouth daily.   atorvastatin (LIPITOR) 80 MG tablet Take 1 tablet (80 mg total) by mouth daily.   benazepril-hydrochlorthiazide (LOTENSIN HCT) 10-12.5 MG tablet Take 1 tablet by mouth daily.   Coenzyme Q10 (COQ-10) 100 MG CAPS Take 100 mg by mouth at bedtime.   dicyclomine (BENTYL) 10 MG capsule TAKE 1 CAPSULE BY MOUTH EVERY MORNING AND TAKE 1 CAPSULE AT BEDTIME   Dutasteride-Tamsulosin HCl 0.5-0.4 MG CAPS Take 1 capsule by mouth daily. (Patient taking differently: Take 1 capsule by mouth daily. sninsky)   EPINEPHrine 0.3 mg/0.3 mL IJ SOAJ injection Inject 0.3 mg into the muscle as needed for anaphylaxis.   etodolac (LODINE) 400 MG tablet Take 1 tablet (400 mg total) by mouth daily.   fluticasone (FLONASE) 50 MCG/ACT nasal spray Place 1 spray into both nostrils daily. otc   hydrOXYzine (ATARAX/VISTARIL) 10 MG tablet Take 1 tablet (10 mg total) by mouth at bedtime. For 2 week   loratadine (CLARITIN) 10 MG tablet Take 1 tablet (10 mg total) by mouth daily. For allergies   metoprolol succinate (TOPROL-XL)  25 MG 24 hr tablet TAKE 1 TABLET BY MOUTH DAILY   Multiple Vitamin (MULTIVITAMIN WITH MINERALS) TABS tablet Take 1 tablet by mouth at bedtime.   mupirocin ointment (BACTROBAN) 2 % Apply 1 application topically 2 (two) times daily. (Patient taking differently: Apply 1 application topically 2 (two) times daily as needed (irritation).)   naproxen sodium (ALEVE) 220 MG tablet Take 440 mg by mouth daily as needed (pain).   nitroGLYCERIN (NITROSTAT) 0.4 MG SL tablet Place 1 tablet (0.4 mg total) under the tongue every 5 (five) minutes as needed for chest pain.   pantoprazole (PROTONIX) 40 MG tablet TAKE 1 TABLET BY MOUTH DAILY   sertraline (ZOLOFT) 50 MG tablet Take 1 tablet (50 mg total) by mouth daily. Initiate one half q day for 2 weeks then 1 a day    PHQ 2/9 Scores 06/20/2021 05/22/2021 03/10/2021 01/29/2021  PHQ - 2  Score 0 0 2 0  PHQ- 9 Score 0 0 3 -    GAD 7 : Generalized Anxiety Score 06/20/2021 05/22/2021 03/10/2021 09/05/2020  Nervous, Anxious, on Edge 0 1 0 0  Control/stop worrying 0 1 0 0  Worry too much - different things 0 1 0 0  Trouble relaxing 0 0 0 0  Restless 0 0 0 0  Easily annoyed or irritable 0 0 1 0  Afraid - awful might happen 0 0 0 0  Total GAD 7 Score 0 3 1 0  Anxiety Difficulty - - Not difficult at all -    BP Readings from Last 3 Encounters:  06/20/21 138/80  05/22/21 (!) 146/80  03/19/21 (!) 177/89    Physical Exam Vitals and nursing note reviewed.  HENT:     Head: Normocephalic.     Right Ear: Tympanic membrane, ear canal and external ear normal.     Left Ear: Tympanic membrane, ear canal and external ear normal.     Nose: Nose normal.  Eyes:     General: No scleral icterus.       Right eye: No discharge.        Left eye: No discharge.     Conjunctiva/sclera: Conjunctivae normal.     Pupils: Pupils are equal, round, and reactive to light.  Neck:     Thyroid: No thyromegaly.     Vascular: No JVD.     Trachea: No tracheal deviation.  Cardiovascular:     Rate and Rhythm: Normal rate and regular rhythm.     Heart sounds: Normal heart sounds. No murmur heard.   No friction rub. No gallop.  Pulmonary:     Effort: No respiratory distress.     Breath sounds: Normal breath sounds. No wheezing, rhonchi or rales.  Abdominal:     General: Bowel sounds are normal.     Palpations: Abdomen is soft. There is no mass.     Tenderness: There is no abdominal tenderness. There is no guarding or rebound.  Musculoskeletal:     Cervical back: Normal range of motion and neck supple.     Left knee: Decreased range of motion. Tenderness present over the medial joint line and lateral joint line.     Comments: Extension block  Lymphadenopathy:     Cervical: No cervical adenopathy.  Skin:    General: Skin is warm.     Findings: No rash.  Neurological:     Mental Status: He is  alert and oriented to person, place, and time.     Cranial Nerves: No cranial nerve  deficit.     Deep Tendon Reflexes: Reflexes are normal and symmetric.    Wt Readings from Last 3 Encounters:  06/20/21 197 lb (89.4 kg)  05/22/21 196 lb (88.9 kg)  03/19/21 190 lb (86.2 kg)    BP 138/80   Pulse 64   Ht '5\' 7"'$  (1.702 m)   Wt 197 lb (89.4 kg)   BMI 30.85 kg/m   Assessment and Plan:  1. Primary osteoarthritis of left knee New onset.  Episodic.  Daily.  Patient has discomfort of his left knee with minimal swelling but it does have reduced range of motion specifically extension block.  Patient does take some etodolac but it on a as needed basis as well as Aleve and this has been discontinued.  I have told him I only want him to take 15 mg of meloxicam and may take Tylenol during the day if there is breakthrough pain.  We will recheck patient in 6 weeks as to the extent of the pain alleviation and if unresolved our next step would be sports medicine evaluation.

## 2021-06-27 ENCOUNTER — Other Ambulatory Visit: Payer: Self-pay | Admitting: Family Medicine

## 2021-06-27 DIAGNOSIS — I10 Essential (primary) hypertension: Secondary | ICD-10-CM

## 2021-07-17 ENCOUNTER — Other Ambulatory Visit: Payer: Self-pay | Admitting: Urology

## 2021-07-23 NOTE — Telephone Encounter (Signed)
complete

## 2021-08-15 ENCOUNTER — Ambulatory Visit: Payer: Self-pay | Admitting: *Deleted

## 2021-08-15 NOTE — Telephone Encounter (Signed)
Reason for Disposition  [1] HIGH RISK for severe COVID complications (e.g., weak immune system, age > 62 years, obesity with BMI > 25, pregnant, chronic lung disease or other chronic medical condition) AND [2] COVID symptoms (e.g., cough, fever)  (Exceptions: Already seen by PCP and no new or worsening symptoms.)  Answer Assessment - Initial Assessment Questions 1. COVID-19 DIAGNOSIS: "Who made your COVID-19 diagnosis?" "Was it confirmed by a positive lab test or self-test?" If not diagnosed by a doctor (or NP/PA), ask "Are there lots of cases (community spread) where you live?" Note: See public health department website, if unsure.     At home covid test positive  2. COVID-19 EXPOSURE: "Was there any known exposure to COVID before the symptoms began?" CDC Definition of close contact: within 6 feet (2 meters) for a total of 15 minutes or more over a 24-hour period.      Around friend  3. ONSET: "When did the COVID-19 symptoms start?"      Yesterday 08/14/21 4. WORST SYMPTOM: "What is your worst symptom?" (e.g., cough, fever, shortness of breath, muscle aches)     Cough , tickle in throat  5. COUGH: "Do you have a cough?" If Yes, ask: "How bad is the cough?"       Yes  6. FEVER: "Do you have a fever?" If Yes, ask: "What is your temperature, how was it measured, and when did it start?"     Low grade fever 99.3 7. RESPIRATORY STATUS: "Describe your breathing?" (e.g., shortness of breath, wheezing, unable to speak)      Ok  8. BETTER-SAME-WORSE: "Are you getting better, staying the same or getting worse compared to yesterday?"  If getting worse, ask, "In what way?"     Worse today  9. HIGH RISK DISEASE: "Do you have any chronic medical problems?" (e.g., asthma, heart or lung disease, weak immune system, obesity, etc.)     By pass surgery  10. VACCINE: "Have you had the COVID-19 vaccine?" If Yes, ask: "Which one, how many shots, when did you get it?"       no 11. BOOSTER: "Have you received your  COVID-19 booster?" If Yes, ask: "Which one and when did you get it?"       no 12. PREGNANCY: "Is there any chance you are pregnant?" "When was your last menstrual period?"       na 13. OTHER SYMPTOMS: "Do you have any other symptoms?"  (e.g., chills, fatigue, headache, loss of smell or taste, muscle pain, sore throat)       Fatigue , cough, sneezing runny nose, fever 14. O2 SATURATION MONITOR:  "Do you use an oxygen saturation monitor (pulse oximeter) at home?" If Yes, ask "What is your reading (oxygen level) today?" "What is your usual oxygen saturation reading?" (e.g., 95%)       na  Protocols used: Coronavirus (COVID-19) Diagnosed or Suspected-A-AH

## 2021-08-15 NOTE — Telephone Encounter (Signed)
Pt has tested positive for Covid, he is sneezing, runny nose, coughing, temp. 99.3   Called patient to review symptoms. Tested positive with at home covid test today 08/15/21. Symptoms started yesterday 08/14/21. C/o sneezing, cough, runny nose, temp 99.3 today , fatigue. Patient has not been vaccinated. Denies chest pain, difficulty breathing. Reviewed  quarantine precautions . Patient requesting medication if PCP feels it will help with symptoms. Care advise given. Patient verbalized understanding of care advise and to call back or go to [facility] or ED if symptoms worsen.

## 2021-08-18 ENCOUNTER — Other Ambulatory Visit: Payer: Self-pay

## 2021-08-18 ENCOUNTER — Telehealth: Payer: Self-pay

## 2021-08-18 ENCOUNTER — Encounter: Payer: Self-pay | Admitting: Family Medicine

## 2021-08-18 ENCOUNTER — Telehealth (INDEPENDENT_AMBULATORY_CARE_PROVIDER_SITE_OTHER): Payer: Medicare PPO | Admitting: Family Medicine

## 2021-08-18 VITALS — Temp 98.0°F

## 2021-08-18 DIAGNOSIS — U071 COVID-19: Secondary | ICD-10-CM

## 2021-08-18 MED ORDER — MOLNUPIRAVIR EUA 200MG CAPSULE: 4.0000 | ORAL_CAPSULE | Freq: Two times a day (BID) | ORAL | 0 refills | Status: AC

## 2021-08-18 NOTE — Progress Notes (Signed)
Date:  08/18/2021   Name:  Timothy Hatfield   DOB:  January 04, 1946   MRN:  [id number]   Chief Complaint: Covid Positive (Symptoms started on 22 and tested pos on 23- runny nose, tickle in throat, low grade fever 99.6)  I Army Fossa, MD from my office connected with this patient, Timothy Hatfield, by telephone at the patient's home.  I verified that I am speaking with the correct person using two identifiers. This visit was conducted via telephone due to the Covid-19 outbreak from my office at [facility] in [place], [place]. I discussed the limitations, risks, security and privacy concerns of performing an evaluation and management service by telephone. I also discussed with the patient that there may be a patient responsible charge related to this service. The patient expressed understanding and agreed to proceed. Patient over the weekend called with symptoms suggesting COVID and upon administering self COVID test was noted to be positive and is calling today for evaluation for antiviral therapy with a COVID risk of complications score of 5.  Cough This is a new problem. The current episode started in the past 7 days. The problem has been gradually improving. The cough is Non-productive. Associated symptoms include a fever, myalgias, nasal congestion, postnasal drip and a sore throat. Pertinent negatives include no chest pain, chills, ear congestion, ear pain, headaches, heartburn, hemoptysis, rash, rhinorrhea, shortness of breath, sweats, weight loss or wheezing. Nothing aggravates the symptoms. He has tried OTC cough suppressant and rest for the symptoms. The treatment provided mild relief.   Lab Results  Component Value Date   CREATININE 1.58 (H) 07/24/2020   BUN 25 (H) 07/24/2020   NA 139 07/24/2020   K 4.0 07/24/2020   CL 102 07/24/2020   CO2 26 07/24/2020   Lab Results  Component Value Date   CHOL 120 09/05/2020   HDL 48 09/05/2020   LDLCALC 54 09/05/2020   TRIG 94  09/05/2020   CHOLHDL 2.6 10/03/2018   No results found for: TSH No results found for: HGBA1C Lab Results  Component Value Date   WBC 7.0 07/24/2020   HGB 14.6 07/24/2020   HCT 42.7 07/24/2020   MCV 94.7 07/24/2020   PLT 261 07/24/2020   Lab Results  Component Value Date   ALT 24 03/27/2020   AST 25 03/27/2020   ALKPHOS 64 03/27/2020   BILITOT 0.9 03/27/2020     Review of Systems  Constitutional:  Positive for diaphoresis, fatigue and fever. Negative for chills and weight loss.  HENT:  Positive for postnasal drip and sore throat. Negative for ear pain and rhinorrhea.   Respiratory:  Positive for cough. Negative for hemoptysis, shortness of breath and wheezing.   Cardiovascular:  Negative for chest pain.  Gastrointestinal:  Positive for diarrhea. Negative for heartburn.  Musculoskeletal:  Positive for myalgias.  Skin:  Negative for rash.  Neurological:  Negative for headaches.   Patient Active Problem List   Diagnosis Date Noted   Benign prostatic hyperplasia with lower urinary tract symptoms 01/12/2020   GERD without esophagitis 03/25/2018   Moderate episode of recurrent major depressive disorder ([place]) 03/25/2018   Acute calcific periarthritis 06/18/2015   Allergic rhinitis 06/18/2015   Arthritis 06/18/2015   Essential (primary) hypertension 06/18/2015   Barrett esophagus 06/18/2015   HLD (hyperlipidemia) 06/18/2015   External hemorrhoid 06/18/2015   Gastroenteritis 06/18/2015   Bilateral nephrolithiasis 05/15/2014    Allergies  Allergen Reactions   Bee Venom Anaphylaxis   Ciprofloxacin Itching  Past Surgical History:  Procedure Laterality Date   CARDIAC CATHETERIZATION Left 09/09/2016   Procedure: Left Heart Cath and Coronary Angiography;  Surgeon: Yolonda Kida, MD;  Location: [place] [facility];  Service: Cardiovascular;  Laterality: Left;   COLONOSCOPY  2013   normal/ Dr Vira Agar- cleared for 10 yrs   COLONOSCOPY WITH PROPOFOL N/A 01/03/2018    Procedure: COLONOSCOPY WITH PROPOFOL;  Surgeon: Manya Silvas, MD;  Location: [facility];  Service: Endoscopy;  Laterality: N/A;   CORONARY ARTERY BYPASS GRAFT     CYSTOSCOPY W/ RETROGRADES Bilateral 07/11/2020   Procedure: CYSTOSCOPY WITH RETROGRADE PYELOGRAM;  Surgeon: Billey Co, MD;  Location: [facility];  Service: Urology;  Laterality: Bilateral;   CYSTOSCOPY W/ RETROGRADES  07/26/2020   Procedure: CYSTOSCOPY WITH RETROGRADE PYELOGRAM;  Surgeon: Billey Co, MD;  Location: [facility];  Service: Urology;;   CYSTOSCOPY W/ URETERAL STENT REMOVAL Right 07/26/2020   Procedure: CYSTOSCOPY WITH STENT REMOVAL;  Surgeon: Billey Co, MD;  Location: [facility];  Service: Urology;  Laterality: Right;   CYSTOSCOPY/URETEROSCOPY/HOLMIUM LASER/STENT PLACEMENT Bilateral 07/11/2020   Procedure: CYSTOSCOPY/URETEROSCOPY/HOLMIUM LASER/STENT PLACEMENT;  Surgeon: Billey Co, MD;  Location: [facility];  Service: Urology;  Laterality: Bilateral;   CYSTOSCOPY/URETEROSCOPY/HOLMIUM LASER/STENT PLACEMENT Left 07/26/2020   Procedure: CYSTOSCOPY/URETEROSCOPY/HOLMIUM LASER/STENT EXCHANGE;  Surgeon: Billey Co, MD;  Location: [facility];  Service: Urology;  Laterality: Left;   ESOPHAGOGASTRODUODENOSCOPY (EGD) WITH PROPOFOL N/A 01/03/2018   Procedure: ESOPHAGOGASTRODUODENOSCOPY (EGD) WITH PROPOFOL;  Surgeon: Manya Silvas, MD;  [facility];  Service: Endoscopy;  Laterality: N/A;   EXTRACORPOREAL SHOCK WAVE LITHOTRIPSY Left 07/11/2020   Procedure: EXTRACORPOREAL SHOCK WAVE LITHOTRIPSY (ESWL);  Surgeon: Billey Co, MD;  Location: [facility];  Service: Urology;  Laterality: Left;   EYE SURGERY     HERNIA REPAIR     IR NEPHROSTOMY PLACEMENT RIGHT  03/15/2018   kidney stones     lithotripsy   LITHOTRIPSY     NEPHROLITHOTOMY Right 03/15/2018   Procedure: NEPHROLITHOTOMY PERCUTANEOUS;  Surgeon: Abbie Sons, MD;  Location: [facility];  Service: Urology;  Laterality: Right;   STONE  EXTRACTION WITH BASKET  07/11/2020   Procedure: BLADDER STONE EXTRACTION;  Surgeon: Billey Co, MD;  Location: [facility];  Service: Urology;;   UPPER GI ENDOSCOPY  2013    Social History   Tobacco Use   Smoking status: Never   Smokeless tobacco: Never  Vaping Use   Vaping Use: Never used  Substance Use Topics   Alcohol use: No    Alcohol/week: 0.0 standard drinks   Drug use: No     Medication list has been reviewed and updated.  Current Meds  Medication Sig   acetaminophen (TYLENOL) 500 MG tablet Take 1,000 mg by mouth daily as needed for moderate pain.   aspirin EC 81 MG tablet Take 81 mg by mouth daily.   atorvastatin (LIPITOR) 80 MG tablet Take 1 tablet (80 mg total) by mouth daily.   benazepril-hydrochlorthiazide (LOTENSIN HCT) 10-12.5 MG tablet TAKE 1 TABLET BY MOUTH DAILY   Coenzyme Q10 (COQ-10) 100 MG CAPS Take 100 mg by mouth at bedtime.   Dutasteride-Tamsulosin HCl 0.5-0.4 MG CAPS TAKE 1 CAPSULE BY MOUTH ONCE DAILY   EPINEPHrine 0.3 mg/0.3 mL IJ SOAJ injection Inject 0.3 mg into the muscle as needed for anaphylaxis.   fluticasone (FLONASE) 50 MCG/ACT nasal spray Place 1 spray into both nostrils daily. otc   loratadine (CLARITIN) 10 MG tablet Take 1 tablet (10 mg  total) by mouth daily. For allergies   meloxicam (MOBIC) 15 MG tablet Take 1 tablet (15 mg total) by mouth daily.   metoprolol succinate (TOPROL-XL) 25 MG 24 hr tablet TAKE 1 TABLET BY MOUTH DAILY   Multiple Vitamin (MULTIVITAMIN WITH MINERALS) TABS tablet Take 1 tablet by mouth at bedtime.   nitroGLYCERIN (NITROSTAT) 0.4 MG SL tablet Place 1 tablet (0.4 mg total) under the tongue every 5 (five) minutes as needed for chest pain.   pantoprazole (PROTONIX) 40 MG tablet TAKE 1 TABLET BY MOUTH DAILY   sertraline (ZOLOFT) 50 MG tablet Take 1 tablet (50 mg total) by mouth daily. Initiate one half q day for 2 weeks then 1 a day   [DISCONTINUED] etodolac (LODINE) 400 MG tablet Take 1 tablet (400 mg total) by mouth  daily.    PHQ 2/9 Scores 08/18/2021 06/20/2021 05/22/2021 03/10/2021  PHQ - 2 Score 0 0 0 2  PHQ- 9 Score 0 0 0 3    GAD 7 : Generalized Anxiety Score 08/18/2021 06/20/2021 05/22/2021 03/10/2021  Nervous, Anxious, on Edge 0 0 1 0  Control/stop worrying 0 0 1 0  Worry too much - different things 0 0 1 0  Trouble relaxing 0 0 0 0  Restless 0 0 0 0  Easily annoyed or irritable 0 0 0 1  Afraid - awful might happen 0 0 0 0  Total GAD 7 Score 0 0 3 1  Anxiety Difficulty - - - Not difficult at all    BP Readings from Last 3 Encounters:  06/20/21 138/80  05/22/21 (!) 146/80  03/19/21 (!) 177/89    Physical Exam Vitals and nursing note reviewed.  Neurological:     Mental Status: He is alert.    Wt Readings from Last 3 Encounters:  06/20/21 197 lb (89.4 kg)  05/22/21 196 lb (88.9 kg)  03/19/21 190 lb (86.2 kg)    Temp 98 F (36.7 C) (Oral)   Assessment and Plan:  1. COVID New onset.  Relatively stable.  But still somewhat symptomatic.  Symptoms began on Thursday with COVID testing that was positive on Friday.  Over the weekend has been treating self with fluids and Tylenol.  On evaluation with rescore 5 patient has been placed onmolnupiravir.  Patient has been discussed about upcoming symptoms and worsening of signs and symptoms that may indicate going to the hospital.  Patient has been discussed about isolation for at least 5 to 7 days and to wear a mask for another 5 days thereafter if in public.  - molnupiravir EUA (LAGEVRIO) 200 mg CAPS capsule; Take 4 capsules (800 mg total) by mouth 2 (two) times daily for 5 days.  Dispense: 40 capsule; Refill: 0   I spent 10 minutes with this patient, More than 50% of that time was spent in face to face education, counseling and care coordination.

## 2021-08-18 NOTE — Patient Instructions (Signed)

## 2021-08-18 NOTE — Telephone Encounter (Signed)
Copied from [place] [id number]. Topic: General - Other >> Aug 18, 2021  9:50 AM Ivar Drape wrote: Reason for CRM: Patient said the nurse Tera had called him several times and he was returning the call.  He was told that the nurse was busy but would return the call.  He said he would be by the phone waiting.  He was also told he had a phone visit today, 9/26 at 1:40pm

## 2021-09-01 DIAGNOSIS — L821 Other seborrheic keratosis: Secondary | ICD-10-CM | POA: Diagnosis not present

## 2021-09-01 DIAGNOSIS — L570 Actinic keratosis: Secondary | ICD-10-CM | POA: Diagnosis not present

## 2021-09-09 ENCOUNTER — Ambulatory Visit: Payer: Medicare PPO | Admitting: Family Medicine

## 2021-09-09 ENCOUNTER — Encounter: Payer: Self-pay | Admitting: Family Medicine

## 2021-09-09 ENCOUNTER — Other Ambulatory Visit: Payer: Self-pay

## 2021-09-09 VITALS — BP 130/70 | HR 80 | Ht 67.0 in | Wt 201.0 lb

## 2021-09-09 DIAGNOSIS — Z23 Encounter for immunization: Secondary | ICD-10-CM | POA: Diagnosis not present

## 2021-09-09 DIAGNOSIS — F329 Major depressive disorder, single episode, unspecified: Secondary | ICD-10-CM | POA: Diagnosis not present

## 2021-09-09 DIAGNOSIS — I10 Essential (primary) hypertension: Secondary | ICD-10-CM

## 2021-09-09 DIAGNOSIS — K2270 Barrett's esophagus without dysplasia: Secondary | ICD-10-CM

## 2021-09-09 DIAGNOSIS — E782 Mixed hyperlipidemia: Secondary | ICD-10-CM

## 2021-09-09 MED ORDER — METOPROLOL SUCCINATE ER 25 MG PO TB24: 25.0000 mg | ORAL_TABLET | Freq: Every day | ORAL | 1 refills | Status: DC

## 2021-09-09 MED ORDER — SERTRALINE HCL 50 MG PO TABS: 50.0000 mg | ORAL_TABLET | Freq: Every day | ORAL | 1 refills | Status: DC

## 2021-09-09 MED ORDER — ATORVASTATIN CALCIUM 80 MG PO TABS: 80.0000 mg | ORAL_TABLET | Freq: Every day | ORAL | 1 refills | Status: DC

## 2021-09-09 MED ORDER — BENAZEPRIL-HYDROCHLOROTHIAZIDE 10-12.5 MG PO TABS: 1.0000 | ORAL_TABLET | Freq: Every day | ORAL | 1 refills | Status: DC

## 2021-09-09 MED ORDER — PANTOPRAZOLE SODIUM 40 MG PO TBEC: 40.0000 mg | DELAYED_RELEASE_TABLET | Freq: Every day | ORAL | 1 refills | Status: DC

## 2021-09-09 NOTE — Progress Notes (Signed)
Date:  09/09/2021   Name:  Timothy Hatfield   DOB:  24-Jan-1946   MRN:  [id number]   Chief Complaint: Hypertension, Hyperlipidemia, Gastroesophageal Reflux, Depression, and Flu Vaccine  Hypertension This is a chronic problem. The current episode started more than 1 year ago. The problem has been gradually improving since onset. The problem is controlled. Pertinent negatives include no anxiety, blurred vision, chest pain, headaches, malaise/fatigue, neck pain, orthopnea, palpitations, peripheral edema, PND, shortness of breath or sweats. Risk factors for coronary artery disease include dyslipidemia. Past treatments include ACE inhibitors and diuretics. The current treatment provides moderate improvement. There are no compliance problems.  There is no history of angina, kidney disease, CAD/MI, CVA, heart failure, left ventricular hypertrophy, PVD or retinopathy. There is no history of chronic renal disease, a hypertension causing med or renovascular disease.  Hyperlipidemia This is a chronic problem. The current episode started more than 1 year ago. The problem is controlled. Recent lipid tests were reviewed and are normal. He has no history of chronic renal disease or diabetes. Pertinent negatives include no chest pain, myalgias or shortness of breath. Current antihyperlipidemic treatment includes statins. The current treatment provides mild improvement of lipids. There are no compliance problems.   Gastroesophageal Reflux He reports no abdominal pain, no chest pain, no coughing, no dysphagia, no heartburn, no hoarse voice, no nausea, no sore throat or no wheezing. This is a new problem. The current episode started more than 1 year ago. Pertinent negatives include no fatigue. He has tried a PPI for the symptoms. The treatment provided moderate relief.  Depression        This is a chronic problem.  The onset quality is gradual.   Associated symptoms include no fatigue, no helplessness, no  hopelessness, no restlessness, no decreased interest, no myalgias, no headaches, not sad and no suicidal ideas.  Past treatments include SSRIs - Selective serotonin reuptake inhibitors.   Pertinent negatives include no anxiety.  Lab Results  Component Value Date   CREATININE 1.58 (H) 07/24/2020   BUN 25 (H) 07/24/2020   NA 139 07/24/2020   K 4.0 07/24/2020   CL 102 07/24/2020   CO2 26 07/24/2020   Lab Results  Component Value Date   CHOL 120 09/05/2020   HDL 48 09/05/2020   LDLCALC 54 09/05/2020   TRIG 94 09/05/2020   CHOLHDL 2.6 10/03/2018   No results found for: TSH No results found for: HGBA1C Lab Results  Component Value Date   WBC 7.0 07/24/2020   HGB 14.6 07/24/2020   HCT 42.7 07/24/2020   MCV 94.7 07/24/2020   PLT 261 07/24/2020   Lab Results  Component Value Date   ALT 24 03/27/2020   AST 25 03/27/2020   ALKPHOS 64 03/27/2020   BILITOT 0.9 03/27/2020     Review of Systems  Constitutional:  Negative for chills, fatigue, fever and malaise/fatigue.  HENT:  Negative for drooling, ear discharge, ear pain, hoarse voice and sore throat.   Eyes:  Negative for blurred vision.  Respiratory:  Negative for cough, shortness of breath and wheezing.   Cardiovascular:  Negative for chest pain, palpitations, orthopnea, leg swelling and PND.  Gastrointestinal:  Negative for abdominal pain, blood in stool, constipation, diarrhea, dysphagia, heartburn and nausea.  Endocrine: Negative for polydipsia.  Genitourinary:  Negative for dysuria, frequency, hematuria and urgency.  Musculoskeletal:  Negative for back pain, myalgias and neck pain.  Skin:  Negative for rash.  Allergic/Immunologic: Negative for environmental allergies.  Neurological:  Negative for dizziness and headaches.  Hematological:  Does not bruise/bleed easily.  Psychiatric/Behavioral:  Positive for depression. Negative for suicidal ideas. The patient is not nervous/anxious.    Patient Active Problem List    Diagnosis Date Noted   Benign prostatic hyperplasia with lower urinary tract symptoms 01/12/2020   GERD without esophagitis 03/25/2018   Moderate episode of recurrent major depressive disorder ([place]) 03/25/2018   Acute calcific periarthritis 06/18/2015   Allergic rhinitis 06/18/2015   Arthritis 06/18/2015   Essential (primary) hypertension 06/18/2015   Barrett esophagus 06/18/2015   HLD (hyperlipidemia) 06/18/2015   External hemorrhoid 06/18/2015   Gastroenteritis 06/18/2015   Bilateral nephrolithiasis 05/15/2014    Allergies  Allergen Reactions   Bee Venom Anaphylaxis   Ciprofloxacin Itching    Past Surgical History:  Procedure Laterality Date   CARDIAC CATHETERIZATION Left 09/09/2016   Procedure: Left Heart Cath and Coronary Angiography;  Surgeon: Yolonda Kida, MD;  Location: [facility];  Service: Cardiovascular;  Laterality: Left;   COLONOSCOPY  2013   normal/ Dr Vira Agar- cleared for 10 yrs   COLONOSCOPY WITH PROPOFOL N/A 01/03/2018   Procedure: COLONOSCOPY WITH PROPOFOL;  Surgeon: Manya Silvas, MD;  Location: [facility];  Service: Endoscopy;  Laterality: N/A;   CORONARY ARTERY BYPASS GRAFT     CYSTOSCOPY W/ RETROGRADES Bilateral 07/11/2020   Procedure: CYSTOSCOPY WITH RETROGRADE PYELOGRAM;  Surgeon: Billey Co, MD;  Location: [facility];  Service: Urology;  Laterality: Bilateral;   CYSTOSCOPY W/ RETROGRADES  07/26/2020   Procedure: CYSTOSCOPY WITH RETROGRADE PYELOGRAM;  Surgeon: Billey Co, MD;  Location: [facility];  Service: Urology;;   CYSTOSCOPY W/ URETERAL STENT REMOVAL Right 07/26/2020   Procedure: CYSTOSCOPY WITH STENT REMOVAL;  Surgeon: Billey Co, MD;  Location: [facility];  Service: Urology;  Laterality: Right;   CYSTOSCOPY/URETEROSCOPY/HOLMIUM LASER/STENT PLACEMENT Bilateral 07/11/2020   Procedure: CYSTOSCOPY/URETEROSCOPY/HOLMIUM LASER/STENT PLACEMENT;  Surgeon: Billey Co, MD;  Location: [facility];  Service: Urology;  Laterality:  Bilateral;   CYSTOSCOPY/URETEROSCOPY/HOLMIUM LASER/STENT PLACEMENT Left 07/26/2020   Procedure: CYSTOSCOPY/URETEROSCOPY/HOLMIUM LASER/STENT EXCHANGE;  Surgeon: Billey Co, MD;  Location: [facility];  Service: Urology;  Laterality: Left;   ESOPHAGOGASTRODUODENOSCOPY (EGD) WITH PROPOFOL N/A 01/03/2018   Procedure: ESOPHAGOGASTRODUODENOSCOPY (EGD) WITH PROPOFOL;  Surgeon: Manya Silvas, MD;  Location: [facility] END[facility]COPY;  Service: Endoscopy;  Laterality: N/A;   EXTRACORPOREAL SHOCK WAVE LITHOTRIPSY Left 07/11/2020   Procedure: EXTRACORPOREAL SHOCK WAVE LITHOTRIPSY (ESWL);  Surgeon: Billey Co, MD;  Location: [facility];  Service: Urology;  Laterality: Left;   EYE SURGERY     HERNIA REPAIR     IR NEPHROSTOMY PLACEMENT RIGHT  03/15/2018   kidney stones     lithotripsy   LITHOTRIPSY     NEPHROLITHOTOMY Right 03/15/2018   Procedure: NEPHROLITHOTOMY PERCUTANEOUS;  Surgeon: Abbie Sons, MD;  Location: [facility];  Service: Urology;  Laterality: Right;   STONE EXTRACTION WITH BASKET  07/11/2020   Procedure: BLADDER STONE EXTRACTION;  Surgeon: Billey Co, MD;  Location: [facility];  Service: Urology;;   UPPER GI ENDOSCOPY  2013    Social History   Tobacco Use   Smoking status: Never   Smokeless tobacco: Never  Vaping Use   Vaping Use: Never used  Substance Use Topics   Alcohol use: No    Alcohol/week: 0.0 standard drinks   Drug use: No     Medication list has been reviewed and updated.  Current Meds  Medication Sig   acetaminophen (TYLENOL)  500 MG tablet Take 1,000 mg by mouth daily as needed for moderate pain.   aspirin EC 81 MG tablet Take 81 mg by mouth daily.   atorvastatin (LIPITOR) 80 MG tablet Take 1 tablet (80 mg total) by mouth daily.   benazepril-hydrochlorthiazide (LOTENSIN HCT) 10-12.5 MG tablet TAKE 1 TABLET BY MOUTH DAILY   Coenzyme Q10 (COQ-10) 100 MG CAPS Take 100 mg by mouth at bedtime.   Dutasteride-Tamsulosin HCl 0.5-0.4 MG CAPS TAKE 1 CAPSULE BY MOUTH ONCE  DAILY   EPINEPHrine 0.3 mg/0.3 mL IJ SOAJ injection Inject 0.3 mg into the muscle as needed for anaphylaxis.   fluticasone (FLONASE) 50 MCG/ACT nasal spray Place 1 spray into both nostrils daily. otc   loratadine (CLARITIN) 10 MG tablet Take 1 tablet (10 mg total) by mouth daily. For allergies   meloxicam (MOBIC) 15 MG tablet Take 1 tablet (15 mg total) by mouth daily.   metoprolol succinate (TOPROL-XL) 25 MG 24 hr tablet TAKE 1 TABLET BY MOUTH DAILY   Multiple Vitamin (MULTIVITAMIN WITH MINERALS) TABS tablet Take 1 tablet by mouth at bedtime.   nitroGLYCERIN (NITROSTAT) 0.4 MG SL tablet Place 1 tablet (0.4 mg total) under the tongue every 5 (five) minutes as needed for chest pain.   pantoprazole (PROTONIX) 40 MG tablet TAKE 1 TABLET BY MOUTH DAILY   sertraline (ZOLOFT) 50 MG tablet Take 1 tablet (50 mg total) by mouth daily. Initiate one half q day for 2 weeks then 1 a day (Patient taking differently: Take 50 mg by mouth daily. One tablet daily)    PHQ 2/9 Scores 09/09/2021 08/18/2021 06/20/2021 05/22/2021  PHQ - 2 Score 0 0 0 0  PHQ- 9 Score 0 0 0 0    GAD 7 : Generalized Anxiety Score 09/09/2021 08/18/2021 06/20/2021 05/22/2021  Nervous, Anxious, on Edge 0 0 0 1  Control/stop worrying 0 0 0 1  Worry too much - different things 0 0 0 1  Trouble relaxing 0 0 0 0  Restless 0 0 0 0  Easily annoyed or irritable 0 0 0 0  Afraid - awful might happen 0 0 0 0  Total GAD 7 Score 0 0 0 3  Anxiety Difficulty - - - -    BP Readings from Last 3 Encounters:  09/09/21 130/70  06/20/21 138/80  05/22/21 (!) 146/80    Physical Exam Vitals and nursing note reviewed.  HENT:     Right Ear: Tympanic membrane and ear canal normal.     Left Ear: Tympanic membrane normal.     Nose: No congestion or rhinorrhea.  Cardiovascular:     Chest Wall: PMI is not displaced.     Pulses: Normal pulses.     Heart sounds: S1 normal and S2 normal. No murmur heard. No systolic murmur is present.  No diastolic  murmur is present.    No friction rub. No gallop. No S3 or S4 sounds.  Pulmonary:     Effort: No respiratory distress.     Breath sounds: No stridor. No wheezing or rhonchi.  Abdominal:     Tenderness: There is no abdominal tenderness. There is no guarding.  Musculoskeletal:     Right lower leg: No edema.     Left lower leg: No edema.    Wt Readings from Last 3 Encounters:  09/09/21 201 lb (91.2 kg)  06/20/21 197 lb (89.4 kg)  05/22/21 196 lb (88.9 kg)    BP 130/70   Pulse 80   Ht 5\' 7"  (  1.702 m)   Wt 201 lb (91.2 kg)   BMI 31.48 kg/m   Assessment and Plan:  1. Mixed hyperlipidemia Chronic.  Controlled.  Stable.  Continue atorvastatin 80 mg once a day. - atorvastatin (LIPITOR) 80 MG tablet; Take 1 tablet (80 mg total) by mouth daily.  Dispense: 90 tablet; Refill: 1  2. Essential (primary) hypertension Chronic.  Controlled.  Stable.  Blood pressure today is 130/70.  Continue metoprolol XL 25 mg and benazepril hydrochlorothiazide in 10-12.5 mg once a day.  We will recheck renal function panel in 6 months - benazepril-hydrochlorthiazide (LOTENSIN HCT) 10-12.5 MG tablet; Take 1 tablet by mouth daily.  Dispense: 90 tablet; Refill: 1 - metoprolol succinate (TOPROL-XL) 25 MG 24 hr tablet; Take 1 tablet (25 mg total) by mouth daily.  Dispense: 90 tablet; Refill: 1  3. Barrett's esophagus without dysplasia Chronic.  Controlled.  Stable.  Presently controlled on pantoprazole 40 mg once a day. - pantoprazole (PROTONIX) 40 MG tablet; Take 1 tablet (40 mg total) by mouth daily.  Dispense: 90 tablet; Refill: 1  4. Reactive depression Chronic.  Controlled.  Stable.  Gad score is 0.  Continue sertraline 50 mg once a day. - sertraline (ZOLOFT) 50 MG tablet; Take 1 tablet (50 mg total) by mouth daily. One tablet daily  Dispense: 90 tablet; Refill: 1  5. Need for immunization against influenza Discussed and administered - Flu Vaccine QUAD High Dose(Fluad)

## 2021-09-11 ENCOUNTER — Other Ambulatory Visit: Payer: Self-pay | Admitting: Family Medicine

## 2021-09-11 NOTE — Telephone Encounter (Signed)
Requested medications are due for refill today yes  Requested medications are on the active medication list yes  Last refill 08/19/21  Last visit 06/20/21  Future visit scheduled 3/13. And 4/18   Notes to clinic Pt was started on Mobic and to return in 6 weeks, next appt not until Mar and April, also failed protocol of hgb and creatinine in 360 days, last lab was 07/24/20, please assess.  Requested Prescriptions  Pending Prescriptions Disp Refills   meloxicam (MOBIC) 15 MG tablet [Pharmacy Med Name: MELOXICAM 15 MG TAB] 30 tablet 2    Sig: TAKE 1 TABLET BY MOUTH DAILY     Analgesics:  COX2 Inhibitors Failed - 09/11/2021 10:29 AM      Failed - HGB in normal range and within 360 days    Hemoglobin  Date Value Ref Range Status  07/24/2020 14.6 13.0 - 17.0 g/dL Final  08/07/2019 14.6 13.0 - 17.7 g/dL Final          Failed - Cr in normal range and within 360 days    Creatinine, Ser  Date Value Ref Range Status  07/24/2020 1.58 (H) 0.61 - 1.24 mg/dL Final          Passed - Patient is not pregnant      Passed - Valid encounter within last 12 months    Recent Outpatient Visits           2 days ago Essential (primary) hypertension   [facility] Juline Patch, MD   3 weeks ago [facility] Juline Patch, MD   2 months ago Primary osteoarthritis of left knee   [facility] Juline Patch, MD   3 months ago Cellulitis of left upper extremity   [facility] Juline Patch, MD   6 months ago Essential (primary) hypertension   [place], [place]       Future Appointments             In 6 months Juline Patch, MD [facility], [facility]

## 2021-10-06 DIAGNOSIS — H53002 Unspecified amblyopia, left eye: Secondary | ICD-10-CM | POA: Diagnosis not present

## 2021-10-06 DIAGNOSIS — Z0100 Encounter for examination of eyes and vision without abnormal findings: Secondary | ICD-10-CM | POA: Diagnosis not present

## 2021-11-10 DIAGNOSIS — T466X5A Adverse effect of antihyperlipidemic and antiarteriosclerotic drugs, initial encounter: Secondary | ICD-10-CM | POA: Diagnosis not present

## 2021-11-10 DIAGNOSIS — G720 Drug-induced myopathy: Secondary | ICD-10-CM | POA: Diagnosis not present

## 2021-11-10 DIAGNOSIS — E782 Mixed hyperlipidemia: Secondary | ICD-10-CM | POA: Diagnosis not present

## 2021-11-10 DIAGNOSIS — I10 Essential (primary) hypertension: Secondary | ICD-10-CM | POA: Diagnosis not present

## 2021-11-10 DIAGNOSIS — I2510 Atherosclerotic heart disease of native coronary artery without angina pectoris: Secondary | ICD-10-CM | POA: Diagnosis not present

## 2021-11-10 DIAGNOSIS — G4733 Obstructive sleep apnea (adult) (pediatric): Secondary | ICD-10-CM | POA: Diagnosis not present

## 2021-11-10 DIAGNOSIS — Z951 Presence of aortocoronary bypass graft: Secondary | ICD-10-CM | POA: Diagnosis not present

## 2021-11-12 IMAGING — CT CT RENAL STONE PROTOCOL
2 of 4 series · 15 of 46 positions shown, 17 images · non-contrast
Comparison: 05/28/2020, ultrasound from earlier in the same day, CT
from 05/24/2019.

CLINICAL DATA: Left-sided flank pain for several weeks

EXAM:
CT ABDOMEN AND PELVIS WITHOUT CONTRAST
TECHNIQUE: Multidetector CT imaging of the abdomen and pelvis was performed
following the standard protocol without IV contrast.

[Series 2: stone full standard · axial · 0.69mm/px · z∈[-488,+12]mm · 12 of 110 slices shown, 14 images]
[im 5/110  soft-tissue]
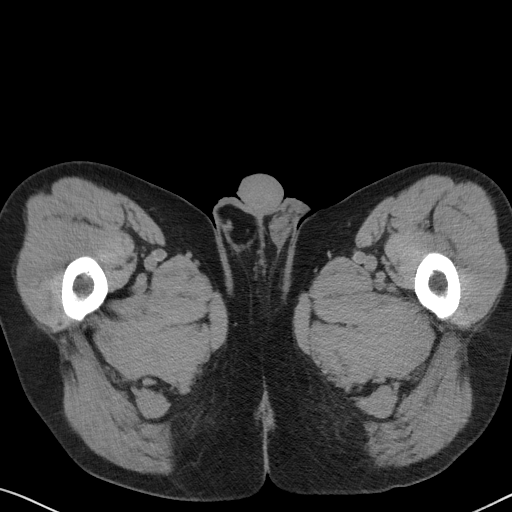
[im 5/110  bone]
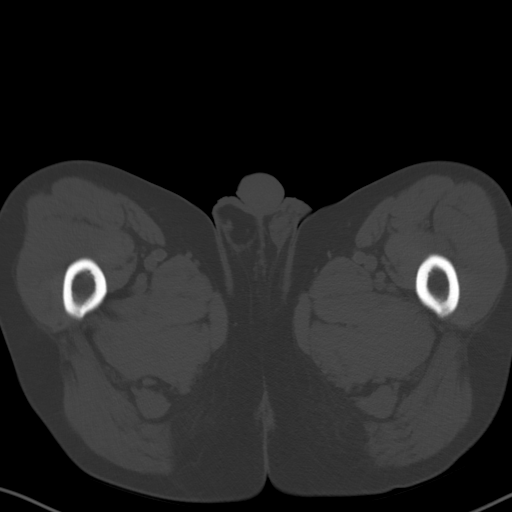
[im 15/110  soft-tissue]
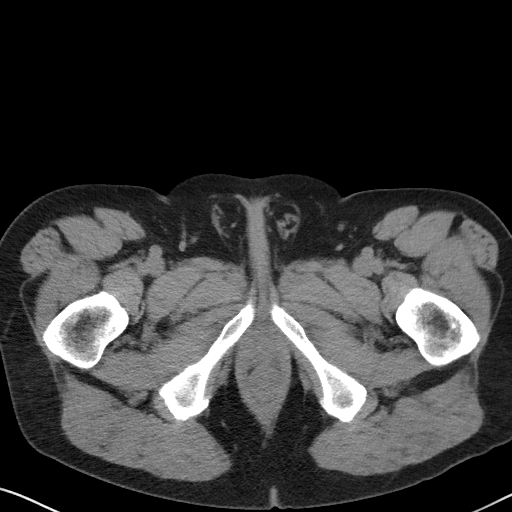
[im 24/110  soft-tissue]
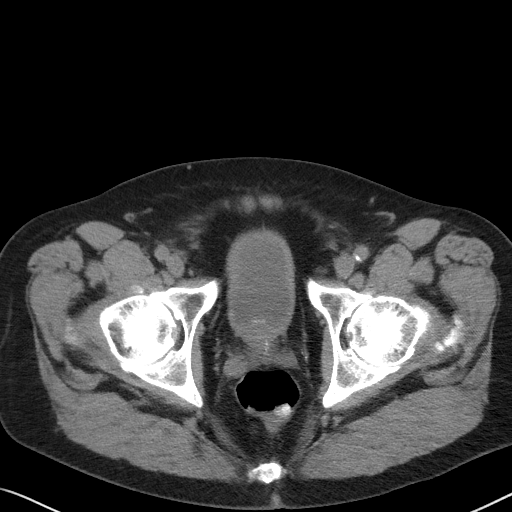
[im 34/110  soft-tissue]
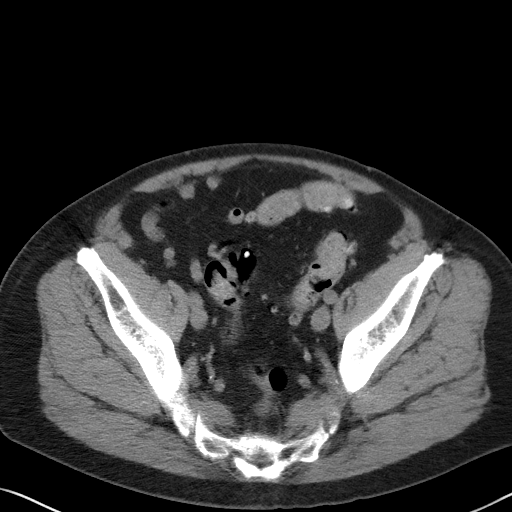
[im 43/110  soft-tissue]
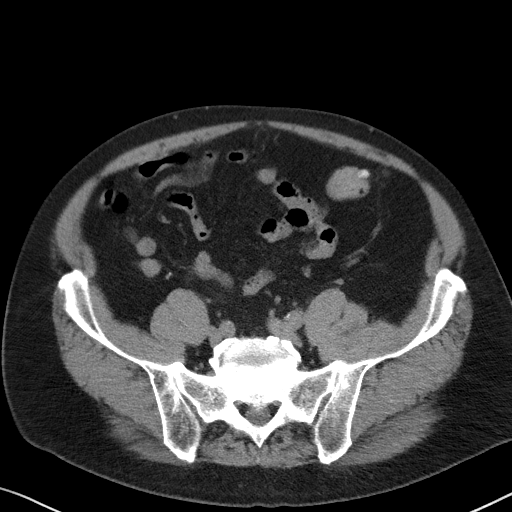
[im 53/110  soft-tissue]
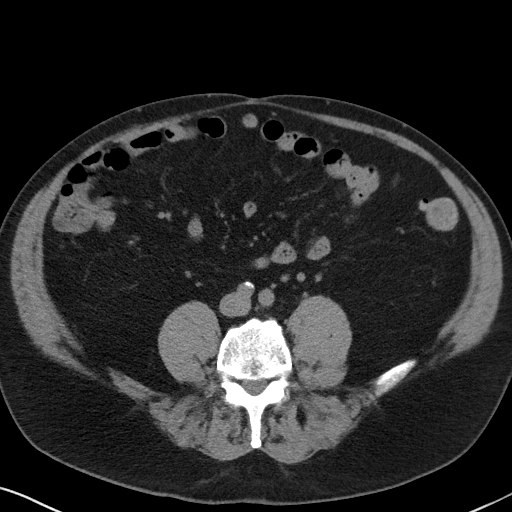
[im 57/110  soft-tissue]
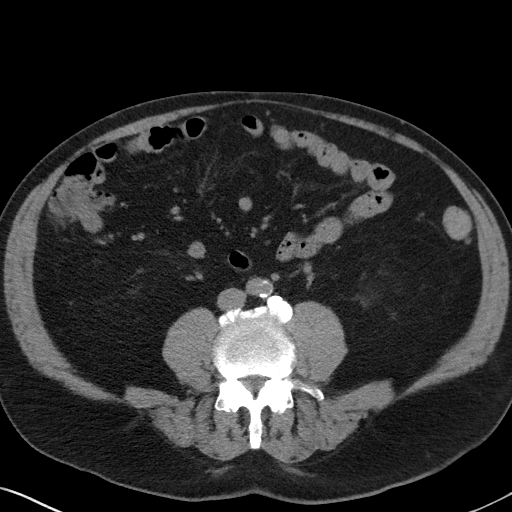
[im 67/110  soft-tissue]
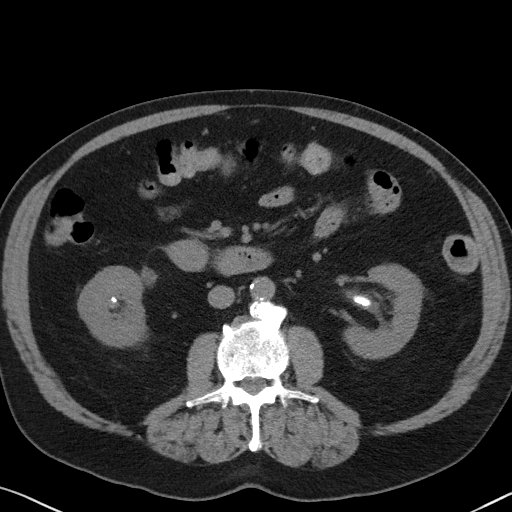
[im 76/110  soft-tissue]
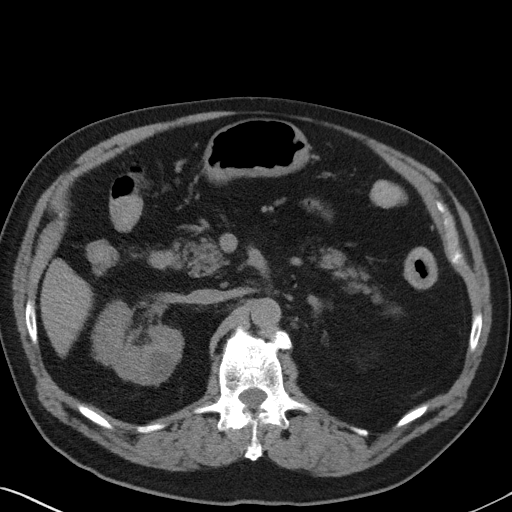
[im 76/110  bone]
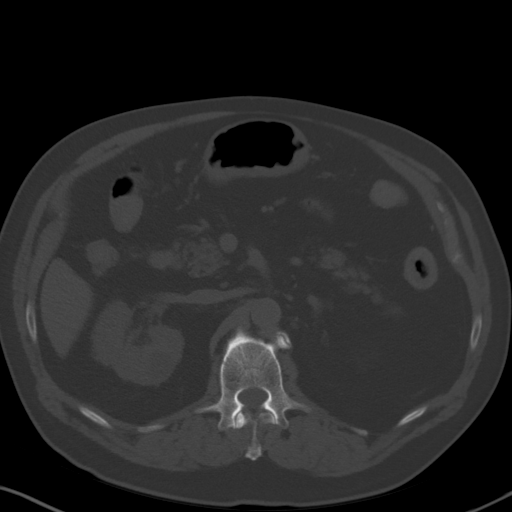
[im 86/110  soft-tissue]
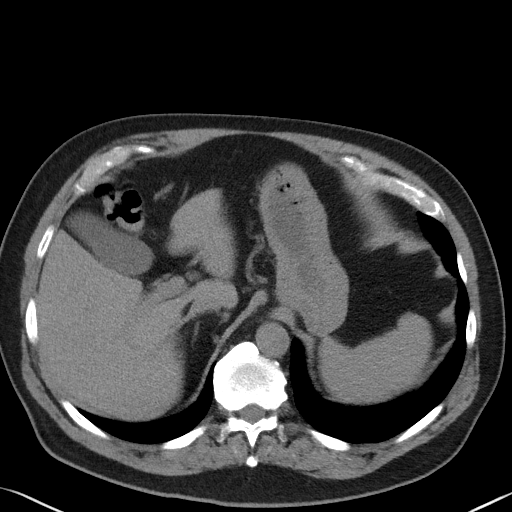
[im 95/110  soft-tissue]
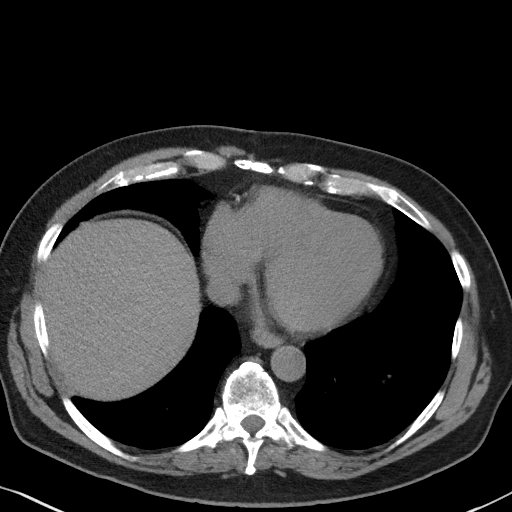
[im 105/110  soft-tissue]
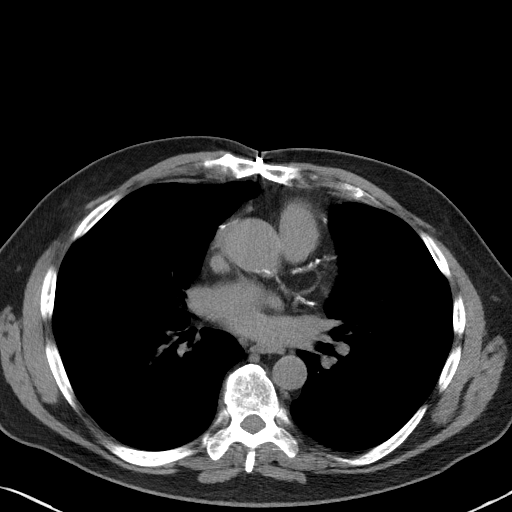

[Series 5: coronal · coronal · 0.74mm/px · 3 of 144 slices shown]
[im 48/144  soft-tissue]
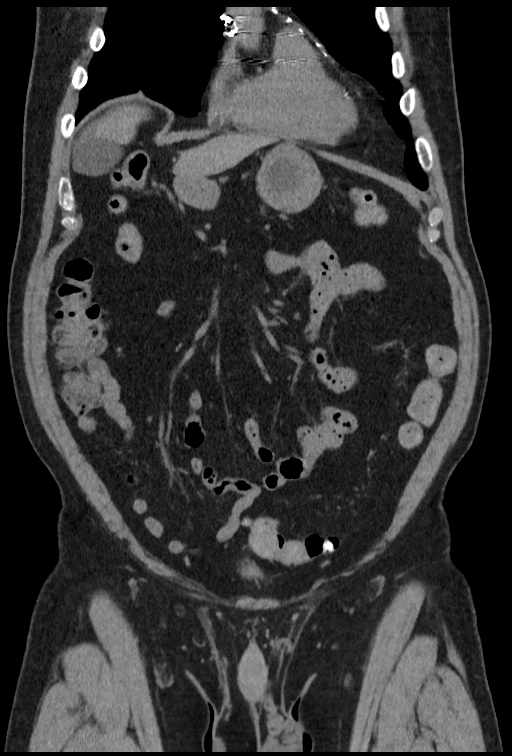
[im 64/144  soft-tissue]
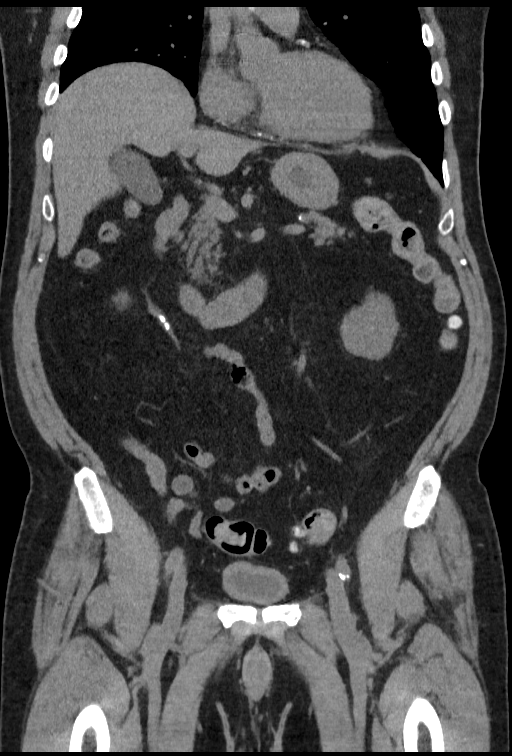
[im 80/144  soft-tissue]
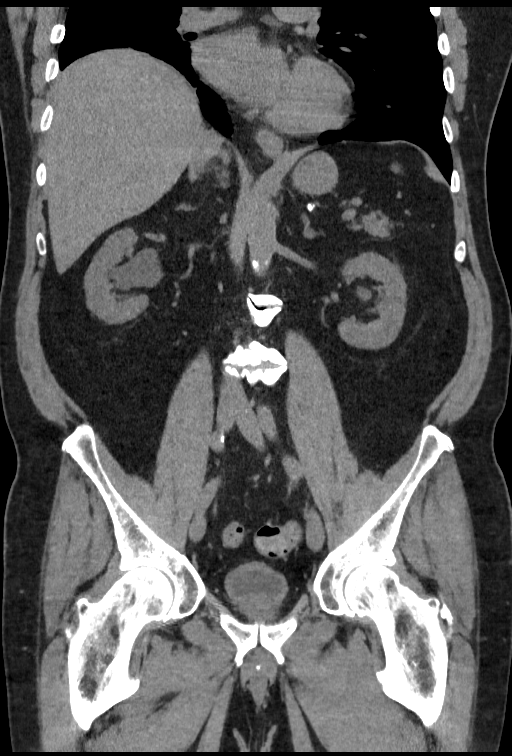

[15 of 46 positions shown; findings below may reference images not displayed]

FINDINGS: Lower chest: No acute abnormality.

Hepatobiliary: Dependent density is noted within the gallbladder
consistent with small stones. The liver is within normal limits.

Pancreas: Unremarkable. No pancreatic ductal dilatation or
surrounding inflammatory changes.

Spleen: Normal in size without focal abnormality.

Adrenals/Urinary Tract: Adrenal glands are within normal limits. The
kidneys are well visualized bilaterally with renal cystic change
similar to that prior CT as well as on recent ultrasound. Multiple
nonobstructing right renal stones are seen. Additionally there are 2
proximal right ureteral stones. These cause mild hydronephrosis. The
largest of these measures 8 mm in greatest dimension. The more
distal right ureter is within normal limits. Left kidney
demonstrates renal pelvic stone is well as nonobstructing renal
calculi. The renal pelvic stone measures 17 mm in greatest
dimension. The left ureter is otherwise within normal limits. The
bladder is well distended with multiple bladder calculi consistent
with prior passage of stones.

Stomach/Bowel: Diverticular change of the colon is noted without
evidence of diverticulitis. No obstructive changes are seen. The
appendix is within normal limits. No small bowel or gastric
abnormality is noted.

Vascular/Lymphatic: Aortic atherosclerosis. No enlarged abdominal or
pelvic lymph nodes.

Reproductive: Prostate is unremarkable.

Other: No abdominal wall hernia or abnormality. No abdominopelvic
ascites.

Musculoskeletal: Degenerative changes of the hip joints and lumbar
spine are noted.
IMPRESSION: Bilateral nonobstructing renal calculi as described.

Two calculi within the proximal right ureter causing right-sided
hydronephrosis.

Bilateral renal cysts.

Diverticulosis without diverticulitis.

Multiple bladder calculi

## 2021-11-12 IMAGING — US US RENAL
1 series · 14 of 25 positions shown · non-contrast
Comparison: CT abdomen and pelvis 06/21/2020

CLINICAL DATA: Nephrolithiasis, flank pain side not specified

EXAM:
RENAL / URINARY TRACT ULTRASOUND COMPLETE

[Series 1: us renal · 14 of 54 slices shown]
[im 1/54]
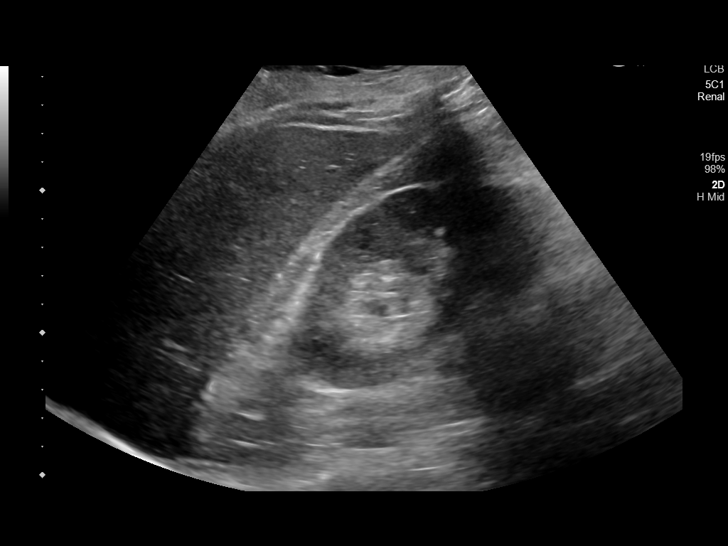
[im 5/54]
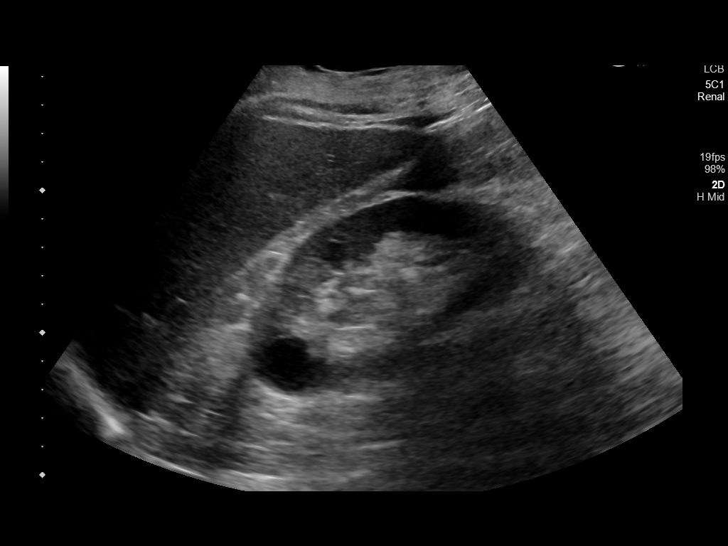
[im 9/54]
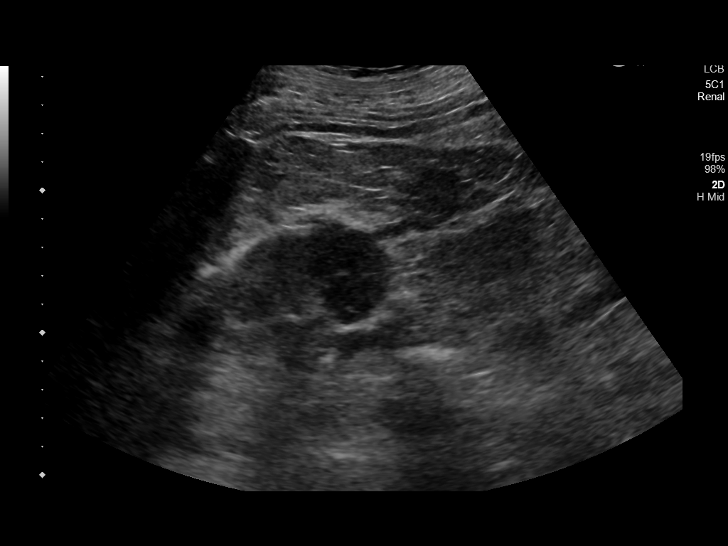
[im 14/54]
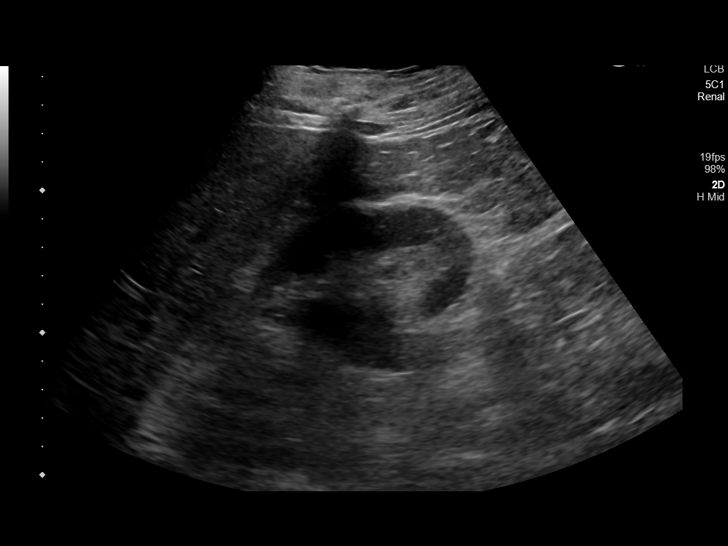
[im 18/54]
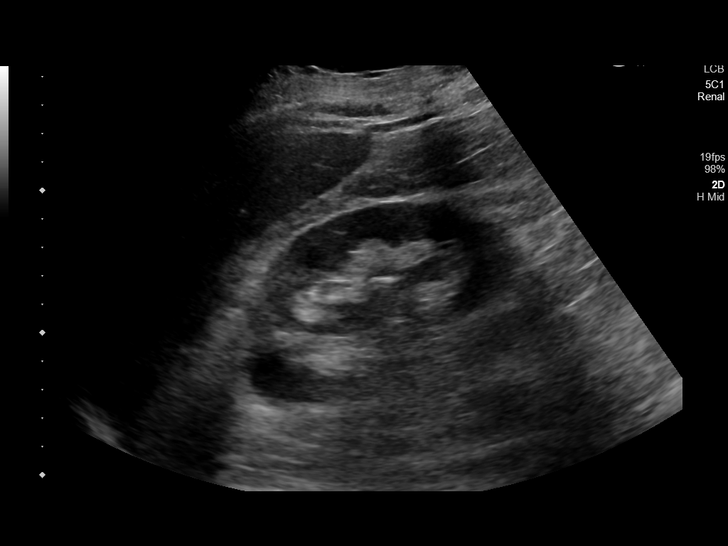
[im 20/54]
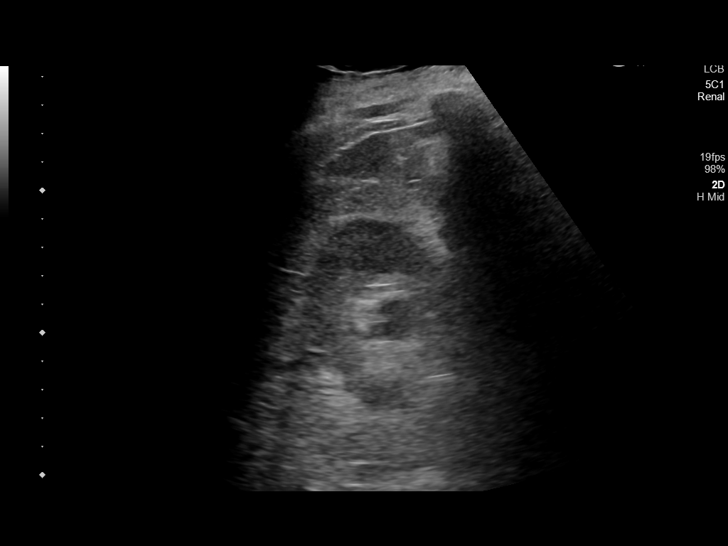
[im 25/54]
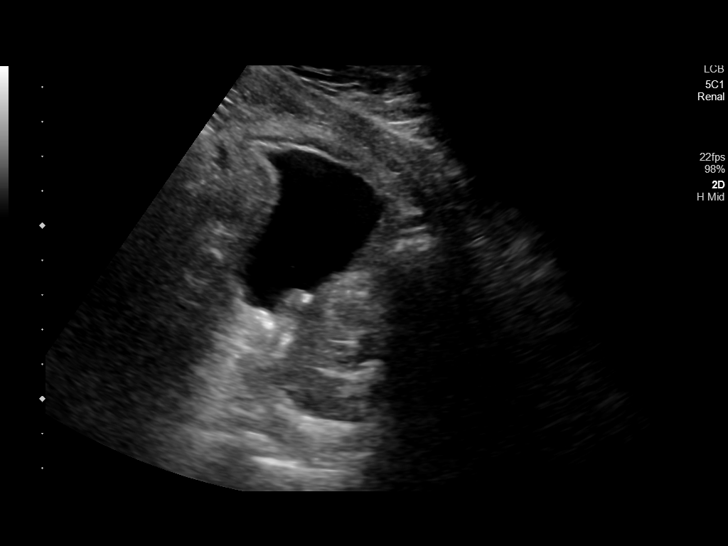
[im 29/54]
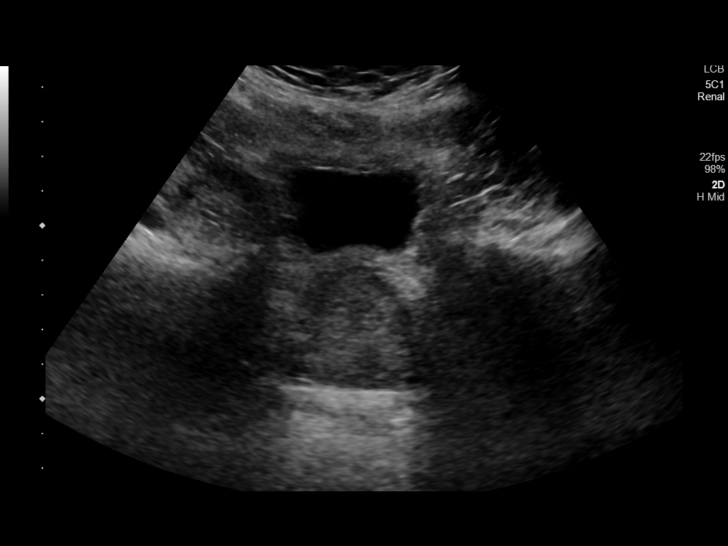
[im 34/54]
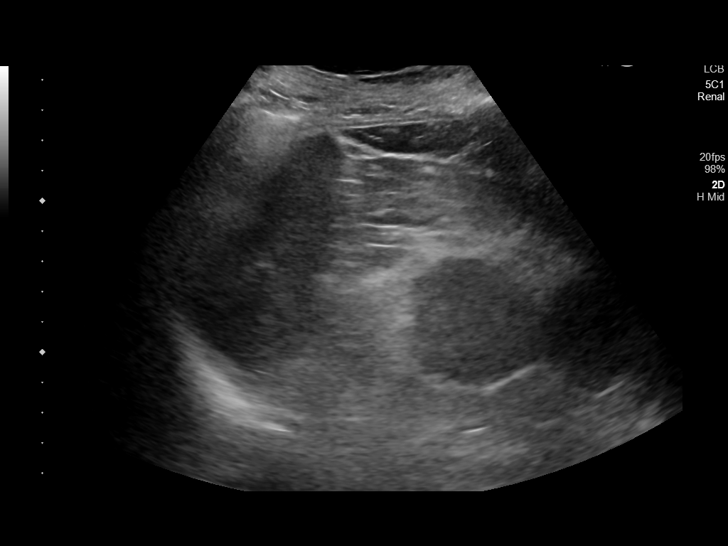
[im 36/54]
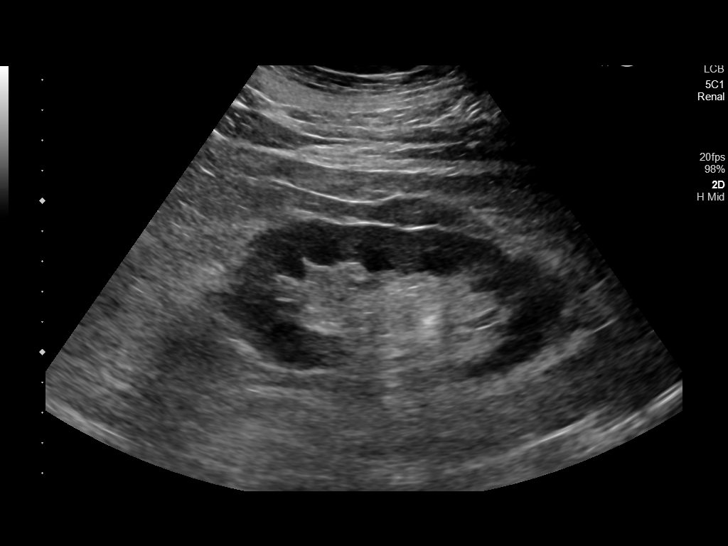
[im 40/54]
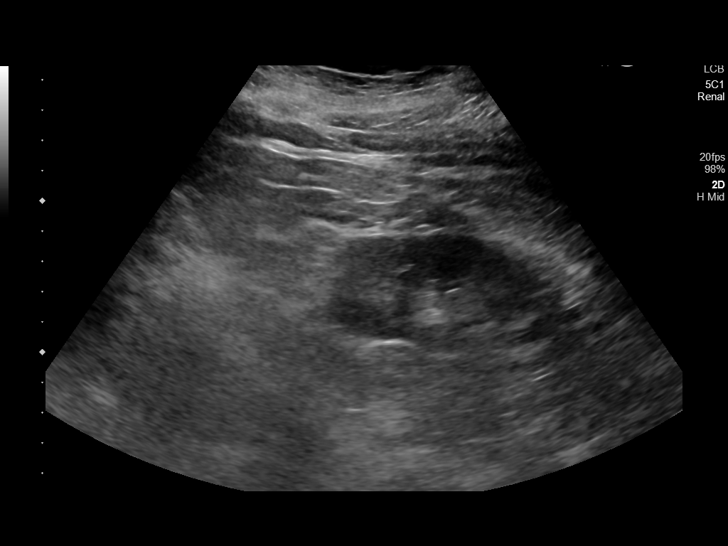
[im 45/54]
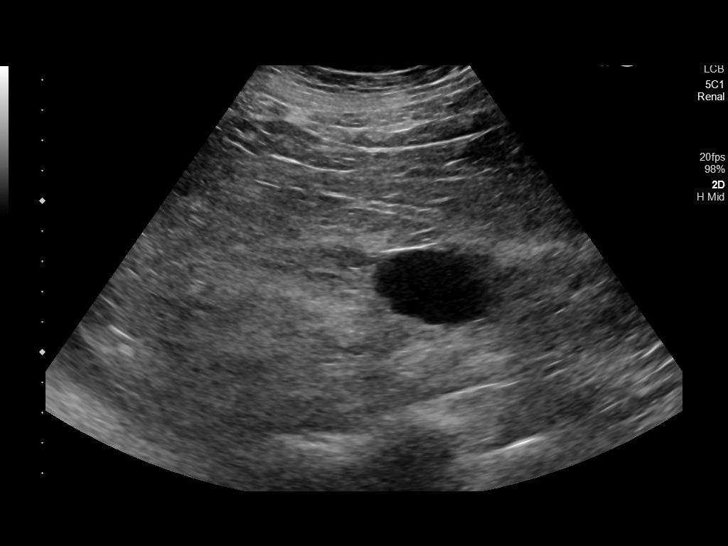
[im 49/54]
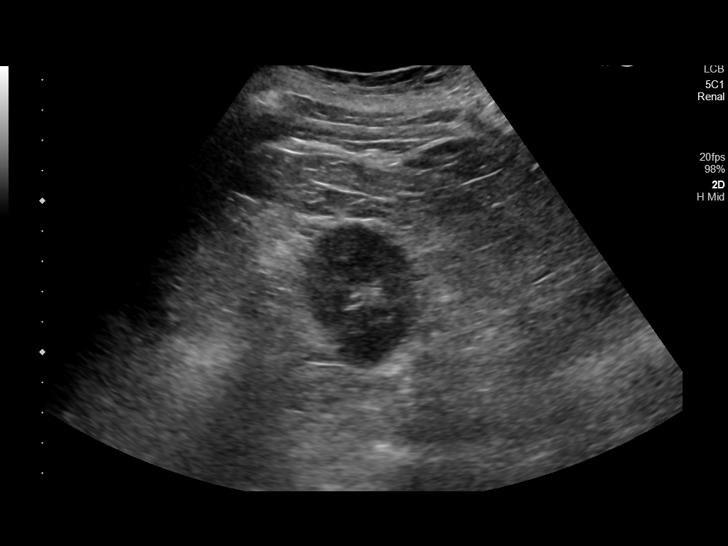
[im 54/54]
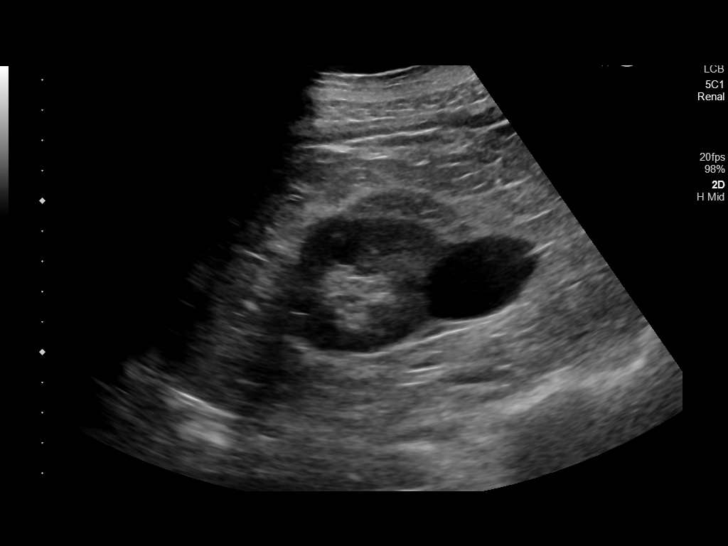

[14 of 25 positions shown; findings below may reference images not displayed]

FINDINGS: Right Kidney:

Renal measurements: 10.9 x 6.5 x 4.5 cm = volume: 166 mL. Cortical
thinning. Upper normal cortical echogenicity. RIGHT hydronephrosis.
Small cyst at upper pole 19 x 16 x 20 mm. Echogenic focus at mid
kidney 9 mm diameter consistent with calculus. No additional masses.

Left Kidney:

Renal measurements: 11.1 x 6.4 x 4.7 cm = volume: 175 mL. Cortical
thinning. Normal cortical echogenicity. Large shadowing
calcification at renal pelvis 15 mm diameter consistent with
calculus. No hydronephrosis. Cyst identified at inferior pole 3.7 x
2.6 x 3.7 cm.

Bladder:

Appears normal for degree of bladder distention.

Other:

None.
IMPRESSION: BILATERAL renal cysts and calculi as above.

RIGHT hydronephrosis.

## 2021-12-02 IMAGING — CR DG ABDOMEN 1V
1 series · 2 of 2 positions shown · non-contrast
Comparison: 05/28/2020

CLINICAL DATA: Pre lithotripsy, LEFT renal calculus

EXAM:
ABDOMEN - 1 VIEW

[Series 1: dg abd 1 view · 0.14mm/px · 2 of 2 slices shown]
[im 1/2]
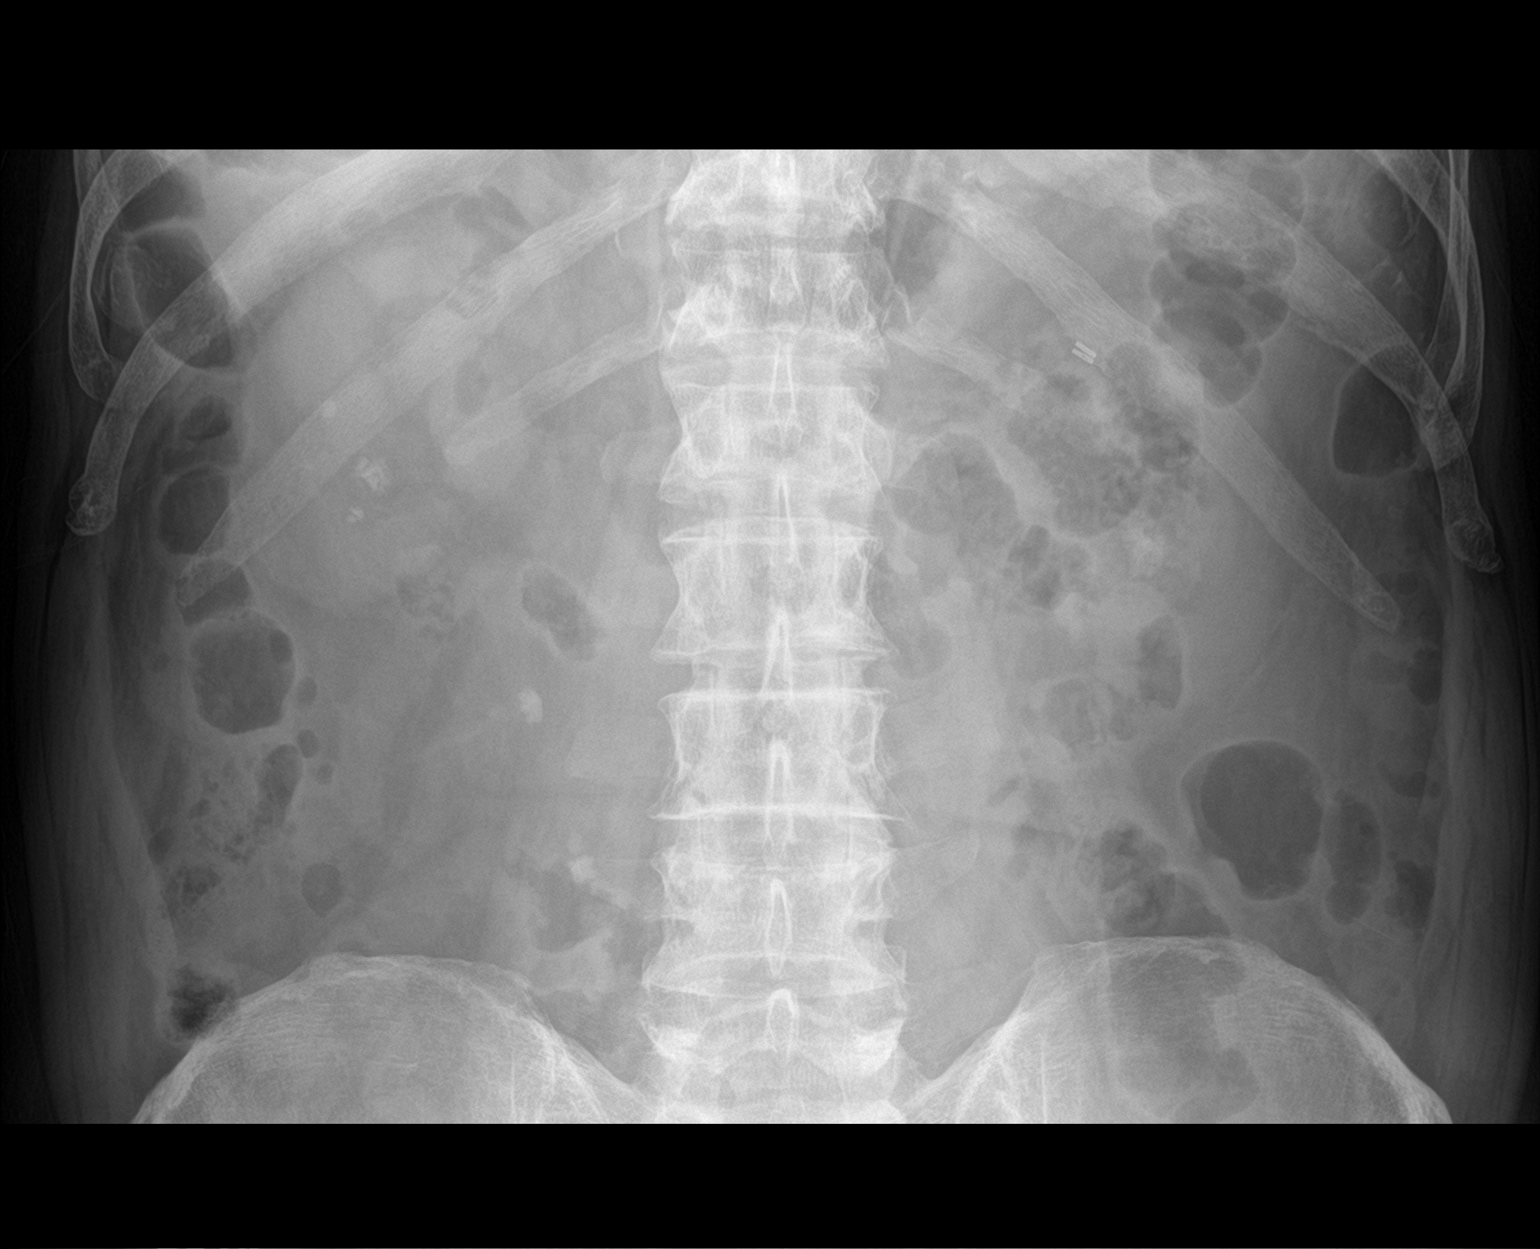
[im 2/2]
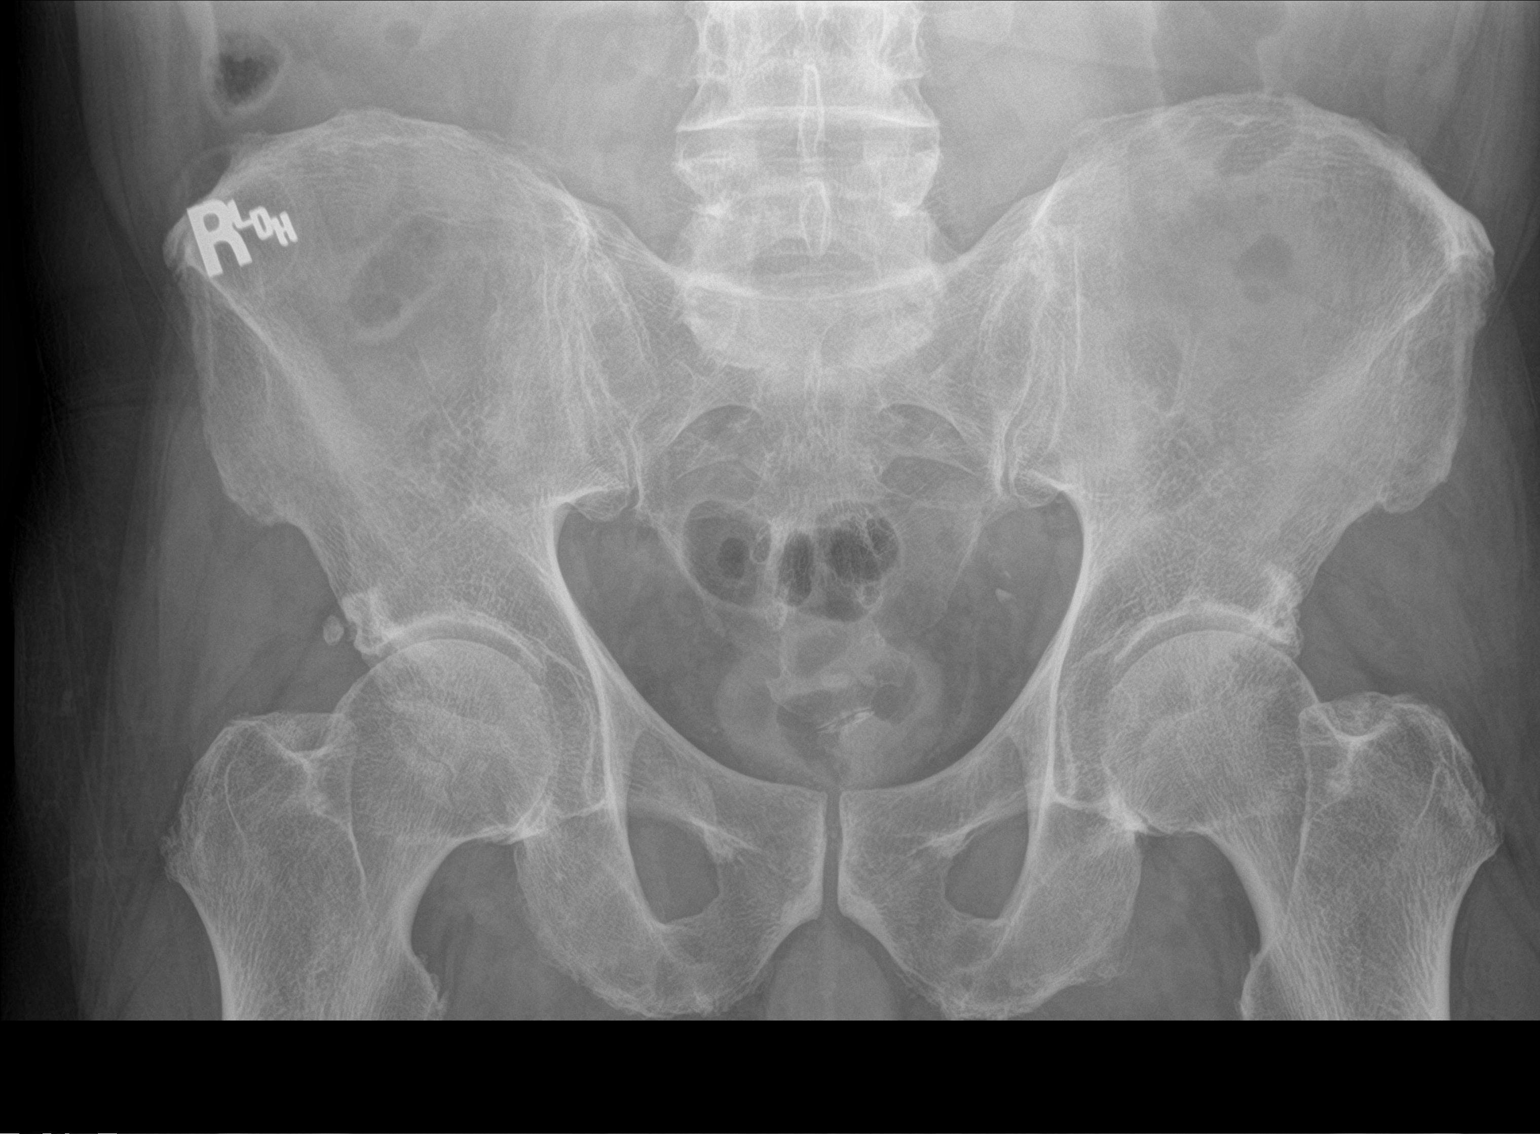

[2 of 2 positions shown; findings below may reference images not displayed]

FINDINGS: 1.8 x 1.1 cm calculus projects over mid to inferior LEFT kidney.

Multiple RIGHT renal calculi largest 11 x 9 mm at mid kidney.

Calculus projects over expected position of RIGHT ureteropelvic
junction, 10 x 6 mm, RIGHT paraspinal at the level of the superior
endplate of L3.

Density at the tip of the RIGHT transverse process of L4 noted, new,
potentially bowel artifact but cannot entirely exclude additional
RIGHT ureteral calculus 7 x 8 mm.

Bowel gas pattern normal.

Vascular calcification LEFT pelvis.

Osseous structures unremarkable.
IMPRESSION: 18 x 11 mm LEFT renal calculus.

Multiple RIGHT renal calculi with additional 10 x 6 mm suspected
RIGHT UPJ calculus.

Potential bowel artifact at the RIGHT transverse process of L4
though cannot completely exclude RIGHT ureteral calculus; this could
be assessed by CT if clinically indicated.

## 2021-12-10 ENCOUNTER — Other Ambulatory Visit: Payer: Self-pay | Admitting: Family Medicine

## 2021-12-10 DIAGNOSIS — K2270 Barrett's esophagus without dysplasia: Secondary | ICD-10-CM

## 2021-12-10 NOTE — Telephone Encounter (Signed)
Requested Prescriptions  Pending Prescriptions Disp Refills   pantoprazole (PROTONIX) 40 MG tablet [Pharmacy Med Name: PANTOPRAZOLE SODIUM 40 MG DR TAB] 90 tablet 1    Sig: TAKE 1 TABLET BY MOUTH DAILY     Gastroenterology: Proton Pump Inhibitors Passed - 12/10/2021  2:35 PM      Passed - Valid encounter within last 12 months    Recent Outpatient Visits          3 months ago Essential (primary) hypertension   [facility] Juline Patch, MD   3 months ago [facility] Juline Patch, MD   5 months ago Primary osteoarthritis of left knee   [facility] Juline Patch, MD   6 months ago Cellulitis of left upper extremity   [facility] Juline Patch, MD   9 months ago Essential (primary) hypertension   [facility] Juline Patch, MD      Future Appointments            In 3 months Juline Patch, MD [facility]

## 2022-02-02 ENCOUNTER — Ambulatory Visit (INDEPENDENT_AMBULATORY_CARE_PROVIDER_SITE_OTHER): Payer: Medicare PPO

## 2022-02-02 DIAGNOSIS — Z0000 Encounter for general adult medical examination without abnormal findings: Secondary | ICD-10-CM

## 2022-02-02 NOTE — Patient Instructions (Signed)
Mr. Timothy Hatfield , Thank you for taking time to come for your Medicare Wellness Visit. I appreciate your ongoing commitment to your health goals. Please review the following plan we discussed and let me know if I can assist you in the future.   Screening recommendations/referrals: Colonoscopy: no longer required  Recommended yearly ophthalmology/optometry visit for glaucoma screening and checkup Recommended yearly dental visit for hygiene and checkup  Vaccinations: Influenza vaccine: done 09/09/21 Pneumococcal vaccine: done 01/23/20 Tdap vaccine: due Shingles vaccine: Shingrix discussed. Please contact your pharmacy for coverage information.  Covid-19: declined  Advanced directives: Advance directive discussed with you today. I have provided a copy for you to complete at home and have notarized. Once this is complete please bring a copy in to our office so we can scan it into your chart.   Conditions/risks identified: Keep up the great work!  Next appointment: Follow up in one year for your annual wellness visit.   Preventive Care 77 Years and Older, Male Preventive care refers to lifestyle choices and visits with your health care provider that can promote health and wellness. What does preventive care include? A yearly physical exam. This is also called an annual well check. Dental exams once or twice a year. Routine eye exams. Ask your health care provider how often you should have your eyes checked. Personal lifestyle choices, including: Daily care of your teeth and gums. Regular physical activity. Eating a healthy diet. Avoiding tobacco and drug use. Limiting alcohol use. Practicing safe sex. Taking low doses of aspirin every day. Taking vitamin and mineral supplements as recommended by your health care provider. What happens during an annual well check? The services and screenings done by your health care provider during your annual well check will depend on your age, overall  health, lifestyle risk factors, and family history of disease. Counseling  Your health care provider may ask you questions about your: Alcohol use. Tobacco use. Drug use. Emotional well-being. Home and relationship well-being. Sexual activity. Eating habits. History of falls. Memory and ability to understand (cognition). Work and work Statistician. Screening  You may have the following tests or measurements: Height, weight, and BMI. Blood pressure. Lipid and cholesterol levels. These may be checked every 5 years, or more frequently if you are over 67 years old. Skin check. Lung cancer screening. You may have this screening every year starting at age 77 if you have a 30-pack-year history of smoking and currently smoke or have quit within the past 15 years. Fecal occult blood test (FOBT) of the stool. You may have this test every year starting at age 77. Flexible sigmoidoscopy or colonoscopy. You may have a sigmoidoscopy every 5 years or a colonoscopy every 10 years starting at age 77. Prostate cancer screening. Recommendations will vary depending on your family history and other risks. Hepatitis C blood test. Hepatitis B blood test. Sexually transmitted disease (STD) testing. Diabetes screening. This is done by checking your blood sugar (glucose) after you have not eaten for a while (fasting). You may have this done every 1-3 years. Abdominal aortic aneurysm (AAA) screening. You may need this if you are a current or former smoker. Osteoporosis. You may be screened starting at age 54 if you are at high risk. Talk with your health care provider about your test results, treatment options, and if necessary, the need for more tests. Vaccines  Your health care provider may recommend certain vaccines, such as: Influenza vaccine. This is recommended every year. Tetanus, diphtheria, and acellular pertussis (  Tdap, Td) vaccine. You may need a Td booster every 10 years. Zoster vaccine. You may  need this after age 77. Pneumococcal 13-valent conjugate (PCV13) vaccine. One dose is recommended after age 77. Pneumococcal polysaccharide (PPSV23) vaccine. One dose is recommended after age 77. Talk to your health care provider about which screenings and vaccines you need and how often you need them. This information is not intended to replace advice given to you by your health care provider. Make sure you discuss any questions you have with your health care provider. Document Released: 12/06/2015 Document Revised: 07/29/2016 Document Reviewed: 09/10/2015 Elsevier Interactive Patient Education  2017 [employer] Prevention in the Home Falls can cause injuries. They can happen to people of all ages. There are many things you can do to make your home safe and to help prevent falls. What can I do on the outside of my home? Regularly fix the edges of walkways and driveways and fix any cracks. Remove anything that might make you trip as you walk through a door, such as a raised step or threshold. Trim any bushes or trees on the path to your home. Use bright outdoor lighting. Clear any walking paths of anything that might make someone trip, such as rocks or tools. Regularly check to see if handrails are loose or broken. Make sure that both sides of any steps have handrails. Any raised decks and porches should have guardrails on the edges. Have any leaves, snow, or ice cleared regularly. Use sand or salt on walking paths during winter. Clean up any spills in your garage right away. This includes oil or grease spills. What can I do in the bathroom? Use night lights. Install grab bars by the toilet and in the tub and shower. Do not use towel bars as grab bars. Use non-skid mats or decals in the tub or shower. If you need to sit down in the shower, use a plastic, non-slip stool. Keep the floor dry. Clean up any water that spills on the floor as soon as it happens. Remove soap buildup in  the tub or shower regularly. Attach bath mats securely with double-sided non-slip rug tape. Do not have throw rugs and other things on the floor that can make you trip. What can I do in the bedroom? Use night lights. Make sure that you have a light by your bed that is easy to reach. Do not use any sheets or blankets that are too big for your bed. They should not hang down onto the floor. Have a firm chair that has side arms. You can use this for support while you get dressed. Do not have throw rugs and other things on the floor that can make you trip. What can I do in the kitchen? Clean up any spills right away. Avoid walking on wet floors. Keep items that you use a lot in easy-to-reach places. If you need to reach something above you, use a strong step stool that has a grab bar. Keep electrical cords out of the way. Do not use floor polish or wax that makes floors slippery. If you must use wax, use non-skid floor wax. Do not have throw rugs and other things on the floor that can make you trip. What can I do with my stairs? Do not leave any items on the stairs. Make sure that there are handrails on both sides of the stairs and use them. Fix handrails that are broken or loose. Make sure that handrails are  as long as the stairways. Check any carpeting to make sure that it is firmly attached to the stairs. Fix any carpet that is loose or worn. Avoid having throw rugs at the top or bottom of the stairs. If you do have throw rugs, attach them to the floor with carpet tape. Make sure that you have a light switch at the top of the stairs and the bottom of the stairs. If you do not have them, ask someone to add them for you. What else can I do to help prevent falls? Wear shoes that: Do not have high heels. Have rubber bottoms. Are comfortable and fit you well. Are closed at the toe. Do not wear sandals. If you use a stepladder: Make sure that it is fully opened. Do not climb a closed  stepladder. Make sure that both sides of the stepladder are locked into place. Ask someone to hold it for you, if possible. Clearly mark and make sure that you can see: Any grab bars or handrails. First and last steps. Where the edge of each step is. Use tools that help you move around (mobility aids) if they are needed. These include: Canes. Walkers. Scooters. Crutches. Turn on the lights when you go into a dark area. Replace any light bulbs as soon as they burn out. Set up your furniture so you have a clear path. Avoid moving your furniture around. If any of your floors are uneven, fix them. If there are any pets around you, be aware of where they are. Review your medicines with your doctor. Some medicines can make you feel dizzy. This can increase your chance of falling. Ask your doctor what other things that you can do to help prevent falls. This information is not intended to replace advice given to you by your health care provider. Make sure you discuss any questions you have with your health care provider. Document Released: 09/05/2009 Document Revised: 04/16/2016 Document Reviewed: 12/14/2014 Elsevier Interactive Patient Education  2017 [employer].

## 2022-02-02 NOTE — Progress Notes (Cosign Needed)
Subjective:   Timothy MAIELLO is a 76 y.o. male who presents for Medicare Annual/Subsequent preventive examination.  Virtual Visit via Telephone Note  I connected with  [name] on 02/02/22 at  1:20 PM EDT by telephone and verified that I am speaking with the correct person using two identifiers.  Location: Patient: home Provider: [facility] Persons participating in the virtual visit: [employer]   I discussed the limitations, risks, security and privacy concerns of performing an evaluation and management service by telephone and the availability of in person appointments. The patient expressed understanding and agreed to proceed.  Interactive audio and video telecommunications were attempted between this nurse and patient, however failed, due to patient having technical difficulties OR patient did not have access to video capability.  We continued and completed visit with audio only.  Some vital signs may be absent or patient reported.   [name], LPN   Review of Systems           Objective:    There were no vitals filed for this visit. There is no height or weight on file to calculate BMI.  Advanced Directives 02/02/2022 01/29/2021 07/19/2020 07/11/2020 03/15/2018 03/08/2018 01/03/2018  Does Patient Have a Medical Advance Directive? No No No No No No No  Would patient like information on creating a medical advance directive? Yes (MAU/Ambulatory/Procedural Areas - Information given) Yes (MAU/Ambulatory/Procedural Areas - Information given) No - Patient declined No - Patient declined Yes (MAU/Ambulatory/Procedural Areas - Information given) Yes (MAU/Ambulatory/Procedural Areas - Information given) No - Patient declined    Current Medications (verified) Outpatient Encounter Medications as of 02/02/2022  Medication Sig   acetaminophen (TYLENOL) 500 MG tablet Take 1,000 mg by mouth daily as needed for moderate pain.   aspirin EC 81 MG tablet Take 81 mg by  mouth daily.   benazepril-hydrochlorthiazide (LOTENSIN HCT) 10-12.5 MG tablet Take 1 tablet by mouth daily.   Coenzyme Q10 (COQ-10) 100 MG CAPS Take 100 mg by mouth at bedtime.   Dutasteride-Tamsulosin HCl 0.5-0.4 MG CAPS TAKE 1 CAPSULE BY MOUTH ONCE DAILY   EPINEPHrine 0.3 mg/0.3 mL IJ SOAJ injection Inject 0.3 mg into the muscle as needed for anaphylaxis.   fluticasone (FLONASE) 50 MCG/ACT nasal spray Place 1 spray into both nostrils daily. otc   loratadine (CLARITIN) 10 MG tablet Take 1 tablet (10 mg total) by mouth daily. For allergies   meloxicam (MOBIC) 15 MG tablet TAKE 1 TABLET BY MOUTH DAILY   metoprolol succinate (TOPROL-XL) 25 MG 24 hr tablet Take 1 tablet (25 mg total) by mouth daily.   Multiple Vitamin (MULTIVITAMIN WITH MINERALS) TABS tablet Take 1 tablet by mouth at bedtime.   mupirocin ointment (BACTROBAN) 2 % Apply 1 application topically 2 (two) times daily.   nitroGLYCERIN (NITROSTAT) 0.4 MG SL tablet Place 1 tablet (0.4 mg total) under the tongue every 5 (five) minutes as needed for chest pain.   pantoprazole (PROTONIX) 40 MG tablet TAKE 1 TABLET BY MOUTH DAILY   rosuvastatin (CRESTOR) 5 MG tablet Take 5 mg by mouth daily.   sertraline (ZOLOFT) 50 MG tablet Take 1 tablet (50 mg total) by mouth daily. One tablet daily   [DISCONTINUED] atorvastatin (LIPITOR) 80 MG tablet Take 1 tablet (80 mg total) by mouth daily.   No facility-administered encounter medications on file as of 02/02/2022.    Allergies (verified) Bee venom and Ciprofloxacin   History: Past Medical History:  Diagnosis Date   Allergy    BPH (benign  prostatic hyperplasia)    Coronary artery disease    GERD (gastroesophageal reflux disease)    History of kidney stones    Hyperlipidemia    Hypertension    Pneumonia    Renal disorder    kidney stones   Sleep apnea    CPAP   Past Surgical History:  Procedure Laterality Date   CARDIAC CATHETERIZATION Left 09/09/2016   Procedure: Left Heart Cath and  Coronary Angiography;  Surgeon: Yolonda Kida, MD;  Location: [place] [facility];  Service: Cardiovascular;  Laterality: Left;   COLONOSCOPY  2013   normal/ Dr Vira Agar- cleared for 10 yrs   COLONOSCOPY WITH PROPOFOL N/A 01/03/2018   Procedure: COLONOSCOPY WITH PROPOFOL;  Surgeon: Manya Silvas, MD;  [facility]: [facility];  Service: Endoscopy;  Laterality: N/A;   CORONARY ARTERY BYPASS GRAFT     CYSTOSCOPY W/ RETROGRADES Bilateral 07/11/2020   Procedure: CYSTOSCOPY WITH RETROGRADE PYELOGRAM;  Surgeon: Billey Co, MD;  Location: [facility];  Service: Urology;  Laterality: Bilateral;   CYSTOSCOPY W/ RETROGRADES  07/26/2020   Procedure: CYSTOSCOPY WITH RETROGRADE PYELOGRAM;  Surgeon: Billey Co, MD;  Location: [facility];  Service: Urology;;   CYSTOSCOPY W/ URETERAL STENT REMOVAL Right 07/26/2020   Procedure: CYSTOSCOPY WITH STENT REMOVAL;  Surgeon: Billey Co, MD;  Location: [facility];  Service: Urology;  Laterality: Right;   CYSTOSCOPY/URETEROSCOPY/HOLMIUM LASER/STENT PLACEMENT Bilateral 07/11/2020   Procedure: CYSTOSCOPY/URETEROSCOPY/HOLMIUM LASER/STENT PLACEMENT;  Surgeon: Billey Co, MD;  Location: [facility];  Service: Urology;  Laterality: Bilateral;   CYSTOSCOPY/URETEROSCOPY/HOLMIUM LASER/STENT PLACEMENT Left 07/26/2020   Procedure: CYSTOSCOPY/URETEROSCOPY/HOLMIUM LASER/STENT EXCHANGE;  Surgeon: Billey Co, MD;  Location: [facility];  Service: Urology;  Laterality: Left;   ESOPHAGOGASTRODUODENOSCOPY (EGD) WITH PROPOFOL N/A 01/03/2018   Procedure: ESOPHAGOGASTRODUODENOSCOPY (EGD) WITH PROPOFOL;  Surgeon: Manya Silvas, MD;  Location: [facility];  Service: Endoscopy;  Laterality: N/A;   EXTRACORPOREAL SHOCK WAVE LITHOTRIPSY Left 07/11/2020   Procedure: EXTRACORPOREAL SHOCK WAVE LITHOTRIPSY (ESWL);  Surgeon: Billey Co, MD;  Location: [facility];  Service: Urology;  Laterality: Left;   EYE SURGERY     HERNIA REPAIR     IR NEPHROSTOMY PLACEMENT RIGHT   03/15/2018   kidney stones     lithotripsy   LITHOTRIPSY     NEPHROLITHOTOMY Right 03/15/2018   Procedure: NEPHROLITHOTOMY PERCUTANEOUS;  Surgeon: Abbie Sons, MD;  Location: [facility];  Service: Urology;  Laterality: Right;   STONE EXTRACTION WITH BASKET  07/11/2020   Procedure: BLADDER STONE EXTRACTION;  Surgeon: Billey Co, MD;  Location: [facility];  Service: Urology;;   UPPER GI ENDOSCOPY  2013   Family History  Problem Relation Age of Onset   Arrhythmia Mother    Coronary artery disease Father    Diabetes Father    Hyperlipidemia Brother    Social History   Socioeconomic History   Marital status: Married    Spouse name: Not on file   Number of children: Not on file   Years of education: Not on file   Highest education level: Not on file  Occupational History   Not on file  Tobacco Use   Smoking status: Never   Smokeless tobacco: Never  Vaping Use   Vaping Use: Never used  Substance and Sexual Activity   Alcohol use: No    Alcohol/week: 0.0 standard drinks   Drug use: No   Sexual activity: Yes  Other Topics Concern   Not on file  Social History Narrative  Not on file   Social Determinants of Health   Financial Resource Strain: Low Risk    Difficulty of Paying Living Expenses: Not hard at all  Food Insecurity: No Food Insecurity   Worried About Charity fundraiser in the Last Year: Never true   [place] in the Last Year: Never true  Transportation Needs: No Transportation Needs   Lack of Transportation (Medical): No   Lack of Transportation (Non-Medical): No  Physical Activity: Sufficiently Active   Days of Exercise per Week: 7 days   Minutes of Exercise per Session: 30 min  Stress: No Stress Concern Present   Feeling of Stress : Not at all  Social Connections: Moderately Isolated   Frequency of Communication with Friends and Family: Not on file   Frequency of Social Gatherings with Friends and Family: More than three times a week   Attends  Religious Services: Never   Marine scientist or Organizations: No   Attends Music therapist: Never   Marital Status: Married    Tobacco Counseling Counseling given: Not Answered   Clinical Intake:  Pre-visit preparation completed: Yes  Pain : No/denies pain     Nutritional Risks: None Diabetes: No  How often do you need to have someone help you when you read instructions, pamphlets, or other written materials from your doctor or pharmacy?: 1 - Never   Interpreter Needed?: No  Information entered by :: [name] LPN   Activities of Daily Living In your present state of health, do you have any difficulty performing the following activities: 05/22/2021  Hearing? N  Vision? N  Difficulty concentrating or making decisions? N  Walking or climbing stairs? N  Dressing or bathing? N  Doing errands, shopping? N  Some recent data might be hidden    Patient Care Team: Juline Patch, MD as PCP - General (Family Medicine)  Indicate any recent Medical Services you may have received from other than [facility] providers in the past year (date may be approximate).     Assessment:   This is a routine wellness examination for [place].  Hearing/Vision screen Hearing Screening - Comments:: Pt denies hearing difficulty Vision Screening - Comments:: Annual vision screenings done by Dr[facility] Thomasene [facility]  Dietary issues and exercise activities discussed:     Goals Addressed   None    Depression Screen PHQ 2/9 Scores 02/02/2022 09/09/2021 08/18/2021 06/20/2021 05/22/2021 03/10/2021 01/29/2021  PHQ - 2 Score 0 0 0 0 0 2 0  PHQ- 9 Score - 0 0 0 0 3 -    Fall Risk Fall Risk  02/02/2022 08/18/2021 05/22/2021 01/29/2021 09/05/2020  Falls in the past year? 0 0 0 0 0  Comment - - - - -  Number falls in past yr: 0 0 - 0 -  Injury with Fall? 0 0 - 0 -  Risk for fall due to : No Fall Risks No Fall Risks - No Fall Risks -  Follow up Falls prevention discussed  Falls evaluation completed Falls evaluation completed Falls prevention discussed Falls evaluation completed    FALL RISK PREVENTION PERTAINING TO THE HOME:  Any stairs in or around the home? Yes  If so, are there any without handrails? No  Home free of loose throw rugs in walkways, pet beds, electrical cords, etc? Yes  Adequate lighting in your home to reduce risk of falls? Yes   ASSISTIVE DEVICES UTILIZED TO PREVENT FALLS:  Life alert? No  Use of a cane, walker or w/c? No  Grab bars in the bathroom? Yes  Shower chair or bench in shower? Yes  Elevated toilet seat or a handicapped toilet? No   TIMED UP AND GO:  Was the test performed? No . Telephonic visit.   Cognitive Function: Normal cognitive status assessed by direct observation by this Nurse Health Advisor. No abnormalities found.          Immunizations Immunization History  Administered Date(s) Administered   Fluad Quad(high Dose 65+) 08/07/2019, 09/05/2020, 09/09/2021   Influenza, High Dose Seasonal PF 09/28/2017, 10/03/2018   Influenza,inj,Quad PF,6+ Mos 10/25/2014, 08/02/2015, 08/17/2016   Influenza-Unspecified 08/02/2015, 08/23/2016   Pneumococcal Conjugate-13 01/23/2020   Pneumococcal Polysaccharide-23 09/28/2017    TDAP status: Due, Education has been provided regarding the importance of this vaccine. Advised may receive this vaccine at local pharmacy or Health Dept. Aware to provide a copy of the vaccination record if obtained from local pharmacy or Health Dept. Verbalized acceptance and understanding.  Flu Vaccine status: Up to date  Pneumococcal vaccine status: Up to date  Covid-19 vaccine status: Declined, Education has been provided regarding the importance of this vaccine but patient still declined. Advised may receive this vaccine at local pharmacy or Health Dept.or vaccine clinic. Aware to provide a copy of the vaccination record if obtained from local pharmacy or Health Dept. Verbalized acceptance and  understanding.  Qualifies for Shingles Vaccine? Yes   Zostavax completed No   Shingrix Completed?: No.    Education has been provided regarding the importance of this vaccine. Patient has been advised to call insurance company to determine out of pocket expense if they have not yet received this vaccine. Advised may also receive vaccine at local pharmacy or Health Dept. Verbalized acceptance and understanding.  Screening Tests Health Maintenance  Topic Date Due   TETANUS/TDAP  Never done   Zoster Vaccines- Shingrix (1 of 2) Never done   Pneumonia Vaccine 76+ Years old  Completed   INFLUENZA VACCINE  Completed   HPV VACCINES  Aged Out   COLONOSCOPY (Pts 45-54yr Insurance coverage will need to be confirmed)  Discontinued   COVID-19 Vaccine  Discontinued   Hepatitis C Screening  Discontinued    Health Maintenance  Health Maintenance Due  Topic Date Due   TETANUS/TDAP  Never done   Zoster Vaccines- Shingrix (1 of 2) Never done    Colorectal cancer screening: No longer required.   Lung Cancer Screening: (Low Dose CT Chest recommended if Age 77-80years, 30 pack-year currently smoking OR have quit w/in 15years.) does not qualify.   Additional Screening:  Hepatitis C Screening: does qualify; postponed  Vision Screening: Recommended annual ophthalmology exams for early detection of glaucoma and other disorders of the eye. Is the patient up to date with their annual eye exam?  Yes  Who is the provider or what is the name of the office in which the patient attends annual [facility] exams[facility]   Dental Screening: Recommended annual dental exams for proper oral hygiene  Community Resource Referral / Chronic Care Management: CRR required this visit?  No   CCM required this visit?  No      Plan:     I have personally reviewed and noted the following in the patients chart:   Medical and social history Use of alcohol, tobacco or illicit drugs  Current  medications and supplements including opioid prescriptions. Patient is not currently taking opioid prescriptions. Functional ability and status  Nutritional status Physical activity Advanced directives List of other physicians Hospitalizations, surgeries, and ER visits in previous 12 months Vitals Screenings to include cognitive, depression, and falls Referrals and appointments  In addition, I have reviewed and discussed with patient certain preventive protocols, quality metrics, and best practice recommendations. A written personalized care plan for preventive services as well as general preventive health recommendations were provided to patient.     [name], LPN   [id number]   Nurse Notes: none

## 2022-03-10 ENCOUNTER — Encounter: Payer: Self-pay | Admitting: Family Medicine

## 2022-03-10 ENCOUNTER — Ambulatory Visit
Admission: RE | Admit: 2022-03-10 | Discharge: 2022-03-10 | Disposition: A | Discharge status: Home / Self Care | Payer: Medicare PPO | Attending: Family Medicine | Admitting: Family Medicine

## 2022-03-10 ENCOUNTER — Ambulatory Visit: Payer: Medicare PPO | Admitting: Family Medicine

## 2022-03-10 ENCOUNTER — Ambulatory Visit
Admission: RE | Admit: 2022-03-10 | Discharge: 2022-03-10 | Disposition: A | Discharge status: Home / Self Care | Payer: Medicare PPO | Source: Ambulatory Visit | Attending: Family Medicine | Admitting: Family Medicine

## 2022-03-10 VITALS — BP 130/80 | HR 72 | Ht 67.0 in | Wt 200.0 lb

## 2022-03-10 DIAGNOSIS — F329 Major depressive disorder, single episode, unspecified: Secondary | ICD-10-CM

## 2022-03-10 DIAGNOSIS — G8929 Other chronic pain: Secondary | ICD-10-CM

## 2022-03-10 DIAGNOSIS — I10 Essential (primary) hypertension: Secondary | ICD-10-CM

## 2022-03-10 DIAGNOSIS — K2270 Barrett's esophagus without dysplasia: Secondary | ICD-10-CM | POA: Diagnosis not present

## 2022-03-10 DIAGNOSIS — M25561 Pain in right knee: Secondary | ICD-10-CM | POA: Diagnosis not present

## 2022-03-10 DIAGNOSIS — E782 Mixed hyperlipidemia: Secondary | ICD-10-CM | POA: Diagnosis not present

## 2022-03-10 DIAGNOSIS — J301 Allergic rhinitis due to pollen: Secondary | ICD-10-CM | POA: Diagnosis not present

## 2022-03-10 MED ORDER — MELOXICAM 15 MG PO TABS: 15.0000 mg | ORAL_TABLET | Freq: Every day | ORAL | 2 refills | Status: DC

## 2022-03-10 MED ORDER — BENAZEPRIL-HYDROCHLOROTHIAZIDE 10-12.5 MG PO TABS: 1.0000 | ORAL_TABLET | Freq: Every day | ORAL | 1 refills | Status: DC

## 2022-03-10 MED ORDER — PANTOPRAZOLE SODIUM 40 MG PO TBEC: 40.0000 mg | DELAYED_RELEASE_TABLET | Freq: Every day | ORAL | 1 refills | Status: DC

## 2022-03-10 MED ORDER — SERTRALINE HCL 50 MG PO TABS: 50.0000 mg | ORAL_TABLET | Freq: Every day | ORAL | 1 refills | Status: DC

## 2022-03-10 MED ORDER — METOPROLOL SUCCINATE ER 25 MG PO TB24: 25.0000 mg | ORAL_TABLET | Freq: Every day | ORAL | 1 refills | Status: DC

## 2022-03-10 MED ORDER — LORATADINE 10 MG PO TABS: 10.0000 mg | ORAL_TABLET | Freq: Every day | ORAL | 11 refills | Status: DC

## 2022-03-10 NOTE — Patient Instructions (Signed)
Mediterranean Diet ?A Mediterranean diet refers to food and lifestyle choices that are based on the traditions of countries located on the [place]. It focuses on eating more fruits, vegetables, whole grains, beans, nuts, seeds, and heart-healthy fats, and eating less dairy, meat, eggs, and processed foods with added sugar, salt, and fat. This way of eating has been shown to help prevent certain conditions and improve outcomes for people who have chronic diseases, like kidney disease and heart disease. ?What are tips for following this plan? ?Reading food labels ?Check the serving size of packaged foods. For foods such as rice and pasta, the serving size refers to the amount of cooked product, not dry. ?Check the total fat in packaged foods. Avoid foods that have saturated fat or trans fats. ?Check the ingredient list for added sugars, such as corn syrup. ?Shopping ? ?Buy a variety of foods that offer a balanced diet, including: ?Fresh fruits and vegetables (produce). ?Grains, beans, nuts, and seeds. Some of these may be available in unpackaged forms or large amounts (in bulk). ?Fresh seafood. ?Poultry and eggs. ?Low-fat dairy products. ?Buy whole ingredients instead of prepackaged foods. ?Buy fresh fruits and vegetables in-season from local farmers markets. ?Buy plain frozen fruits and vegetables. ?If you do not have access to quality fresh seafood, buy precooked frozen shrimp or canned fish, such as tuna, salmon, or sardines. ?Stock your pantry so you always have certain foods on hand, such as olive oil, canned tuna, canned tomatoes, rice, pasta, and beans. ?Cooking ?Cook foods with extra-virgin olive oil instead of using butter or other vegetable oils. ?Have meat as a side dish, and have vegetables or grains as your main dish. This means having meat in small portions or adding small amounts of meat to foods like pasta or stew. ?Use beans or vegetables instead of meat in common dishes like chili or  lasagna. ?Experiment with different cooking methods. Try roasting, broiling, steaming, and saut?ing vegetables. ?Add frozen vegetables to soups, stews, pasta, or rice. ?Add nuts or seeds for added healthy fats and plant protein at each meal. You can add these to yogurt, salads, or vegetable dishes. ?Marinate fish or vegetables using olive oil, lemon juice, garlic, and fresh herbs. ?Meal planning ?Plan to eat one vegetarian meal one day each week. Try to work up to two vegetarian meals, if possible. ?Eat seafood two or more times a week. ?Have healthy snacks readily available, such as: ?Vegetable sticks with hummus. ?Greek yogurt. ?Fruit and nut trail mix. ?Eat balanced meals throughout the week. This includes: ?Fruit: 2-3 servings a day. ?Vegetables: 4-5 servings a day. ?Low-fat dairy: 2 servings a day. ?Fish, poultry, or lean meat: 1 serving a day. ?Beans and legumes: 2 or more servings a week. ?Nuts and seeds: 1-2 servings a day. ?Whole grains: 6-8 servings a day. ?Extra-virgin olive oil: 3-4 servings a day. ?Limit red meat and sweets to only a few servings a month. ?Lifestyle ? ?Cook and eat meals together with your family, when possible. ?Drink enough fluid to keep your urine pale yellow. ?Be physically active every day. This includes: ?Aerobic exercise like running or swimming. ?Leisure activities like gardening, walking, or housework. ?Get 7-8 hours of sleep each night. ?If recommended by your health care provider, drink red wine in moderation. This means 1 glass a day for nonpregnant women and 2 glasses a day for men. A glass of wine equals 5 oz (150 mL). ?What foods should I eat? ?Fruits ?Apples. Apricots. Avocado. Berries. Bananas. Cherries. Dates.   Figs. Grapes. Lemons. Melon. Oranges. Peaches. Plums. Pomegranate. ?Vegetables ?Artichokes. Beets. Broccoli. Cabbage. Carrots. Eggplant. Green beans. Chard. Kale. Spinach. Onions. Leeks. Peas. Squash. Tomatoes. Peppers. Radishes. ?Grains ?Whole-grain pasta. Brown  rice. Bulgur wheat. Polenta. Couscous. Whole-wheat bread. Oatmeal. Quinoa. ?Meats and other proteins ?Beans. Almonds. Sunflower seeds. Pine nuts. Peanuts. Cod. Salmon. Scallops. Shrimp. Tuna. Tilapia. Clams. Oysters. Eggs. Poultry without skin. ?Dairy ?Low-fat milk. Cheese. Greek yogurt. ?Fats and oils ?Extra-virgin olive oil. Avocado oil. Grapeseed oil. ?Beverages ?Water. Red wine. Herbal tea. ?Sweets and desserts ?Greek yogurt with honey. Baked apples. Poached pears. Trail mix. ?Seasonings and condiments ?Basil. Cilantro. Coriander. Cumin. Mint. Parsley. Sage. Rosemary. Tarragon. Garlic. Oregano. Thyme. Pepper. Balsamic vinegar. Tahini. Hummus. Tomato sauce. Olives. Mushrooms. ?The items listed above may not be a complete list of foods and beverages you can eat. Contact a dietitian for more information. ?What foods should I limit? ?This is a list of foods that should be eaten rarely or only on special occasions. ?Fruits ?Fruit canned in syrup. ?Vegetables ?Deep-fried potatoes (french fries). ?Grains ?Prepackaged pasta or rice dishes. Prepackaged cereal with added sugar. Prepackaged snacks with added sugar. ?Meats and other proteins ?Beef. Pork. Lamb. Poultry with skin. Hot dogs. Bacon. ?Dairy ?Ice cream. Sour cream. Whole milk. ?Fats and oils ?Butter. Canola oil. Vegetable oil. Beef fat (tallow). Lard. ?Beverages ?Juice. Sugar-sweetened soft drinks. Beer. Liquor and spirits. ?Sweets and desserts ?Cookies. Cakes. Pies. Candy. ?Seasonings and condiments ?Mayonnaise. Pre-made sauces and marinades. ?The items listed above may not be a complete list of foods and beverages you should limit. Contact a dietitian for more information. ?Summary ?The Mediterranean diet includes both food and lifestyle choices. ?Eat a variety of fresh fruits and vegetables, beans, nuts, seeds, and whole grains. ?Limit the amount of red meat and sweets that you eat. ?If recommended by your health care provider, drink red wine in moderation.  This means 1 glass a day for nonpregnant women and 2 glasses a day for men. A glass of wine equals 5 oz (150 mL). ?This information is not intended to replace advice given to you by your health care provider. Make sure you discuss any questions you have with your health care provider. ?Document Revised: 12/15/2019 Document Reviewed: 10/12/2019 ?Elsevier Patient Education ? 2023 Elsevier Inc. ? ?

## 2022-03-10 NOTE — Progress Notes (Signed)
? ? ?Date:  03/10/2022  ? ?Name:  Timothy Hatfield   DOB:  1946-06-27   MRN:  [id number] ? ? ?Chief Complaint: Depression, Gastroesophageal Reflux, Hypertension, Allergic Rhinitis , and Knee Pain (Takes meloxicam for this) ? ?Depression ?       This is a chronic problem.  The current episode started more than 1 year ago.   The problem occurs intermittently.  The problem has been gradually improving since onset.  Associated symptoms include no decreased concentration, no fatigue, no helplessness, no hopelessness, does not have insomnia, not irritable, no restlessness, no decreased interest, no appetite change, no body aches, no myalgias, no headaches, no indigestion, not sad and no suicidal ideas.     The symptoms are aggravated by nothing.  Past treatments include SSRIs - Selective serotonin reuptake inhibitors.  Previous treatment provided moderate relief.   Pertinent negatives include no anxiety. ?Gastroesophageal Reflux ?He reports no abdominal pain, no chest pain, no coughing, no dysphagia, no early satiety, no nausea, no sore throat, no stridor or no wheezing. This is a chronic problem. The current episode started more than 1 year ago. The problem has been gradually improving. The symptoms are aggravated by certain foods. Pertinent negatives include no anemia, fatigue, melena, muscle weakness, orthopnea or weight loss. He has tried a PPI for the symptoms.  ?Hypertension ?This is a chronic problem. The current episode started more than 1 year ago. The problem has been gradually improving since onset. The problem is controlled. Pertinent negatives include no anxiety, blurred vision, chest pain, headaches, malaise/fatigue, neck pain, orthopnea, palpitations, peripheral edema, PND, shortness of breath or sweats. Past treatments include ACE inhibitors, beta blockers and diuretics. The current treatment provides moderate improvement. There is no history of angina, kidney disease, CAD/MI, CVA, heart failure, left  ventricular hypertrophy, PVD or retinopathy. There is no history of chronic renal disease, a hypertension causing med or renovascular disease.  ?Knee Pain  ?Incident onset: years. There was no injury mechanism. The pain is present in the right knee. The quality of the pain is described as aching. The pain is at a severity of 7/10. The pain is moderate. The pain has been Fluctuating since onset. Pertinent negatives include no inability to bear weight or loss of motion. The symptoms are aggravated by weight bearing and movement. He has tried NSAIDs for the symptoms. The treatment provided mild relief.  ? ?Lab Results  ?Component Value Date  ? NA 139 07/24/2020  ? K 4.0 07/24/2020  ? CO2 26 07/24/2020  ? GLUCOSE 121 (H) 07/24/2020  ? BUN 25 (H) 07/24/2020  ? CREATININE 1.58 (H) 07/24/2020  ? CALCIUM 9.1 07/24/2020  ? GFRNONAA 42 (L) 07/24/2020  ? ?Lab Results  ?Component Value Date  ? CHOL 120 09/05/2020  ? HDL 48 09/05/2020  ? [place] 54 09/05/2020  ? TRIG 94 09/05/2020  ? CHOLHDL 2.6 10/03/2018  ? ?No results found for: TSH ?No results found for: HGBA1C ?Lab Results  ?Component Value Date  ? WBC 7.0 07/24/2020  ? HGB 14.6 07/24/2020  ? HCT 42.7 07/24/2020  ? MCV 94.7 07/24/2020  ? PLT 261 07/24/2020  ? ?Lab Results  ?Component Value Date  ? ALT 24 03/27/2020  ? AST 25 03/27/2020  ? ALKPHOS 64 03/27/2020  ? BILITOT 0.9 03/27/2020  ? ?No results found for: 25OHVITD2, [place], VD25OH  ? ?Review of Systems  ?Constitutional:  Negative for appetite change, chills, fatigue, fever, malaise/fatigue and weight loss.  ?HENT:  Negative for drooling,  ear discharge, ear pain and sore throat.   ?Eyes:  Negative for blurred vision.  ?Respiratory:  Negative for cough, shortness of breath and wheezing.   ?Cardiovascular:  Negative for chest pain, palpitations, orthopnea, leg swelling and PND.  ?Gastrointestinal:  Negative for abdominal pain, blood in stool, constipation, diarrhea, dysphagia, melena and nausea.  ?Endocrine: Negative  for polydipsia.  ?Genitourinary:  Negative for dysuria, frequency, hematuria and urgency.  ?Musculoskeletal:  Negative for back pain, myalgias, muscle weakness and neck pain.  ?Skin:  Negative for rash.  ?Allergic/Immunologic: Negative for environmental allergies.  ?Neurological:  Negative for dizziness and headaches.  ?Hematological:  Does not bruise/bleed easily.  ?Psychiatric/Behavioral:  Positive for depression. Negative for decreased concentration and suicidal ideas. The patient is not nervous/anxious and does not have insomnia.   ? ?Patient Active Problem List  ? Diagnosis Date Noted  ? Benign prostatic hyperplasia with lower urinary tract symptoms 01/12/2020  ? GERD without esophagitis 03/25/2018  ? Moderate episode of recurrent major depressive disorder ([place]) 03/25/2018  ? Acute calcific periarthritis 06/18/2015  ? Allergic rhinitis 06/18/2015  ? Arthritis 06/18/2015  ? Essential (primary) hypertension 06/18/2015  ? 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 HLD (hyperlipidemia) 06/18/2015  ? External hemorrhoid 06/18/2015  ? Gastroenteritis 06/18/2015  ? Bilateral nephrolithiasis 05/15/2014  ? ? ?Allergies  ?Allergen Reactions  ? Bee Venom Anaphylaxis  ? Ciprofloxacin Itching  ? ? ?Past Surgical History:  ?Procedure Laterality Date  ? CARDIAC CATHETERIZATION Left 09/09/2016  ? Procedure: Left Heart Cath and Coronary Angiography;  Surgeon: Yolonda Kida, MD;  Location: [facility];  Service: Cardiovascular;  Laterality: Left;  ? COLONOSCOPY  2013  ? normal/ Dr Vira Agar- cleared for 10 yrs  ? COLONOSCOPY WITH PROPOFOL N/A 01/03/2018  ? Procedure: COLONOSCOPY WITH PROPOFOL;  Surgeon: Manya Silvas, MD;  Location: [facility];  Service: Endoscopy;  Laterality: N/A;  ? CORONARY ARTERY BYPASS GRAFT    ? CYSTOSCOPY W/ RETROGRADES Bilateral 07/11/2020  ? Procedure: CYSTOSCOPY WITH RETROGRADE PYELOGRAM;  Surgeon: Billey Co, MD;  Location: [facility];  Service: Urology;  Laterality: Bilateral;  ?  CYSTOSCOPY W/ RETROGRADES  07/26/2020  ? Procedure: CYSTOSCOPY WITH RETROGRADE PYELOGRAM;  Surgeon: Billey Co, MD;  Location: [facility];  Service: Urology;;  ? CYSTOSCOPY W/ URETERAL STENT REMOVAL Right 07/26/2020  ? Procedure: CYSTOSCOPY WITH STENT REMOVAL;  Surgeon: Billey Co, MD;  Location: [facility];  Service: Urology;  Laterality: Right;  ? CYSTOSCOPY/URETEROSCOPY/HOLMIUM LASER/STENT PLACEMENT Bilateral 07/11/2020  ? Procedure: CYSTOSCOPY/URETEROSCOPY/HOLMIUM LASER/STENT PLACEMENT;  Surgeon: Billey Co, MD;  Location: [facility];  Service: Urology;  Laterality: Bilateral;  ? CYSTOSCOPY/URETEROSCOPY/HOLMIUM LASER/STENT PLACEMENT Left 07/26/2020  ? Procedure: CYSTOSCOPY/URETEROSCOPY/HOLMIUM LASER/STENT EXCHANGE;  Surgeon: Billey Co, MD;  Location: [facility];  Service: Urology;  Laterality: Left;  ? ESOPHAGOGASTRODUODENOSCOPY (EGD) WITH PROPOFOL N/A 01/03/2018  ? Procedure: ESOPHAGOGASTRODUODENOSCOPY (EGD) WITH PROPOFOL;  Surgeon: Manya Silvas, MD;  Location: [facility] ENDOSCOPY;  Service: Endoscopy;  Laterality: N/A;  ? EXTRACORPOREAL SHOCK WAVE LITHOTRIPSY Left 07/11/2020  ? Procedure: EXTRACORPOREAL SHOCK WAVE LITHOTRIPSY (ESWL);  Surgeon: Billey Co, MD;  Location: [facility] OR[facility];  Service: Urology;  Laterality: Left;  ? EYE SURGERY    ? HERNIA REPAIR    ? IR NEPHROSTOMY PLACEMENT RIGHT  03/15/2018  ? kidney stones    ? lithotripsy  ? LITHOTRIPSY    ? NEPHROLITHOTOMY Right 03/15/2018  ? Procedure: NEPHROLITHOTOMY PERCUTANEOUS;  Surgeon: Abbie Sons, MD;  Location: [facility];  Service: Urology;  Laterality:  Right;  ? STONE EXTRACTION WITH BASKET  07/11/2020  ? Procedure: BLADDER STONE EXTRACTION;  Surgeon: Billey Co, MD;  Location: [facility] OR[facility];  Service: Urology;;  ? UPPER GI ENDOSCOPY  2013  ? ? ?Social History  ? ?Tobacco Use  ? Smoking status: Never  ? Smokeless tobacco: Never  ?Vaping Use  ? Vaping Use: Never used  ?Substance Use Topics  ? Alcohol use: No  ?  Alcohol/week: 0.0 standard  drinks  ? Drug use: No  ? ? ? ?Medication list has been reviewed and updated. ? ?Current Meds  ?Medication Sig  ? acetaminophen (TYLENOL) 500 MG tablet Take 1,000 mg by mouth daily as needed for moderate pain.  ? asp

## 2022-03-11 LAB — RENAL FUNCTION PANEL
Albumin: 4.2 g/dL (ref 3.7–4.7)
BUN/Creatinine Ratio: 12 (ref 10–24)
BUN: 14 mg/dL (ref 8–27)
CO2: 23 mmol/L (ref 20–29)
Calcium: 9.6 mg/dL (ref 8.6–10.2)
Chloride: 104 mmol/L (ref 96–106)
Creatinine, Ser: 1.13 mg/dL (ref 0.76–1.27)
Glucose: 101 mg/dL — ABNORMAL HIGH (ref 70–99)
Phosphorus: 3 mg/dL (ref 2.8–4.1)
Potassium: 4.8 mmol/L (ref 3.5–5.2)
Sodium: 144 mmol/L (ref 134–144)
eGFR: 67 mL/min/{1.73_m2} (ref >59)

## 2022-03-11 LAB — LIPID PANEL WITH LDL/HDL RATIO
Cholesterol, Total: 170 mg/dL (ref 100–199)
HDL: 53 mg/dL (ref >39)
LDL Chol Calc (NIH): 96 mg/dL (ref 0–99)
LDL/HDL Ratio: 1.8 ratio (ref 0.0–3.6)
Triglycerides: 118 mg/dL (ref 0–149)
VLDL Cholesterol Cal: 21 mg/dL (ref 5–40)

## 2022-03-17 ENCOUNTER — Ambulatory Visit: Payer: Medicare PPO | Admitting: Family Medicine

## 2022-03-17 ENCOUNTER — Inpatient Hospital Stay (INDEPENDENT_AMBULATORY_CARE_PROVIDER_SITE_OTHER): Payer: Medicare PPO | Admitting: Radiology

## 2022-03-17 ENCOUNTER — Encounter: Payer: Self-pay | Admitting: Family Medicine

## 2022-03-17 VITALS — BP 128/80 | HR 74 | Ht 67.0 in | Wt 203.0 lb

## 2022-03-17 DIAGNOSIS — M1711 Unilateral primary osteoarthritis, right knee: Secondary | ICD-10-CM | POA: Diagnosis not present

## 2022-03-17 MED ORDER — TRIAMCINOLONE ACETONIDE 40 MG/ML IJ SUSP
40.0000 mg | Freq: Once | INTRAMUSCULAR | Status: AC
  Administered 2022-03-17: 40 mg via INTRAMUSCULAR

## 2022-03-17 NOTE — Patient Instructions (Signed)
You have just been given a cortisone injection to reduce pain and inflammation. After the injection you may notice immediate relief of pain as a result of the Lidocaine. It is important to rest the area of the injection for 24 to 48 hours after the injection. There is a possibility of some temporary increased discomfort and swelling for up to 72 hours until the cortisone begins to work. If you do have pain, simply rest the joint and use ice. If you can tolerate over the counter medications, you can try Tylenol, Aleve, or Advil for added relief per package instructions. ?- As above, relative rest x2 days then gradual return normal activity ?- Transition to as needed dosing of meloxicam until symptoms respond to cortisone ?- Start home exercise later this week/early next week and continue x6 weeks ?- We will reach out to you regarding gel injection authorization ?- Contact us for any questions and follow-up as needed ?

## 2022-03-17 NOTE — Progress Notes (Signed)
?  ? ?Primary Care / Sports Medicine Office Visit ? ?Patient Information:  ?Patient ID: Timothy Hatfield, male DOB: 23-Feb-1946 Age: 76 y.o. MRN: [id number]  ? ?Timothy Hatfield is a pleasant 76 y.o. male presenting with the following: ? ?Chief Complaint  ?Patient presents with  ? Knee Pain  ?  Pain off an on for a couple months, no known injury, does have xrays. Did try anti inflammatory with relief. Feels like pressure.   ? ? ?Vitals:  ? 03/17/22 1358  ?BP: 128/80  ?Pulse: 74  ?SpO2: 98%  ? ?Vitals:  ? 03/17/22 1358  ?Weight: 203 lb (92.1 kg)  ?Height: '5\' 7"'$  (1.702 m)  ? ?Body mass index is 31.79 kg/m?. ? ?DG Knee Complete 4 Views Right ? ?Result Date: 03/12/2022 ?CLINICAL DATA:  Right knee pain x 6 months. EXAM: RIGHT KNEE - COMPLETE 4+ VIEW COMPARISON:  None. FINDINGS: No fracture or bone lesion. Knee joint normally spaced and aligned. No degenerative/arthropathic changes. No joint effusion. Soft tissues are unremarkable. IMPRESSION: Negative. Electronically Signed   By: Lajean Manes M.D.   On: 03/12/2022 16:32    ? ?Independent interpretation of notes and tests performed by another provider:  ? ?Independent interpretation of her right knee x-rays reveal moderate medial tibiofemoral and patellofemoral degenerative changes with prominent superior patellar osteophyte/enthesophyte, enthesophyte at the tibial tuberosity, evidence of dynamic maltracking, no acute osseous processes noted ? ?Procedures performed:  ? ?Procedure:  Injection of right knee joint under ultrasound guidance. ?Ultrasound guidance utilized for out of plane anteromedial injection to the right knee, no effusion noted ?Samsung HS60 device utilized with permanent recording / reporting. ?Verbal informed consent obtained and verified. ?Skin prepped in a sterile fashion. ?Ethyl chloride for topical local analgesia.  ?Completed without difficulty and tolerated well. ?Medication: triamcinolone acetonide 40 mg/mL suspension for injection 1 mL total and  2 mL lidocaine 1% without epinephrine utilized for needle placement anesthetic ?Advised to contact for fevers/chills, erythema, induration, drainage, or persistent bleeding. ? ? ?Pertinent History, Exam, Impression, and Recommendations:  ? ?Problem List Items Addressed This Visit   ? ?  ? Musculoskeletal and Integument  ? Primary osteoarthritis of one knee, right - Primary  ?  Patient with roughly 52-monthhistory of atraumatic anteromedial right knee pain associated with swelling, pain with ascending/descending ladders, prolonged time on feet.  His PCP Dr. DOtilio Miudid place him on meloxicam which did provide improvement though not resolution of symptoms.  He did note worsening of symptoms during the short interim of being off of the medication prior to refill.  Examination today reveals mild generalized knee swelling without effusion when compared to contralateral, tenderness at the inferomedial patellar facet, nontender at the joint lines otherwise, pain during maximal flexion, his range of motion is 0-1 10 limited by pain/swelling, equivocal medial McMurray, otherwise no laxity with anterior/posterior or valgus/varus stressing. ? ?I reviewed patient's x-rays and his examination findings in light of his history and his symptoms are primarily secondary to the patellofemoral articulation.  I have advised that as he is still symptomatic despite meloxicam, he can transition to alternate NSAID versus cortisone injection.  Patient has elected to proceed with intra-articular right knee cortisone injection, from a medication management standpoint I have advised him to wean and discontinue meloxicam reserving this for as needed dosing.  Additionally, he is to start home-based exercises to stabilize the knee.  We will seek authorization for viscosupplementation and have him return to have medication placed if authorized.  He can otherwise follow-up as needed. ? ?Chronic condition, symptomatic, independent interpretation  x-rays, medication management ? ?  ?  ? Relevant Orders  ? [place] LIMITED JOINT SPACE STRUCTURES LOW RIGHT  ?  ? ?Orders & Medications ?Meds ordered this encounter  ?Medications  ? triamcinolone acetonide (KENALOG-40) injection 40 mg  ? ?Orders Placed This Encounter  ?Procedures  ? [place] LIMITED JOINT SPACE STRUCTURES LOW RIGHT  ?  ? ?Return if symptoms worsen or fail to improve.  ?  ? ?Montel Culver, MD ? ? Primary Care Sports Medicine ?[facility] ?[place]  ? ?

## 2022-03-17 NOTE — Assessment & Plan Note (Signed)
Patient with roughly 52-monthhistory of atraumatic anteromedial right knee pain associated with swelling, pain with ascending/descending ladders, prolonged time on feet.  His PCP Dr. DOtilio Miudid place him on meloxicam which did provide improvement though not resolution of symptoms.  He did note worsening of symptoms during the short interim of being off of the medication prior to refill.  Examination today reveals mild generalized knee swelling without effusion when compared to contralateral, tenderness at the inferomedial patellar facet, nontender at the joint lines otherwise, pain during maximal flexion, his range of motion is 0-1 10 limited by pain/swelling, equivocal medial McMurray, otherwise no laxity with anterior/posterior or valgus/varus stressing. ? ?I reviewed patient's x-rays and his examination findings in light of his history and his symptoms are primarily secondary to the patellofemoral articulation.  I have advised that as he is still symptomatic despite meloxicam, he can transition to alternate NSAID versus cortisone injection.  Patient has elected to proceed with intra-articular right knee cortisone injection, from a medication management standpoint I have advised him to wean and discontinue meloxicam reserving this for as needed dosing.  Additionally, he is to start home-based exercises to stabilize the knee.  We will seek authorization for viscosupplementation and have him return to have medication placed if authorized.  He can otherwise follow-up as needed. ? ?Chronic condition, symptomatic, independent interpretation x-rays, medication management ?

## 2022-03-19 ENCOUNTER — Ambulatory Visit: Payer: Medicare PPO | Admitting: Urology

## 2022-03-19 ENCOUNTER — Ambulatory Visit
Admission: RE | Admit: 2022-03-19 | Discharge: 2022-03-19 | Disposition: A | Discharge status: Home / Self Care | Payer: Medicare PPO | Source: Ambulatory Visit | Attending: Urology | Admitting: Urology

## 2022-03-19 ENCOUNTER — Ambulatory Visit
Admission: RE | Admit: 2022-03-19 | Discharge: 2022-03-19 | Disposition: A | Discharge status: Home / Self Care | Payer: Medicare PPO | Attending: Urology | Admitting: Urology

## 2022-03-19 ENCOUNTER — Encounter: Payer: Self-pay | Admitting: Urology

## 2022-03-19 VITALS — BP 163/77 | HR 66 | Ht 67.0 in | Wt 198.0 lb

## 2022-03-19 DIAGNOSIS — N401 Enlarged prostate with lower urinary tract symptoms: Secondary | ICD-10-CM | POA: Diagnosis not present

## 2022-03-19 DIAGNOSIS — N200 Calculus of kidney: Secondary | ICD-10-CM | POA: Insufficient documentation

## 2022-03-19 DIAGNOSIS — N138 Other obstructive and reflux uropathy: Secondary | ICD-10-CM | POA: Diagnosis not present

## 2022-03-19 LAB — BLADDER SCAN AMB NON-IMAGING

## 2022-03-19 MED ORDER — DUTASTERIDE-TAMSULOSIN HCL 0.5-0.4 MG PO CAPS: 1.0000 | ORAL_CAPSULE | Freq: Every day | ORAL | 3 refills | Status: DC

## 2022-03-19 NOTE — Progress Notes (Signed)
? ?  03/19/2022 ?1:10 PM  ? ?Timothy Hatfield ?06/05/1946 ?[id number] ? ?Reason for visit: Follow up nephrolithiasis, BPH ? ?HPI: ?I saw Mr. Hattabaugh in clinic for follow-up of the above issues.  He is a 76 year old male who underwent staged bilateral ureteroscopy in August and September 2021 for significant bilateral stone burden. He has had multiple 24-hour urines previously that showed low urine volume, and he deferred medication like potassium citrate or a repeat 24-hour urine.  Stone analysis that showed 50% calcium oxalate and 50% uric acid previously.  He thinks he passed about 2 stones in the last year without significant problems. ? ?I personally reviewed his KUB today that shows some stable right-sided mid pole stones, less than 4 to 5 mm each. ? ?We discussed general stone prevention strategies including adequate hydration with goal of producing 2.5 L of urine daily, increasing citric acid intake, increasing calcium intake during high oxalate meals, minimizing animal protein, and decreasing salt intake. Information about dietary recommendations given today.  ? ?Regarding his BPH, symptoms are well controlled on dutasteride-tamsulosin.  He denies any incontinence, and is currently satisfied with his urinary symptoms.  Prostate measures 50 g on CT from July 2021.  I personally reviewed and interpreted those films.  He has some occasional urgency and frequency, but this seems to be more when he is just sitting at home watching TV, and he does well when he is out of the house.  He is not at a point where he is interested in other medications.  IPSS score today is 6, with quality of life mostly satisfied, and PVR is normal at 18 mL. ? ?Continue dutasteride-tamsulosin, refilled ?Stone prevention strategies discussed-> consider potassium citrate in the future if recurrent stones ?RTC 1 year with KUB prior, PVR ? ?Billey Co, MD ? ?[place] ?[address], [address] ?[place], [place] [address] ?[phone] ? ? ?

## 2022-05-11 DIAGNOSIS — I2510 Atherosclerotic heart disease of native coronary artery without angina pectoris: Secondary | ICD-10-CM | POA: Diagnosis not present

## 2022-05-11 DIAGNOSIS — E6609 Other obesity due to excess calories: Secondary | ICD-10-CM | POA: Diagnosis not present

## 2022-05-11 DIAGNOSIS — G720 Drug-induced myopathy: Secondary | ICD-10-CM | POA: Diagnosis not present

## 2022-05-11 DIAGNOSIS — I10 Essential (primary) hypertension: Secondary | ICD-10-CM | POA: Diagnosis not present

## 2022-05-11 DIAGNOSIS — K219 Gastro-esophageal reflux disease without esophagitis: Secondary | ICD-10-CM | POA: Diagnosis not present

## 2022-05-11 DIAGNOSIS — I25118 Atherosclerotic heart disease of native coronary artery with other forms of angina pectoris: Secondary | ICD-10-CM | POA: Diagnosis not present

## 2022-05-11 DIAGNOSIS — G4733 Obstructive sleep apnea (adult) (pediatric): Secondary | ICD-10-CM | POA: Diagnosis not present

## 2022-05-11 DIAGNOSIS — E782 Mixed hyperlipidemia: Secondary | ICD-10-CM | POA: Diagnosis not present

## 2022-05-11 DIAGNOSIS — Z951 Presence of aortocoronary bypass graft: Secondary | ICD-10-CM | POA: Diagnosis not present

## 2022-06-12 ENCOUNTER — Other Ambulatory Visit: Payer: Self-pay | Admitting: Family Medicine

## 2022-06-12 DIAGNOSIS — I10 Essential (primary) hypertension: Secondary | ICD-10-CM

## 2022-06-12 NOTE — Telephone Encounter (Signed)
rx was sent to pharmacy on 03/10/22 #90/1 RF Requested Prescriptions  Pending Prescriptions Disp Refills  . metoprolol succinate (TOPROL-XL) 25 MG 24 hr tablet [Pharmacy Med Name: METOPROLOL SUCCINATE ER 25 MG TAB] 90 tablet 1    Sig: TAKE 1 TABLET BY MOUTH DAILY     Cardiovascular:  Beta Blockers Failed - 06/12/2022  1:13 PM      Failed - Last BP in normal range    BP Readings from Last 1 Encounters:  03/19/22 (!) 163/77         Passed - Last Heart Rate in normal range    Pulse Readings from Last 1 Encounters:  03/19/22 66         Passed - Valid encounter within last 6 months    Recent Outpatient Visits          2 months ago Primary osteoarthritis of one knee, right   [facility] Montel Culver, MD   3 months ago Essential (primary) hypertension   [facility] Juline Patch, MD   9 months ago Essential (primary) hypertension   [facility] Juline Patch, MD   9 months ago [place], MD   11 months ago Primary osteoarthritis of left knee   [facility] Juline Patch, MD      Future Appointments            In 3 months Juline Patch, MD [facility], [place]   In 9 months [facility], Herbert Seta, [place]

## 2022-07-01 DIAGNOSIS — Z0100 Encounter for examination of eyes and vision without abnormal findings: Secondary | ICD-10-CM | POA: Diagnosis not present

## 2022-07-01 DIAGNOSIS — H26491 Other secondary cataract, right eye: Secondary | ICD-10-CM | POA: Diagnosis not present

## 2022-07-06 DIAGNOSIS — H26491 Other secondary cataract, right eye: Secondary | ICD-10-CM | POA: Diagnosis not present

## 2022-07-14 ENCOUNTER — Inpatient Hospital Stay: Payer: Self-pay | Admitting: Radiology

## 2022-07-14 ENCOUNTER — Ambulatory Visit: Payer: Medicare PPO | Admitting: Family Medicine

## 2022-07-14 ENCOUNTER — Encounter: Payer: Self-pay | Admitting: Family Medicine

## 2022-07-14 VITALS — BP 118/80 | HR 76 | Ht 67.0 in | Wt 205.8 lb

## 2022-07-14 DIAGNOSIS — M1711 Unilateral primary osteoarthritis, right knee: Secondary | ICD-10-CM | POA: Diagnosis not present

## 2022-07-14 MED ORDER — TRIAMCINOLONE ACETONIDE 40 MG/ML IJ SUSP
40.0000 mg | Freq: Once | INTRAMUSCULAR | Status: AC
  Administered 2022-07-14: 40 mg via INTRAMUSCULAR

## 2022-07-14 NOTE — Progress Notes (Signed)
     Primary Care / Sports Medicine Office Visit  Patient Information:  Patient ID: Timothy Hatfield, male DOB: 1946/04/14 Age: 76 y.o. MRN: [id number]   [name] is a pleasant 76 y.o. male presenting with the following:  Chief Complaint  Patient presents with   Primary osteoarthritis of one knee, right    Pt here to have an injection in right knee, stated it started to hurt about 5 weeks ago, is going up and down stairs often.     Vitals:   07/14/22 1534  BP: 118/80  Pulse: 76  SpO2: 98%   Vitals:   07/14/22 1534  Weight: 205 lb 12.8 oz (93.4 kg)  Height: '5\' 7"'$  (1.702 m)   Body mass index is 32.23 kg/m.  No results found.   Independent interpretation of notes and tests performed by another provider:   None  Procedures performed:   Procedure:  Injection of right knee under ultrasound guidance. Ultrasound guidance utilized for anteromedial approach to right knee, no effusion noted Samsung HS60 device utilized with permanent recording / reporting. Verbal informed consent obtained and verified. Skin prepped in a sterile fashion. Ethyl chloride for topical local analgesia.  Completed without difficulty and tolerated well. Medication: triamcinolone acetonide 40 mg/mL suspension for injection 1 mL total and 2 mL lidocaine 1% without epinephrine utilized for needle placement anesthetic Advised to contact for fevers/chills, erythema, induration, drainage, or persistent bleeding.   Pertinent History, Exam, Impression, and Recommendations:   Problem List Items Addressed This Visit       Musculoskeletal and Integument   Primary osteoarthritis of one knee, right - Primary    1 month history of right anteromedial knee pain, of note received cortisone 4 months prior. No trauma but has been having to increase ascending/descending stairs due to helping his wife with medical issues at home. We reviewed treatments and he elected to proceed with ultrasound-guided  intraarticular cortisone injection. I did advise him to dose the meloxicam PRN until symptoms respond to cortisone. We will also seek authorization for viscosupplementation.       Relevant Orders   [place] LIMITED JOINT SPACE STRUCTURES LOW RIGHT     Orders & Medications Meds ordered this encounter  Medications   triamcinolone acetonide (KENALOG-40) injection 40 mg   Orders Placed This Encounter  Procedures   [place] LIMITED JOINT SPACE STRUCTURES LOW RIGHT     Return if symptoms worsen or fail to improve.     Montel Culver, MD   Primary Care Sports [facility] [place]

## 2022-07-14 NOTE — Patient Instructions (Addendum)
You have just been given a cortisone injection to reduce pain and inflammation. After the injection you may notice immediate relief of pain as a result of the Lidocaine. It is important to rest the area of the injection for 24 to 48 hours after the injection. There is a possibility of some temporary increased discomfort and swelling for up to 72 hours until the cortisone begins to work. If you do have pain, simply rest the joint and use ice. If you can tolerate over the counter medications, you can try Tylenol, Aleve, or Advil for added relief per package instructions. - Relative rest x 2 days - We will reach out to seek authorization for gel injections - Can dose meloxicam as-needed until symptoms respond to injection - Can repeat injections in 3 months

## 2022-07-14 NOTE — Assessment & Plan Note (Signed)
1 month history of right anteromedial knee pain, of note received cortisone 4 months prior. No trauma but has been having to increase ascending/descending stairs due to helping his wife with medical issues at home. We reviewed treatments and he elected to proceed with ultrasound-guided intraarticular cortisone injection. I did advise him to dose the meloxicam PRN until symptoms respond to cortisone. We will also seek authorization for viscosupplementation.

## 2022-07-16 DIAGNOSIS — Z7982 Long term (current) use of aspirin: Secondary | ICD-10-CM | POA: Diagnosis not present

## 2022-07-16 DIAGNOSIS — G9341 Metabolic encephalopathy: Secondary | ICD-10-CM | POA: Diagnosis not present

## 2022-07-16 DIAGNOSIS — I10 Essential (primary) hypertension: Secondary | ICD-10-CM | POA: Diagnosis not present

## 2022-07-16 DIAGNOSIS — R319 Hematuria, unspecified: Secondary | ICD-10-CM | POA: Diagnosis not present

## 2022-07-16 DIAGNOSIS — N401 Enlarged prostate with lower urinary tract symptoms: Secondary | ICD-10-CM | POA: Diagnosis not present

## 2022-07-16 DIAGNOSIS — R300 Dysuria: Secondary | ICD-10-CM | POA: Diagnosis not present

## 2022-07-16 DIAGNOSIS — E7800 Pure hypercholesterolemia, unspecified: Secondary | ICD-10-CM | POA: Diagnosis not present

## 2022-07-16 DIAGNOSIS — E8770 Fluid overload, unspecified: Secondary | ICD-10-CM | POA: Diagnosis not present

## 2022-07-16 DIAGNOSIS — R3989 Other symptoms and signs involving the genitourinary system: Secondary | ICD-10-CM | POA: Diagnosis not present

## 2022-07-16 DIAGNOSIS — Z79899 Other long term (current) drug therapy: Secondary | ICD-10-CM | POA: Diagnosis not present

## 2022-07-16 DIAGNOSIS — R410 Disorientation, unspecified: Secondary | ICD-10-CM | POA: Diagnosis not present

## 2022-07-16 DIAGNOSIS — N390 Urinary tract infection, site not specified: Secondary | ICD-10-CM | POA: Diagnosis not present

## 2022-07-16 DIAGNOSIS — N3001 Acute cystitis with hematuria: Secondary | ICD-10-CM | POA: Diagnosis not present

## 2022-07-16 DIAGNOSIS — N1831 Chronic kidney disease, stage 3a: Secondary | ICD-10-CM | POA: Diagnosis not present

## 2022-07-16 DIAGNOSIS — R4182 Altered mental status, unspecified: Secondary | ICD-10-CM | POA: Diagnosis not present

## 2022-07-16 DIAGNOSIS — E873 Alkalosis: Secondary | ICD-10-CM | POA: Diagnosis not present

## 2022-07-16 DIAGNOSIS — Z9981 Dependence on supplemental oxygen: Secondary | ICD-10-CM | POA: Diagnosis not present

## 2022-07-16 DIAGNOSIS — N200 Calculus of kidney: Secondary | ICD-10-CM | POA: Diagnosis not present

## 2022-07-16 DIAGNOSIS — Z87442 Personal history of urinary calculi: Secondary | ICD-10-CM | POA: Diagnosis not present

## 2022-07-16 DIAGNOSIS — R0902 Hypoxemia: Secondary | ICD-10-CM | POA: Diagnosis not present

## 2022-07-16 DIAGNOSIS — D72829 Elevated white blood cell count, unspecified: Secondary | ICD-10-CM | POA: Diagnosis not present

## 2022-07-16 DIAGNOSIS — J9811 Atelectasis: Secondary | ICD-10-CM | POA: Diagnosis not present

## 2022-07-16 DIAGNOSIS — A419 Sepsis, unspecified organism: Secondary | ICD-10-CM | POA: Diagnosis not present

## 2022-07-16 DIAGNOSIS — I129 Hypertensive chronic kidney disease with stage 1 through stage 4 chronic kidney disease, or unspecified chronic kidney disease: Secondary | ICD-10-CM | POA: Diagnosis not present

## 2022-07-17 DIAGNOSIS — N200 Calculus of kidney: Secondary | ICD-10-CM | POA: Diagnosis not present

## 2022-07-17 DIAGNOSIS — R6520 Severe sepsis without septic shock: Secondary | ICD-10-CM | POA: Diagnosis not present

## 2022-07-17 DIAGNOSIS — Z9981 Dependence on supplemental oxygen: Secondary | ICD-10-CM | POA: Diagnosis not present

## 2022-07-17 DIAGNOSIS — N1831 Chronic kidney disease, stage 3a: Secondary | ICD-10-CM | POA: Insufficient documentation

## 2022-07-17 DIAGNOSIS — K5730 Diverticulosis of large intestine without perforation or abscess without bleeding: Secondary | ICD-10-CM | POA: Diagnosis not present

## 2022-07-17 DIAGNOSIS — R339 Retention of urine, unspecified: Secondary | ICD-10-CM | POA: Diagnosis not present

## 2022-07-17 DIAGNOSIS — G9340 Encephalopathy, unspecified: Secondary | ICD-10-CM | POA: Diagnosis not present

## 2022-07-17 DIAGNOSIS — E873 Alkalosis: Secondary | ICD-10-CM | POA: Insufficient documentation

## 2022-07-17 DIAGNOSIS — G4733 Obstructive sleep apnea (adult) (pediatric): Secondary | ICD-10-CM | POA: Insufficient documentation

## 2022-07-17 DIAGNOSIS — R600 Localized edema: Secondary | ICD-10-CM | POA: Diagnosis not present

## 2022-07-17 DIAGNOSIS — N3001 Acute cystitis with hematuria: Secondary | ICD-10-CM | POA: Insufficient documentation

## 2022-07-17 DIAGNOSIS — J9811 Atelectasis: Secondary | ICD-10-CM | POA: Diagnosis not present

## 2022-07-17 DIAGNOSIS — E876 Hypokalemia: Secondary | ICD-10-CM | POA: Insufficient documentation

## 2022-07-17 DIAGNOSIS — A419 Sepsis, unspecified organism: Secondary | ICD-10-CM | POA: Diagnosis not present

## 2022-07-17 DIAGNOSIS — R9341 Abnormal radiologic findings on diagnostic imaging of renal pelvis, ureter, or bladder: Secondary | ICD-10-CM | POA: Diagnosis not present

## 2022-07-17 DIAGNOSIS — I272 Pulmonary hypertension, unspecified: Secondary | ICD-10-CM

## 2022-07-17 DIAGNOSIS — R410 Disorientation, unspecified: Secondary | ICD-10-CM | POA: Diagnosis not present

## 2022-07-17 DIAGNOSIS — E7800 Pure hypercholesterolemia, unspecified: Secondary | ICD-10-CM | POA: Diagnosis not present

## 2022-07-17 DIAGNOSIS — N400 Enlarged prostate without lower urinary tract symptoms: Secondary | ICD-10-CM | POA: Diagnosis not present

## 2022-07-17 DIAGNOSIS — N390 Urinary tract infection, site not specified: Secondary | ICD-10-CM | POA: Insufficient documentation

## 2022-07-17 DIAGNOSIS — R319 Hematuria, unspecified: Secondary | ICD-10-CM | POA: Diagnosis not present

## 2022-07-17 DIAGNOSIS — G9341 Metabolic encephalopathy: Secondary | ICD-10-CM | POA: Diagnosis not present

## 2022-07-17 DIAGNOSIS — Z79899 Other long term (current) drug therapy: Secondary | ICD-10-CM | POA: Diagnosis not present

## 2022-07-17 DIAGNOSIS — I10 Essential (primary) hypertension: Secondary | ICD-10-CM | POA: Diagnosis not present

## 2022-07-17 DIAGNOSIS — I129 Hypertensive chronic kidney disease with stage 1 through stage 4 chronic kidney disease, or unspecified chronic kidney disease: Secondary | ICD-10-CM | POA: Diagnosis not present

## 2022-07-17 HISTORY — DX: Pulmonary hypertension, unspecified: I27.20

## 2022-07-18 DIAGNOSIS — A419 Sepsis, unspecified organism: Secondary | ICD-10-CM | POA: Diagnosis not present

## 2022-07-18 DIAGNOSIS — N1831 Chronic kidney disease, stage 3a: Secondary | ICD-10-CM | POA: Diagnosis not present

## 2022-07-18 DIAGNOSIS — E873 Alkalosis: Secondary | ICD-10-CM | POA: Diagnosis not present

## 2022-07-18 DIAGNOSIS — N3001 Acute cystitis with hematuria: Secondary | ICD-10-CM | POA: Diagnosis not present

## 2022-07-18 DIAGNOSIS — G9341 Metabolic encephalopathy: Secondary | ICD-10-CM | POA: Diagnosis not present

## 2022-07-18 DIAGNOSIS — E7800 Pure hypercholesterolemia, unspecified: Secondary | ICD-10-CM | POA: Diagnosis not present

## 2022-07-18 DIAGNOSIS — J9811 Atelectasis: Secondary | ICD-10-CM | POA: Diagnosis not present

## 2022-07-18 DIAGNOSIS — G9340 Encephalopathy, unspecified: Secondary | ICD-10-CM | POA: Diagnosis not present

## 2022-07-18 DIAGNOSIS — Z9981 Dependence on supplemental oxygen: Secondary | ICD-10-CM | POA: Diagnosis not present

## 2022-07-18 DIAGNOSIS — I129 Hypertensive chronic kidney disease with stage 1 through stage 4 chronic kidney disease, or unspecified chronic kidney disease: Secondary | ICD-10-CM | POA: Diagnosis not present

## 2022-07-18 DIAGNOSIS — R6520 Severe sepsis without septic shock: Secondary | ICD-10-CM | POA: Diagnosis not present

## 2022-07-20 ENCOUNTER — Telehealth: Payer: Self-pay | Admitting: *Deleted

## 2022-07-20 NOTE — Patient Outreach (Signed)
  Care Coordination TOC Note Transition Care Management Unsuccessful Follow-up Telephone Call  Date of discharge and from where:  07/18/22 [facility]  Attempts:  2nd Attempt  Reason for unsuccessful TCM follow-up call:  Left voice message.  Emelia Loron RN, BSN [place] [phone] [email or web address] [email or web address]

## 2022-07-20 NOTE — Patient Outreach (Signed)
  Care Coordination TOC Note Transition Care Management Unsuccessful Follow-up Telephone Call  Date of discharge and from where:  07/18/22 [facility]  Attempts:  1st Attempt  Reason for unsuccessful TCM follow-up call:  Left voice message.  Emelia Loron RN, BSN [place] [phone] [email or web address]

## 2022-07-21 ENCOUNTER — Telehealth: Payer: Self-pay | Admitting: *Deleted

## 2022-07-21 NOTE — Patient Outreach (Signed)
  Care Coordination TOC Note Transition Care Management Unsuccessful Follow-up Telephone Call  Date of discharge and from where: 07/18/22 [facility]  Attempts:  3rd Attempt  Reason for unsuccessful TCM follow-up call:  Left voice message.  Emelia Loron RN, BSN [place] [phone] [email or web address]

## 2022-07-24 ENCOUNTER — Ambulatory Visit: Payer: Medicare PPO | Admitting: Family Medicine

## 2022-07-24 ENCOUNTER — Encounter: Payer: Self-pay | Admitting: Family Medicine

## 2022-07-24 VITALS — BP 144/86 | HR 100 | Ht 67.0 in | Wt 202.0 lb

## 2022-07-24 DIAGNOSIS — E860 Dehydration: Secondary | ICD-10-CM

## 2022-07-24 DIAGNOSIS — I10 Essential (primary) hypertension: Secondary | ICD-10-CM | POA: Diagnosis not present

## 2022-07-24 DIAGNOSIS — Z8619 Personal history of other infectious and parasitic diseases: Secondary | ICD-10-CM

## 2022-07-24 DIAGNOSIS — N3001 Acute cystitis with hematuria: Secondary | ICD-10-CM | POA: Diagnosis not present

## 2022-07-24 DIAGNOSIS — R7989 Other specified abnormal findings of blood chemistry: Secondary | ICD-10-CM

## 2022-07-24 LAB — POCT URINALYSIS DIPSTICK
Bilirubin, UA: NEGATIVE
Glucose, UA: NEGATIVE
Ketones, UA: NEGATIVE
Leukocytes, UA: NEGATIVE
Nitrite, UA: NEGATIVE
Protein, UA: NEGATIVE
Spec Grav, UA: 1.025 (ref 1.010–1.025)
Urobilinogen, UA: 0.2 E.U./dL
pH, UA: 6 (ref 5.0–8.0)

## 2022-07-24 MED ORDER — BENAZEPRIL HCL 10 MG PO TABS: 10.0000 mg | ORAL_TABLET | Freq: Every day | ORAL | 3 refills | Status: DC

## 2022-07-24 NOTE — Progress Notes (Unsigned)
Date:  07/24/2022   Name:  Timothy Hatfield   DOB:  1946-10-27   MRN:  [id number]   Chief Complaint: Urinary Tract Infection (Had to stop benazepril- HCTZ while taking levofloxacin)  Urinary Tract Infection  This is a new problem. The problem has been gradually improving. The quality of the pain is described as burning. The pain is moderate. There has been no fever. Pertinent negatives include no chills, discharge, flank pain, frequency, hematuria, hesitancy, nausea, sweats, urgency or vomiting. He has tried antibiotics for the symptoms. The treatment provided moderate relief.    Lab Results  Component Value Date   NA 144 03/10/2022   K 4.8 03/10/2022   CO2 23 03/10/2022   GLUCOSE 101 (H) 03/10/2022   BUN 14 03/10/2022   CREATININE 1.13 03/10/2022   CALCIUM 9.6 03/10/2022   EGFR 67 03/10/2022   GFRNONAA 42 (L) 07/24/2020   Lab Results  Component Value Date   CHOL 170 03/10/2022   HDL 53 03/10/2022   LDLCALC 96 03/10/2022   TRIG 118 03/10/2022   CHOLHDL 2.6 10/03/2018   No results found for: "TSH" No results found for: "HGBA1C" Lab Results  Component Value Date   WBC 7.0 07/24/2020   HGB 14.6 07/24/2020   HCT 42.7 07/24/2020   MCV 94.7 07/24/2020   PLT 261 07/24/2020   Lab Results  Component Value Date   ALT 24 03/27/2020   AST 25 03/27/2020   ALKPHOS 64 03/27/2020   BILITOT 0.9 03/27/2020   No results found for: "25OHVITD2", "25OHVITD3", "VD25OH"   Review of Systems  Constitutional:  Negative for chills and fever.  HENT:  Negative for drooling, ear discharge, ear pain and sore throat.   Respiratory:  Negative for cough, shortness of breath and wheezing.   Cardiovascular:  Negative for chest pain, palpitations and leg swelling.  Gastrointestinal:  Negative for abdominal pain, blood in stool, constipation, diarrhea, nausea and vomiting.  Endocrine: Negative for polydipsia.  Genitourinary:  Negative for dysuria, flank pain, frequency, hematuria, hesitancy and  urgency.  Musculoskeletal:  Negative for back pain, myalgias and neck pain.  Skin:  Negative for rash.  Allergic/Immunologic: Negative for environmental allergies.  Neurological:  Negative for dizziness and headaches.  Hematological:  Does not bruise/bleed easily.  Psychiatric/Behavioral:  Negative for suicidal ideas. The patient is not nervous/anxious.     Patient Active Problem List   Diagnosis Date Noted   Primary osteoarthritis of one knee, right 03/17/2022   Benign prostatic hyperplasia with lower urinary tract symptoms 01/12/2020   GERD without esophagitis 03/25/2018   Moderate episode of recurrent major depressive disorder ([place]) 03/25/2018   Acute calcific periarthritis 06/18/2015   Allergic rhinitis 06/18/2015   Arthritis 06/18/2015   Essential (primary) hypertension 06/18/2015   Barrett esophagus 06/18/2015   HLD (hyperlipidemia) 06/18/2015   External hemorrhoid 06/18/2015   Gastroenteritis 06/18/2015   Bilateral nephrolithiasis 05/15/2014    Allergies  Allergen Reactions   Bee Venom Anaphylaxis   Ciprofloxacin Itching    Past Surgical History:  Procedure Laterality Date   CARDIAC CATHETERIZATION Left 09/09/2016   Procedure: Left Heart Cath and Coronary Angiography;  Surgeon: Yolonda Kida, MD;  Location: [place] [facility];  Service: Cardiovascular;  Laterality: Left;   COLONOSCOPY  2013   normal/ Dr Vira Agar- cleared for 10 yrs   COLONOSCOPY WITH PROPOFOL N/A 01/03/2018   Procedure: COLONOSCOPY WITH PROPOFOL;  Surgeon: Manya Silvas, MD;  Location: [facility];  Service: Endoscopy;  Laterality: N/A;  CORONARY ARTERY BYPASS GRAFT     CYSTOSCOPY W/ RETROGRADES Bilateral 07/11/2020   Procedure: CYSTOSCOPY WITH RETROGRADE PYELOGRAM;  Surgeon: Billey Co, MD;  Location: [facility];  Service: Urology;  Laterality: Bilateral;   CYSTOSCOPY W/ RETROGRADES  07/26/2020   Procedure: CYSTOSCOPY WITH RETROGRADE PYELOGRAM;  Surgeon: Billey Co, MD;   Location: [facility];  Service: Urology;;   CYSTOSCOPY W/ URETERAL STENT REMOVAL Right 07/26/2020   Procedure: CYSTOSCOPY WITH STENT REMOVAL;  Surgeon: Billey Co, MD;  Location: [facility];  Service: Urology;  Laterality: Right;   CYSTOSCOPY/URETEROSCOPY/HOLMIUM LASER/STENT PLACEMENT Bilateral 07/11/2020   Procedure: CYSTOSCOPY/URETEROSCOPY/HOLMIUM LASER/STENT PLACEMENT;  Surgeon: Billey Co, MD;  Location: [facility];  Service: Urology;  Laterality: Bilateral;   CYSTOSCOPY/URETEROSCOPY/HOLMIUM LASER/STENT PLACEMENT Left 07/26/2020   Procedure: CYSTOSCOPY/URETEROSCOPY/HOLMIUM LASER/STENT EXCHANGE;  Surgeon: Billey Co, MD;  Location: [facility];  Service: Urology;  Laterality: Left;   ESOPHAGOGASTRODUODENOSCOPY (EGD) WITH PROPOFOL N/A 01/03/2018   Procedure: ESOPHAGOGASTRODUODENOSCOPY (EGD) WITH PROPOFOL;  Surgeon: Manya Silvas, MD;  Location[facility];  Service: Endoscopy;  Laterality: N/A;   EXTRACORPOREAL SHOCK WAVE LITHOTRIPSY Left 07/11/2020   Procedure: EXTRACORPOREAL SHOCK WAVE LITHOTRIPSY (ESWL);  Surgeon: Billey Co, MD;  Location: [facility];  Service: Urology;  Laterality: Left;   EYE SURGERY     HERNIA REPAIR     IR NEPHROSTOMY PLACEMENT RIGHT  03/15/2018   kidney stones     lithotripsy   LITHOTRIPSY     NEPHROLITHOTOMY Right 03/15/2018   Procedure: NEPHROLITHOTOMY PERCUTANEOUS;  Surgeon: Abbie Sons, MD;  Location: [facility];  Service: Urology;  Laterality: Right;   STONE EXTRACTION WITH BASKET  07/11/2020   Procedure: BLADDER STONE EXTRACTION;  Surgeon: Billey Co, MD;  Location: [facility];  Service: Urology;;   UPPER GI ENDOSCOPY  2013    Social History   Tobacco Use   Smoking status: Never    Passive exposure: Never   Smokeless tobacco: Never  Vaping Use   Vaping Use: Never used  Substance Use Topics   Alcohol use: No    Alcohol/week: 0.0 standard drinks of alcohol   Drug use: No     Medication list has been reviewed and  updated.  Current Meds  Medication Sig   acetaminophen (TYLENOL) 500 MG tablet Take 1,000 mg by mouth daily as needed for moderate pain.   aspirin EC 81 MG tablet Take 81 mg by mouth daily.   Coenzyme Q10 (COQ-10) 100 MG CAPS Take 100 mg by mouth at bedtime.   Dutasteride-Tamsulosin HCl 0.5-0.4 MG CAPS Take 1 capsule by mouth daily.   EPINEPHrine 0.3 mg/0.3 mL IJ SOAJ injection Inject 0.3 mg into the muscle as needed for anaphylaxis.   loratadine (CLARITIN) 10 MG tablet Take 1 tablet (10 mg total) by mouth daily. For allergies   meloxicam (MOBIC) 15 MG tablet Take 1 tablet (15 mg total) by mouth daily.   metoprolol succinate (TOPROL-XL) 25 MG 24 hr tablet Take 1 tablet (25 mg total) by mouth daily.   Multiple Vitamin (MULTIVITAMIN WITH MINERALS) TABS tablet Take 1 tablet by mouth at bedtime.   mupirocin ointment (BACTROBAN) 2 % Apply 1 application topically 2 (two) times daily.   nitroGLYCERIN (NITROSTAT) 0.4 MG SL tablet Place 1 tablet (0.4 mg total) under the tongue every 5 (five) minutes as needed for chest pain.   pantoprazole (PROTONIX) 40 MG tablet Take 1 tablet (40 mg total) by mouth daily.   rosuvastatin (CRESTOR) 5 MG tablet Take 5 mg  by mouth daily.   sertraline (ZOLOFT) 50 MG tablet Take 1 tablet (50 mg total) by mouth daily. One tablet daily   triamcinolone (NASACORT) 55 MCG/ACT AERO nasal inhaler Place 2 sprays into the nose daily. otc       07/24/2022    3:31 PM 07/14/2022    3:36 PM 03/17/2022    2:06 PM 03/10/2022    2:56 PM  GAD 7 : Generalized Anxiety Score  Nervous, Anxious, on Edge 0 0 0 0  Control/stop worrying 0 0 0 0  Worry too much - different things 0 0 0 0  Trouble relaxing 0 0 0 0  Restless 0 0 0 0  Easily annoyed or irritable 0 0 0 1  Afraid - awful might happen 0 0 0 0  Total GAD 7 Score 0 0 0 1  Anxiety Difficulty Not difficult at all Not difficult at all Not difficult at all Not difficult at all       07/24/2022    3:31 PM 07/14/2022    3:35 PM  03/17/2022    2:05 PM  Depression screen PHQ 2/9  Decreased Interest 0 0 0  Down, Depressed, Hopeless 0 0 0  PHQ - 2 Score 0 0 0  Altered sleeping 0 0 0  Tired, decreased energy 0 1 0  Change in appetite 0 1 0  Feeling bad or failure about yourself  0 0 0  Trouble concentrating 0 0 0  Moving slowly or fidgety/restless 0 0 0  Suicidal thoughts 0 0 0  PHQ-9 Score 0 2 0  Difficult doing work/chores Not difficult at all Not difficult at all Not difficult at all    BP Readings from Last 3 Encounters:  07/24/22 (!) 140/80  07/14/22 118/80  03/19/22 (!) 163/77    Physical Exam Vitals and nursing note reviewed.  HENT:     Head: Normocephalic.     Right Ear: Tympanic membrane and external ear normal.     Left Ear: Tympanic membrane and external ear normal.     Nose: Nose normal.     Mouth/Throat:     Lips: Pink.     Mouth: Mucous membranes are dry.  Eyes:     General: No scleral icterus.       Right eye: No discharge.        Left eye: No discharge.     Conjunctiva/sclera: Conjunctivae normal.     Pupils: Pupils are equal, round, and reactive to light.  Neck:     Thyroid: No thyromegaly.     Vascular: No JVD.     Trachea: No tracheal deviation.  Cardiovascular:     Rate and Rhythm: Normal rate and regular rhythm.     Heart sounds: Normal heart sounds, S1 normal and S2 normal. No murmur heard.    No systolic murmur is present.     No diastolic murmur is present.     No friction rub. No gallop. No S3 or S4 sounds.  Pulmonary:     Effort: No respiratory distress.     Breath sounds: Normal breath sounds. No decreased breath sounds, wheezing, rhonchi or rales.  Abdominal:     General: Bowel sounds are normal.     Palpations: Abdomen is soft. There is no mass.     Tenderness: There is no abdominal tenderness. There is no guarding or rebound.  Musculoskeletal:        General: No tenderness. Normal range of motion.     Cervical  back: Normal range of motion and neck supple.   Lymphadenopathy:     Cervical: No cervical adenopathy.  Skin:    General: Skin is warm.     Findings: No rash.  Neurological:     Mental Status: He is alert.     Motor: Motor function is intact.     Wt Readings from Last 3 Encounters:  07/24/22 202 lb (91.6 kg)  07/14/22 205 lb 12.8 oz (93.4 kg)  03/19/22 198 lb (89.8 kg)    BP (!) 140/80   Pulse 100   Ht 5' 7"  (1.702 m)   Wt 202 lb (91.6 kg)   BMI 31.64 kg/m   Assessment and Plan:  1. Acute cystitis with hematuria New onset of.  Follow-up urosepsis with encephalopathy. Recently hospitalized with sepsis of Enterococcus faecalis from urine culture.  Repeat of urinalysis noted only some trace red cells with no leukocytes or bacteria.  Will check CBC for current status.  White count. - POCT urinalysis dipstick - CBC with Differential/Platelet  2. Essential (primary) hypertension Patient was currently off his metoprolol benazepril hydrochlorothiazide blood pressure is starting to be elevated in the 144/86 range.  I have resumed his benazepril 10 mg holding the hydrochlorothiazide 12.5 mg and will wait until we recheck patient in 2 weeks. - benazepril (LOTENSIN) 10 MG tablet; Take 1 tablet (10 mg total) by mouth daily.  Dispense: 90 tablet; Refill: 3 - Renal Function Panel - CBC with Differential/Platelet  3. Dehydration Exam is still consistent with dehydration and will hold diuretic. - Renal Function Panel  4. History of sepsis History of sepsis we will recheck renal function panel for GFR, CBC for white blood cell count/lactic acid/BMP. - Renal Function Panel - CBC with Differential/Platelet - Lactic acid, plasma - Brain natriuretic peptide; Future  5. Elevated lactic acid level As noted above - Lactic acid, plasma  6. Elevated brain natriuretic peptide (BNP) level As noted above - Brain natriuretic peptide; Future    Otilio Miu, MD

## 2022-07-29 DIAGNOSIS — Z8619 Personal history of other infectious and parasitic diseases: Secondary | ICD-10-CM | POA: Diagnosis not present

## 2022-07-29 DIAGNOSIS — E860 Dehydration: Secondary | ICD-10-CM | POA: Diagnosis not present

## 2022-07-29 DIAGNOSIS — N3001 Acute cystitis with hematuria: Secondary | ICD-10-CM | POA: Diagnosis not present

## 2022-07-29 DIAGNOSIS — R7989 Other specified abnormal findings of blood chemistry: Secondary | ICD-10-CM | POA: Diagnosis not present

## 2022-07-29 DIAGNOSIS — I10 Essential (primary) hypertension: Secondary | ICD-10-CM | POA: Diagnosis not present

## 2022-07-30 LAB — CBC WITH DIFFERENTIAL/PLATELET
Basophils Absolute: 0.1 10*3/uL (ref 0.0–0.2)
Basos: 1 %
EOS (ABSOLUTE): 0.2 10*3/uL (ref 0.0–0.4)
Eos: 2 %
Hematocrit: 39.6 % (ref 37.5–51.0)
Hemoglobin: 12.6 g/dL — ABNORMAL LOW (ref 13.0–17.7)
Immature Grans (Abs): 0 10*3/uL (ref 0.0–0.1)
Immature Granulocytes: 0 %
Lymphocytes Absolute: 1.3 10*3/uL (ref 0.7–3.1)
Lymphs: 18 %
MCH: 29 pg (ref 26.6–33.0)
MCHC: 31.8 g/dL (ref 31.5–35.7)
MCV: 91 fL (ref 79–97)
Monocytes Absolute: 0.7 10*3/uL (ref 0.1–0.9)
Monocytes: 10 %
Neutrophils Absolute: 4.9 10*3/uL (ref 1.4–7.0)
Neutrophils: 69 %
Platelets: 273 10*3/uL (ref 150–450)
RBC: 4.34 x10E6/uL (ref 4.14–5.80)
RDW: 14.2 % (ref 11.6–15.4)
WBC: 7.1 10*3/uL (ref 3.4–10.8)

## 2022-07-30 LAB — RENAL FUNCTION PANEL
Albumin: 4 g/dL (ref 3.8–4.8)
BUN/Creatinine Ratio: 11 (ref 10–24)
BUN: 12 mg/dL (ref 8–27)
CO2: 26 mmol/L (ref 20–29)
Calcium: 9.3 mg/dL (ref 8.6–10.2)
Chloride: 105 mmol/L (ref 96–106)
Creatinine, Ser: 1.07 mg/dL (ref 0.76–1.27)
Glucose: 125 mg/dL — ABNORMAL HIGH (ref 70–99)
Phosphorus: 3.2 mg/dL (ref 2.8–4.1)
Potassium: 5 mmol/L (ref 3.5–5.2)
Sodium: 143 mmol/L (ref 134–144)
eGFR: 72 mL/min/{1.73_m2} (ref >59)

## 2022-07-30 LAB — LACTIC ACID, PLASMA: Lactate, Ven: 12.8 mg/dL (ref 4.8–25.7)

## 2022-08-03 ENCOUNTER — Telehealth: Payer: Self-pay | Admitting: Family Medicine

## 2022-08-03 NOTE — Telephone Encounter (Signed)
Timothy Hatfield is calling to report that she will have the Monovisc injection to be delivered to the office Tomorrow 08/04/22

## 2022-08-03 NOTE — Telephone Encounter (Signed)
Noted thank you

## 2022-08-03 NOTE — Telephone Encounter (Signed)
Called pt left him know the pharmacy is trying to reach him for monovisc injection. Gave pt the number to call.Pt verbalized understanding.  KP

## 2022-08-03 NOTE — Telephone Encounter (Signed)
Copied from [place] [id number]. Topic: General - Other >> Aug 03, 2022 12:43 PM Sabas Sous wrote: Reason for CRM: Morey Hummingbird from [place] called to report that she has not successfully contacted the patient. He is not answering their calls. She says this is regarding the [place] contact [phone]

## 2022-08-07 ENCOUNTER — Encounter: Payer: Self-pay | Admitting: Family Medicine

## 2022-08-07 ENCOUNTER — Ambulatory Visit: Payer: Medicare PPO | Admitting: Family Medicine

## 2022-08-07 DIAGNOSIS — I10 Essential (primary) hypertension: Secondary | ICD-10-CM

## 2022-08-07 MED ORDER — METOPROLOL SUCCINATE ER 25 MG PO TB24: 25.0000 mg | ORAL_TABLET | Freq: Every day | ORAL | 1 refills | Status: DC

## 2022-08-07 MED ORDER — BENAZEPRIL-HYDROCHLOROTHIAZIDE 10-12.5 MG PO TABS: 1.0000 | ORAL_TABLET | Freq: Every day | ORAL | 0 refills | Status: DC

## 2022-08-07 NOTE — Progress Notes (Signed)
Date:  08/07/2022   Name:  Timothy Hatfield   DOB:  March 11, 1946   MRN:  [id number]   Chief Complaint: Hypertension (Dropped the HCTZ  from Benazepril)  Hypertension This is a chronic problem. The current episode started more than 1 year ago. The problem has been gradually improving since onset. The problem is controlled. Pertinent negatives include no anxiety, blurred vision, chest pain, headaches, malaise/fatigue, neck pain, orthopnea, palpitations, peripheral edema, PND, shortness of breath or sweats. There are no associated agents to hypertension. Past treatments include ACE inhibitors and beta blockers. The current treatment provides no improvement (worsen).    Lab Results  Component Value Date   NA 143 07/29/2022   K 5.0 07/29/2022   CO2 26 07/29/2022   GLUCOSE 125 (H) 07/29/2022   BUN 12 07/29/2022   CREATININE 1.07 07/29/2022   CALCIUM 9.3 07/29/2022   EGFR 72 07/29/2022   GFRNONAA 42 (L) 07/24/2020   Lab Results  Component Value Date   CHOL 170 03/10/2022   HDL 53 03/10/2022   LDLCALC 96 03/10/2022   TRIG 118 03/10/2022   CHOLHDL 2.6 10/03/2018   No results found for: "TSH" No results found for: "HGBA1C" Lab Results  Component Value Date   WBC 7.1 07/29/2022   HGB 12.6 (L) 07/29/2022   HCT 39.6 07/29/2022   MCV 91 07/29/2022   PLT 273 07/29/2022   Lab Results  Component Value Date   ALT 24 03/27/2020   AST 25 03/27/2020   ALKPHOS 64 03/27/2020   BILITOT 0.9 03/27/2020   No results found for: "25OHVITD2", "25OHVITD3", "VD25OH"   Review of Systems  Constitutional:  Negative for fatigue, fever, malaise/fatigue and unexpected weight change.  HENT:  Negative for trouble swallowing.   Eyes:  Negative for blurred vision.  Respiratory:  Negative for cough, chest tightness and shortness of breath.   Cardiovascular:  Negative for chest pain, palpitations, orthopnea and PND.  Gastrointestinal:  Negative for abdominal distention and abdominal pain.   Musculoskeletal:  Negative for neck pain.  Neurological:  Negative for headaches.    Patient Active Problem List   Diagnosis Date Noted   Primary osteoarthritis of one knee, right 03/17/2022   Benign prostatic hyperplasia with lower urinary tract symptoms 01/12/2020   GERD without esophagitis 03/25/2018   Moderate episode of recurrent major depressive disorder ([place]) 03/25/2018   Acute calcific periarthritis 06/18/2015   Allergic rhinitis 06/18/2015   Arthritis 06/18/2015   Essential (primary) hypertension 06/18/2015   Barrett esophagus 06/18/2015   HLD (hyperlipidemia) 06/18/2015   External hemorrhoid 06/18/2015   Gastroenteritis 06/18/2015   Bilateral nephrolithiasis 05/15/2014    Allergies  Allergen Reactions   Bee Venom Anaphylaxis   Ciprofloxacin Itching    Past Surgical History:  Procedure Laterality Date   CARDIAC CATHETERIZATION Left 09/09/2016   Procedure: Left Heart Cath and Coronary Angiography;  Surgeon: Yolonda Kida, MD;  Location: [facility];  Service: Cardiovascular;  Laterality: Left;   COLONOSCOPY  2013   normal/ Dr Vira Agar- cleared for 10 yrs   COLONOSCOPY WITH PROPOFOL N/A 01/03/2018   Procedure: COLONOSCOPY WITH PROPOFOL;  Surgeon: Manya Silvas, MD;  Location: [facility];  Service: Endoscopy;  Laterality: N/A;   CORONARY ARTERY BYPASS GRAFT     CYSTOSCOPY W/ RETROGRADES Bilateral 07/11/2020   Procedure: CYSTOSCOPY WITH RETROGRADE PYELOGRAM;  Surgeon: Billey Co, MD;  Location: [facility];  Service: Urology;  Laterality: Bilateral;   CYSTOSCOPY W/ RETROGRADES  07/26/2020   Procedure:  CYSTOSCOPY WITH RETROGRADE PYELOGRAM;  Surgeon: Billey Co, MD;  Location: [facility];  Service: Urology;;   CYSTOSCOPY W/ URETERAL STENT REMOVAL Right 07/26/2020   Procedure: CYSTOSCOPY WITH STENT REMOVAL;  Surgeon: Billey Co, MD;  Location: [facility];  Service: Urology;  Laterality: Right;   CYSTOSCOPY/URETEROSCOPY/HOLMIUM LASER/STENT  PLACEMENT Bilateral 07/11/2020   Procedure: CYSTOSCOPY/URETEROSCOPY/HOLMIUM LASER/STENT PLACEMENT;  Surgeon: Billey Co, MD;  Location: [facility];  Service: Urology;  Laterality: Bilateral;   CYSTOSCOPY/URETEROSCOPY/HOLMIUM LASER/STENT PLACEMENT Left 07/26/2020   Procedure: CYSTOSCOPY/URETEROSCOPY/HOLMIUM LASER/STENT EXCHANGE;  Surgeon: Billey Co, MD;  Location: [facility];  Service: Urology;  Laterality: Left;   ESOPHAGOGASTRODUODENOSCOPY (EGD) WITH PROPOFOL N/A 01/03/2018   Procedure: ESOPHAGOGASTRODUODENOSCOPY (EGD) WITH PROPOFOL;  Surgeon: Manya Silvas, MD;  Location: [facility];  Service: Endoscopy;  Laterality: N/A;   EXTRACORPOREAL SHOCK WAVE LITHOTRIPSY Left 07/11/2020   Procedure: EXTRACORPOREAL SHOCK WAVE LITHOTRIPSY (ESWL);  Surgeon: Billey Co, MD;  Location: [facility];  Service: Urology;  Laterality: Left;   EYE SURGERY     HERNIA REPAIR     IR NEPHROSTOMY PLACEMENT RIGHT  03/15/2018   kidney stones     lithotripsy   LITHOTRIPSY     NEPHROLITHOTOMY Right 03/15/2018   Procedure: NEPHROLITHOTOMY PERCUTANEOUS;  Surgeon: Abbie Sons, MD;  Location: [facility];  Service: Urology;  Laterality: Right;   STONE EXTRACTION WITH BASKET  07/11/2020   Procedure: BLADDER STONE EXTRACTION;  Surgeon: Billey Co, MD;  Location: [facility];  Service: Urology;;   UPPER GI ENDOSCOPY  2013    Social History   Tobacco Use   Smoking status: Never    Passive exposure: Never   Smokeless tobacco: Never  Vaping Use   Vaping Use: Never used  Substance Use Topics   Alcohol use: No    Alcohol/week: 0.0 standard drinks of alcohol   Drug use: No     Medication list has been reviewed and updated.  Current Meds  Medication Sig   acetaminophen (TYLENOL) 500 MG tablet Take 1,000 mg by mouth daily as needed for moderate pain.   aspirin EC 81 MG tablet Take 81 mg by mouth daily.   benazepril (LOTENSIN) 10 MG tablet Take 1 tablet (10 mg total) by mouth daily.   Coenzyme Q10  (COQ-10) 100 MG CAPS Take 100 mg by mouth at bedtime.   Dutasteride-Tamsulosin HCl 0.5-0.4 MG CAPS Take 1 capsule by mouth daily.   EPINEPHrine 0.3 mg/0.3 mL IJ SOAJ injection Inject 0.3 mg into the muscle as needed for anaphylaxis.   loratadine (CLARITIN) 10 MG tablet Take 1 tablet (10 mg total) by mouth daily. For allergies   meloxicam (MOBIC) 15 MG tablet Take 1 tablet (15 mg total) by mouth daily.   metoprolol succinate (TOPROL-XL) 25 MG 24 hr tablet Take 1 tablet (25 mg total) by mouth daily.   Multiple Vitamin (MULTIVITAMIN WITH MINERALS) TABS tablet Take 1 tablet by mouth at bedtime.   mupirocin ointment (BACTROBAN) 2 % Apply 1 application topically 2 (two) times daily.   nitroGLYCERIN (NITROSTAT) 0.4 MG SL tablet Place 1 tablet (0.4 mg total) under the tongue every 5 (five) minutes as needed for chest pain.   pantoprazole (PROTONIX) 40 MG tablet Take 1 tablet (40 mg total) by mouth daily.   rosuvastatin (CRESTOR) 5 MG tablet Take 5 mg by mouth daily.   sertraline (ZOLOFT) 50 MG tablet Take 1 tablet (50 mg total) by mouth daily. One tablet daily   triamcinolone (NASACORT) 55 MCG/ACT AERO nasal  inhaler Place 2 sprays into the nose daily. otc       08/07/2022    2:13 PM 07/24/2022    3:31 PM 07/14/2022    3:36 PM 03/17/2022    2:06 PM  GAD 7 : Generalized Anxiety Score  Nervous, Anxious, on Edge 0 0 0 0  Control/stop worrying 0 0 0 0  Worry too much - different things 0 0 0 0  Trouble relaxing 0 0 0 0  Restless 0 0 0 0  Easily annoyed or irritable 0 0 0 0  Afraid - awful might happen 0 0 0 0  Total GAD 7 Score 0 0 0 0  Anxiety Difficulty Not difficult at all Not difficult at all Not difficult at all Not difficult at all       08/07/2022    2:13 PM 07/24/2022    3:31 PM 07/14/2022    3:35 PM  Depression screen PHQ 2/9  Decreased Interest 0 0 0  Down, Depressed, Hopeless 0 0 0  PHQ - 2 Score 0 0 0  Altered sleeping 0 0 0  Tired, decreased energy 0 0 1  Change in appetite 0 0 1   Feeling bad or failure about yourself  0 0 0  Trouble concentrating 0 0 0  Moving slowly or fidgety/restless 0 0 0  Suicidal thoughts 0 0 0  PHQ-9 Score 0 0 2  Difficult doing work/chores Not difficult at all Not difficult at all Not difficult at all    BP Readings from Last 3 Encounters:  08/07/22 (!) 184/92  07/24/22 (!) 144/86  07/14/22 118/80    Physical Exam HENT:     Right Ear: Tympanic membrane normal.     Left Ear: Tympanic membrane normal.     Mouth/Throat:     Mouth: Mucous membranes are moist.  Cardiovascular:     Rate and Rhythm: Normal rate and regular rhythm.     Pulses: Normal pulses.     Heart sounds: Normal heart sounds. No murmur heard.    No friction rub. No gallop.  Pulmonary:     Breath sounds: No wheezing, rhonchi or rales.  Abdominal:     Tenderness: There is no abdominal tenderness.  Musculoskeletal:     Cervical back: Neck supple.  Neurological:     Mental Status: He is alert.     Wt Readings from Last 3 Encounters:  08/07/22 202 lb (91.6 kg)  07/24/22 202 lb (91.6 kg)  07/14/22 205 lb 12.8 oz (93.4 kg)    BP (!) 184/92   Pulse 76   Ht _0  (1.702 m)   Wt 202 lb (91.6 kg)   BMI 31.64 kg/m   Assessment and Plan: 1. Essential (primary) hypertension Chronic.  Uncontrolled.  Patient's blood pressure worsened since we stopped his hydrochlorothiazide due to some dehydration.  Upon recheck it was elevated to the 160/90 some range which is higher than previous readings.  We will resume benazepril hydrochlorothiazide 10-12 0.5 once a day as well as continue metoprolol at current dosing of 25 mg XL.  We will recheck in 90 days. - benazepril-hydrochlorthiazide (LOTENSIN HCT) 10-12.5 MG tablet; Take 1 tablet by mouth daily.  Dispense: 90 tablet; Refill: 0 - metoprolol succinate (TOPROL-XL) 25 MG 24 hr tablet; Take 1 tablet (25 mg total) by mouth daily.  Dispense: 90 tablet; Refill: 1     Otilio Miu, MD

## 2022-08-10 ENCOUNTER — Encounter: Payer: Self-pay | Admitting: Family Medicine

## 2022-08-10 ENCOUNTER — Ambulatory Visit: Payer: Medicare PPO | Admitting: Family Medicine

## 2022-08-10 ENCOUNTER — Inpatient Hospital Stay: Payer: Self-pay | Admitting: Radiology

## 2022-08-10 VITALS — BP 148/96 | HR 88 | Ht 67.0 in | Wt 194.0 lb

## 2022-08-10 DIAGNOSIS — M1711 Unilateral primary osteoarthritis, right knee: Secondary | ICD-10-CM

## 2022-08-10 DIAGNOSIS — N3001 Acute cystitis with hematuria: Secondary | ICD-10-CM

## 2022-08-10 NOTE — Progress Notes (Signed)
     Primary Care / Sports Medicine Office Visit  Patient Information:  Patient ID: Timothy Hatfield, male DOB: 11-12-1946 Age: 76 y.o. MRN: [id number]   Timothy Hatfield is a pleasant 76 y.o. male presenting with the following:  Chief Complaint  Patient presents with   Primary osteoarthritis of one knee, right    Vitals:   08/10/22 1540 08/10/22 1543  BP: (!) 150/98 (!) 148/96  Pulse: 88    Vitals:   08/10/22 1540  Weight: 194 lb (88 kg)  Height: '5\' 7"'$  (1.702 m)   Body mass index is 30.38 kg/m.  [place] LIMITED JOINT SPACE STRUCTURES LOW RIGHT  Result Date: 07/14/2022 Procedure:  Injection of right knee under ultrasound guidance. Ultrasound guidance utilized for anteromedial approach to right knee, no effusion noted Samsung HS60 device utilized with permanent recording / reporting. Verbal informed consent obtained and verified. Skin prepped in a sterile fashion. Ethyl chloride for topical local analgesia. Completed without difficulty and tolerated well. Medication: triamcinolone acetonide 40 mg/mL suspension for injection 1 mL total and 2 mL lidocaine 1% without epinephrine utilized for needle placement anesthetic Advised to contact for fevers/chills, erythema, induration, drainage, or persistent bleeding.     Independent interpretation of notes and tests performed by another provider:   None  Procedures performed:   Procedure:  Injection of Right knee under ultrasound guidance. Ultrasound guidance utilized for anteromedial approach, no effusion Samsung HS60 device utilized with permanent recording / reporting. Verbal informed consent obtained and verified. Skin prepped in a sterile fashion. Ethyl chloride for topical local analgesia.  Completed without difficulty and tolerated well. Medication: Monovisc YTK#[id number] EXP 11/23/2023 suspension for injection (88 mg) 4 mL total Advised to contact for fevers/chills, erythema, induration, drainage, or persistent  bleeding.   Pertinent History, Exam, Impression, and Recommendations:   Problem List Items Addressed This Visit       Musculoskeletal and Integument   Primary osteoarthritis of one knee, right    Patient presents for scheduled right knee viscosupplementation injection, tolerated procedure well, post care reviewed.  I have encouraged patient to start AAOS knee exercises and we will maintain close follow-up in 2 months for reevaluation.  Recalcitrant symptoms can be further addressed with repeat corticosteroid, consideration of genicular nerve block, formal PT.  Surgical options can be reserved for severe recalcitrant symptoms despite exhausting all nonsurgical options.      Relevant Orders   [place] LIMITED JOINT SPACE STRUCTURES UP RIGHT   Other Visit Diagnoses     Acute cystitis with hematuria    -  Primary        Orders & Medications No orders of the defined types were placed in this encounter.  Orders Placed This Encounter  Procedures   [place] LIMITED JOINT SPACE STRUCTURES UP RIGHT     Return in about 2 months (around 10/10/2022).     Montel Culver, MD   Primary Care Sports Medicine [place]

## 2022-08-10 NOTE — Patient Instructions (Signed)
You have just been given a gel lubricant injection to reduce knee pain. You can resume normal activities but avoid anything strenuous that puts excess strain on the joint for 48 hours. Avoid activities such as jogging, soccer, tennis, heavy lifting, or standing on your feet for a long time. If you do have pain, simply rest the joint and use ice. If you can tolerate over the counter medications, you can try Tylenol, Aleve, or Advil for added relief per package instructions. - The gel can be repeated in 6 months, contact our office to schedule or for any questions

## 2022-08-10 NOTE — Assessment & Plan Note (Addendum)
Patient presents for scheduled right knee viscosupplementation injection, tolerated procedure well, post care reviewed.  I have encouraged patient to start AAOS knee exercises and we will maintain close follow-up in 2 months for reevaluation.  Recalcitrant symptoms can be further addressed with repeat corticosteroid, consideration of genicular nerve block, formal PT.  Surgical options can be reserved for severe recalcitrant symptoms despite exhausting all nonsurgical options.

## 2022-08-17 ENCOUNTER — Other Ambulatory Visit: Payer: Self-pay | Admitting: Family Medicine

## 2022-08-17 DIAGNOSIS — G8929 Other chronic pain: Secondary | ICD-10-CM

## 2022-08-18 NOTE — Telephone Encounter (Signed)
Requested medication (s) are due for refill today: yes  Requested medication (s) are on the active medication list: yes  Last refill:  03/10/22 #30 2 RF  Future visit scheduled: yes  Notes to clinic:  overdue lab work-(AST, ALT)   Requested Prescriptions  Pending Prescriptions Disp Refills   meloxicam (MOBIC) 15 MG tablet [Pharmacy Med Name: MELOXICAM 15 MG TAB] 30 tablet 2    Sig: TAKE 1 TABLET BY MOUTH DAILY     Analgesics:  COX2 Inhibitors Failed - 08/17/2022  2:47 PM      Failed - Manual Review: Labs are only required if the patient has taken medication for more than 8 weeks.      Failed - HGB in normal range and within 360 days    Hemoglobin  Date Value Ref Range Status  07/29/2022 12.6 (L) 13.0 - 17.7 g/dL Final         Failed - AST in normal range and within 360 days    AST  Date Value Ref Range Status  03/27/2020 25 0 - 40 IU/L Final         Failed - ALT in normal range and within 360 days    ALT  Date Value Ref Range Status  03/27/2020 24 0 - 44 IU/L Final         Passed - Cr in normal range and within 360 days    Creatinine, Ser  Date Value Ref Range Status  07/29/2022 1.07 0.76 - 1.27 mg/dL Final         Passed - HCT in normal range and within 360 days    Hematocrit  Date Value Ref Range Status  07/29/2022 39.6 37.5 - 51.0 % Final         Passed - eGFR is 30 or above and within 360 days    GFR calc Af Amer  Date Value Ref Range Status  07/24/2020 49 (L) >60 mL/min Final   GFR calc non Af Amer  Date Value Ref Range Status  07/24/2020 42 (L) >60 mL/min Final   eGFR  Date Value Ref Range Status  07/29/2022 72 >59 mL/min/1.73 Final         Passed - Patient is not pregnant      Passed - Valid encounter within last 12 months    Recent Outpatient Visits           1 week ago Acute cystitis with hematuria   [place] [place], Earley Abide, MD   1 week ago Essential (primary) hypertension   [facility] at [place], [place], MD   3 weeks ago Acute cystitis with hematuria   [place] [facility] at [place], Deanna C, MD   1 month ago Primary osteoarthritis of one knee, right   [facility] at [place], Earley Abide, MD   5 months ago Primary osteoarthritis of one knee, right   [facility] at [facility], Earley Abide, MD       Future Appointments             In 3 weeks Juline Patch, MD [facility] Medicine at [facility], [facility]   In 1 month Zigmund Daniel, Earley Abide, MD [facility] and [facility] Medicine at [facility], [facility]   In  7 months Diamantina Providence, Herbert Seta, [place]

## 2022-08-19 ENCOUNTER — Ambulatory Visit: Payer: Medicare PPO | Admitting: Family Medicine

## 2022-09-01 DIAGNOSIS — L578 Other skin changes due to chronic exposure to nonionizing radiation: Secondary | ICD-10-CM | POA: Diagnosis not present

## 2022-09-01 DIAGNOSIS — L570 Actinic keratosis: Secondary | ICD-10-CM | POA: Diagnosis not present

## 2022-09-01 DIAGNOSIS — D2221 Melanocytic nevi of right ear and external auricular canal: Secondary | ICD-10-CM | POA: Diagnosis not present

## 2022-09-01 DIAGNOSIS — D225 Melanocytic nevi of trunk: Secondary | ICD-10-CM | POA: Diagnosis not present

## 2022-09-01 DIAGNOSIS — Z872 Personal history of diseases of the skin and subcutaneous tissue: Secondary | ICD-10-CM | POA: Diagnosis not present

## 2022-09-01 DIAGNOSIS — D485 Neoplasm of uncertain behavior of skin: Secondary | ICD-10-CM | POA: Diagnosis not present

## 2022-09-10 ENCOUNTER — Ambulatory Visit: Payer: Medicare PPO | Admitting: Family Medicine

## 2022-09-10 ENCOUNTER — Encounter: Payer: Self-pay | Admitting: Family Medicine

## 2022-09-10 VITALS — BP 146/76 | HR 87 | Ht 67.0 in | Wt 198.0 lb

## 2022-09-10 DIAGNOSIS — K2270 Barrett's esophagus without dysplasia: Secondary | ICD-10-CM

## 2022-09-10 DIAGNOSIS — F329 Major depressive disorder, single episode, unspecified: Secondary | ICD-10-CM | POA: Diagnosis not present

## 2022-09-10 DIAGNOSIS — I10 Essential (primary) hypertension: Secondary | ICD-10-CM

## 2022-09-10 DIAGNOSIS — Z23 Encounter for immunization: Secondary | ICD-10-CM

## 2022-09-10 MED ORDER — BENAZEPRIL-HYDROCHLOROTHIAZIDE 10-12.5 MG PO TABS: 1.0000 | ORAL_TABLET | Freq: Every day | ORAL | 0 refills | Status: DC

## 2022-09-10 MED ORDER — PANTOPRAZOLE SODIUM 40 MG PO TBEC: 40.0000 mg | DELAYED_RELEASE_TABLET | Freq: Every day | ORAL | 1 refills | Status: DC

## 2022-09-10 MED ORDER — METOPROLOL SUCCINATE ER 50 MG PO TB24: 50.0000 mg | ORAL_TABLET | Freq: Every day | ORAL | 0 refills | Status: DC

## 2022-09-10 NOTE — Progress Notes (Signed)
[place]   Date:  09/10/2022   Name:  Timothy Hatfield   DOB:  Aug 09, 1946   MRN:  [id number]   Chief Complaint: Hypertension, Gastroesophageal Reflux, and Depression  Hypertension This is a chronic problem. The current episode started in the past 7 days. The problem has been gradually improving since onset. The problem is uncontrolled. Pertinent negatives include no anxiety, blurred vision, chest pain, headaches, malaise/fatigue, neck pain, orthopnea, palpitations, peripheral edema, PND, shortness of breath or sweats. There are no associated agents to hypertension. Risk factors for coronary artery disease include dyslipidemia. Past treatments include ACE inhibitors and diuretics. The current treatment provides moderate improvement. There are no compliance problems.  There is no history of angina, kidney disease, CAD/MI, CVA, heart failure, left ventricular hypertrophy, PVD or retinopathy. There is no history of chronic renal disease, a hypertension causing med or renovascular disease.  Gastroesophageal Reflux He reports no abdominal pain, no chest pain, no coughing, no dysphagia, no nausea or no wheezing. He has tried a PPI for the symptoms. The treatment provided moderate relief.  Depression        This is a chronic problem.  The current episode started more than 1 year ago.   The problem occurs intermittently.  The problem has been gradually improving since onset.  Associated symptoms include no helplessness, no hopelessness, no restlessness, no decreased interest, no appetite change, no headaches, not sad and no suicidal ideas.  Past treatments include SSRIs - Selective serotonin reuptake inhibitors.   Pertinent negatives include no anxiety.   Lab Results  Component Value Date   NA 143 07/29/2022   K 5.0 07/29/2022   CO2 26 07/29/2022   GLUCOSE 125 (H) 07/29/2022   BUN 12 07/29/2022   CREATININE 1.07 07/29/2022   CALCIUM 9.3 07/29/2022   EGFR 72 07/29/2022   GFRNONAA 42 (L) 07/24/2020    Lab Results  Component Value Date   CHOL 170 03/10/2022   HDL 53 03/10/2022   LDLCALC 96 03/10/2022   TRIG 118 03/10/2022   CHOLHDL 2.6 10/03/2018   No results found for: "TSH" No results found for: "HGBA1C" Lab Results  Component Value Date   WBC 7.1 07/29/2022   HGB 12.6 (L) 07/29/2022   HCT 39.6 07/29/2022   MCV 91 07/29/2022   PLT 273 07/29/2022   Lab Results  Component Value Date   ALT 24 03/27/2020   AST 25 03/27/2020   ALKPHOS 64 03/27/2020   BILITOT 0.9 03/27/2020   No results found for: "25OHVITD2", "25OHVITD3", "VD25OH"   Review of Systems  Constitutional:  Negative for appetite change and malaise/fatigue.  HENT:  Negative for postnasal drip, rhinorrhea and trouble swallowing.   Eyes:  Negative for blurred vision.  Respiratory:  Negative for cough, chest tightness, shortness of breath and wheezing.   Cardiovascular:  Negative for chest pain, palpitations, orthopnea, leg swelling and PND.  Gastrointestinal:  Negative for abdominal pain, dysphagia and nausea.  Endocrine: Negative for polydipsia and polyuria.  Musculoskeletal:  Negative for neck pain.  Neurological:  Negative for headaches.  Hematological:  Negative for adenopathy. Does not bruise/bleed easily.  Psychiatric/Behavioral:  Positive for depression. Negative for suicidal ideas.     Patient Active Problem List   Diagnosis Date Noted   Primary osteoarthritis of one knee, right 03/17/2022   Benign prostatic hyperplasia with lower urinary tract symptoms 01/12/2020   GERD without esophagitis 03/25/2018   Moderate episode of recurrent major depressive disorder ([place]) 03/25/2018   Acute calcific periarthritis 06/18/2015  Allergic rhinitis 06/18/2015   Arthritis 06/18/2015   Essential (primary) hypertension 06/18/2015   Barrett esophagus 06/18/2015   HLD (hyperlipidemia) 06/18/2015   External hemorrhoid 06/18/2015   Gastroenteritis 06/18/2015   Bilateral nephrolithiasis 05/15/2014    Allergies   Allergen Reactions   Bee Venom Anaphylaxis   Ciprofloxacin Itching    Past Surgical History:  Procedure Laterality Date   CARDIAC CATHETERIZATION Left 09/09/2016   Procedure: Left Heart Cath and Coronary Angiography;  Surgeon: Yolonda Kida, MD;  Location: [place] [facility];  Service: Cardiovascular;  Laterality: Left;   COLONOSCOPY  2013   normal/ Dr Vira Agar- cleared for 10 yrs   COLONOSCOPY WITH PROPOFOL N/A 01/03/2018   Procedure: COLONOSCOPY WITH PROPOFOL;  Surgeon: Manya Silvas, MD;  Location: [facility] ENDOS[facility];  Service: Endoscopy;  Laterality: N/A;   CORONARY ARTERY BYPASS GRAFT     CYSTOSCOPY W/ RETROGRADES Bilateral 07/11/2020   Procedure: CYSTOSCOPY WITH RETROGRADE PYELOGRAM;  Surgeon: Billey Co, MD;  Location: [facility];  Service: Urology;  Laterality: Bilateral;   CYSTOSCOPY W/ RETROGRADES  07/26/2020   Procedure: CYSTOSCOPY WITH RETROGRADE PYELOGRAM;  Surgeon: Billey Co, MD;  Location: [facility];  Service: Urology;;   CYSTOSCOPY W/ URETERAL STENT REMOVAL Right 07/26/2020   Procedure: CYSTOSCOPY WITH STENT REMOVAL;  Surgeon: Billey Co, MD;  Location: [facility];  Service: Urology;  Laterality: Right;   CYSTOSCOPY/URETEROSCOPY/HOLMIUM LASER/STENT PLACEMENT Bilateral 07/11/2020   Procedure: CYSTOSCOPY/URETEROSCOPY/HOLMIUM LASER/STENT PLACEMENT;  Surgeon: Billey Co, MD;  Location: [facility];  Service: Urology;  Laterality: Bilateral;   CYSTOSCOPY/URETEROSCOPY/HOLMIUM LASER/STENT PLACEMENT Left 07/26/2020   Procedure: CYSTOSCOPY/URETEROSCOPY/HOLMIUM LASER/STENT EXCHANGE;  Surgeon: Billey Co, MD;  Location: [facility];  Service: Urology;  Laterality: Left;   ESOPHAGOGASTRODUODENOSCOPY (EGD) WITH PROPOFOL N/A 01/03/2018   Procedure: ESOPHAGOGASTRODUODENOSCOPY (EGD) WITH PROPOFOL;  Surgeon: Manya Silvas, MD;  Location: [facility] ENDOSCOPY;  Service: Endoscopy;  Laterality: N/A;   EXTRACORPOREAL SHOCK WAVE LITHOTRIPSY Left 07/11/2020   Procedure:  EXTRACORPOREAL SHOCK WAVE LITHOTRIPSY (ESWL);  Surgeon: Billey Co, MD;  Location: [facility];  Service: Urology;  Laterality: Left;   EYE SURGERY     HERNIA REPAIR     IR NEPHROSTOMY PLACEMENT RIGHT  03/15/2018   kidney stones     lithotripsy   LITHOTRIPSY     NEPHROLITHOTOMY Right 03/15/2018   Procedure: NEPHROLITHOTOMY PERCUTANEOUS;  Surgeon: Abbie Sons, MD;  Location: [facility];  Service: Urology;  Laterality: Right;   STONE EXTRACTION WITH BASKET  07/11/2020   Procedure: BLADDER STONE EXTRACTION;  Surgeon: Billey Co, MD;  Location: [facility];  Service: Urology;;   UPPER GI ENDOSCOPY  2013    Social History   Tobacco Use   Smoking status: Never    Passive exposure: Never   Smokeless tobacco: Never  Vaping Use   Vaping Use: Never used  Substance Use Topics   Alcohol use: No    Alcohol/week: 0.0 standard drinks of alcohol   Drug use: No     Medication list has been reviewed and updated.  Current Meds  Medication Sig   acetaminophen (TYLENOL) 500 MG tablet Take 1,000 mg by mouth daily as needed for moderate pain.   aspirin EC 81 MG tablet Take 81 mg by mouth daily.   benazepril-hydrochlorthiazide (LOTENSIN HCT) 10-12.5 MG tablet Take 1 tablet by mouth daily.   Coenzyme Q10 (COQ-10) 100 MG CAPS Take 100 mg by mouth at bedtime.   Dutasteride-Tamsulosin HCl 0.5-0.4 MG CAPS Take 1 capsule by mouth daily.  EPINEPHrine 0.3 mg/0.3 mL IJ SOAJ injection Inject 0.3 mg into the muscle as needed for anaphylaxis.   loratadine (CLARITIN) 10 MG tablet Take 1 tablet (10 mg total) by mouth daily. For allergies   meloxicam (MOBIC) 15 MG tablet TAKE 1 TABLET BY MOUTH DAILY   metoprolol succinate (TOPROL-XL) 25 MG 24 hr tablet Take 1 tablet (25 mg total) by mouth daily.   Multiple Vitamin (MULTIVITAMIN WITH MINERALS) TABS tablet Take 1 tablet by mouth at bedtime.   mupirocin ointment (BACTROBAN) 2 % Apply 1 application topically 2 (two) times daily.   nitroGLYCERIN (NITROSTAT)  0.4 MG SL tablet Place 1 tablet (0.4 mg total) under the tongue every 5 (five) minutes as needed for chest pain.   pantoprazole (PROTONIX) 40 MG tablet Take 1 tablet (40 mg total) by mouth daily.   rosuvastatin (CRESTOR) 5 MG tablet Take 5 mg by mouth daily.   sertraline (ZOLOFT) 50 MG tablet Take 1 tablet (50 mg total) by mouth daily. One tablet daily   triamcinolone (NASACORT) 55 MCG/ACT AERO nasal inhaler Place 2 sprays into the nose daily. otc   [DISCONTINUED] benazepril (LOTENSIN) 10 MG tablet Take 1 tablet (10 mg total) by mouth daily.       09/10/2022    1:56 PM 08/07/2022    2:13 PM 07/24/2022    3:31 PM 07/14/2022    3:36 PM  GAD 7 : Generalized Anxiety Score  Nervous, Anxious, on Edge 0 0 0 0  Control/stop worrying 0 0 0 0  Worry too much - different things 0 0 0 0  Trouble relaxing 0 0 0 0  Restless 0 0 0 0  Easily annoyed or irritable 0 0 0 0  Afraid - awful might happen 0 0 0 0  Total GAD 7 Score 0 0 0 0  Anxiety Difficulty Not difficult at all Not difficult at all Not difficult at all Not difficult at all       09/10/2022    1:55 PM 08/07/2022    2:13 PM 07/24/2022    3:31 PM  Depression screen PHQ 2/9  Decreased Interest 0 0 0  Down, Depressed, Hopeless 0 0 0  PHQ - 2 Score 0 0 0  Altered sleeping 0 0 0  Tired, decreased energy 0 0 0  Change in appetite 0 0 0  Feeling bad or failure about yourself  0 0 0  Trouble concentrating 0 0 0  Moving slowly or fidgety/restless 0 0 0  Suicidal thoughts 0 0 0  PHQ-9 Score 0 0 0  Difficult doing work/chores Not difficult at all Not difficult at all Not difficult at all    BP Readings from Last 3 Encounters:  09/10/22 (!) 146/76  08/10/22 (!) 148/96  08/07/22 (!) 162/94    Physical Exam Vitals and nursing note reviewed.  HENT:     Head: Normocephalic.     Right Ear: Tympanic membrane and external ear normal.     Left Ear: Tympanic membrane and external ear normal.     Nose: Nose normal. No congestion or rhinorrhea.      Mouth/Throat:     Mouth: Mucous membranes are moist.  Eyes:     General: No scleral icterus.       Right eye: No discharge.        Left eye: No discharge.     Conjunctiva/sclera: Conjunctivae normal.     Pupils: Pupils are equal, round, and reactive to light.  Neck:  Thyroid: No thyromegaly.     Vascular: No JVD.     Trachea: No tracheal deviation.  Cardiovascular:     Rate and Rhythm: Normal rate and regular rhythm.     Heart sounds: Normal heart sounds. No murmur heard.    No friction rub. No gallop.  Pulmonary:     Effort: No respiratory distress.     Breath sounds: Normal breath sounds. No wheezing, rhonchi or rales.  Abdominal:     General: Bowel sounds are normal.     Palpations: Abdomen is soft. There is no mass.     Tenderness: There is no abdominal tenderness. There is no guarding or rebound.  Musculoskeletal:        General: No tenderness. Normal range of motion.     Cervical back: Normal range of motion and neck supple.  Lymphadenopathy:     Cervical: No cervical adenopathy.  Skin:    General: Skin is warm.     Findings: No rash.  Neurological:     Mental Status: He is alert and oriented to person, place, and time.     Cranial Nerves: No cranial nerve deficit.     Deep Tendon Reflexes: Reflexes are normal and symmetric.     Wt Readings from Last 3 Encounters:  09/10/22 198 lb (89.8 kg)  08/10/22 194 lb (88 kg)  08/07/22 202 lb (91.6 kg)    BP (!) 146/76 (Cuff Size: Large)   Pulse 87   Ht 5' 7"  (1.702 m)   Wt 198 lb (89.8 kg)   SpO2 96%   BMI 31.01 kg/m   Assessment and Plan: 1. Essential (primary) hypertension Chronic.  Uncontrolled.  Stable.  Patient is currently was on benazepril hydrochlorothiazide 10-12 0.5 and metoprolol XL 25 mg but still has blood pressure reading of 146/76 almost there so we will increase the Toprol XL to 50 mg and will recheck patient in 6 weeks. - benazepril-hydrochlorthiazide (LOTENSIN HCT) 10-12.5 MG tablet; Take 1  tablet by mouth daily.  Dispense: 90 tablet; Refill: 0 - metoprolol succinate (TOPROL XL) 50 MG 24 hr tablet; Take 1 tablet (50 mg total) by mouth daily. Take with or immediately following a meal.  Dispense: 60 tablet; Refill: 0  2. Barrett's esophagus without dysplasia Chronic.  Controlled.  Stable.  Continue pantoprazole 40 mg once a day. - pantoprazole (PROTONIX) 40 MG tablet; Take 1 tablet (40 mg total) by mouth daily.  Dispense: 90 tablet; Refill: 1  3. Reactive depression Chronic.  Controlled.  Stable.  Patient has not been taking his SSRI but is doing very well and would like to continue off of it so we will hold medication at this time but be prepared to resume particular if [employer] has a sorry basketball season...  4. Need for immunization against influenza Discussed and administered - Flu Vaccine QUAD High Dose(Fluad)     Otilio Miu, MD

## 2022-10-13 ENCOUNTER — Ambulatory Visit: Payer: Medicare PPO | Admitting: Family Medicine

## 2022-11-02 ENCOUNTER — Ambulatory Visit: Payer: Medicare PPO | Admitting: Family Medicine

## 2022-11-02 ENCOUNTER — Encounter: Payer: Self-pay | Admitting: Family Medicine

## 2022-11-02 VITALS — BP 128/64 | HR 70 | Ht 67.0 in | Wt 203.0 lb

## 2022-11-02 DIAGNOSIS — M1711 Unilateral primary osteoarthritis, right knee: Secondary | ICD-10-CM

## 2022-11-02 NOTE — Progress Notes (Signed)
     Primary Care / Sports Medicine Office Visit  Patient Information:  Patient ID: [name], male DOB: 1946/10/22 Age: 76 y.o. MRN: [id number]   Timothy Hatfield is a pleasant 76 y.o. male presenting with the following:  Chief Complaint  Patient presents with   Knee Pain    Right knee, better but didn't stop hurting like he thought, does take tylenol with relief.     Vitals:   11/02/22 1601  BP: 128/64  Pulse: 70  SpO2: 98%   Vitals:   11/02/22 1601  Weight: 203 lb (92.1 kg)  Height: '5\' 7"'$  (1.702 m)   Body mass index is 31.79 kg/m.  No results found.   Independent interpretation of notes and tests performed by another provider:   None  Procedures performed:   None  Pertinent History, Exam, Impression, and Recommendations:   Problem List Items Addressed This Visit       Musculoskeletal and Integument   Primary osteoarthritis of one knee, right - Primary    Patient returns for follow-up to right knee osteoarthritis with focality to moderate medial tibiofemoral and patellofemoral compartments.  At last visit during 07/2022 he received viscosupplementation and has noted continued improvement ever since though without full symptom resolution.  He is able to go through most days without discomfort, with prolonged time on feet he has mild ache that he would not call pain.  No swelling, radiation of symptoms, buckling/near buckling.  He is utilizing Tylenol and Aleve as needed for additional pain control.  His examination today demonstrates no effusion, full painless range of motion from 0 to 125 degrees, limited by habitus, nontender with deep palpation about the joint lines and the facets, no laxity with varus/valgus, anterior/posterior stressing.  Given patient's excellent interim course, findings today, I did discuss treatment strategies and he will utilize hinged knee brace with patellar stabilization, can continue OTC medications on as-needed basis, and we  will watch for any symptom recurrence with low threshold to repeat viscosupplementation around March/April 2024 if indicated.  Corticosteroid injections can be utilized sooner if symptoms worsen.        Orders & Medications No orders of the defined types were placed in this encounter.  No orders of the defined types were placed in this encounter.    No follow-ups on file.     Montel Culver, MD, [facility]   Primary Care Sports Medicine Primary Care and Sports Medicine at [facility]

## 2022-11-02 NOTE — Assessment & Plan Note (Signed)
Patient returns for follow-up to right knee osteoarthritis with focality to moderate medial tibiofemoral and patellofemoral compartments.  At last visit during 07/2022 he received viscosupplementation and has noted continued improvement ever since though without full symptom resolution.  He is able to go through most days without discomfort, with prolonged time on feet he has mild ache that he would not call pain.  No swelling, radiation of symptoms, buckling/near buckling.  He is utilizing Tylenol and Aleve as needed for additional pain control.  His examination today demonstrates no effusion, full painless range of motion from 0 to 125 degrees, limited by habitus, nontender with deep palpation about the joint lines and the facets, no laxity with varus/valgus, anterior/posterior stressing.  Given patient's excellent interim course, findings today, I did discuss treatment strategies and he will utilize hinged knee brace with patellar stabilization, can continue OTC medications on as-needed basis, and we will watch for any symptom recurrence with low threshold to repeat viscosupplementation around March/April 2024 if indicated.  Corticosteroid injections can be utilized sooner if symptoms worsen.

## 2022-11-04 DIAGNOSIS — I10 Essential (primary) hypertension: Secondary | ICD-10-CM | POA: Diagnosis not present

## 2022-11-04 DIAGNOSIS — E782 Mixed hyperlipidemia: Secondary | ICD-10-CM | POA: Diagnosis not present

## 2022-11-04 DIAGNOSIS — I2510 Atherosclerotic heart disease of native coronary artery without angina pectoris: Secondary | ICD-10-CM | POA: Diagnosis not present

## 2022-11-04 DIAGNOSIS — T466X5A Adverse effect of antihyperlipidemic and antiarteriosclerotic drugs, initial encounter: Secondary | ICD-10-CM | POA: Diagnosis not present

## 2022-11-04 DIAGNOSIS — G720 Drug-induced myopathy: Secondary | ICD-10-CM | POA: Diagnosis not present

## 2022-11-04 DIAGNOSIS — Z951 Presence of aortocoronary bypass graft: Secondary | ICD-10-CM | POA: Diagnosis not present

## 2022-11-09 ENCOUNTER — Other Ambulatory Visit: Payer: Self-pay | Admitting: Family Medicine

## 2022-11-09 DIAGNOSIS — I10 Essential (primary) hypertension: Secondary | ICD-10-CM

## 2022-11-10 NOTE — Telephone Encounter (Signed)
Requested Prescriptions  Pending Prescriptions Disp Refills   metoprolol succinate (TOPROL-XL) 50 MG 24 hr tablet [Pharmacy Med Name: METOPROLOL SUCCINATE ER 50 MG TAB] 90 tablet 0    Sig: TAKE 1 TABLET BY MOUTH DAILY. TAKE WITH OR IMMEDIATELY FOLLOWING A MEAL     Cardiovascular:  Beta Blockers Passed - 11/09/2022  4:52 PM      Passed - Last BP in normal range    BP Readings from Last 1 Encounters:  11/02/22 128/64         Passed - Last Heart Rate in normal range    Pulse Readings from Last 1 Encounters:  11/02/22 70         Passed - Valid encounter within last 6 months    Recent Outpatient Visits           1 week ago Primary osteoarthritis of one knee, right   [place] [facility] at [place], Earley Abide, MD   2 months ago Essential (primary) hypertension   [place] [facility] at [place], [place], MD   3 months ago Primary osteoarthritis of one knee, right   [place] [facility] at [place], Earley Abide, MD   3 months ago Essential (primary) hypertension    Primary Care and Sports Medicine at [place], Deanna C, MD   3 months ago Acute cystitis with hematuria   [facility] and Sports Medicine at [place], [place], MD       Future Appointments             In 4 months Juline Patch, MD [facility] Primary Care and Sports Medicine at [facility],  [facility]   In 4 months Di[facility]ina [place], Herbert Seta, [place]

## 2022-12-14 ENCOUNTER — Other Ambulatory Visit: Payer: Self-pay | Admitting: Family Medicine

## 2022-12-14 DIAGNOSIS — K2270 Barrett's esophagus without dysplasia: Secondary | ICD-10-CM

## 2023-01-20 ENCOUNTER — Telehealth: Payer: Self-pay | Admitting: Family Medicine

## 2023-01-20 NOTE — Telephone Encounter (Signed)
Contacted Timothy Hatfield to schedule their annual wellness visit. Appointment made for 02/10/2020.   Sherol Dade; Care Guide Ambulatory Clinical [facility] Direct Dial: [phone]  //

## 2023-01-26 ENCOUNTER — Telehealth: Payer: Self-pay | Admitting: Family Medicine

## 2023-01-26 NOTE — Telephone Encounter (Signed)
Copied from [place] [phone]. Topic: Appointment Scheduling - Scheduling Inquiry for Clinic >> Jan 26, 2023  1:03 PM Sabas Sous wrote: Reason for CRM: Pt called and wants an injection in his knee, he wants to receive it from the online pharmacy like last time. Wants an afternoon appt  Best contact: [phone]

## 2023-01-29 NOTE — Telephone Encounter (Signed)
Faxed over to get it shipped. Will call pt when they come in

## 2023-02-08 ENCOUNTER — Ambulatory Visit: Payer: Medicare PPO

## 2023-02-08 ENCOUNTER — Other Ambulatory Visit: Payer: Self-pay | Admitting: Family Medicine

## 2023-02-08 DIAGNOSIS — I10 Essential (primary) hypertension: Secondary | ICD-10-CM

## 2023-02-09 NOTE — Telephone Encounter (Signed)
Requested Prescriptions  Pending Prescriptions Disp Refills   metoprolol succinate (TOPROL-XL) 50 MG 24 hr tablet [Pharmacy Med Name: METOPROLOL SUCCINATE ER 50 MG TAB] 90 tablet 0    Sig: TAKE 1 TABLET BY MOUTH DAILY. TAKE WITH OR IMMEDIATELY FOLLOWING A MEAL     Cardiovascular:  Beta Blockers Passed - 02/08/2023  4:37 PM      Passed - Last BP in normal range    BP Readings from Last 1 Encounters:  11/02/22 128/64         Passed - Last Heart Rate in normal range    Pulse Readings from Last 1 Encounters:  11/02/22 70         Passed - Valid encounter within last 6 months    Recent Outpatient Visits           3 months ago Primary osteoarthritis of one knee, right   [place] [facility] at [place], Earley Abide, MD   5 months ago Essential (primary) hypertension   [place] [facility] at [place], [place], MD   6 months ago Primary osteoarthritis of one knee, right   [place] [facility] [place], Earley Abide, MD   6 months ago Essential (primary) hypertension   [place] [facility] [place], [place], MD   6 months ago Acute cystitis with hematuria   [facility] [place] [facility] [place], [place], MD       Future Appointments             In 1 month Juline Patch, MD [place] [facility], [facility]   In 1 month Diamantina [place], Herbert Seta, [place]

## 2023-02-10 ENCOUNTER — Ambulatory Visit (INDEPENDENT_AMBULATORY_CARE_PROVIDER_SITE_OTHER): Payer: Medicare PPO

## 2023-02-10 ENCOUNTER — Other Ambulatory Visit: Payer: Self-pay | Admitting: Family Medicine

## 2023-02-10 VITALS — Ht 67.0 in | Wt 203.0 lb

## 2023-02-10 DIAGNOSIS — Z0000 Encounter for general adult medical examination without abnormal findings: Secondary | ICD-10-CM

## 2023-02-10 DIAGNOSIS — I10 Essential (primary) hypertension: Secondary | ICD-10-CM

## 2023-02-10 NOTE — Progress Notes (Signed)
I connected with  [name] on 02/10/23 by a audio enabled telemedicine application and verified that I am speaking with the correct person using two identifiers.  Patient Location: Home  Provider Location: Office/Clinic  I discussed the limitations of evaluation and management by telemedicine. The patient expressed understanding and agreed to proceed.  Subjective:   Timothy Hatfield is a 77 y.o. male who presents for Medicare Annual/Subsequent preventive examination.  Review of Systems     Cardiac Risk Factors include: advanced age (>53men, >56 women);dyslipidemia;hypertension;male gender     Objective:    There were no vitals filed for this visit. There is no height or weight on file to calculate BMI.     02/10/2023    3:05 PM 02/02/2022    1:29 PM 01/29/2021    2:08 PM 07/19/2020   11:02 AM 07/11/2020    7:02 AM 03/15/2018    6:12 AM 03/08/2018   11:29 AM  Advanced Directives  Does Patient Have a Medical Advance Directive? No No No No No No No  Would patient like information on creating a medical advance directive? No - Patient declined Yes (MAU/Ambulatory/Procedural Areas - Information given) Yes (MAU/Ambulatory/Procedural Areas - Information given) No - Patient declined No - Patient declined Yes (MAU/Ambulatory/Procedural Areas - Information given) Yes (MAU/Ambulatory/Procedural Areas - Information given)    Current Medications (verified) Outpatient Encounter Medications as of 02/10/2023  Medication Sig   acetaminophen (TYLENOL) 500 MG tablet Take 1,000 mg by mouth daily as needed for moderate pain.   aspirin EC 81 MG tablet Take 81 mg by mouth daily.   benazepril-hydrochlorthiazide (LOTENSIN HCT) 10-12.5 MG tablet Take 1 tablet by mouth daily.   Coenzyme Q10 (COQ-10) 100 MG CAPS Take 100 mg by mouth at bedtime.   Dutasteride-Tamsulosin HCl 0.5-0.4 MG CAPS Take 1 capsule by mouth daily.   EPINEPHrine 0.3 mg/0.3 mL IJ SOAJ injection Inject 0.3 mg into the muscle as  needed for anaphylaxis.   levofloxacin (LEVAQUIN) 750 MG tablet Take 750 mg by mouth daily.   loratadine (CLARITIN) 10 MG tablet Take 1 tablet (10 mg total) by mouth daily. For allergies   meloxicam (MOBIC) 15 MG tablet TAKE 1 TABLET BY MOUTH DAILY   metoprolol succinate (TOPROL-XL) 50 MG 24 hr tablet TAKE 1 TABLET BY MOUTH DAILY. TAKE WITH OR IMMEDIATELY FOLLOWING A MEAL   Multiple Vitamin (MULTIVITAMIN WITH MINERALS) TABS tablet Take 1 tablet by mouth at bedtime.   mupirocin ointment (BACTROBAN) 2 % Apply 1 application topically 2 (two) times daily.   nitroGLYCERIN (NITROSTAT) 0.4 MG SL tablet Place 1 tablet (0.4 mg total) under the tongue every 5 (five) minutes as needed for chest pain.   pantoprazole (PROTONIX) 40 MG tablet TAKE 1 TABLET BY MOUTH DAILY   prednisoLONE acetate (PRED FORTE) 1 % ophthalmic suspension Administer 1 drop to the right eye Three (3) times a day.   rosuvastatin (CRESTOR) 5 MG tablet Take 5 mg by mouth daily.   sertraline (ZOLOFT) 50 MG tablet Take 1 tablet (50 mg total) by mouth daily. One tablet daily   triamcinolone (NASACORT) 55 MCG/ACT AERO nasal inhaler Place 2 sprays into the nose daily. otc   No facility-administered encounter medications on file as of 02/10/2023.    Allergies (verified) Bee venom and Ciprofloxacin   History: Past Medical History:  Diagnosis Date   Allergy    BPH (benign prostatic hyperplasia)    Coronary artery disease    GERD (gastroesophageal reflux disease)    History  of kidney stones    Hyperlipidemia    Hypertension    Pneumonia    Renal disorder    kidney stones   Sleep apnea    CPAP   Past Surgical History:  Procedure Laterality Date   CARDIAC CATHETERIZATION Left 09/09/2016   Procedure: Left Heart Cath and Coronary Angiography;  Surgeon: Yolonda Kida, MD;  Location: [facility];  Service: Cardiovascular;  Laterality: Left;   COLONOSCOPY  2013   normal/ Dr Vira Agar- cleared for 10 yrs   COLONOSCOPY WITH  PROPOFOL N/A 01/03/2018   Procedure: COLONOSCOPY WITH PROPOFOL;  Surgeon: Manya Silvas, MD;  Location: [facility]COPY;  Service: Endoscopy;  Laterality: N/A;   CORONARY ARTERY BYPASS GRAFT     CYSTOSCOPY W/ RETROGRADES Bilateral 07/11/2020   Procedure: CYSTOSCOPY WITH RETROGRADE PYELOGRAM;  Surgeon: Billey Co, MD;  Location: [facility];  Service: Urology;  Laterality: Bilateral;   CYSTOSCOPY W/ RETROGRADES  07/26/2020   Procedure: CYSTOSCOPY WITH RETROGRADE PYELOGRAM;  Surgeon: Billey Co, MD;  Location: [facility];  Service: Urology;;   CYSTOSCOPY W/ URETERAL STENT REMOVAL Right 07/26/2020   Procedure: CYSTOSCOPY WITH STENT REMOVAL;  Surgeon: Billey Co, MD;  Location: [facility];  Service: Urology;  Laterality: Right;   CYSTOSCOPY/URETEROSCOPY/HOLMIUM LASER/STENT PLACEMENT Bilateral 07/11/2020   Procedure: CYSTOSCOPY/URETEROSCOPY/HOLMIUM LASER/STENT PLACEMENT;  Surgeon: Billey Co, MD;  Location: [facility];  Service: Urology;  Laterality: Bilateral;   CYSTOSCOPY/URETEROSCOPY/HOLMIUM LASER/STENT PLACEMENT Left 07/26/2020   Procedure: CYSTOSCOPY/URETEROSCOPY/HOLMIUM LASER/STENT EXCHANGE;  Surgeon: Billey Co, MD;  Location: [facility];  Service: Urology;  Laterality: Left;   ESOPHAGOGASTRODUODENOSCOPY (EGD) WITH PROPOFOL N/A 01/03/2018   Procedure: ESOPHAGOGASTRODUODENOSCOPY (EGD) WITH PROPOFOL;  Surgeon: Manya Silvas, MD;  Location: [facility] ENDOSCOPY;  Service: Endoscopy;  Laterality: N/A;   EXTRACORPOREAL SHOCK WAVE LITHOTRIPSY Left 07/11/2020   Procedure: EXTRACORPOREAL SHOCK WAVE LITHOTRIPSY (ESWL);  Surgeon: Billey Co, MD;  Location: [facility];  Service: Urology;  Laterality: Left;   EYE SURGERY     HERNIA REPAIR     IR NEPHROSTOMY PLACEMENT RIGHT  03/15/2018   kidney stones     lithotripsy   LITHOTRIPSY     NEPHROLITHOTOMY Right 03/15/2018   Procedure: NEPHROLITHOTOMY PERCUTANEOUS;  Surgeon: Abbie Sons, MD;  Location: [facility];  Service: Urology;  Laterality:  Right;   STONE EXTRACTION WITH BASKET  07/11/2020   Procedure: BLADDER STONE EXTRACTION;  Surgeon: Billey Co, MD;  Location: [facility];  Service: Urology;;   UPPER GI ENDOSCOPY  2013   Family History  Problem Relation Age of Onset   Arrhythmia Mother    Coronary artery disease Father    Diabetes Father    Hyperlipidemia Brother    Social History   Socioeconomic History   Marital status: Married    Spouse name: Not on file   Number of children: Not on file   Years of education: Not on file   Highest education level: Not on file  Occupational History   Not on file  Tobacco Use   Smoking status: Never    Passive exposure: Never   Smokeless tobacco: Never  Vaping Use   Vaping Use: Never used  Substance and Sexual Activity   Alcohol use: No    Alcohol/week: 0.0 standard drinks of alcohol   Drug use: No   Sexual activity: Yes  Other Topics Concern   Not on file  Social History Narrative   Not on file   Social Determinants of Health  Financial Resource Strain: Low Risk  (02/10/2023)   Overall Financial Resource Strain (CARDIA)    Difficulty of Paying Living Expenses: Not hard at all  Food Insecurity: No Food Insecurity (02/10/2023)   Hunger Vital Sign    Worried About Running Out of Food in the Last Year: Never true    Ran Out of Food in the Last Year: Never true  Transportation Needs: No Transportation Needs (02/10/2023)   PRAPARE - Hydrologist (Medical): No    Lack of Transportation (Non-Medical): No  Physical Activity: Insufficiently Active (02/10/2023)   Exercise Vital Sign    Days of Exercise per Week: 3 days    Minutes of Exercise per Session: 30 min  Stress: No Stress Concern Present (02/10/2023)   [place]    Feeling of Stress : Not at all  Social Connections: Socially Isolated (02/10/2023)   Social Connection and Isolation Panel [NHANES]    Frequency of  Communication with Friends and Family: More than three times a week    Frequency of Social Gatherings with Friends and Family: Twice a week    Attends Religious Services: Never    Marine scientist or Organizations: No    Attends Archivist Meetings: Never    Marital Status: Widowed    Tobacco Counseling Counseling given: Not Answered   Clinical Intake:  Pre-visit preparation completed: Yes  Pain : No/denies pain     Nutritional Risks: None Diabetes: No  How often do you need to have someone help you when you read instructions, pamphlets, or other written materials from your doctor or pharmacy?: 1 - Never  Diabetic?no  Interpreter Needed?: No  Information entered by :: Kirke Shaggy, LPN   Activities of Daily Living    02/10/2023    3:07 PM 03/17/2022    2:06 PM  In your present state of health, do you have any difficulty performing the following activities:  Hearing? 0 0  Vision? 0 0  Difficulty concentrating or making decisions? 0 0  Walking or climbing stairs? 0 0  Dressing or bathing? 0 0  Doing errands, shopping? 0   Preparing Food and eating ? N   Using the Toilet? N   In the past six months, have you accidently leaked urine? N   Do you have problems with loss of bowel control? N   Managing your Medications? N   Managing your Finances? N   Housekeeping or managing your Housekeeping? N     Patient Care Team: Juline Patch, MD as PCP - General (Family Medicine)  Indicate any recent Medical Services you may have received from other than [facility] providers in the past year (date may be approximate).     Assessment:   This is a routine wellness examination for Cono.  Hearing/Vision screen Hearing Screening - Comments:: No aids Vision Screening - Comments:: Wears glasses- Dr.Dingledein   Dietary issues and exercise activities discussed: Current Exercise Habits: Home exercise routine, Type of exercise: walking, Time (Minutes): 30,  Frequency (Times/Week): 3, Weekly Exercise (Minutes/Week): 90, Intensity: Mild   Goals Addressed             This Visit's Progress    DIET - EAT MORE FRUITS AND VEGETABLES         Depression Screen    02/10/2023    3:02 PM 09/10/2022    1:55 PM 08/07/2022    2:13 PM 07/24/2022  3:31 PM 07/14/2022    3:35 PM 03/17/2022    2:05 PM 03/10/2022    2:55 PM  PHQ 2/9 Scores  PHQ - 2 Score 0 0 0 0 0 0 0  PHQ- 9 Score 0 0 0 0 2 0 0    Fall Risk    02/10/2023    3:06 PM 09/10/2022    1:55 PM 08/07/2022    2:13 PM 07/24/2022    3:30 PM 07/14/2022    3:36 PM  Fall Risk   Falls in the past year? 1 0 0 0 0  Number falls in past yr: 0 0 0 0   Injury with Fall? 0 0 0 0 0  Risk for fall due to : History of fall(s)  No Fall Risks No Fall Risks   Follow up Falls prevention discussed;Falls evaluation completed Falls evaluation completed Falls evaluation completed Falls evaluation completed     FALL RISK PREVENTION PERTAINING TO THE HOME:  Any stairs in or around the home? Yes  If so, are there any without handrails? No  Home free of loose throw rugs in walkways, pet beds, electrical cords, etc? Yes  Adequate lighting in your home to reduce risk of falls? Yes   ASSISTIVE DEVICES UTILIZED TO PREVENT FALLS:  Life alert? No  Use of a cane, walker or w/c? No  Grab bars in the bathroom? Yes  Shower chair or bench in shower? Yes  Elevated toilet seat or a handicapped toilet? Yes   Cognitive Function:        02/10/2023    3:12 PM  6CIT Screen  What Year? 0 points  What month? 0 points  What time? 0 points  Count back from 20 0 points  Months in reverse 2 points  Repeat phrase 4 points  Total Score 6 points    Immunizations Immunization History  Administered Date(s) Administered   Fluad Quad(high Dose 65+) 08/07/2019, 09/05/2020, 09/09/2021, 09/10/2022   Influenza, High Dose Seasonal PF 09/28/2017, 10/03/2018   Influenza,inj,Quad PF,6+ Mos 10/25/2014, 08/02/2015, 08/17/2016    Influenza-Unspecified 10/25/2014, 08/02/2015, 08/23/2016, 09/28/2017, 10/03/2018   Pneumococcal Conjugate-13 01/23/2020   Pneumococcal Polysaccharide-23 09/28/2017    TDAP status: Due, Education has been provided regarding the importance of this vaccine. Advised may receive this vaccine at local pharmacy or Health Dept. Aware to provide a copy of the vaccination record if obtained from local pharmacy or Health Dept. Verbalized acceptance and understanding.  Flu Vaccine status: Up to date  Pneumococcal vaccine status: Up to date  Covid-19 vaccine status: Declined, Education has been provided regarding the importance of this vaccine but patient still declined. Advised may receive this vaccine at local pharmacy or Health Dept.or vaccine clinic. Aware to provide a copy of the vaccination record if obtained from local pharmacy or Health Dept. Verbalized acceptance and understanding.  Qualifies for Shingles Vaccine? Yes   Zostavax completed No   Shingrix Completed?: No.    Education has been provided regarding the importance of this vaccine. Patient has been advised to call insurance company to determine out of pocket expense if they have not yet received this vaccine. Advised may also receive vaccine at local pharmacy or Health Dept. Verbalized acceptance and understanding.  Screening Tests Health Maintenance  Topic Date Due   DTaP/Tdap/Td (1 - Tdap) Never done   Zoster Vaccines- Shingrix (1 of 2) Never done   Medicare Annual Wellness (AWV)  02/10/2024   Pneumonia Vaccine 69+ Years old  Completed   INFLUENZA VACCINE  Completed   HPV VACCINES  Aged Out   COLONOSCOPY (Pts 45-66yrs Insurance coverage will need to be confirmed)  Discontinued   COVID-19 Vaccine  Discontinued   Hepatitis C Screening  Discontinued    Health Maintenance  Health Maintenance Due  Topic Date Due   DTaP/Tdap/Td (1 - Tdap) Never done   Zoster Vaccines- Shingrix (1 of 2) Never done    Colorectal cancer screening:  No longer required.   Lung Cancer Screening: (Low Dose CT Chest recommended if Age 38-80 years, 30 pack-year currently smoking OR have quit w/in 15years.) does not qualify.   Additional Screening:  Hepatitis C Screening: does qualify; Completed no  Vision Screening: Recommended annual ophthalmology exams for early detection of glaucoma and other disorders of the eye. Is the patient up to date with their annual eye exam?  Yes  Who is the provider or what is the name of the office in which the patient attends annual eye exams? Dr.Dingledein If pt is not established with a provider, would they like to be referred to a provider to establish care? No .   Dental Screening: Recommended annual dental exams for proper oral hygiene  Community Resource Referral / Chronic Care Management: CRR required this visit?  No   CCM required this visit?  No      Plan:     I have personally reviewed and noted the following in the patient's chart:   Medical and social history Use of alcohol, tobacco or illicit drugs  Current medications and supplements including opioid prescriptions. Patient is not currently taking opioid prescriptions. Functional ability and status Nutritional status Physical activity Advanced directives List of other physicians Hospitalizations, surgeries, and ER visits in previous 12 months Vitals Screenings to include cognitive, depression, and falls Referrals and appointments  In addition, I have reviewed and discussed with patient certain preventive protocols, quality metrics, and best practice recommendations. A written personalized care plan for preventive services as well as general preventive health recommendations were provided to patient.     Dionisio David, LPN   [id number]   Nurse Notes: none

## 2023-02-10 NOTE — Patient Instructions (Signed)
Timothy Hatfield , Thank you for taking time to come for your Medicare Wellness Visit. I appreciate your ongoing commitment to your health goals. Please review the following plan we discussed and let me know if I can assist you in the future.   These are the goals we discussed:  Goals      DIET - EAT MORE FRUITS AND VEGETABLES        This is a list of the screening recommended for you and due dates:  Health Maintenance  Topic Date Due   DTaP/Tdap/Td vaccine (1 - Tdap) Never done   Zoster (Shingles) Vaccine (1 of 2) Never done   Medicare Annual Wellness Visit  02/10/2024   Pneumonia Vaccine  Completed   Flu Shot  Completed   HPV Vaccine  Aged Out   Colon Cancer Screening  Discontinued   COVID-19 Vaccine  Discontinued   Hepatitis C Screening: USPSTF Recommendation to screen - Ages 66-79 yo.  Discontinued    Advanced directives: no  Conditions/risks identified: none  Next appointment: Follow up in one year for your annual wellness visit. 02/16/24 @ 1:30 pm by phone  Preventive Care 65 Years and Older, Male  Preventive care refers to lifestyle choices and visits with your health care provider that can promote health and wellness. What does preventive care include? A yearly physical exam. This is also called an annual well check. Dental exams once or twice a year. Routine eye exams. Ask your health care provider how often you should have your eyes checked. Personal lifestyle choices, including: Daily care of your teeth and gums. Regular physical activity. Eating a healthy diet. Avoiding tobacco and drug use. Limiting alcohol use. Practicing safe sex. Taking low doses of aspirin every day. Taking vitamin and mineral supplements as recommended by your health care provider. What happens during an annual well check? The services and screenings done by your health care provider during your annual well check will depend on your age, overall health, lifestyle risk factors, and family  history of disease. Counseling  Your health care provider may ask you questions about your: Alcohol use. Tobacco use. Drug use. Emotional well-being. Home and relationship well-being. Sexual activity. Eating habits. History of falls. Memory and ability to understand (cognition). Work and work Statistician. Screening  You may have the following tests or measurements: Height, weight, and BMI. Blood pressure. Lipid and cholesterol levels. These may be checked every 5 years, or more frequently if you are over 29 years old. Skin check. Lung cancer screening. You may have this screening every year starting at age 32 if you have a 30-pack-year history of smoking and currently smoke or have quit within the past 15 years. Fecal occult blood test (FOBT) of the stool. You may have this test every year starting at age 71. Flexible sigmoidoscopy or colonoscopy. You may have a sigmoidoscopy every 5 years or a colonoscopy every 10 years starting at age 16. Prostate cancer screening. Recommendations will vary depending on your family history and other risks. Hepatitis C blood test. Hepatitis B blood test. Sexually transmitted disease (STD) testing. Diabetes screening. This is done by checking your blood sugar (glucose) after you have not eaten for a while (fasting). You may have this done every 1-3 years. Abdominal aortic aneurysm (AAA) screening. You may need this if you are a current or former smoker. Osteoporosis. You may be screened starting at age 45 if you are at high risk. Talk with your health care provider about your test results,  treatment options, and if necessary, the need for more tests. Vaccines  Your health care provider may recommend certain vaccines, such as: Influenza vaccine. This is recommended every year. Tetanus, diphtheria, and acellular pertussis (Tdap, Td) vaccine. You may need a Td booster every 10 years. Zoster vaccine. You may need this after age 18. Pneumococcal  13-valent conjugate (PCV13) vaccine. One dose is recommended after age 62. Pneumococcal polysaccharide (PPSV23) vaccine. One dose is recommended after age 76. Talk to your health care provider about which screenings and vaccines you need and how often you need them. This information is not intended to replace advice given to you by your health care provider. Make sure you discuss any questions you have with your health care provider. Document Released: 12/06/2015 Document Revised: 07/29/2016 Document Reviewed: 09/10/2015 Elsevier Interactive Patient Education  2017 [place] Prevention in the Home Falls can cause injuries. They can happen to people of all ages. There are many things you can do to make your home safe and to help prevent falls. What can I do on the outside of my home? Regularly fix the edges of walkways and driveways and fix any cracks. Remove anything that might make you trip as you walk through a door, such as a raised step or threshold. Trim any bushes or trees on the path to your home. Use bright outdoor lighting. Clear any walking paths of anything that might make someone trip, such as rocks or tools. Regularly check to see if handrails are loose or broken. Make sure that both sides of any steps have handrails. Any raised decks and porches should have guardrails on the edges. Have any leaves, snow, or ice cleared regularly. Use sand or salt on walking paths during winter. Clean up any spills in your garage right away. This includes oil or grease spills. What can I do in the bathroom? Use night lights. Install grab bars by the toilet and in the tub and shower. Do not use towel bars as grab bars. Use non-skid mats or decals in the tub or shower. If you need to sit down in the shower, use a plastic, non-slip stool. Keep the floor dry. Clean up any water that spills on the floor as soon as it happens. Remove soap buildup in the tub or shower regularly. Attach  bath mats securely with double-sided non-slip rug tape. Do not have throw rugs and other things on the floor that can make you trip. What can I do in the bedroom? Use night lights. Make sure that you have a light by your bed that is easy to reach. Do not use any sheets or blankets that are too big for your bed. They should not hang down onto the floor. Have a firm chair that has side arms. You can use this for support while you get dressed. Do not have throw rugs and other things on the floor that can make you trip. What can I do in the kitchen? Clean up any spills right away. Avoid walking on wet floors. Keep items that you use a lot in easy-to-reach places. If you need to reach something above you, use a strong step stool that has a grab bar. Keep electrical cords out of the way. Do not use floor polish or wax that makes floors slippery. If you must use wax, use non-skid floor wax. Do not have throw rugs and other things on the floor that can make you trip. What can I do with my stairs? Do  not leave any items on the stairs. Make sure that there are handrails on both sides of the stairs and use them. Fix handrails that are broken or loose. Make sure that handrails are as long as the stairways. Check any carpeting to make sure that it is firmly attached to the stairs. Fix any carpet that is loose or worn. Avoid having throw rugs at the top or bottom of the stairs. If you do have throw rugs, attach them to the floor with carpet tape. Make sure that you have a light switch at the top of the stairs and the bottom of the stairs. If you do not have them, ask someone to add them for you. What else can I do to help prevent falls? Wear shoes that: Do not have high heels. Have rubber bottoms. Are comfortable and fit you well. Are closed at the toe. Do not wear sandals. If you use a stepladder: Make sure that it is fully opened. Do not climb a closed stepladder. Make sure that both sides of the  stepladder are locked into place. Ask someone to hold it for you, if possible. Clearly mark and make sure that you can see: Any grab bars or handrails. First and last steps. Where the edge of each step is. Use tools that help you move around (mobility aids) if they are needed. These include: Canes. Walkers. Scooters. Crutches. Turn on the lights when you go into a dark area. Replace any light bulbs as soon as they burn out. Set up your furniture so you have a clear path. Avoid moving your furniture around. If any of your floors are uneven, fix them. If there are any pets around you, be aware of where they are. Review your medicines with your doctor. Some medicines can make you feel dizzy. This can increase your chance of falling. Ask your doctor what other things that you can do to help prevent falls. This information is not intended to replace advice given to you by your health care provider. Make sure you discuss any questions you have with your health care provider. Document Released: 09/05/2009 Document Revised: 04/16/2016 Document Reviewed: 12/14/2014 Elsevier Interactive Patient Education  2017 [employer].

## 2023-02-11 NOTE — Telephone Encounter (Signed)
Rx- [id number] request Requested Prescriptions  Pending Prescriptions Disp Refills   metoprolol succinate (TOPROL-XL) 50 MG 24 hr tablet [Pharmacy Med Name: METOPROLOL SUCCINATE ER 50 MG TAB] 90 tablet 0    Sig: TAKE 1 TABLET BY MOUTH DAILY. TAKE WITH OR IMMEDIATELY FOLLOWING A MEAL     Cardiovascular:  Beta Blockers Passed - 02/10/2023 12:10 PM      Passed - Last BP in normal range    BP Readings from Last 1 Encounters:  11/02/22 128/64         Passed - Last Heart Rate in normal range    Pulse Readings from Last 1 Encounters:  11/02/22 70         Passed - Valid encounter within last 6 months    Recent Outpatient Visits           3 months ago Primary osteoarthritis of one knee, right   [facility] [place], Earley Abide, MD   5 months ago Essential (primary) hypertension   [facility] [place], [place], MD   6 months ago Primary osteoarthritis of one knee, right   [facility] [place], Earley Abide, MD   6 months ago Essential (primary) hypertension   [facility] [place], [place], MD   6 months ago Acute cystitis with hematuria   [facility] [place], [place], MD       Future Appointments             In 4 weeks Juline Patch, MD [place] [facility], [facility]   In 1 month Diamant[facility], Herbert Seta, [place]

## 2023-02-23 ENCOUNTER — Other Ambulatory Visit: Payer: Self-pay | Admitting: Urology

## 2023-02-23 ENCOUNTER — Telehealth: Payer: Self-pay | Admitting: Family Medicine

## 2023-02-23 NOTE — Telephone Encounter (Signed)
Copied from [place] [id number]. Topic: Appointment Scheduling - Scheduling Inquiry for Clinic >> Feb 23, 2023 10:57 AM Timothy Hatfield wrote: Reason for CRM: The patient has called to follow up on their previous request for an injection in their right knee  The patient would also like to have an MRI completed of the knee  Please contact the patient further when possible

## 2023-03-08 NOTE — Telephone Encounter (Signed)
Pt called saying he is still waiting for an appt with Dr. Ashley Royalty for an injection in his knee.

## 2023-03-12 ENCOUNTER — Ambulatory Visit: Payer: Medicare PPO | Admitting: Family Medicine

## 2023-03-12 NOTE — Telephone Encounter (Signed)
Has been ordered, will call when it comes in.

## 2023-03-15 ENCOUNTER — Ambulatory Visit: Payer: Medicare PPO | Admitting: Family Medicine

## 2023-03-15 ENCOUNTER — Encounter: Payer: Self-pay | Admitting: Family Medicine

## 2023-03-15 VITALS — BP 124/76 | HR 70 | Ht 67.0 in | Wt 204.0 lb

## 2023-03-15 DIAGNOSIS — E782 Mixed hyperlipidemia: Secondary | ICD-10-CM | POA: Diagnosis not present

## 2023-03-15 DIAGNOSIS — I10 Essential (primary) hypertension: Secondary | ICD-10-CM

## 2023-03-15 DIAGNOSIS — F329 Major depressive disorder, single episode, unspecified: Secondary | ICD-10-CM | POA: Diagnosis not present

## 2023-03-15 DIAGNOSIS — J301 Allergic rhinitis due to pollen: Secondary | ICD-10-CM | POA: Diagnosis not present

## 2023-03-15 DIAGNOSIS — K2270 Barrett's esophagus without dysplasia: Secondary | ICD-10-CM | POA: Diagnosis not present

## 2023-03-15 MED ORDER — METOPROLOL SUCCINATE ER 50 MG PO TB24: 50.0000 mg | ORAL_TABLET | Freq: Every day | ORAL | 1 refills | Status: DC

## 2023-03-15 MED ORDER — LORATADINE 10 MG PO TABS: 10.0000 mg | ORAL_TABLET | Freq: Every day | ORAL | 11 refills | Status: DC

## 2023-03-15 MED ORDER — PANTOPRAZOLE SODIUM 40 MG PO TBEC: 40.0000 mg | DELAYED_RELEASE_TABLET | Freq: Every day | ORAL | 1 refills | Status: DC

## 2023-03-15 MED ORDER — BENAZEPRIL-HYDROCHLOROTHIAZIDE 10-12.5 MG PO TABS: 1.0000 | ORAL_TABLET | Freq: Every day | ORAL | 1 refills | Status: DC

## 2023-03-15 NOTE — Progress Notes (Signed)
Date:  03/15/2023   Name:  Timothy Hatfield   DOB:  1946-02-21   MRN:  [id number]   Chief Complaint: Gastroesophageal Reflux, Hypertension, and Allergic Rhinitis   Gastroesophageal Reflux He reports no abdominal pain, no chest pain, no choking, no coughing, no dysphagia, no globus sensation, no heartburn, no nausea, no sore throat or no wheezing. This is a chronic problem. The current episode started more than 1 year ago. The problem has been gradually improving. The symptoms are aggravated by certain foods. There are no known risk factors. He has tried a PPI for the symptoms. The treatment provided moderate relief.  Hypertension This is a chronic problem. The current episode started more than 1 year ago. The problem has been gradually improving since onset. The problem is controlled. Pertinent negatives include no blurred vision, chest pain, headaches, malaise/fatigue, orthopnea, palpitations, peripheral edema, PND or shortness of breath. There are no associated agents to hypertension. There are no known risk factors for coronary artery disease. Past treatments include ACE inhibitors, diuretics and beta blockers. The current treatment provides moderate improvement. There are no compliance problems.  There is no history of angina, CAD/MI or CVA. There is no history of chronic renal disease, a hypertension causing med or renovascular disease.    Lab Results  Component Value Date   NA 143 07/29/2022   K 5.0 07/29/2022   CO2 26 07/29/2022   GLUCOSE 125 (H) 07/29/2022   BUN 12 07/29/2022   CREATININE 1.07 07/29/2022   CALCIUM 9.3 07/29/2022   EGFR 72 07/29/2022   GFRNONAA 42 (L) 07/24/2020   Lab Results  Component Value Date   CHOL 170 03/10/2022   HDL 53 03/10/2022   LDLCALC 96 03/10/2022   TRIG 118 03/10/2022   CHOLHDL 2.6 10/03/2018   No results found for: "TSH" No results found for: "HGBA1C" Lab Results  Component Value Date   WBC 7.1 07/29/2022   HGB 12.6 (L) 07/29/2022    HCT 39.6 07/29/2022   MCV 91 07/29/2022   PLT 273 07/29/2022   Lab Results  Component Value Date   ALT 24 03/27/2020   AST 25 03/27/2020   ALKPHOS 64 03/27/2020   BILITOT 0.9 03/27/2020   No results found for: "25OHVITD2", "25OHVITD3", "VD25OH"   Review of Systems  Constitutional:  Negative for malaise/fatigue and unexpected weight change.  HENT:  Negative for congestion, rhinorrhea, sinus pressure, sore throat and trouble swallowing.   Eyes:  Negative for blurred vision.  Respiratory:  Negative for cough, choking, shortness of breath and wheezing.   Cardiovascular:  Negative for chest pain, palpitations, orthopnea and PND.  Gastrointestinal:  Negative for abdominal pain, blood in stool, constipation, dysphagia, heartburn and nausea.  Endocrine: Negative for polydipsia and polyuria.  Genitourinary:  Negative for difficulty urinating.  Allergic/Immunologic: Negative for environmental allergies and immunocompromised state.  Neurological:  Negative for dizziness, light-headedness, numbness and headaches.  Hematological:  Negative for adenopathy. Does not bruise/bleed easily.    Patient Active Problem List   Diagnosis Date Noted   Primary osteoarthritis of one knee, right 03/17/2022   Benign prostatic hyperplasia with lower urinary tract symptoms 01/12/2020   GERD without esophagitis 03/25/2018   Moderate episode of recurrent major depressive disorder 03/25/2018   Acute calcific periarthritis 06/18/2015   Allergic rhinitis 06/18/2015   Arthritis 06/18/2015   Essential (primary) hypertension 06/18/2015   Barrett esophagus 06/18/2015   HLD (hyperlipidemia) 06/18/2015   External hemorrhoid 06/18/2015   Gastroenteritis 06/18/2015   Bilateral nephrolithiasis  05/15/2014    Allergies  Allergen Reactions   Bee Venom Anaphylaxis   Ciprofloxacin Itching    Past Surgical History:  Procedure Laterality Date   CARDIAC CATHETERIZATION Left 09/09/2016   Procedure: Left Heart Cath  and Coronary Angiography;  Surgeon: Alwyn Pea, MD;  Location: [facility] INVASIVE CV LAB;  Service: Cardiovascular;  Laterality: Left;   COLONOSCOPY  2013   normal/ Dr Mechele Collin- cleared for 10 yrs   COLONOSCOPY WITH PROPOFOL N/A 01/03/2018   Procedure: COLONOSCOPY WITH PROPOFOL;  Surgeon: Scot Jun, MD;  Location: [facility] ENDOSCOPY;  Service: Endoscopy;  Laterality: N/A;   CORONARY ARTERY BYPASS GRAFT     CYSTOSCOPY W/ RETROGRADES Bilateral 07/11/2020   Procedure: CYSTOSCOPY WITH RETROGRADE PYELOGRAM;  Surgeon: Sondra Come, MD;  Location: [facility] ORS;  Service: Urology;  Laterality: Bilateral;   CYSTOSCOPY W/ RETROGRADES  07/26/2020   Procedure: CYSTOSCOPY WITH RETROGRADE PYELOGRAM;  Surgeon: Sondra Come, MD;  Location: [facility] ORS;  Service: Urology;;   CYSTOSCOPY W/ URETERAL STENT REMOVAL Right 07/26/2020   Procedure: CYSTOSCOPY WITH STENT REMOVAL;  Surgeon: Sondra Come, MD;  Location: [facility] ORS;  Service: Urology;  Laterality: Right;   CYSTOSCOPY/URETEROSCOPY/HOLMIUM LASER/STENT PLACEMENT Bilateral 07/11/2020   Procedure: CYSTOSCOPY/URETEROSCOPY/HOLMIUM LASER/STENT PLACEMENT;  Surgeon: Sondra Come, MD;  Location: [facility] ORS;  Service: Urology;  Laterality: Bilateral;   CYSTOSCOPY/URETEROSCOPY/HOLMIUM LASER/STENT PLACEMENT Left 07/26/2020   Procedure: CYSTOSCOPY/URETEROSCOPY/HOLMIUM LASER/STENT EXCHANGE;  Surgeon: Sondra Come, MD;  Location: [facility] ORS;  Service: Urology;  Laterality: Left;   ESOPHAGOGASTRODUODENOSCOPY (EGD) WITH PROPOFOL N/A 01/03/2018   Procedure: ESOPHAGOGASTRODUODENOSCOPY (EGD) WITH PROPOFOL;  Surgeon: Scot Jun, MD;  Location: [facility] ENDOSCOPY;  Service: Endoscopy;  Laterality: N/A;   EXTRACORPOREAL SHOCK WAVE LITHOTRIPSY Left 07/11/2020   Procedure: EXTRACORPOREAL SHOCK WAVE LITHOTRIPSY (ESWL);  Surgeon: Sondra Come, MD;  Location: [facility] ORS;  Service: Urology;  Laterality: Left;   EYE SURGERY     HERNIA REPAIR     IR NEPHROSTOMY PLACEMENT RIGHT   03/15/2018   kidney stones     lithotripsy   LITHOTRIPSY     NEPHROLITHOTOMY Right 03/15/2018   Procedure: NEPHROLITHOTOMY PERCUTANEOUS;  Surgeon: Riki Altes, MD;  Location: [facility] ORS;  Service: Urology;  Laterality: Right;   STONE EXTRACTION WITH BASKET  07/11/2020   Procedure: BLADDER STONE EXTRACTION;  Surgeon: Sondra Come, MD;  Location: [facility] ORS;  Service: Urology;;   UPPER GI ENDOSCOPY  2013    Social History   Tobacco Use   Smoking status: Never    Passive exposure: Never   Smokeless tobacco: Never  Vaping Use   Vaping Use: Never used  Substance Use Topics   Alcohol use: No    Alcohol/week: 0.0 standard drinks of alcohol   Drug use: No     Medication list has been reviewed and updated.  Current Meds  Medication Sig   acetaminophen (TYLENOL) 500 MG tablet Take 1,000 mg by mouth daily as needed for moderate pain.   aspirin EC 81 MG tablet Take 81 mg by mouth daily.   benazepril-hydrochlorthiazide (LOTENSIN HCT) 10-12.5 MG tablet Take 1 tablet by mouth daily.   Coenzyme Q10 (COQ-10) 100 MG CAPS Take 100 mg by mouth at bedtime.   Dutasteride-Tamsulosin HCl 0.5-0.4 MG CAPS TAKE 1 CAPSULE BY MOUTH DAILY.   EPINEPHrine 0.3 mg/0.3 mL IJ SOAJ injection Inject 0.3 mg into the muscle as needed for anaphylaxis.   loratadine (CLARITIN) 10 MG tablet Take 1 tablet (10 mg total) by mouth daily. For  allergies   meloxicam (MOBIC) 15 MG tablet TAKE 1 TABLET BY MOUTH DAILY   metoprolol succinate (TOPROL-XL) 50 MG 24 hr tablet TAKE 1 TABLET BY MOUTH DAILY. TAKE WITH OR IMMEDIATELY FOLLOWING A MEAL   Multiple Vitamin (MULTIVITAMIN WITH MINERALS) TABS tablet Take 1 tablet by mouth at bedtime.   mupirocin ointment (BACTROBAN) 2 % Apply 1 application topically 2 (two) times daily.   nitroGLYCERIN (NITROSTAT) 0.4 MG SL tablet Place 1 tablet (0.4 mg total) under the tongue every 5 (five) minutes as needed for chest pain.   pantoprazole (PROTONIX) 40 MG tablet TAKE 1 TABLET BY MOUTH DAILY    rosuvastatin (CRESTOR) 5 MG tablet Take 5 mg by mouth daily.   sertraline (ZOLOFT) 50 MG tablet Take 1 tablet (50 mg total) by mouth daily. One tablet daily   triamcinolone (NASACORT) 55 MCG/ACT AERO nasal inhaler Place 2 sprays into the nose daily. otc       03/15/2023    3:01 PM 09/10/2022    1:56 PM 08/07/2022    2:13 PM 07/24/2022    3:31 PM  GAD 7 : Generalized Anxiety Score  Nervous, Anxious, on Edge 0 0 0 0  Control/stop worrying 0 0 0 0  Worry too much - different things 0 0 0 0  Trouble relaxing 0 0 0 0  Restless 0 0 0 0  Easily annoyed or irritable 0 0 0 0  Afraid - awful might happen 0 0 0 0  Total GAD 7 Score 0 0 0 0  Anxiety Difficulty Not difficult at all Not difficult at all Not difficult at all Not difficult at all       03/15/2023    3:01 PM 02/10/2023    3:02 PM 09/10/2022    1:55 PM  Depression screen PHQ 2/9  Decreased Interest 0 0 0  Down, Depressed, Hopeless 0 0 0  PHQ - 2 Score 0 0 0  Altered sleeping 0 0 0  Tired, decreased energy 0 0 0  Change in appetite 0 0 0  Feeling bad or failure about yourself  0 0 0  Trouble concentrating 0 0 0  Moving slowly or fidgety/restless 0 0 0  Suicidal thoughts 0 0 0  PHQ-9 Score 0 0 0  Difficult doing work/chores Not difficult at all Not difficult at all Not difficult at all    BP Readings from Last 3 Encounters:  03/15/23 124/76  11/02/22 128/64  09/10/22 (!) 146/76    Physical Exam Vitals and nursing note reviewed.  HENT:     Head: Normocephalic.     Right Ear: Tympanic membrane and external ear normal.     Left Ear: Tympanic membrane and external ear normal.     Nose: Nose normal.     Mouth/Throat:     Mouth: Mucous membranes are moist.  Eyes:     General: No scleral icterus.       Right eye: No discharge.        Left eye: No discharge.     Conjunctiva/sclera: Conjunctivae normal.     Pupils: Pupils are equal, round, and reactive to light.  Neck:     Thyroid: No thyromegaly.     Vascular: No  JVD.     Trachea: No tracheal deviation.  Cardiovascular:     Rate and Rhythm: Normal rate and regular rhythm.     Heart sounds: Normal heart sounds. No murmur heard.    No friction rub. No gallop.  Pulmonary:  Effort: No respiratory distress.     Breath sounds: Normal breath sounds. No wheezing, rhonchi or rales.  Abdominal:     General: Bowel sounds are normal.     Palpations: Abdomen is soft. There is no mass.     Tenderness: There is no abdominal tenderness. There is no guarding or rebound.  Musculoskeletal:        General: No tenderness. Normal range of motion.     Cervical back: Normal range of motion and neck supple.  Lymphadenopathy:     Cervical: No cervical adenopathy.  Skin:    General: Skin is warm.     Findings: No rash.  Neurological:     Mental Status: He is alert and oriented to person, place, and time.     Cranial Nerves: No cranial nerve deficit.     Deep Tendon Reflexes: Reflexes are normal and symmetric.     Wt Readings from Last 3 Encounters:  03/15/23 204 lb (92.5 kg)  02/10/23 203 lb (92.1 kg)  11/02/22 203 lb (92.1 kg)    BP 124/76   Pulse 70   Ht 5\' 7"  (1.702 m)   Wt 204 lb (92.5 kg)   SpO2 96%   BMI 31.95 kg/m   Assessment and Plan:  1. Essential (primary) hypertension Chronic.  Controlled.  Stable.  Blood pressure 124/76.  Asymptomatic.  Tolerating medications well.  Continue benazepril hydrochlorothiazide 10-12.5 mg once a day and metoprolol XL 50 mg once a day.  Will check CMP for electrolytes and GFR.  Will recheck patient in 6 months. - benazepril-hydrochlorthiazide (LOTENSIN HCT) 10-12.5 MG tablet; Take 1 tablet by mouth daily.  Dispense: 90 tablet; Refill: 1 - metoprolol succinate (TOPROL-XL) 50 MG 24 hr tablet; Take 1 tablet (50 mg total) by mouth daily. TAKE WITH OR IMMEDIATELY FOLLOWING A MEAL.  Dispense: 90 tablet; Refill: 1 - Comprehensive Metabolic Panel (CMET)  2. Seasonal allergic rhinitis due to pollen Chronic.   Controlled.  Stable.  Continue loratadine 10 mg daily. - loratadine (CLARITIN) 10 MG tablet; Take 1 tablet (10 mg total) by mouth daily. For allergies  Dispense: 30 tablet; Refill: 11  3. Barrett's esophagus without dysplasia Chronic.  Controlled.  Stable.  No issues with dysphagia or serious reflux.  We will continue pantoprazole 40 mg once a day. - pantoprazole (PROTONIX) 40 MG tablet; Take 1 tablet (40 mg total) by mouth daily.  Dispense: 90 tablet; Refill: 1  4. Reactive depression Chronic.  Controlled.  Stable.  PHQ 0 GAD score 0 currently is not taking sertraline and will continue off medication then we will resume on an as-needed basis.  5. Mixed hyperlipidemia Chronic.  Diet controlled.  Stable.  Continue lipid panel evaluation for current LDL status. - Lipid Panel With LDL/HDL Ratio    Elizabeth Sauer, MD

## 2023-03-16 LAB — LIPID PANEL WITH LDL/HDL RATIO
Cholesterol, Total: 138 mg/dL (ref 100–199)
HDL: 43 mg/dL (ref >39)
LDL Chol Calc (NIH): 60 mg/dL (ref 0–99)
LDL/HDL Ratio: 1.4 ratio (ref 0.0–3.6)
Triglycerides: 219 mg/dL — ABNORMAL HIGH (ref 0–149)
VLDL Cholesterol Cal: 35 mg/dL (ref 5–40)

## 2023-03-16 LAB — COMPREHENSIVE METABOLIC PANEL
ALT: 11 IU/L (ref 0–44)
AST: 19 IU/L (ref 0–40)
Albumin/Globulin Ratio: 1.6 (ref 1.2–2.2)
Albumin: 4.3 g/dL (ref 3.8–4.8)
Alkaline Phosphatase: 57 IU/L (ref 44–121)
BUN/Creatinine Ratio: 12 (ref 10–24)
BUN: 15 mg/dL (ref 8–27)
Bilirubin Total: 0.2 mg/dL (ref 0.0–1.2)
CO2: 20 mmol/L (ref 20–29)
Calcium: 9.9 mg/dL (ref 8.6–10.2)
Chloride: 109 mmol/L — ABNORMAL HIGH (ref 96–106)
Creatinine, Ser: 1.23 mg/dL (ref 0.76–1.27)
Globulin, Total: 2.7 g/dL (ref 1.5–4.5)
Glucose: 119 mg/dL — ABNORMAL HIGH (ref 70–99)
Potassium: 4.8 mmol/L (ref 3.5–5.2)
Sodium: 147 mmol/L — ABNORMAL HIGH (ref 134–144)
Total Protein: 7 g/dL (ref 6.0–8.5)
eGFR: 60 mL/min/{1.73_m2} (ref >59)

## 2023-03-17 ENCOUNTER — Telehealth: Payer: Self-pay | Admitting: Family Medicine

## 2023-03-17 NOTE — Telephone Encounter (Signed)
faxed

## 2023-03-17 NOTE — Telephone Encounter (Signed)
Copied from CRM [id number]. Topic: General - Other >> Mar 17, 2023 10:58 AM Franchot Heidelberg wrote: Amil Amen calling from [facility]  Best contact: [phone]  Amil Amen is faxing back a form that is missing a signature and date for [facility]

## 2023-03-18 ENCOUNTER — Ambulatory Visit: Payer: Medicare PPO | Admitting: Urology

## 2023-03-18 ENCOUNTER — Ambulatory Visit
Admission: RE | Admit: 2023-03-18 | Discharge: 2023-03-18 | Disposition: A | Discharge status: Home / Self Care | Payer: Medicare PPO | Attending: Urology | Admitting: Urology

## 2023-03-18 ENCOUNTER — Other Ambulatory Visit: Payer: Self-pay | Admitting: *Deleted

## 2023-03-18 ENCOUNTER — Encounter: Payer: Self-pay | Admitting: Urology

## 2023-03-18 ENCOUNTER — Ambulatory Visit
Admission: RE | Admit: 2023-03-18 | Discharge: 2023-03-18 | Disposition: A | Discharge status: Home / Self Care | Payer: Medicare PPO | Source: Ambulatory Visit | Attending: Urology | Admitting: Urology

## 2023-03-18 VITALS — BP 168/62 | HR 73 | Ht 67.0 in | Wt 204.0 lb

## 2023-03-18 DIAGNOSIS — N200 Calculus of kidney: Secondary | ICD-10-CM

## 2023-03-18 DIAGNOSIS — N529 Male erectile dysfunction, unspecified: Secondary | ICD-10-CM

## 2023-03-18 DIAGNOSIS — N401 Enlarged prostate with lower urinary tract symptoms: Secondary | ICD-10-CM

## 2023-03-18 LAB — BLADDER SCAN AMB NON-IMAGING

## 2023-03-18 MED ORDER — DUTASTERIDE-TAMSULOSIN HCL 0.5-0.4 MG PO CAPS: 1.0000 | ORAL_CAPSULE | Freq: Every day | ORAL | 3 refills | Status: DC

## 2023-03-18 MED ORDER — TADALAFIL 10 MG PO TABS: 10.0000 mg | ORAL_TABLET | Freq: Every day | ORAL | 11 refills | Status: DC | PRN

## 2023-03-18 NOTE — Progress Notes (Signed)
   03/18/2023 1:18 PM   Timothy Hatfield November 29, 1945 [id number]  Reason for visit: Follow up nephrolithiasis, BPH  HPI: I saw [name] in clinic for follow-up of the above issues.  He is a 77 year old male who underwent staged bilateral ureteroscopy in August and September 2021 for significant bilateral stone burden. He has had multiple 24-hour urines previously that showed low urine volume, and he deferred medication like potassium citrate or a repeat 24-hour urine.  Stone analysis that showed 50% calcium oxalate and 50% uric acid previously.  He thinks he passed about 4 small stones in the last year without significant problems.  He has increased citrate in the diet.  I personally reviewed his KUB today that shows some stable right-sided mid pole stones, less than 4 to 5 mm each.  He was hospitalized at [facility] in August 2023 with UTI/sepsis from urinary source, I personally viewed and interpreted those records as well as the CT at that time that showed no hydronephrosis, decompressed bladder, minimal right midpole stone burden.  We discussed general stone prevention strategies including adequate hydration with goal of producing 2.5 L of urine daily, increasing citric acid intake, increasing calcium intake during high oxalate meals, minimizing animal protein, and decreasing salt intake. Information about dietary recommendations given today.   Regarding his BPH, symptoms are well controlled on dutasteride-tamsulosin.  He denies any incontinence, and is currently satisfied with his urinary symptoms.  Prostate measures 50 g on CT from July 2021.  I personally reviewed and interpreted those films.  He has some occasional urgency and frequency, but this seems to be more when he is just sitting at home watching TV, and he does well when he is out of the house.  He is not at a point where he is interested in other medications.  PVR normal at 2ml.  He is also interested in a trial of medications for  ED.  We reviewed the risk and benefits of Cialis for sildenafil, and he opted for trial of Cialis.  Nitrate is on his med list, but he has not taken this medication in over a year, reviewed the importance of not taking PDE 5 inhibitors with nitrates.  Continue dutasteride-tamsulosin, refilled Trial of Cialis, risk and benefits discussed at length Stone prevention strategies discussed-> consider potassium citrate in the future if recurrent stones RTC 1 year with KUB prior, PVR  Sondra Come, MD  [facility] [address] [place], [place] [address] [phone]

## 2023-03-19 NOTE — Telephone Encounter (Signed)
Pt coming in Tuesday at 3pm

## 2023-03-23 DIAGNOSIS — M1711 Unilateral primary osteoarthritis, right knee: Secondary | ICD-10-CM | POA: Diagnosis not present

## 2023-03-23 DIAGNOSIS — M25561 Pain in right knee: Secondary | ICD-10-CM | POA: Diagnosis not present

## 2023-03-23 DIAGNOSIS — G8929 Other chronic pain: Secondary | ICD-10-CM | POA: Diagnosis not present

## 2023-03-24 ENCOUNTER — Ambulatory Visit: Payer: Medicare PPO | Admitting: Family Medicine

## 2023-03-25 DIAGNOSIS — I25118 Atherosclerotic heart disease of native coronary artery with other forms of angina pectoris: Secondary | ICD-10-CM | POA: Diagnosis not present

## 2023-03-25 DIAGNOSIS — R0789 Other chest pain: Secondary | ICD-10-CM | POA: Diagnosis not present

## 2023-03-25 DIAGNOSIS — I25110 Atherosclerotic heart disease of native coronary artery with unstable angina pectoris: Secondary | ICD-10-CM | POA: Diagnosis not present

## 2023-03-25 DIAGNOSIS — R600 Localized edema: Secondary | ICD-10-CM | POA: Diagnosis not present

## 2023-03-25 DIAGNOSIS — I10 Essential (primary) hypertension: Secondary | ICD-10-CM | POA: Diagnosis not present

## 2023-03-25 DIAGNOSIS — Z951 Presence of aortocoronary bypass graft: Secondary | ICD-10-CM | POA: Diagnosis not present

## 2023-03-25 DIAGNOSIS — E669 Obesity, unspecified: Secondary | ICD-10-CM | POA: Diagnosis not present

## 2023-03-25 DIAGNOSIS — E782 Mixed hyperlipidemia: Secondary | ICD-10-CM | POA: Diagnosis not present

## 2023-03-25 DIAGNOSIS — G4733 Obstructive sleep apnea (adult) (pediatric): Secondary | ICD-10-CM | POA: Diagnosis not present

## 2023-03-26 ENCOUNTER — Other Ambulatory Visit: Payer: Self-pay | Admitting: Internal Medicine

## 2023-03-26 DIAGNOSIS — R0789 Other chest pain: Secondary | ICD-10-CM

## 2023-03-26 DIAGNOSIS — I25110 Atherosclerotic heart disease of native coronary artery with unstable angina pectoris: Secondary | ICD-10-CM

## 2023-04-20 ENCOUNTER — Ambulatory Visit: Payer: Medicare PPO | Admitting: Family Medicine

## 2023-04-20 ENCOUNTER — Telehealth (HOSPITAL_COMMUNITY): Payer: Self-pay | Admitting: Emergency Medicine

## 2023-04-20 ENCOUNTER — Encounter: Payer: Self-pay | Admitting: Family Medicine

## 2023-04-20 ENCOUNTER — Other Ambulatory Visit (INDEPENDENT_AMBULATORY_CARE_PROVIDER_SITE_OTHER): Payer: Medicare PPO | Admitting: Radiology

## 2023-04-20 ENCOUNTER — Telehealth (HOSPITAL_COMMUNITY): Payer: Self-pay | Admitting: *Deleted

## 2023-04-20 DIAGNOSIS — M1711 Unilateral primary osteoarthritis, right knee: Secondary | ICD-10-CM | POA: Diagnosis not present

## 2023-04-20 DIAGNOSIS — G4733 Obstructive sleep apnea (adult) (pediatric): Secondary | ICD-10-CM | POA: Diagnosis not present

## 2023-04-20 NOTE — Telephone Encounter (Signed)
Reaching out to patient to offer assistance regarding upcoming cardiac imaging study; pt verbalizes understanding of appt date/time, parking situation and where to check in, pre-test NPO status and medications ordered, and verified current allergies; name and call back number provided for further questions should they arise Rockwell Alexandria RN Navigator Cardiac Imaging [facility] [phone] office [phone] cell  Pt states will hold all meds except metop for test

## 2023-04-20 NOTE — Telephone Encounter (Signed)
Attempted to call patient regarding upcoming cardiac CT appointment. °Left message on voicemail with name and callback number ° °Caden Fatica RN Navigator Cardiac Imaging °[facility] °[phone] Office °[phone] Cell ° °

## 2023-04-21 ENCOUNTER — Ambulatory Visit
Admission: RE | Admit: 2023-04-21 | Discharge: 2023-04-21 | Disposition: A | Discharge status: Home / Self Care | Payer: Medicare PPO | Source: Ambulatory Visit | Attending: Internal Medicine | Admitting: Internal Medicine

## 2023-04-21 DIAGNOSIS — R0789 Other chest pain: Secondary | ICD-10-CM | POA: Insufficient documentation

## 2023-04-21 DIAGNOSIS — I25110 Atherosclerotic heart disease of native coronary artery with unstable angina pectoris: Secondary | ICD-10-CM | POA: Diagnosis not present

## 2023-04-21 MED ORDER — NITROGLYCERIN 0.4 MG SL SUBL
0.8000 mg | SUBLINGUAL_TABLET | Freq: Once | SUBLINGUAL | Status: AC
  Administered 2023-04-21: 0.8 mg via SUBLINGUAL
  Filled 2023-04-21: qty 25

## 2023-04-21 MED ORDER — IOHEXOL 350 MG/ML SOLN
100.0000 mL | Freq: Once | INTRAVENOUS | Status: AC | PRN
  Administered 2023-04-21: 100 mL via INTRAVENOUS

## 2023-04-21 MED ORDER — METOPROLOL TARTRATE 5 MG/5ML IV SOLN
10.0000 mg | Freq: Once | INTRAVENOUS | Status: AC
  Administered 2023-04-21: 10 mg via INTRAVENOUS
  Filled 2023-04-21: qty 10

## 2023-04-21 NOTE — Progress Notes (Signed)
Patient tolerated procedure well. Ambulate w/o difficulty. Denies any lightheadedness or being dizzy. Pt denies any pain at this time. Sitting in chair, pt is encouraged to drink additional water throughout the day and reason explained to patient. Patient verbalized understanding and all questions answered. ABC intact. No further needs at this time. Discharge from procedure area w/o issues.  

## 2023-04-23 NOTE — Progress Notes (Signed)
     Primary Care / Sports Medicine Office Visit  Patient Information:  Patient ID: Timothy Hatfield, male DOB: 02/14/1946 Age: 77 y.o. MRN: [id number]   Timothy Hatfield is a pleasant 77 y.o. male presenting with the following:  Chief Complaint  Patient presents with   Primary osteoarthritis of one knee, right    Mono Visc pt provided WUJ:[id number] Exp: 08/25/2025 Ndc: [id number]     There were no vitals filed for this visit. There were no vitals filed for this visit. There is no height or weight on file to calculate BMI.     Independent interpretation of notes and tests performed by another provider:   None  Procedures performed:   Procedure:  Injection of right knee under ultrasound guidance. Ultrasound guidance utilized for anteromedial approach, no effusion Samsung HS60 device utilized with permanent recording / reporting. Verbal informed consent obtained and verified. Skin prepped in a sterile fashion. Ethyl chloride for topical local analgesia.  Completed without difficulty and tolerated well. Medication: Monovisc 88 mg (4 mL) Advised to contact for fevers/chills, erythema, induration, drainage, or persistent bleeding.    Pertinent History, Exam, Impression, and Recommendations:   Timothy Hatfield was seen today for primary osteoarthritis of one knee, right.  Primary osteoarthritis of one knee, right Overview: Moderate medial tibiofemoral and patellofemoral degenerative changes with prominent superior patellar osteophyte/enthesophyte, enthesophyte at the tibial tuberosity, evidence of dynamic maltracking   Assessment & Plan: Tolerated Monovisc today to right knee  Orders: -     [place] LIMITED JOINT SPACE STRUCTURES LOW RIGHT; Future     Orders & Medications No orders of the defined types were placed in this encounter.  Orders Placed This Encounter  Procedures   [place] LIMITED JOINT SPACE STRUCTURES LOW RIGHT     No follow-ups on file.     Timothy Banana, MD, [facility]   Primary Care Sports Medicine Primary Care and [facility] Medicine at [facility]

## 2023-04-23 NOTE — Assessment & Plan Note (Signed)
Tolerated Monovisc today to right knee

## 2023-04-27 DIAGNOSIS — E782 Mixed hyperlipidemia: Secondary | ICD-10-CM | POA: Diagnosis not present

## 2023-04-27 DIAGNOSIS — R600 Localized edema: Secondary | ICD-10-CM | POA: Diagnosis not present

## 2023-04-27 DIAGNOSIS — E669 Obesity, unspecified: Secondary | ICD-10-CM | POA: Diagnosis not present

## 2023-04-27 DIAGNOSIS — K219 Gastro-esophageal reflux disease without esophagitis: Secondary | ICD-10-CM | POA: Diagnosis not present

## 2023-04-27 DIAGNOSIS — I25118 Atherosclerotic heart disease of native coronary artery with other forms of angina pectoris: Secondary | ICD-10-CM | POA: Diagnosis not present

## 2023-04-27 DIAGNOSIS — Z951 Presence of aortocoronary bypass graft: Secondary | ICD-10-CM | POA: Diagnosis not present

## 2023-04-27 DIAGNOSIS — G4733 Obstructive sleep apnea (adult) (pediatric): Secondary | ICD-10-CM | POA: Diagnosis not present

## 2023-04-27 DIAGNOSIS — I10 Essential (primary) hypertension: Secondary | ICD-10-CM | POA: Diagnosis not present

## 2023-04-27 DIAGNOSIS — R0789 Other chest pain: Secondary | ICD-10-CM | POA: Diagnosis not present

## 2023-04-29 ENCOUNTER — Inpatient Hospital Stay: Admission: RE | Admit: 2023-04-29 | Payer: Medicare PPO | Source: Ambulatory Visit

## 2023-05-14 ENCOUNTER — Ambulatory Visit
Admission: EM | Admit: 2023-05-14 | Discharge: 2023-05-14 | Disposition: A | Discharge status: Home / Self Care | Payer: Medicare PPO

## 2023-05-14 DIAGNOSIS — S51019A Laceration without foreign body of unspecified elbow, initial encounter: Secondary | ICD-10-CM

## 2023-05-14 DIAGNOSIS — W108XXA Fall (on) (from) other stairs and steps, initial encounter: Secondary | ICD-10-CM | POA: Diagnosis not present

## 2023-05-14 NOTE — Discharge Instructions (Signed)
Redress wound daily.  Watch for signs and symptoms of infection.

## 2023-05-14 NOTE — ED Provider Notes (Addendum)
Timothy Hatfield    CSN: [id number] Arrival date & time: 05/14/23  1417      History   Chief Complaint Chief Complaint  Patient presents with   Fall    HPI Timothy Hatfield is a 77 y.o. male.    Fall    Patient presents to urgent care after slip and fall earlier this morning.  He endorses several abrasions on his arms and elbow.  He denies loss of consciousness though he thinks he hit his head.  Denies any joint pain but endorses right knee pain at baseline for which he treats with Tylenol and NSAIDs.  Patient states he was ascending stairs then remembered that he had forgotten something and stepped backward thinking he was on the first step.  Past Medical History:  Diagnosis Date   Allergy    BPH (benign prostatic hyperplasia)    Coronary artery disease    GERD (gastroesophageal reflux disease)    History of kidney stones    Hyperlipidemia    Hypertension    Pneumonia    Renal disorder    kidney stones   Sleep apnea    CPAP    Patient Active Problem List   Diagnosis Date Noted   Primary osteoarthritis of one knee, right 03/17/2022   Benign prostatic hyperplasia with lower urinary tract symptoms 01/12/2020   GERD without esophagitis 03/25/2018   Moderate episode of recurrent major depressive disorder (HCC) 03/25/2018   Acute calcific periarthritis 06/18/2015   Allergic rhinitis 06/18/2015   Arthritis 06/18/2015   Essential (primary) hypertension 06/18/2015   Barrett esophagus 06/18/2015   HLD (hyperlipidemia) 06/18/2015   External hemorrhoid 06/18/2015   Gastroenteritis 06/18/2015   Bilateral nephrolithiasis 05/15/2014    Past Surgical History:  Procedure Laterality Date   CARDIAC CATHETERIZATION Left 09/09/2016   Procedure: Left Heart Cath and Coronary Angiography;  Surgeon: Alwyn Pea, MD;  Location: [facility] INVASIVE CV LAB;  Service: Cardiovascular;  Laterality: Left;   COLONOSCOPY  2013   normal/ Dr Mechele Collin- cleared for 10 yrs    COLONOSCOPY WITH PROPOFOL N/A 01/03/2018   Procedure: COLONOSCOPY WITH PROPOFOL;  Surgeon: Scot Jun, MD;  Location: [facility] ENDOSCOPY;  Service: Endoscopy;  Laterality: N/A;   CORONARY ARTERY BYPASS GRAFT     CYSTOSCOPY W/ RETROGRADES Bilateral 07/11/2020   Procedure: CYSTOSCOPY WITH RETROGRADE PYELOGRAM;  Surgeon: Sondra Come, MD;  Location: [facility] ORS;  Service: Urology;  Laterality: Bilateral;   CYSTOSCOPY W/ RETROGRADES  07/26/2020   Procedure: CYSTOSCOPY WITH RETROGRADE PYELOGRAM;  Surgeon: Sondra Come, MD;  Location: [facility] ORS;  Service: Urology;;   CYSTOSCOPY W/ URETERAL STENT REMOVAL Right 07/26/2020   Procedure: CYSTOSCOPY WITH STENT REMOVAL;  Surgeon: Sondra Come, MD;  Location: [facility] ORS;  Service: Urology;  Laterality: Right;   CYSTOSCOPY/URETEROSCOPY/HOLMIUM LASER/STENT PLACEMENT Bilateral 07/11/2020   Procedure: CYSTOSCOPY/URETEROSCOPY/HOLMIUM LASER/STENT PLACEMENT;  Surgeon: Sondra Come, MD;  Location: [facility] ORS;  Service: Urology;  Laterality: Bilateral;   CYSTOSCOPY/URETEROSCOPY/HOLMIUM LASER/STENT PLACEMENT Left 07/26/2020   Procedure: CYSTOSCOPY/URETEROSCOPY/HOLMIUM LASER/STENT EXCHANGE;  Surgeon: Sondra Come, MD;  Location: [facility] ORS;  Service: Urology;  Laterality: Left;   ESOPHAGOGASTRODUODENOSCOPY (EGD) WITH PROPOFOL N/A 01/03/2018   Procedure: ESOPHAGOGASTRODUODENOSCOPY (EGD) WITH PROPOFOL;  Surgeon: Scot Jun, MD;  Location: [facility] ENDOSCOPY;  Service: Endoscopy;  Laterality: N/A;   EXTRACORPOREAL SHOCK WAVE LITHOTRIPSY Left 07/11/2020   Procedure: EXTRACORPOREAL SHOCK WAVE LITHOTRIPSY (ESWL);  Surgeon: Sondra Come, MD;  Location: [facility] ORS;  Service: Urology;  Laterality: Left;  EYE SURGERY     HERNIA REPAIR     IR NEPHROSTOMY PLACEMENT RIGHT  03/15/2018   kidney stones     lithotripsy   LITHOTRIPSY     NEPHROLITHOTOMY Right 03/15/2018   Procedure: NEPHROLITHOTOMY PERCUTANEOUS;  Surgeon: Riki Altes, MD;  Location: [facility] ORS;  Service:  Urology;  Laterality: Right;   STONE EXTRACTION WITH BASKET  07/11/2020   Procedure: BLADDER STONE EXTRACTION;  Surgeon: Sondra Come, MD;  Location: [facility] ORS;  Service: Urology;;   UPPER GI ENDOSCOPY  2013       Home Medications    Prior to Admission medications   Medication Sig Start Date End Date Taking? Authorizing Provider  aspirin EC 81 MG tablet Take 81 mg by mouth daily.   Yes [provider]  benazepril-hydrochlorthiazide (LOTENSIN HCT) 10-12.5 MG tablet Take 1 tablet by mouth daily. 03/15/23  Yes Duanne Limerick, MD  Coenzyme Q10 (COQ-10) 100 MG CAPS Take 100 mg by mouth at bedtime.   Yes [provider]  Dutasteride-Tamsulosin HCl 0.5-0.4 MG CAPS Take 1 capsule by mouth daily. 03/18/23  Yes Sondra Come, MD  loratadine (CLARITIN) 10 MG tablet Take 1 tablet (10 mg total) by mouth daily. For allergies 03/15/23  Yes Duanne Limerick, MD  meloxicam (MOBIC) 15 MG tablet TAKE 1 TABLET BY MOUTH DAILY 08/18/22  Yes Duanne Limerick, MD  metoprolol succinate (TOPROL-XL) 50 MG 24 hr tablet Take 1 tablet (50 mg total) by mouth daily. TAKE WITH OR IMMEDIATELY FOLLOWING A MEAL. 03/15/23  Yes Duanne Limerick, MD  MONOVISC 88 MG/4ML SOSY intra-articular injection SMARTSIG:I-ARTIC 03/17/23  Yes [provider]  Multiple Vitamin (MULTIVITAMIN WITH MINERALS) TABS tablet Take 1 tablet by mouth at bedtime.   Yes [provider]  nitroGLYCERIN (NITROSTAT) 0.4 MG SL tablet Place 1 tablet (0.4 mg total) under the tongue every 5 (five) minutes as needed for chest pain. 06/03/21  Yes Duanne Limerick, MD  pantoprazole (PROTONIX) 40 MG tablet Take 1 tablet (40 mg total) by mouth daily. 03/15/23  Yes Duanne Limerick, MD  rosuvastatin (CRESTOR) 5 MG tablet Take 5 mg by mouth daily. 01/15/22  Yes [provider]  sertraline (ZOLOFT) 50 MG tablet Take 1 tablet (50 mg total) by mouth daily. One tablet daily 03/10/22  Yes Duanne Limerick, MD  tadalafil (CIALIS) 10 MG  tablet Take 1-2 tablets (10-20 mg total) by mouth daily as needed for erectile dysfunction. 03/18/23  Yes Sondra Come, MD  triamcinolone (NASACORT) 55 MCG/ACT AERO nasal inhaler Place 2 sprays into the nose daily. otc   Yes [provider]  acetaminophen (TYLENOL) 500 MG tablet Take 1,000 mg by mouth daily as needed for moderate pain.    [provider]  EPINEPHrine 0.3 mg/0.3 mL IJ SOAJ injection Inject 0.3 mg into the muscle as needed for anaphylaxis. 05/22/21   Duanne Limerick, MD  mupirocin ointment (BACTROBAN) 2 % Apply 1 application topically 2 (two) times daily. 01/23/20   Duanne Limerick, MD  prednisoLONE acetate (PRED FORTE) 1 % ophthalmic suspension Administer 1 drop to the right eye Three (3) times a day. Patient not taking: Reported on 03/15/2023    [provider]    Family History Family History  Problem Relation Age of Onset   Arrhythmia Mother    Coronary artery disease Father    Diabetes Father    Hyperlipidemia Brother     Social History Social History   Tobacco  Use   Smoking status: Never    Passive exposure: Never   Smokeless tobacco: Never  Vaping Use   Vaping Use: Never used  Substance Use Topics   Alcohol use: No    Alcohol/week: 0.0 standard drinks of alcohol   Drug use: No     Allergies   Bee venom and Ciprofloxacin   Review of Systems Review of Systems   Physical Exam Triage Vital Signs ED Triage Vitals [05/14/23 1425]  Enc Vitals Group     BP (!) 113/52     Pulse Rate 96     Resp 18     Temp 98.3 F (36.8 C)     Temp Source Oral     SpO2 98 %     Weight      Height      Head Circumference      Peak Flow      Pain Score      Pain Loc      Pain Edu?      Excl. in GC?    No data found.  Updated Vital Signs BP (!) 113/52 (BP Location: Left Arm)   Pulse 96   Temp 98.3 F (36.8 C) (Oral)   Resp 18   SpO2 98%   Visual Acuity Right Eye Distance:   Left Eye Distance:   Bilateral Distance:     Right Eye Near:   Left Eye Near:    Bilateral Near:     Physical Exam Vitals reviewed.  Constitutional:      Appearance: Normal appearance.  HENT:     Head: Normocephalic. No abrasion, contusion or laceration.  Cardiovascular:     Rate and Rhythm: Normal rate and regular rhythm.     Pulses: Normal pulses.     Heart sounds: Normal heart sounds.  Pulmonary:     Effort: Pulmonary effort is normal.     Breath sounds: Normal breath sounds.  Musculoskeletal:        General: Normal range of motion.  Skin:    General: Skin is warm and dry.     Findings: Wound present.       Neurological:     General: No focal deficit present.     Mental Status: He is alert and oriented to person, place, and time.  Psychiatric:        Mood and Affect: Mood normal.        Behavior: Behavior normal.      UC Treatments / Results  Labs (all labs ordered are listed, but only abnormal results are displayed) Labs Reviewed - No data to display  EKG   Radiology No results found.  Procedures Procedures (including critical care time)  Medications Ordered in UC Medications - No data to display  Initial Impression / Assessment and Plan / UC Course  I have reviewed the triage vital signs and the nursing notes.  Pertinent labs & imaging results that were available during my care of the patient were reviewed by me and considered in my medical decision making (see chart for details).   Timothy Hatfield is a 77 y.o. male presenting with skin tears following fall backward from 2nd step. Patient is afebrile without recent antipyretics, satting well on room air. Overall is well appearing, well hydrated, without respiratory distress.   Reviewed relevant chart history.   Patient has 3 skin tears, 2 on the left arm and 1 on the right.  Proximal most tear on the left arm is approximately  2 cm's.  Distal most tear is approximately 10 cm's by 4 cm's.  Right arm, skin tear is approximately 3 cm's.  3  wounds were dressed with antibacterial ointment, nonadherent dressing and wrapped in Coban.  Gave patient instructions to do the same on a daily basis.  He will watch for signs and symptoms of infection.   Final Clinical Impressions(s) / UC Diagnoses   Final diagnoses:  None   Discharge Instructions   None    ED Prescriptions   None    PDMP not reviewed this encounter.   Charma Igo, FNP 05/14/23 1500    ImmordinoJeannett Senior, FNP 05/14/23 1502    ImmordinoJeannett Senior, FNP 05/14/23 1504

## 2023-05-14 NOTE — ED Triage Notes (Signed)
Pt is here for a slip and fall early this morning, Pt has several abrasion on his arms and elbow. Denies LOC.

## 2023-07-13 DIAGNOSIS — Z0100 Encounter for examination of eyes and vision without abnormal findings: Secondary | ICD-10-CM | POA: Diagnosis not present

## 2023-07-13 DIAGNOSIS — Z961 Presence of intraocular lens: Secondary | ICD-10-CM | POA: Diagnosis not present

## 2023-07-20 DIAGNOSIS — H40051 Ocular hypertension, right eye: Secondary | ICD-10-CM | POA: Diagnosis not present

## 2023-07-21 ENCOUNTER — Telehealth: Payer: Self-pay

## 2023-07-21 NOTE — Telephone Encounter (Signed)
Patient called stating that pharmacist at [facility] pharmacy told him that Cialis may interfere with some of his medications Dr Juliann Pares prescribes him and to check with the provider. Patient did call Dr Juliann Pares also but has not heard back yet and wanted ask [place] also about this.

## 2023-07-22 NOTE — Telephone Encounter (Signed)
Called pt no answer, LM for pt der DPR informing him of the information per Dr. Richardo Hanks advised pt to call back for questions or concerns.

## 2023-08-06 ENCOUNTER — Other Ambulatory Visit: Payer: Self-pay | Admitting: Family Medicine

## 2023-08-06 DIAGNOSIS — I10 Essential (primary) hypertension: Secondary | ICD-10-CM

## 2023-08-09 NOTE — Telephone Encounter (Signed)
Requested Prescriptions  Pending Prescriptions Disp Refills   benazepril-hydrochlorthiazide (LOTENSIN HCT) 10-12.5 MG tablet [Pharmacy Med Name: BENAZEPRIL-HCTZ 10-12.5 MG TAB] 90 tablet 0    Sig: TAKE 1 TABLET BY MOUTH DAILY     Cardiovascular:  ACEI + Diuretic Combos Failed - 08/06/2023 12:55 PM      Failed - Na in normal range and within 180 days    Sodium  Date Value Ref Range Status  03/15/2023 147 (H) 134 - 144 mmol/L Final         Passed - K in normal range and within 180 days    Potassium  Date Value Ref Range Status  03/15/2023 4.8 3.5 - 5.2 mmol/L Final  01/01/2012 4.0 3.5 - 5.1 mmol/L Final         Passed - Cr in normal range and within 180 days    Creatinine, Ser  Date Value Ref Range Status  03/15/2023 1.23 0.76 - 1.27 mg/dL Final         Passed - eGFR is 30 or above and within 180 days    GFR calc Af Amer  Date Value Ref Range Status  07/24/2020 49 (L) >60 mL/min Final   GFR calc non Af Amer  Date Value Ref Range Status  07/24/2020 42 (L) >60 mL/min Final   eGFR  Date Value Ref Range Status  03/15/2023 60 >59 mL/min/1.73 Final         Passed - Patient is not pregnant      Passed - Last BP in normal range    BP Readings from Last 1 Encounters:  05/14/23 (!) 113/52         Passed - Valid encounter within last 6 months    Recent Outpatient Visits           3 months ago Primary osteoarthritis of one knee, right   [place] [facility], Ocie Bob, MD   4 months ago Essential (primary) hypertension   [place] [facility] In[facility], MD   9 months ago Primary osteoarthritis of one knee, right   [facility] at [facility], Ocie Bob, MD   11 months ago Essential (primary) hypertension   [place] [facility], MD   12 months ago Primary osteoarthritis of one  knee, right   [facility] at [facility], Ocie Bob, MD       Future Appointments             In 1 month Duanne Limerick, MD [facility] Medicine at [facility], [facility]   In 7 months Richardo Hanks, Laurette Schimke, MD [facility] [place]

## 2023-08-26 ENCOUNTER — Other Ambulatory Visit: Payer: Self-pay | Admitting: Family Medicine

## 2023-08-26 DIAGNOSIS — I10 Essential (primary) hypertension: Secondary | ICD-10-CM

## 2023-08-26 NOTE — Telephone Encounter (Signed)
D/C 09/10/22. Requested Prescriptions  Refused Prescriptions Disp Refills   metoprolol succinate (TOPROL-XL) 25 MG 24 hr tablet [Pharmacy Med Name: METOPROLOL SUCCINATE ER 25 MG TAB] 90 tablet 1    Sig: TAKE 1 TABLET BY MOUTH DAILY     Cardiovascular:  Beta Blockers Passed - 08/26/2023 12:11 PM      Passed - Last BP in normal range    BP Readings from Last 1 Encounters:  05/14/23 (!) 113/52         Passed - Last Heart Rate in normal range    Pulse Readings from Last 1 Encounters:  05/14/23 96         Passed - Valid encounter within last 6 months    Recent Outpatient Visits           4 months ago Primary osteoarthritis of one knee, right   [place] [facility], Ocie Bob, MD   5 months ago Essential (primary) hypertension   [place] [facility], MD   9 months ago Primary osteoarthritis of one knee, right   [facility] at [facility], Ocie Bob, MD   11 months ago Essential (primary) hypertension   [place] [facility], MD   1 year ago Primary osteoarthritis of one knee, right   [facility] Medicine at [facility], Ocie Bob, MD       Future Appointments             In 2 weeks Duanne Limerick, MD [facility] Medicine at [facility], [facility]   In 7 months Richardo Hanks, Laurette Schimke, MD [facility] [place]

## 2023-09-14 ENCOUNTER — Encounter: Payer: Self-pay | Admitting: Family Medicine

## 2023-09-14 ENCOUNTER — Ambulatory Visit: Payer: Medicare PPO | Admitting: Family Medicine

## 2023-09-14 VITALS — BP 126/64 | HR 82 | Ht 67.0 in

## 2023-09-14 DIAGNOSIS — K2270 Barrett's esophagus without dysplasia: Secondary | ICD-10-CM | POA: Diagnosis not present

## 2023-09-14 DIAGNOSIS — I10 Essential (primary) hypertension: Secondary | ICD-10-CM | POA: Diagnosis not present

## 2023-09-14 DIAGNOSIS — Z23 Encounter for immunization: Secondary | ICD-10-CM | POA: Diagnosis not present

## 2023-09-14 DIAGNOSIS — J301 Allergic rhinitis due to pollen: Secondary | ICD-10-CM | POA: Diagnosis not present

## 2023-09-14 DIAGNOSIS — E782 Mixed hyperlipidemia: Secondary | ICD-10-CM

## 2023-09-14 MED ORDER — PANTOPRAZOLE SODIUM 40 MG PO TBEC: 40.0000 mg | DELAYED_RELEASE_TABLET | Freq: Every day | ORAL | 1 refills | Status: DC

## 2023-09-14 MED ORDER — METOPROLOL SUCCINATE ER 50 MG PO TB24: 50.0000 mg | ORAL_TABLET | Freq: Every day | ORAL | 1 refills | Status: DC

## 2023-09-14 MED ORDER — LORATADINE 10 MG PO TABS: 10.0000 mg | ORAL_TABLET | Freq: Every day | ORAL | 1 refills | Status: DC

## 2023-09-14 MED ORDER — BENAZEPRIL-HYDROCHLOROTHIAZIDE 10-12.5 MG PO TABS
1.0000 | ORAL_TABLET | Freq: Every day | ORAL | 1 refills | Status: DC
Start: 2023-09-14 — End: 2024-03-06

## 2023-09-14 NOTE — Progress Notes (Signed)
Patient: Timothy Hatfield, Male    DOB: Apr 26, 1946, 77 y.o.   MRN: [id number] Visit Date: 09/14/2023  Today's Provider: Elizabeth Sauer, MD   Chief Complaint  Patient presents with   Hypertension   Gastroesophageal Reflux   Allergies   Subjective:      ----------------------------------------------------------- Hypertension This is a chronic problem. The current episode started more than 1 year ago. The problem has been gradually improving since onset. The problem is controlled. Pertinent negatives include no anxiety, blurred vision, chest pain, headaches, malaise/fatigue, neck pain, orthopnea, palpitations, peripheral edema, PND, shortness of breath or sweats. There are no associated agents to hypertension. There are no known risk factors for coronary artery disease. Past treatments include beta blockers. The current treatment provides moderate improvement. Compliance problems include medication side effects.  There is no history of CAD/MI or CVA. There is no history of chronic renal disease, a hypertension causing med or renovascular disease.  Gastroesophageal Reflux He reports no abdominal pain, no belching, no chest pain, no choking, no coughing, no dysphagia, no early satiety, no globus sensation, no heartburn, no hoarse voice, no nausea, no sore throat, no stridor, no tooth decay, no water brash or no wheezing. This is a chronic problem. The current episode started more than 1 year ago. The problem has been gradually improving. The symptoms are aggravated by certain foods. Pertinent negatives include no anemia, fatigue, melena, muscle weakness, orthopnea or weight loss. He has tried a PPI for the symptoms. The treatment provided moderate relief. Past procedures do not include an abdominal ultrasound.  URI  Pertinent negatives include no abdominal pain, chest pain, coughing, headaches, nausea, neck pain, sneezing, sore throat or wheezing. The treatment provided moderate relief.    Lab  Results  Component Value Date   CREATININE 1.23 03/15/2023   BUN 15 03/15/2023   NA 147 (H) 03/15/2023   K 4.8 03/15/2023   CL 109 (H) 03/15/2023   CO2 20 03/15/2023   Lab Results  Component Value Date   CHOL 138 03/15/2023   HDL 43 03/15/2023   LDLCALC 60 03/15/2023   TRIG 219 (H) 03/15/2023   CHOLHDL 2.6 10/03/2018   No results found for: "TSH" No results found for: "HGBA1C" Lab Results  Component Value Date   WBC 7.1 07/29/2022   HGB 12.6 (L) 07/29/2022   HCT 39.6 07/29/2022   MCV 91 07/29/2022   PLT 273 07/29/2022   Lab Results  Component Value Date   ALT 11 03/15/2023   AST 19 03/15/2023   ALKPHOS 57 03/15/2023   BILITOT 0.2 03/15/2023    Review of Systems  Constitutional:  Negative for fatigue, malaise/fatigue and weight loss.  HENT:  Negative for hoarse voice, postnasal drip, sneezing and sore throat.   Eyes:  Negative for blurred vision, pain, redness and itching.  Respiratory:  Negative for apnea, cough, choking, shortness of breath and wheezing.   Cardiovascular:  Negative for chest pain, palpitations, orthopnea, leg swelling and PND.  Gastrointestinal:  Negative for abdominal pain, dysphagia, heartburn, melena and nausea.  Endocrine: Negative for polydipsia and polyuria.  Musculoskeletal:  Negative for muscle weakness and neck pain.  Neurological:  Negative for headaches.    Social History   Socioeconomic History   Marital status: Married    Spouse name: Not on file   Number of children: Not on file   Years of education: Not on file   Highest education level: Not on file  Occupational History   Not on file  Tobacco Use   Smoking status: Never    Passive exposure: Never   Smokeless tobacco: Never  Vaping Use   Vaping status: Never Used  Substance and Sexual Activity   Alcohol use: No    Alcohol/week: 0.0 standard drinks of alcohol   Drug use: No   Sexual activity: Yes  Other Topics Concern   Not on file  Social History Narrative   Not  on file   Social Determinants of Health   Financial Resource Strain: Low Risk  (02/10/2023)   Overall Financial Resource Strain (CARDIA)    Difficulty of Paying Living Expenses: Not hard at all  Food Insecurity: No Food Insecurity (02/10/2023)   Hunger Vital Sign    Worried About Running Out of Food in the Last Year: Never true    Ran Out of Food in the Last Year: Never true  Transportation Needs: No Transportation Needs (02/10/2023)   PRAPARE - Administrator, Civil Service (Medical): No    Lack of Transportation (Non-Medical): No  Physical Activity: Insufficiently Active (02/10/2023)   Exercise Vital Sign    Days of Exercise per Week: 3 days    Minutes of Exercise per Session: 30 min  Stress: No Stress Concern Present (02/10/2023)   [employer] of Occupational Health - Occupational Stress Questionnaire    Feeling of Stress : Not at all  Social Connections: Socially Isolated (02/10/2023)   Social Connection and Isolation Panel [NHANES]    Frequency of Communication with Friends and Family: More than three times a week    Frequency of Social Gatherings with Friends and Family: Twice a week    Attends Religious Services: Never    Database administrator or Organizations: No    Attends Banker Meetings: Never    Marital Status: Widowed  Intimate Partner Violence: Not At Risk (02/10/2023)   Humiliation, Afraid, Rape, and Kick questionnaire    Fear of Current or Ex-Partner: No    Emotionally Abused: No    Physically Abused: No    Sexually Abused: No    Patient Active Problem List   Diagnosis Date Noted   Primary osteoarthritis of one knee, right 03/17/2022   Benign prostatic hyperplasia with lower urinary tract symptoms 01/12/2020   GERD without esophagitis 03/25/2018   Moderate episode of recurrent major depressive disorder (HCC) 03/25/2018   Acute calcific periarthritis 06/18/2015   Allergic rhinitis 06/18/2015   Arthritis 06/18/2015   Essential  (primary) hypertension 06/18/2015   Barrett esophagus 06/18/2015   HLD (hyperlipidemia) 06/18/2015   External hemorrhoid 06/18/2015   Gastroenteritis 06/18/2015   Bilateral nephrolithiasis 05/15/2014    Past Surgical History:  Procedure Laterality Date   CARDIAC CATHETERIZATION Left 09/09/2016   Procedure: Left Heart Cath and Coronary Angiography;  Surgeon: Alwyn Pea, MD;  Location: [facility] INVASIVE CV LAB;  Service: Cardiovascular;  Laterality: Left;   COLONOSCOPY  2013   normal/ Dr Mechele Collin- cleared for 10 yrs   COLONOSCOPY WITH PROPOFOL N/A 01/03/2018   Procedure: COLONOSCOPY WITH PROPOFOL;  Surgeon: Scot Jun, MD;  Location: [facility] ENDOSCOPY;  Service: Endoscopy;  Laterality: N/A;   CORONARY ARTERY BYPASS GRAFT     CYSTOSCOPY W/ RETROGRADES Bilateral 07/11/2020   Procedure: CYSTOSCOPY WITH RETROGRADE PYELOGRAM;  Surgeon: Sondra Come, MD;  Location: [facility] ORS;  Service: Urology;  Laterality: Bilateral;   CYSTOSCOPY W/ RETROGRADES  07/26/2020   Procedure: CYSTOSCOPY WITH RETROGRADE PYELOGRAM;  Surgeon: Sondra Come, MD;  Location: [facility] ORS;  Service:  Urology;;   CYSTOSCOPY W/ URETERAL STENT REMOVAL Right 07/26/2020   Procedure: CYSTOSCOPY WITH STENT REMOVAL;  Surgeon: Sondra Come, MD;  Location: [facility] ORS;  Service: Urology;  Laterality: Right;   CYSTOSCOPY/URETEROSCOPY/HOLMIUM LASER/STENT PLACEMENT Bilateral 07/11/2020   Procedure: CYSTOSCOPY/URETEROSCOPY/HOLMIUM LASER/STENT PLACEMENT;  Surgeon: Sondra Come, MD;  Location: [facility] ORS;  Service: Urology;  Laterality: Bilateral;   CYSTOSCOPY/URETEROSCOPY/HOLMIUM LASER/STENT PLACEMENT Left 07/26/2020   Procedure: CYSTOSCOPY/URETEROSCOPY/HOLMIUM LASER/STENT EXCHANGE;  Surgeon: Sondra Come, MD;  Location: [facility] ORS;  Service: Urology;  Laterality: Left;   ESOPHAGOGASTRODUODENOSCOPY (EGD) WITH PROPOFOL N/A 01/03/2018   Procedure: ESOPHAGOGASTRODUODENOSCOPY (EGD) WITH PROPOFOL;  Surgeon: Scot Jun, MD;  Location[facility] ENDOSCOPY;  Service: Endoscopy;  Laterality: N/A;   EXTRACORPOREAL SHOCK WAVE LITHOTRIPSY Left 07/11/2020   Procedure: EXTRACORPOREAL SHOCK WAVE LITHOTRIPSY (ESWL);  Surgeon: Sondra Come, MD;  Location: [facility] ORS;  Service: Urology;  Laterality: Left;   EYE SURGERY     HERNIA REPAIR     IR NEPHROSTOMY PLACEMENT RIGHT  03/15/2018   kidney stones     lithotripsy   LITHOTRIPSY     NEPHROLITHOTOMY Right 03/15/2018   Procedure: NEPHROLITHOTOMY PERCUTANEOUS;  Surgeon: Riki Altes, MD;  Location: [facility] ORS;  Service: Urology;  Laterality: Right;   STONE EXTRACTION WITH BASKET  07/11/2020   Procedure: BLADDER STONE EXTRACTION;  Surgeon: Sondra Come, MD;  Location: [facility] ORS;  Service: Urology;;   UPPER GI ENDOSCOPY  2013    His family history includes Arrhythmia in his mother; Coronary artery disease in his father; Diabetes in his father; Hyperlipidemia in his brother.     Current Meds  Medication Sig   acetaminophen (TYLENOL) 500 MG tablet Take 1,000 mg by mouth daily as needed for moderate pain.   aspirin EC 81 MG tablet Take 81 mg by mouth daily.   benazepril-hydrochlorthiazide (LOTENSIN HCT) 10-12.5 MG tablet TAKE 1 TABLET BY MOUTH DAILY   Coenzyme Q10 (COQ-10) 100 MG CAPS Take 100 mg by mouth at bedtime.   EPINEPHrine 0.3 mg/0.3 mL IJ SOAJ injection Inject 0.3 mg into the muscle as needed for anaphylaxis.   loratadine (CLARITIN) 10 MG tablet Take 1 tablet (10 mg total) by mouth daily. For allergies   melatonin 5 MG TABS Take 5 mg by mouth.   meloxicam (MOBIC) 15 MG tablet TAKE 1 TABLET BY MOUTH DAILY   metoprolol succinate (TOPROL-XL) 50 MG 24 hr tablet Take 1 tablet (50 mg total) by mouth daily. TAKE WITH OR IMMEDIATELY FOLLOWING A MEAL.   Multiple Vitamin (MULTIVITAMIN WITH MINERALS) TABS tablet Take 1 tablet by mouth at bedtime.   mupirocin ointment (BACTROBAN) 2 % Apply 1 application topically 2 (two) times daily.   nitroGLYCERIN (NITROSTAT) 0.4 MG SL tablet Place 1  tablet (0.4 mg total) under the tongue every 5 (five) minutes as needed for chest pain.   pantoprazole (PROTONIX) 40 MG tablet Take 1 tablet (40 mg total) by mouth daily.   prednisoLONE acetate (PRED FORTE) 1 % ophthalmic suspension    rosuvastatin (CRESTOR) 5 MG tablet Take 5 mg by mouth daily.   torsemide (DEMADEX) 20 MG tablet Take 20 mg by mouth daily.   triamcinolone (NASACORT) 55 MCG/ACT AERO nasal inhaler Place 2 sprays into the nose daily. otc   [DISCONTINUED] sertraline (ZOLOFT) 50 MG tablet Take 1 tablet (50 mg total) by mouth daily. One tablet daily    Patient Care Team: Duanne Limerick, MD as PCP - General (Family Medicine)  Objective:   Wt Readings from Last 3 Encounters:  03/18/23 204 lb (92.5 kg)  03/15/23 204 lb (92.5 kg)  02/10/23 203 lb (92.1 kg)    Vitals: BP 126/64   Pulse 82   Ht 5\' 7"  (1.702 m)   SpO2 99%   BMI 31.95 kg/m   Physical Exam Vitals and nursing note reviewed.  HENT:     Head: Normocephalic.     Right Ear: Tympanic membrane and external ear normal.     Left Ear: Tympanic membrane and external ear normal.     Nose: Nose normal.     Mouth/Throat:     Mouth: Mucous membranes are moist.     Pharynx: No oropharyngeal exudate or posterior oropharyngeal erythema.  Eyes:     General: No scleral icterus.       Right eye: No discharge.        Left eye: No discharge.     Conjunctiva/sclera: Conjunctivae normal.     Pupils: Pupils are equal, round, and reactive to light.  Neck:     Thyroid: No thyromegaly.     Vascular: No JVD.     Trachea: No tracheal deviation.  Cardiovascular:     Rate and Rhythm: Normal rate and regular rhythm.     Heart sounds: Normal heart sounds. No murmur heard.    No friction rub. No gallop.  Pulmonary:     Effort: No respiratory distress.     Breath sounds: Normal breath sounds. No wheezing, rhonchi or rales.  Abdominal:     General: Bowel sounds are normal.     Palpations: Abdomen is soft. There is no  mass.     Tenderness: There is no abdominal tenderness. There is no guarding or rebound.  Musculoskeletal:        General: No tenderness. Normal range of motion.     Cervical back: Normal range of motion and neck supple.  Lymphadenopathy:     Cervical: No cervical adenopathy.  Skin:    General: Skin is warm.     Findings: No rash.  Neurological:     Mental Status: He is alert and oriented to person, place, and time.     Cranial Nerves: No cranial nerve deficit.     Deep Tendon Reflexes: Reflexes are normal and symmetric.     Activities of Daily Living    02/10/2023    3:07 PM  In your present state of health, do you have any difficulty performing the following activities:  Hearing? 0  Vision? 0  Difficulty concentrating or making decisions? 0  Walking or climbing stairs? 0  Dressing or bathing? 0  Doing errands, shopping? 0  Preparing Food and eating ? N  Using the Toilet? N  In the past six months, have you accidently leaked urine? N  Do you have problems with loss of bowel control? N  Managing your Medications? N  Managing your Finances? N  Housekeeping or managing your Housekeeping? N    Fall Risk Assessment    09/14/2023    1:57 PM 03/15/2023    3:01 PM 02/10/2023    3:06 PM 09/10/2022    1:55 PM 08/07/2022    2:13 PM  Fall Risk   Falls in the past year? 0 0 1 0 0  Number falls in past yr: 0 0 0 0 0  Injury with Fall? 0 0 0 0 0  Risk for fall due to : No Fall Risks No Fall Risks History of fall(s)  No Fall Risks  Follow up Falls evaluation completed Falls evaluation completed Falls prevention discussed;Falls evaluation completed Falls evaluation completed Falls evaluation completed     Depression Screen    09/14/2023    1:57 PM 03/15/2023    3:01 PM 02/10/2023    3:02 PM 09/10/2022    1:55 PM  PHQ 2/9 Scores  PHQ - 2 Score 0 0 0 0  PHQ- 9 Score 1 0 0 0       02/10/2023    3:12 PM  6CIT Screen  What Year? 0 points  What month? 0 points  What time? 0  points  Count back from 20 0 points  Months in reverse 2 points  Repeat phrase 4 points  Total Score 6 points    Medicare Annual Wellness Visit Summary:  Reviewed patient's Family Medical History Reviewed and updated list of patient's medical providers Assessment of cognitive impairment was done Assessed patient's functional ability Established a written schedule for health screening services Health Risk Assessent Completed and Reviewed  Exercise Activities and Dietary recommendations  Goals      DIET - EAT MORE FRUITS AND VEGETABLES        Immunization History  Administered Date(s) Administered   Fluad Quad(high Dose 65+) 08/07/2019, 09/05/2020, 09/09/2021, 09/10/2022   Fluad Trivalent(High Dose 65+) 09/14/2023   Influenza, High Dose Seasonal PF 09/28/2017, 10/03/2018   Influenza,inj,Quad PF,6+ Mos 10/25/2014, 08/02/2015, 08/17/2016   Influenza-Unspecified 10/25/2014, 08/02/2015, 08/23/2016, 09/28/2017, 10/03/2018   Pneumococcal Conjugate-13 01/23/2020   Pneumococcal Polysaccharide-23 09/28/2017    Health Maintenance  Topic Date Due   DTaP/Tdap/Td (1 - Tdap) Never done   Zoster Vaccines- Shingrix (1 of 2) Never done   Medicare Annual Wellness (AWV)  02/10/2024   Pneumonia Vaccine 63+ Years old  Completed   INFLUENZA VACCINE  Completed   HPV VACCINES  Aged Out   Colonoscopy  Discontinued   COVID-19 Vaccine  Discontinued   Hepatitis C Screening  Discontinued    Discussed health benefits of physical activity, and encouraged him to engage in regular exercise appropriate for his age and condition.    ------------------------------------------------------------------------------------------------------------  Assessment & Plan:    1. Essential (primary) hypertension Chronic.  Controlled.  Stable.  Blood pressure is 126/64.  Continue metoprolol XL 50 mg once a day and benazepril hydrochlorothiazide 10-12.5 mg once a day.  Will check renal function panel for  electrolytes and GFR.  Will recheck patient in 6 months. - metoprolol succinate (TOPROL-XL) 50 MG 24 hr tablet; Take 1 tablet (50 mg total) by mouth daily. TAKE WITH OR IMMEDIATELY FOLLOWING A MEAL.  Dispense: 90 tablet; Refill: 1 - benazepril-hydrochlorthiazide (LOTENSIN HCT) 10-12.5 MG tablet; Take 1 tablet by mouth daily.  Dispense: 90 tablet; Refill: 1 - Renal Function Panel - Lipid Panel With LDL/HDL Ratio  2. Barrett's esophagus without dysplasia .  Controlled.  Stable.  Continue pantoprazole 40 mg once a day. - pantoprazole (PROTONIX) 40 MG tablet; Take 1 tablet (40 mg total) by mouth daily.  Dispense: 90 tablet; Refill: 1  3. Seasonal allergic rhinitis due to pollen Chronic.  Episodic.  Currently stable but is easily agitated by changes in weather and pollen counts.  Continue loratadine 10 mg once a day. - loratadine (CLARITIN) 10 MG tablet; Take 1 tablet (10 mg total) by mouth daily. For allergies  Dispense: 90 tablet; Refill: 1  4. Mixed hyperlipidemia Chronic.  Diet controlled.  Will check lipid panel for current level of control. - Lipid Panel With LDL/HDL Ratio  5. Need for influenza vaccination Discussed and administered. - Flu Vaccine Trivalent High Dose (Fluad)

## 2023-09-15 LAB — RENAL FUNCTION PANEL
Albumin: 4.2 g/dL (ref 3.8–4.8)
BUN/Creatinine Ratio: 13 (ref 10–24)
BUN: 15 mg/dL (ref 8–27)
CO2: 22 mmol/L (ref 20–29)
Calcium: 9.2 mg/dL (ref 8.6–10.2)
Chloride: 103 mmol/L (ref 96–106)
Creatinine, Ser: 1.17 mg/dL (ref 0.76–1.27)
Glucose: 129 mg/dL — ABNORMAL HIGH (ref 70–99)
Phosphorus: 2.4 mg/dL — ABNORMAL LOW (ref 2.8–4.1)
Potassium: 4.1 mmol/L (ref 3.5–5.2)
Sodium: 141 mmol/L (ref 134–144)
eGFR: 64 mL/min/{1.73_m2} (ref >59)

## 2023-09-15 LAB — LIPID PANEL WITH LDL/HDL RATIO
Cholesterol, Total: 126 mg/dL (ref 100–199)
HDL: 43 mg/dL (ref >39)
LDL Chol Calc (NIH): 56 mg/dL (ref 0–99)
LDL/HDL Ratio: 1.3 ratio (ref 0.0–3.6)
Triglycerides: 157 mg/dL — ABNORMAL HIGH (ref 0–149)
VLDL Cholesterol Cal: 27 mg/dL (ref 5–40)

## 2023-10-19 DIAGNOSIS — H40051 Ocular hypertension, right eye: Secondary | ICD-10-CM | POA: Diagnosis not present

## 2023-10-19 DIAGNOSIS — Z961 Presence of intraocular lens: Secondary | ICD-10-CM | POA: Diagnosis not present

## 2023-10-28 ENCOUNTER — Telehealth: Payer: Self-pay

## 2023-10-28 NOTE — Telephone Encounter (Signed)
Tried to complete PA on [email or web address] (Key: [id number])  Auth not required for Monovisc.

## 2023-10-28 NOTE — Telephone Encounter (Signed)
-----   Message from Jerrol Banana sent at 09/14/2023  1:55 PM EDT ----- Regarding: Need Monovisc 88 mg This patient had Monovisc 88 mg in 03/2023, can repeat every 6 months so he should be ready next month. He came today to see Yetta Barre and is wanting repeat gel injection.  Please obtain this for him and schedule a 20 minute visit for ortho follow-up.   He already received Monovisc so we should be good for coverage.  Thanks, JJM

## 2023-10-29 NOTE — Telephone Encounter (Signed)
Completed a Benefits Investigation on [employer].  Office copayment will be $35. Product Copayment will be $50. Spoke with patient and informed [facility] will reach out to patient for payment of $50 and then they will ship medication to our office and we will schedule patient for Monovisc.  - Adeja Sarratt

## 2023-11-01 ENCOUNTER — Telehealth: Payer: Self-pay | Admitting: Family Medicine

## 2023-11-01 NOTE — Telephone Encounter (Signed)
Barbara Cower with [facility] pharmacy needs clarification on the Monovisc.  Is it bilat or rt knee? [phone]

## 2023-11-01 NOTE — Telephone Encounter (Signed)
Called and clarified this should be RT KNEE only.

## 2023-11-03 ENCOUNTER — Telehealth: Payer: Self-pay | Admitting: Family Medicine

## 2023-11-03 NOTE — Telephone Encounter (Signed)
Copied from CRM [id number]. Topic: General - Other >> Nov 02, 2023  5:09 PM Macon Large wrote: Reason for CRM: Para March with [facility] called regarding delivery of knee injection. Cb# [phone]

## 2023-11-03 NOTE — Telephone Encounter (Signed)
Called Pharmacy back. Delivery was set up to be delivered to our office for patient Monovisc.   - Timothy Hatfield

## 2023-11-04 DIAGNOSIS — G4733 Obstructive sleep apnea (adult) (pediatric): Secondary | ICD-10-CM | POA: Diagnosis not present

## 2023-11-10 ENCOUNTER — Telehealth: Payer: Self-pay | Admitting: Family Medicine

## 2023-11-10 ENCOUNTER — Ambulatory Visit: Payer: Self-pay

## 2023-11-10 NOTE — Telephone Encounter (Signed)
Patient said he wants XRAY of back. [place] Spaniel he has had back pain on and off for 50 years. Wants new XR. Explained we have to see him first before XR can be ordered. Scheduled pt with Dr Yetta Barre to discuss tomorrow for back pain.  - Timothy Hatfield

## 2023-11-10 NOTE — Telephone Encounter (Signed)
Patient called said he would like to come to [facility] tomorrow afternoon to get his xray done on his back. Please f/u with patient

## 2023-11-10 NOTE — Telephone Encounter (Signed)
Chief Complaint: Back Pain Symptoms: lower back Frequency: chronic  Pertinent Negatives: Patient denies injury, fall, new symptoms Disposition: [x] ED /[] Urgent Care (no appt availability in office) / [] Appointment(In office/virtual)/ []  [facility]/ [] Home Care/ [] Refused Recommended Disposition /[] [facility]/ []  Follow-up with PCP Additional Notes: Patient states he has chronic back pain that has been under control with tylenol and ibuprofen but about 2 weeks ago he started to experience increase in pain levels. Patient reports moderate to severe lower back pain with movement. Patient states if he is sitting still he has no pain but if he gets up and moves too fast it feels like something is stabbing him in the back. Care advice was given and patient was offered an appointment in office. Patient declined and stated he will have his daughter take him to the ED at [facility] since it is closer and he will not have to ride in the care long. Advised patient I can get him in the office tomorrow but patient stated he will just go to the ED for an x-ray.   Reason for Disposition  [1] MODERATE back pain (e.g., interferes with normal activities) AND [2] present > 3 days  Answer Assessment - Initial Assessment Questions 1. ONSET: "When did the pain begin?"      2 weeks go  2. LOCATION: "Where does it hurt?" (upper, mid or lower back)     Lower back  3. SEVERITY: "How bad is the pain?"  (e.g., Scale 1-10; mild, moderate, or severe)   - MILD (1-3): Doesn't interfere with normal activities.    - MODERATE (4-7): Interferes with normal activities or awakens from sleep.    - SEVERE (8-10): Excruciating pain, unable to do any normal activities.      Moderate to severe with movement  4. PATTERN: "Is the pain constant?" (e.g., yes, no; constant, intermittent)      Comes and goes  5. RADIATION: "Does the pain shoot into your legs or somewhere else?"     No  6. CAUSE:  "What do you think is  causing the back pain?"      It's chronic back for many years 7. BACK OVERUSE:  "Any recent lifting of heavy objects, strenuous work or exercise?"     Moving too fast  8. MEDICINES: "What have you taken so far for the pain?" (e.g., nothing, acetaminophen, NSAIDS)     Tylenol and Ibuprofen  9. NEUROLOGIC SYMPTOMS: "Do you have any weakness, numbness, or problems with bowel/bladder control?"     No  10. OTHER SYMPTOMS: "Do you have any other symptoms?" (e.g., fever, abdomen pain, burning with urination, blood in urine)       Vomiting  Protocols used: Back Pain-A-AH

## 2023-11-11 ENCOUNTER — Encounter: Payer: Self-pay | Admitting: Family Medicine

## 2023-11-11 ENCOUNTER — Other Ambulatory Visit
Admission: RE | Admit: 2023-11-11 | Discharge: 2023-11-11 | Disposition: A | Discharge status: Home / Self Care | Payer: Medicare PPO | Source: Home / Self Care | Attending: Family Medicine | Admitting: Family Medicine

## 2023-11-11 ENCOUNTER — Ambulatory Visit: Payer: Medicare PPO | Admitting: Family Medicine

## 2023-11-11 ENCOUNTER — Ambulatory Visit
Admission: RE | Admit: 2023-11-11 | Discharge: 2023-11-11 | Disposition: A | Discharge status: Home / Self Care | Payer: Medicare PPO | Attending: Family Medicine | Admitting: Family Medicine

## 2023-11-11 ENCOUNTER — Ambulatory Visit
Admission: RE | Admit: 2023-11-11 | Discharge: 2023-11-11 | Disposition: A | Discharge status: Home / Self Care | Payer: Medicare PPO | Source: Ambulatory Visit | Attending: Family Medicine | Admitting: Family Medicine

## 2023-11-11 VITALS — BP 164/78 | HR 81 | Ht 67.0 in | Wt 197.0 lb

## 2023-11-11 DIAGNOSIS — K922 Gastrointestinal hemorrhage, unspecified: Secondary | ICD-10-CM | POA: Insufficient documentation

## 2023-11-11 DIAGNOSIS — M545 Low back pain, unspecified: Secondary | ICD-10-CM

## 2023-11-11 DIAGNOSIS — K920 Hematemesis: Secondary | ICD-10-CM | POA: Diagnosis not present

## 2023-11-11 DIAGNOSIS — F1990 Other psychoactive substance use, unspecified, uncomplicated: Secondary | ICD-10-CM | POA: Diagnosis not present

## 2023-11-11 DIAGNOSIS — K2270 Barrett's esophagus without dysplasia: Secondary | ICD-10-CM

## 2023-11-11 DIAGNOSIS — K317 Polyp of stomach and duodenum: Secondary | ICD-10-CM | POA: Diagnosis not present

## 2023-11-11 DIAGNOSIS — D509 Iron deficiency anemia, unspecified: Secondary | ICD-10-CM

## 2023-11-11 DIAGNOSIS — Z91199 Patient's noncompliance with other medical treatment and regimen due to unspecified reason: Secondary | ICD-10-CM

## 2023-11-11 DIAGNOSIS — M1711 Unilateral primary osteoarthritis, right knee: Secondary | ICD-10-CM

## 2023-11-11 DIAGNOSIS — E860 Dehydration: Secondary | ICD-10-CM

## 2023-11-11 DIAGNOSIS — I10 Essential (primary) hypertension: Secondary | ICD-10-CM | POA: Diagnosis not present

## 2023-11-11 DIAGNOSIS — D130 Benign neoplasm of esophagus: Secondary | ICD-10-CM | POA: Diagnosis not present

## 2023-11-11 DIAGNOSIS — I5032 Chronic diastolic (congestive) heart failure: Secondary | ICD-10-CM | POA: Diagnosis not present

## 2023-11-11 DIAGNOSIS — D649 Anemia, unspecified: Secondary | ICD-10-CM | POA: Diagnosis not present

## 2023-11-11 DIAGNOSIS — K449 Diaphragmatic hernia without obstruction or gangrene: Secondary | ICD-10-CM | POA: Diagnosis not present

## 2023-11-11 DIAGNOSIS — M47816 Spondylosis without myelopathy or radiculopathy, lumbar region: Secondary | ICD-10-CM | POA: Diagnosis not present

## 2023-11-11 DIAGNOSIS — N1831 Chronic kidney disease, stage 3a: Secondary | ICD-10-CM | POA: Diagnosis not present

## 2023-11-11 DIAGNOSIS — I130 Hypertensive heart and chronic kidney disease with heart failure and stage 1 through stage 4 chronic kidney disease, or unspecified chronic kidney disease: Secondary | ICD-10-CM | POA: Diagnosis not present

## 2023-11-11 LAB — CBC WITH DIFFERENTIAL/PLATELET
Abs Immature Granulocytes: 0.03 10*3/uL (ref 0.00–0.07)
Basophils Absolute: 0.1 10*3/uL (ref 0.0–0.1)
Basophils Relative: 1 %
Eosinophils Absolute: 0.1 10*3/uL (ref 0.0–0.5)
Eosinophils Relative: 1 %
HCT: 32.9 % — ABNORMAL LOW (ref 39.0–52.0)
Hemoglobin: 9.4 g/dL — ABNORMAL LOW (ref 13.0–17.0)
Immature Granulocytes: 0 %
Lymphocytes Relative: 16 %
Lymphs Abs: 1.3 10*3/uL (ref 0.7–4.0)
MCH: 21.9 pg — ABNORMAL LOW (ref 26.0–34.0)
MCHC: 28.6 g/dL — ABNORMAL LOW (ref 30.0–36.0)
MCV: 76.5 fL — ABNORMAL LOW (ref 80.0–100.0)
Monocytes Absolute: 0.9 10*3/uL (ref 0.1–1.0)
Monocytes Relative: 12 %
Neutro Abs: 5.7 10*3/uL (ref 1.7–7.7)
Neutrophils Relative %: 70 %
Platelets: 434 10*3/uL — ABNORMAL HIGH (ref 150–400)
RBC: 4.3 MIL/uL (ref 4.22–5.81)
RDW: 19.6 % — ABNORMAL HIGH (ref 11.5–15.5)
WBC: 8.2 10*3/uL (ref 4.0–10.5)
nRBC: 0 % (ref 0.0–0.2)

## 2023-11-11 NOTE — Progress Notes (Signed)
Date:  11/11/2023   Name:  Timothy Hatfield   DOB:  1946-03-24   MRN:  [id number]   Chief Complaint: Back Pain (Many years,lower back left side, come and goes, hurts with movement, does not hurt to sit, has tried heating pad it works, 10 pain scale when moves a certain way hurts for just a second )  Back Pain This is a new problem. The current episode started in the past 7 days. The problem occurs intermittently. The problem has been gradually improving since onset. The pain is present in the lumbar spine. The quality of the pain is described as aching. The pain does not radiate. The pain is at a severity of 9/10. The pain is moderate. The symptoms are aggravated by sitting and standing (twisting). Pertinent negatives include no abdominal pain, bladder incontinence, bowel incontinence, chest pain, dysuria, fever, headaches, leg pain, numbness, paresis, paresthesias, pelvic pain, perianal numbness, tingling, weakness or weight loss. He has tried NSAIDs (3 aleve) for the symptoms. The treatment provided moderate relief.    Lab Results  Component Value Date   NA 141 09/14/2023   K 4.1 09/14/2023   CO2 22 09/14/2023   GLUCOSE 129 (H) 09/14/2023   BUN 15 09/14/2023   CREATININE 1.17 09/14/2023   CALCIUM 9.2 09/14/2023   EGFR 64 09/14/2023   GFRNONAA 42 (L) 07/24/2020   Lab Results  Component Value Date   CHOL 126 09/14/2023   HDL 43 09/14/2023   LDLCALC 56 09/14/2023   TRIG 157 (H) 09/14/2023   CHOLHDL 2.6 10/03/2018   No results found for: "TSH" No results found for: "HGBA1C" Lab Results  Component Value Date   WBC 7.1 07/29/2022   HGB 12.6 (L) 07/29/2022   HCT 39.6 07/29/2022   MCV 91 07/29/2022   PLT 273 07/29/2022   Lab Results  Component Value Date   ALT 11 03/15/2023   AST 19 03/15/2023   ALKPHOS 57 03/15/2023   BILITOT 0.2 03/15/2023   No results found for: "25OHVITD2", "25OHVITD3", "VD25OH"   Review of Systems  Constitutional:  Negative for fever and weight  loss.  Respiratory:  Negative for chest tightness, shortness of breath and wheezing.   Cardiovascular:  Negative for chest pain, palpitations and leg swelling.  Gastrointestinal:  Negative for abdominal pain and bowel incontinence.  Genitourinary:  Negative for bladder incontinence, dysuria and pelvic pain.  Musculoskeletal:  Positive for back pain.  Neurological:  Negative for tingling, weakness, numbness, headaches and paresthesias.    Patient Active Problem List   Diagnosis Date Noted   Primary osteoarthritis of one knee, right 03/17/2022   Benign prostatic hyperplasia with lower urinary tract symptoms 01/12/2020   GERD without esophagitis 03/25/2018   Moderate episode of recurrent major depressive disorder (HCC) 03/25/2018   Acute calcific periarthritis 06/18/2015   Allergic rhinitis 06/18/2015   Arthritis 06/18/2015   Essential (primary) hypertension 06/18/2015   Barrett esophagus 06/18/2015   HLD (hyperlipidemia) 06/18/2015   External hemorrhoid 06/18/2015   Gastroenteritis 06/18/2015   Bilateral nephrolithiasis 05/15/2014    Allergies  Allergen Reactions   Bee Venom Anaphylaxis   Ciprofloxacin Itching    Past Surgical History:  Procedure Laterality Date   CARDIAC CATHETERIZATION Left 09/09/2016   Procedure: Left Heart Cath and Coronary Angiography;  Surgeon: Alwyn Pea, MD;  Location: [facility];  Service: Cardiovascular;  Laterality: Left;   COLONOSCOPY  2013   normal/ Dr Mechele Collin- cleared for 10 yrs   COLONOSCOPY WITH PROPOFOL  N/A 01/03/2018   Procedure: COLONOSCOPY WITH PROPOFOL;  Surgeon: Scot Jun, MD;  Location: [facility] ENDOSCOPY;  Service: Endoscopy;  Laterality: N/A;   CORONARY ARTERY BYPASS GRAFT     CYSTOSCOPY W/ RETROGRADES Bilateral 07/11/2020   Procedure: CYSTOSCOPY WITH RETROGRADE PYELOGRAM;  Surgeon: Sondra Come, MD;  Location: [facility];  Service: Urology;  Laterality: Bilateral;   CYSTOSCOPY W/ RETROGRADES  07/26/2020    Procedure: CYSTOSCOPY WITH RETROGRADE PYELOGRAM;  Surgeon: Sondra Come, MD;  Location: [facility];  Service: Urology;;   CYSTOSCOPY W/ URETERAL STENT REMOVAL Right 07/26/2020   Procedure: CYSTOSCOPY WITH STENT REMOVAL;  Surgeon: Sondra Come, MD;  Location: [facility];  Service: Urology;  Laterality: Right;   CYSTOSCOPY/URETEROSCOPY/HOLMIUM LASER/STENT PLACEMENT Bilateral 07/11/2020   Procedure: CYSTOSCOPY/URETEROSCOPY/HOLMIUM LASER/STENT PLACEMENT;  Surgeon: Sondra Come, MD;  Location: [facility];  Service: Urology;  Laterality: Bilateral;   CYSTOSCOPY/URETEROSCOPY/HOLMIUM LASER/STENT PLACEMENT Left 07/26/2020   Procedure: CYSTOSCOPY/URETEROSCOPY/HOLMIUM LASER/STENT EXCHANGE;  Surgeon: Sondra Come, MD;  Location: [facility];  Service: Urology;  Laterality: Left;   ESOPHAGOGASTRODUODENOSCOPY (EGD) WITH PROPOFOL N/A 01/03/2018   Procedure: ESOPHAGOGASTRODUODENOSCOPY (EGD) WITH PROPOFOL;  Surgeon: Scot Jun, MD;  Location: [facility] ENDOSCOPY;  Service: Endoscopy;  Laterality: N/A;   EXTRACORPOREAL SHOCK WAVE LITHOTRIPSY Left 07/11/2020   Procedure: EXTRACORPOREAL SHOCK WAVE LITHOTRIPSY (ESWL);  Surgeon: Sondra Come, MD;  Location: [facility];  Service: Urology;  Laterality: Left;   EYE SURGERY     HERNIA REPAIR     IR NEPHROSTOMY PLACEMENT RIGHT  03/15/2018   kidney stones     lithotripsy   LITHOTRIPSY     NEPHROLITHOTOMY Right 03/15/2018   Procedure: NEPHROLITHOTOMY PERCUTANEOUS;  Surgeon: Riki Altes, MD;  Location: [facility];  Service: Urology;  Laterality: Right;   STONE EXTRACTION WITH BASKET  07/11/2020   Procedure: BLADDER STONE EXTRACTION;  Surgeon: Sondra Come, MD;  Location: [facility];  Service: Urology;;   UPPER GI ENDOSCOPY  2013    Social History   Tobacco Use   Smoking status: Never    Passive exposure: Never   Smokeless tobacco: Never  Vaping Use   Vaping status: Never Used  Substance Use Topics   Alcohol use: No    Alcohol/week: 0.0 standard drinks of  alcohol   Drug use: No     Medication list has been reviewed and updated.  Current Meds  Medication Sig   acetaminophen (TYLENOL) 500 MG tablet Take 1,000 mg by mouth daily as needed for moderate pain.   aspirin EC 81 MG tablet Take 81 mg by mouth daily.   benazepril-hydrochlorthiazide (LOTENSIN HCT) 10-12.5 MG tablet Take 1 tablet by mouth daily.   Coenzyme Q10 (COQ-10) 100 MG CAPS Take 100 mg by mouth at bedtime.   Dutasteride-Tamsulosin HCl 0.5-0.4 MG CAPS Take 1 capsule by mouth daily.   EPINEPHrine 0.3 mg/0.3 mL IJ SOAJ injection Inject 0.3 mg into the muscle as needed for anaphylaxis.   loratadine (CLARITIN) 10 MG tablet Take 1 tablet (10 mg total) by mouth daily. For allergies   melatonin 5 MG TABS Take 5 mg by mouth.   meloxicam (MOBIC) 15 MG tablet TAKE 1 TABLET BY MOUTH DAILY   metoprolol succinate (TOPROL-XL) 50 MG 24 hr tablet Take 1 tablet (50 mg total) by mouth daily. TAKE WITH OR IMMEDIATELY FOLLOWING A MEAL.   Multiple Vitamin (MULTIVITAMIN WITH MINERALS) TABS tablet Take 1 tablet by mouth at bedtime.   mupirocin ointment (BACTROBAN) 2 % Apply 1 application topically 2 (  two) times daily.   nitroGLYCERIN (NITROSTAT) 0.4 MG SL tablet Place 1 tablet (0.4 mg total) under the tongue every 5 (five) minutes as needed for chest pain.   pantoprazole (PROTONIX) 40 MG tablet Take 1 tablet (40 mg total) by mouth daily.   prednisoLONE acetate (PRED FORTE) 1 % ophthalmic suspension    rosuvastatin (CRESTOR) 5 MG tablet Take 5 mg by mouth daily.   torsemide (DEMADEX) 20 MG tablet Take 20 mg by mouth daily.   triamcinolone (NASACORT) 55 MCG/ACT AERO nasal inhaler Place 2 sprays into the nose daily. otc       11/11/2023    2:43 PM 09/14/2023    1:57 PM 03/15/2023    3:01 PM 09/10/2022    1:56 PM  GAD 7 : Generalized Anxiety Score  Nervous, Anxious, on Edge 0 0 0 0  Control/stop worrying 0 0 0 0  Worry too much - different things 0 0 0 0  Trouble relaxing 0 0 0 0  Restless 0 0 0  0  Easily annoyed or irritable 0 0 0 0  Afraid - awful might happen 0 0 0 0  Total GAD 7 Score 0 0 0 0  Anxiety Difficulty Not difficult at all Not difficult at all Not difficult at all Not difficult at all       11/11/2023    2:42 PM 09/14/2023    1:57 PM 03/15/2023    3:01 PM  Depression screen PHQ 2/9  Decreased Interest 0 0 0  Down, Depressed, Hopeless 1 0 0  PHQ - 2 Score 1 0 0  Altered sleeping 0 0 0  Tired, decreased energy 1 1 0  Change in appetite 0 0 0  Feeling bad or failure about yourself  0 0 0  Trouble concentrating 0 0 0  Moving slowly or fidgety/restless 0 0 0  Suicidal thoughts 0 0 0  PHQ-9 Score 2 1 0  Difficult doing work/chores Not difficult at all Not difficult at all Not difficult at all    BP Readings from Last 3 Encounters:  11/11/23 (!) 170/82  09/14/23 126/64  05/14/23 (!) 113/52    Physical Exam Vitals and nursing note reviewed.  Constitutional:      Appearance: Normal appearance.  HENT:     Head: Normocephalic.     Right Ear: Tympanic membrane, ear canal and external ear normal. There is no impacted cerumen.     Left Ear: Tympanic membrane, ear canal and external ear normal. There is no impacted cerumen.     Nose: Nose normal. No congestion or rhinorrhea.     Mouth/Throat:     Mouth: Mucous membranes are moist.     Pharynx: No oropharyngeal exudate or posterior oropharyngeal erythema.  Eyes:     General: No scleral icterus.       Right eye: No discharge.        Left eye: No discharge.     Conjunctiva/sclera: Conjunctivae normal.     Pupils: Pupils are equal, round, and reactive to light.  Neck:     Thyroid: No thyromegaly.     Vascular: No JVD.     Trachea: No tracheal deviation.  Cardiovascular:     Rate and Rhythm: Normal rate. Rhythm irregular.     Heart sounds: Normal heart sounds. No murmur heard.    No friction rub. No gallop.  Pulmonary:     Effort: No respiratory distress.     Breath sounds: Normal breath sounds. No  wheezing,  rhonchi or rales.  Chest:     Chest wall: No tenderness.  Abdominal:     General: Bowel sounds are normal. There is no distension.     Palpations: Abdomen is soft. There is no hepatomegaly, splenomegaly or mass.     Tenderness: There is no abdominal tenderness. There is no guarding or rebound.     Hernia: No hernia is present.  Genitourinary:    Rectum: Normal. Guaiac result negative. No mass.  Musculoskeletal:        General: No tenderness. Normal range of motion.     Cervical back: Normal range of motion and neck supple.  Lymphadenopathy:     Cervical: No cervical adenopathy.  Skin:    General: Skin is warm.     Capillary Refill: Capillary refill takes more than 3 seconds.     Findings: No rash.  Neurological:     Mental Status: He is alert and oriented to person, place, and time.     Cranial Nerves: No cranial nerve deficit.     Deep Tendon Reflexes: Reflexes are normal and symmetric.     Wt Readings from Last 3 Encounters:  11/11/23 197 lb (89.4 kg)  03/18/23 204 lb (92.5 kg)  03/15/23 204 lb (92.5 kg)    BP (!) 170/82 (BP Location: Left Arm, Patient Position: Sitting, Cuff Size: Normal)   Pulse 81   Ht 5\' 7"  (1.702 m)   Wt 197 lb (89.4 kg)   SpO2 96%   BMI 30.85 kg/m   Assessment and Plan: Patient presents with lower back pain perhaps pelvic pain wanting an x-ray and this perhaps is the least of his worries.  Upon entering the room noted that pale appearance of the patient with no capillary refill and decreased color of the conjunctiva.  Concerns as follows: 1. Upper GI bleed (Primary) New onset.  Patient relates that he probably had an episode of hematemesis yesterday with brown vomitus.  Patient has not vomited today however he relates no melena as well.  Guaiac was done and it was negative.  Decided to get a stat CBC downstairs with x-rays as patient wanted.  2. Microcytic anemia CBC notes that he has a microcytic anemia which would suggest that this is  chronic nature with a hemoglobin of 9.4.  Her main concern is that this is a bleed over time because of the misuse of NSAIDs for pain of his knee and now of his back he is even taking more.  3. Excessive use of nonsteroidal anti-inflammatory drug (NSAID) The concern I have is that he has been is prescribed 15 mg of meloxicam but has been taking as well 3 Aleve twice a day to relieve the pain so not only is he double dosing his sensate he is taking too much of the naproxen. - CBC with Differential/Platelet  4. Primary osteoarthritis of one knee, right As noted patient has osteoarthritis with an upcoming appointment with Dr. Ernest Pine for his knee the patient is going to need something for pain and is not can be able to use NSAIDs  5. Barrett's esophagus without dysplasia Patient has a history of Barrett's esophagitis and there is a possibility that he may have some exacerbation of the bleed of that but most likely is going to be gastric in nature.  Concern is that there may be an ulcer that may be at the point of significantly bleeding given his end-stage use.  6. Lumbar pain Patient has this new onset lumbar pain we  did obtain an x-ray we will probably not get readings until later on this evening but this is exacerbating his symptomatology causing him to use more NSAIDs for control. - DG Lumbar Spine Complete  7. Essential (primary) hypertension Patient has a history of essential hypertension for which I gave him metoprolol and benazepril hydrochlorothiazide initially blood pressure was elevated but the most recent sitting is 164/78 however patient states that he did not take his blood pressure medicine today because he was "too busy ".  8. Primary hypertension As noted above  9. Dehydration Furthermore mucous membranes appear to have decreased moisture which may signify that patient is dehydrated and when patient receives hydration I have a feeling that his hemoglobin hematocrit will drop even  further.  10. Noncompliance The reason this is Melanee Spry is that patient does not recognize that he is taking 2 NSAIDs and is aware that he is probably taking too much Aleve so double dosing of NSAIDs with excessive amounts of naproxen may have contributed to an upper GI bleed.  Patient has been told to go to an emergency room because of the low hemoglobin likely the drop lower on hydration and not knowing the status of the etiology of his bleed that may be of an ulcer nature that needs to be watched in a more timely fashion over the next 2448 hrs.  Patient is agreeable to going to the emergency room. Elizabeth Sauer, MD

## 2023-11-12 ENCOUNTER — Encounter: Payer: Self-pay | Admitting: Family Medicine

## 2023-11-12 DIAGNOSIS — K449 Diaphragmatic hernia without obstruction or gangrene: Secondary | ICD-10-CM | POA: Diagnosis not present

## 2023-11-12 DIAGNOSIS — K922 Gastrointestinal hemorrhage, unspecified: Secondary | ICD-10-CM | POA: Diagnosis not present

## 2023-11-12 DIAGNOSIS — K920 Hematemesis: Secondary | ICD-10-CM | POA: Diagnosis not present

## 2023-11-12 DIAGNOSIS — K2270 Barrett's esophagus without dysplasia: Secondary | ICD-10-CM | POA: Diagnosis not present

## 2023-11-12 DIAGNOSIS — I110 Hypertensive heart disease with heart failure: Secondary | ICD-10-CM | POA: Diagnosis not present

## 2023-11-12 DIAGNOSIS — I509 Heart failure, unspecified: Secondary | ICD-10-CM | POA: Diagnosis not present

## 2023-11-12 DIAGNOSIS — Z79899 Other long term (current) drug therapy: Secondary | ICD-10-CM | POA: Diagnosis not present

## 2023-11-12 DIAGNOSIS — K2289 Other specified disease of esophagus: Secondary | ICD-10-CM | POA: Diagnosis not present

## 2023-11-12 DIAGNOSIS — K317 Polyp of stomach and duodenum: Secondary | ICD-10-CM | POA: Diagnosis not present

## 2023-11-12 DIAGNOSIS — D649 Anemia, unspecified: Secondary | ICD-10-CM | POA: Diagnosis not present

## 2023-11-12 HISTORY — PX: ESOPHAGOGASTRODUODENOSCOPY: SHX1529

## 2023-11-15 ENCOUNTER — Telehealth: Payer: Self-pay | Admitting: Family Medicine

## 2023-11-15 NOTE — Telephone Encounter (Signed)
Copied from CRM [id number]. Topic: General - Other >> Nov 15, 2023  8:57 AM Franchot Heidelberg wrote: Reason for CRM: Pt called and wants to discuss his recent imaging

## 2023-11-15 NOTE — Telephone Encounter (Signed)
Called patient about X ray results. We offered to send a referral to Orthopedics. Pt state he would call back to let us know if he would need a referral or not.

## 2023-11-18 ENCOUNTER — Ambulatory Visit: Payer: Medicare PPO | Admitting: Family Medicine

## 2023-11-18 ENCOUNTER — Encounter: Payer: Self-pay | Admitting: Family Medicine

## 2023-11-18 VITALS — BP 128/80 | HR 79 | Ht 67.0 in | Wt 194.0 lb

## 2023-11-18 DIAGNOSIS — M545 Low back pain, unspecified: Secondary | ICD-10-CM | POA: Diagnosis not present

## 2023-11-18 DIAGNOSIS — K922 Gastrointestinal hemorrhage, unspecified: Secondary | ICD-10-CM

## 2023-11-18 DIAGNOSIS — D509 Iron deficiency anemia, unspecified: Secondary | ICD-10-CM | POA: Diagnosis not present

## 2023-11-18 NOTE — Progress Notes (Addendum)
Date:  11/18/2023   Name:  Timothy Hatfield   DOB:  07-20-1946   MRN:  [id number]   Chief Complaint: Upper GI (Pt in clinic today to make sure CBC levels are up from hospital visit)  GI Problem Primary symptoms do not include weight loss, fatigue, abdominal pain, nausea, vomiting, diarrhea, melena, hematemesis or hematochezia. The illness began 3 to 5 days ago. The problem has been gradually improving.  The illness is also significant for itching. The illness does not include dysphagia, bloating, constipation, tenesmus or back pain. Significant associated medical issues include GERD. Risk factors for a gastrointestinal illness include high risk sexual activity.    Lab Results  Component Value Date   NA 141 09/14/2023   K 4.1 09/14/2023   CO2 22 09/14/2023   GLUCOSE 129 (H) 09/14/2023   BUN 15 09/14/2023   CREATININE 1.17 09/14/2023   CALCIUM 9.2 09/14/2023   EGFR 64 09/14/2023   GFRNONAA 42 (L) 07/24/2020   Lab Results  Component Value Date   CHOL 126 09/14/2023   HDL 43 09/14/2023   LDLCALC 56 09/14/2023   TRIG 157 (H) 09/14/2023   CHOLHDL 2.6 10/03/2018   No results found for: "TSH" No results found for: "HGBA1C" Lab Results  Component Value Date   WBC 8.2 11/11/2023   HGB 9.4 (L) 11/11/2023   HCT 32.9 (L) 11/11/2023   MCV 76.5 (L) 11/11/2023   PLT 434 (H) 11/11/2023   Lab Results  Component Value Date   ALT 11 03/15/2023   AST 19 03/15/2023   ALKPHOS 57 03/15/2023   BILITOT 0.2 03/15/2023   No results found for: "25OHVITD2", "25OHVITD3", "VD25OH"   Review of Systems  Constitutional:  Negative for fatigue and weight loss.  HENT:  Negative for congestion.   Eyes:  Negative for visual disturbance.  Respiratory:  Negative for cough, choking, chest tightness and shortness of breath.   Cardiovascular:  Negative for chest pain, palpitations and leg swelling.  Gastrointestinal:  Positive for blood in stool. Negative for abdominal pain, anal bleeding,  bloating, constipation, diarrhea, dysphagia, hematemesis, hematochezia, melena, nausea and vomiting.  Musculoskeletal:  Negative for back pain.  Skin:  Positive for itching.    Patient Active Problem List   Diagnosis Date Noted   Primary osteoarthritis of one knee, right 03/17/2022   Benign prostatic hyperplasia with lower urinary tract symptoms 01/12/2020   GERD without esophagitis 03/25/2018   Moderate episode of recurrent major depressive disorder (HCC) 03/25/2018   Acute calcific periarthritis 06/18/2015   Allergic rhinitis 06/18/2015   Arthritis 06/18/2015   Essential (primary) hypertension 06/18/2015   Barrett esophagus 06/18/2015   HLD (hyperlipidemia) 06/18/2015   External hemorrhoid 06/18/2015   Gastroenteritis 06/18/2015   Bilateral nephrolithiasis 05/15/2014    Allergies  Allergen Reactions   Bee Venom Anaphylaxis   Ciprofloxacin Itching    Past Surgical History:  Procedure Laterality Date   CARDIAC CATHETERIZATION Left 09/09/2016   Procedure: Left Heart Cath and Coronary Angiography;  Surgeon: Alwyn Pea, MD;  Location: [facility] INVASIVE CV LAB;  Service: Cardiovascular;  Laterality: Left;   COLONOSCOPY  2013   normal/ Dr Mechele Collin- cleared for 10 yrs   COLONOSCOPY WITH PROPOFOL N/A 01/03/2018   Procedure: COLONOSCOPY WITH PROPOFOL;  Surgeon: Scot Jun, MD;  Location: [facility] ENDOSCOPY;  Service: Endoscopy;  Laterality: N/A;   CORONARY ARTERY BYPASS GRAFT     CYSTOSCOPY W/ RETROGRADES Bilateral 07/11/2020   Procedure: CYSTOSCOPY WITH RETROGRADE PYELOGRAM;  Surgeon: Legrand Rams  C, MD;  Location: [facility] ORS;  Service: Urology;  Laterality: Bilateral;   CYSTOSCOPY W/ RETROGRADES  07/26/2020   Procedure: CYSTOSCOPY WITH RETROGRADE PYELOGRAM;  Surgeon: Sondra Come, MD;  Location: [facility] ORS;  Service: Urology;;   CYSTOSCOPY W/ URETERAL STENT REMOVAL Right 07/26/2020   Procedure: CYSTOSCOPY WITH STENT REMOVAL;  Surgeon: Sondra Come, MD;  Location: [facility] ORS;   Service: Urology;  Laterality: Right;   CYSTOSCOPY/URETEROSCOPY/HOLMIUM LASER/STENT PLACEMENT Bilateral 07/11/2020   Procedure: CYSTOSCOPY/URETEROSCOPY/HOLMIUM LASER/STENT PLACEMENT;  Surgeon: Sondra Come, MD;  Location: [facility] ORS;  Service: Urology;  Laterality: Bilateral;   CYSTOSCOPY/URETEROSCOPY/HOLMIUM LASER/STENT PLACEMENT Left 07/26/2020   Procedure: CYSTOSCOPY/URETEROSCOPY/HOLMIUM LASER/STENT EXCHANGE;  Surgeon: Sondra Come, MD;  Location: [facility] ORS;  Service: Urology;  Laterality: Left;   ESOPHAGOGASTRODUODENOSCOPY (EGD) WITH PROPOFOL N/A 01/03/2018   Procedure: ESOPHAGOGASTRODUODENOSCOPY (EGD) WITH PROPOFOL;  Surgeon: Scot Jun, MD;  Location: [facility] ENDOSCOPY;  Service: Endoscopy;  Laterality: N/A;   EXTRACORPOREAL SHOCK WAVE LITHOTRIPSY Left 07/11/2020   Procedure: EXTRACORPOREAL SHOCK WAVE LITHOTRIPSY (ESWL);  Surgeon: Sondra Come, MD;  Location: [facility] ORS;  Service: Urology;  Laterality: Left;   EYE SURGERY     HERNIA REPAIR     IR NEPHROSTOMY PLACEMENT RIGHT  03/15/2018   kidney stones     lithotripsy   LITHOTRIPSY     NEPHROLITHOTOMY Right 03/15/2018   Procedure: NEPHROLITHOTOMY PERCUTANEOUS;  Surgeon: Riki Altes, MD;  Location: [facility] ORS;  Service: Urology;  Laterality: Right;   STONE EXTRACTION WITH BASKET  07/11/2020   Procedure: BLADDER STONE EXTRACTION;  Surgeon: Sondra Come, MD;  Location: [facility] ORS;  Service: Urology;;   UPPER GI ENDOSCOPY  2013    Social History   Tobacco Use   Smoking status: Never    Passive exposure: Never   Smokeless tobacco: Never  Vaping Use   Vaping status: Never Used  Substance Use Topics   Alcohol use: No    Alcohol/week: 0.0 standard drinks of alcohol   Drug use: No     Medication list has been reviewed and updated.  Current Meds  Medication Sig   acetaminophen (TYLENOL) 500 MG tablet Take 1,000 mg by mouth daily as needed for moderate pain.   aspirin EC 81 MG tablet Take 81 mg by mouth daily.    benazepril-hydrochlorthiazide (LOTENSIN HCT) 10-12.5 MG tablet Take 1 tablet by mouth daily.   Coenzyme Q10 (COQ-10) 100 MG CAPS Take 100 mg by mouth at bedtime.   Dutasteride-Tamsulosin HCl 0.5-0.4 MG CAPS Take 1 capsule by mouth daily.   EPINEPHrine 0.3 mg/0.3 mL IJ SOAJ injection Inject 0.3 mg into the muscle as needed for anaphylaxis.   loratadine (CLARITIN) 10 MG tablet Take 1 tablet (10 mg total) by mouth daily. For allergies   melatonin 5 MG TABS Take 5 mg by mouth.   meloxicam (MOBIC) 15 MG tablet TAKE 1 TABLET BY MOUTH DAILY   metoprolol succinate (TOPROL-XL) 50 MG 24 hr tablet Take 1 tablet (50 mg total) by mouth daily. TAKE WITH OR IMMEDIATELY FOLLOWING A MEAL.   Multiple Vitamin (MULTIVITAMIN WITH MINERALS) TABS tablet Take 1 tablet by mouth at bedtime.   mupirocin ointment (BACTROBAN) 2 % Apply 1 application topically 2 (two) times daily.   nitroGLYCERIN (NITROSTAT) 0.4 MG SL tablet Place 1 tablet (0.4 mg total) under the tongue every 5 (five) minutes as needed for chest pain.   pantoprazole (PROTONIX) 40 MG tablet Take 1 tablet (40 mg total) by mouth daily.   prednisoLONE acetate (PRED  FORTE) 1 % ophthalmic suspension    rosuvastatin (CRESTOR) 5 MG tablet Take 5 mg by mouth daily.   torsemide (DEMADEX) 20 MG tablet Take 20 mg by mouth daily.   triamcinolone (NASACORT) 55 MCG/ACT AERO nasal inhaler Place 2 sprays into the nose daily. otc       11/18/2023    3:25 PM 11/11/2023    2:43 PM 09/14/2023    1:57 PM 03/15/2023    3:01 PM  GAD 7 : Generalized Anxiety Score  Nervous, Anxious, on Edge 0 0 0 0  Control/stop worrying 0 0 0 0  Worry too much - different things 0 0 0 0  Trouble relaxing 0 0 0 0  Restless 0 0 0 0  Easily annoyed or irritable  0 0 0  Afraid - awful might happen 0 0 0 0  Total GAD 7 Score  0 0 0  Anxiety Difficulty Not difficult at all Not difficult at all Not difficult at all Not difficult at all       11/18/2023    3:24 PM 11/11/2023    2:42 PM  09/14/2023    1:57 PM  Depression screen PHQ 2/9  Decreased Interest 1 0 0  Down, Depressed, Hopeless 1 1 0  PHQ - 2 Score 2 1 0  Altered sleeping 0 0 0  Tired, decreased energy 0 1 1  Change in appetite 0 0 0  Feeling bad or failure about yourself  0 0 0  Trouble concentrating 0 0 0  Moving slowly or fidgety/restless 0 0 0  Suicidal thoughts 0 0 0  PHQ-9 Score 2 2 1   Difficult doing work/chores Not difficult at all Not difficult at all Not difficult at all    BP Readings from Last 3 Encounters:  11/18/23 128/80  11/11/23 (!) 164/78  09/14/23 126/64    Physical Exam Vitals and nursing note reviewed.  HENT:     Head: Normocephalic.     Right Ear: External ear normal.     Left Ear: External ear normal.     Nose: Nose normal.  Eyes:     General: No scleral icterus.       Right eye: No discharge.        Left eye: No discharge.     Conjunctiva/sclera: Conjunctivae normal.     Pupils: Pupils are equal, round, and reactive to light.  Neck:     Thyroid: No thyromegaly.     Vascular: No JVD.     Trachea: No tracheal deviation.  Cardiovascular:     Rate and Rhythm: Normal rate and regular rhythm.     Heart sounds: Normal heart sounds. No murmur heard.    No friction rub. No gallop.  Pulmonary:     Effort: No respiratory distress.     Breath sounds: Normal breath sounds. No decreased breath sounds, wheezing, rhonchi or rales.  Abdominal:     General: Bowel sounds are normal.     Palpations: Abdomen is soft. There is no mass.     Tenderness: There is no abdominal tenderness. There is no guarding or rebound.  Musculoskeletal:     Cervical back: Normal range of motion and neck supple.  Lymphadenopathy:     Cervical: No cervical adenopathy.  Skin:    General: Skin is warm.  Neurological:     Mental Status: He is alert.     Wt Readings from Last 3 Encounters:  11/18/23 194 lb (88 kg)  11/11/23 197 lb (89.4  kg)  03/18/23 204 lb (92.5 kg)    BP 128/80   Pulse 79    Ht 5\' 7"  (1.702 m)   Wt 194 lb (88 kg)   SpO2 95%   BMI 30.38 kg/m   Assessment and Plan:  1. Upper GI bleed (Primary) New onset.  Resolved.  Stable.  Patient has resolved any hematemesis or melena since recent brief hospitalization.  Patient underwent endoscopy and no active bleeding site was noted at that time.  Patient is feeling better and is currently doing well without iron supplementation.  We will check CBC and retake count to see if this is progressing in a positive direction.  2. Microcytic anemia As noted previous patient had a microcytic anemia which was slow and developing due to increased naproxen use.  We will check CBC and reticulocyte count for current status. - CBC with Differential/Platelet - Reticulocytes; Future  3. Lumbar back pain New onset.  Patient has had lumbar pain for which he took an extraordinary amount of naproxen.  We will refer to orthopedics for evaluation and x-ray previously noted mild lower lumbar spondylosis. - Ambulatory referral to Orthopedic Surgery    Elizabeth Sauer, MD

## 2023-11-18 NOTE — Addendum Note (Signed)
Addended by: Elizabeth Sauer C on: 11/18/2023 04:00 PM   Modules accepted: Orders

## 2023-11-19 ENCOUNTER — Other Ambulatory Visit: Payer: Self-pay | Admitting: Family Medicine

## 2023-11-19 ENCOUNTER — Encounter: Payer: Self-pay | Admitting: Family Medicine

## 2023-11-19 LAB — CBC WITH DIFFERENTIAL/PLATELET
Basophils Absolute: 0.1 10*3/uL (ref 0.0–0.2)
Basos: 1 %
EOS (ABSOLUTE): 0.2 10*3/uL (ref 0.0–0.4)
Eos: 2 %
Hematocrit: 33.3 % — ABNORMAL LOW (ref 37.5–51.0)
Hemoglobin: 9.8 g/dL — ABNORMAL LOW (ref 13.0–17.7)
Immature Grans (Abs): 0 10*3/uL (ref 0.0–0.1)
Immature Granulocytes: 0 %
Lymphocytes Absolute: 1.7 10*3/uL (ref 0.7–3.1)
Lymphs: 26 %
MCH: 22.9 pg — ABNORMAL LOW (ref 26.6–33.0)
MCHC: 29.4 g/dL — ABNORMAL LOW (ref 31.5–35.7)
MCV: 78 fL — ABNORMAL LOW (ref 79–97)
Monocytes Absolute: 0.8 10*3/uL (ref 0.1–0.9)
Monocytes: 12 %
Neutrophils Absolute: 3.9 10*3/uL (ref 1.4–7.0)
Neutrophils: 59 %
Platelets: 374 10*3/uL (ref 150–450)
RBC: 4.28 x10E6/uL (ref 4.14–5.80)
RDW: 19.6 % — ABNORMAL HIGH (ref 11.6–15.4)
WBC: 6.7 10*3/uL (ref 3.4–10.8)

## 2023-11-19 LAB — RETICULOCYTES: Retic Ct Pct: 2.4 % (ref 0.6–2.6)

## 2023-11-19 MED ORDER — IRON (FERROUS SULFATE) 325 (65 FE) MG PO TABS: 325.0000 mg | ORAL_TABLET | Freq: Every day | ORAL | 5 refills | Status: AC

## 2023-11-23 DIAGNOSIS — Z951 Presence of aortocoronary bypass graft: Secondary | ICD-10-CM | POA: Diagnosis not present

## 2023-11-23 DIAGNOSIS — I10 Essential (primary) hypertension: Secondary | ICD-10-CM | POA: Diagnosis not present

## 2023-11-23 DIAGNOSIS — E669 Obesity, unspecified: Secondary | ICD-10-CM | POA: Diagnosis not present

## 2023-11-23 DIAGNOSIS — G4733 Obstructive sleep apnea (adult) (pediatric): Secondary | ICD-10-CM | POA: Diagnosis not present

## 2023-11-23 DIAGNOSIS — R600 Localized edema: Secondary | ICD-10-CM | POA: Diagnosis not present

## 2023-11-23 DIAGNOSIS — K219 Gastro-esophageal reflux disease without esophagitis: Secondary | ICD-10-CM | POA: Diagnosis not present

## 2023-11-23 DIAGNOSIS — I25118 Atherosclerotic heart disease of native coronary artery with other forms of angina pectoris: Secondary | ICD-10-CM | POA: Diagnosis not present

## 2023-11-23 DIAGNOSIS — R0789 Other chest pain: Secondary | ICD-10-CM | POA: Diagnosis not present

## 2023-11-23 DIAGNOSIS — E782 Mixed hyperlipidemia: Secondary | ICD-10-CM | POA: Diagnosis not present

## 2023-12-01 DIAGNOSIS — S39012A Strain of muscle, fascia and tendon of lower back, initial encounter: Secondary | ICD-10-CM | POA: Diagnosis not present

## 2023-12-01 DIAGNOSIS — M5136 Other intervertebral disc degeneration, lumbar region with discogenic back pain only: Secondary | ICD-10-CM | POA: Diagnosis not present

## 2023-12-08 DIAGNOSIS — M1711 Unilateral primary osteoarthritis, right knee: Secondary | ICD-10-CM | POA: Diagnosis not present

## 2023-12-17 ENCOUNTER — Other Ambulatory Visit (INDEPENDENT_AMBULATORY_CARE_PROVIDER_SITE_OTHER): Payer: Self-pay | Admitting: Radiology

## 2023-12-17 ENCOUNTER — Encounter: Payer: Self-pay | Admitting: Family Medicine

## 2023-12-17 ENCOUNTER — Ambulatory Visit: Payer: Medicare PPO | Admitting: Family Medicine

## 2023-12-17 VITALS — BP 138/74 | HR 84 | Ht 67.0 in | Wt 199.2 lb

## 2023-12-17 DIAGNOSIS — M1711 Unilateral primary osteoarthritis, right knee: Secondary | ICD-10-CM

## 2023-12-22 NOTE — Assessment & Plan Note (Signed)
Here for scheduled Monovisc injection to right knee.

## 2023-12-22 NOTE — Patient Instructions (Signed)
You have just been given a gel lubricant injection to reduce knee pain. You can resume normal activities but avoid anything strenuous that puts excess strain on the joint for 48 hours. Avoid activities such as jogging, soccer, tennis, heavy lifting, or standing on your feet for a long time. If you do have pain, simply rest the joint and use ice. If you can tolerate over the counter medications, you can try Tylenol, Aleve, or Advil for added relief per package instructions. - The gel can be repeated in 6 months, contact our office to schedule or for any questions

## 2023-12-22 NOTE — Progress Notes (Signed)
     Primary Care / Sports Medicine Office Visit  Patient Information:  Patient ID: [name], male DOB: 10/30/1946 Age: 78 y.o. MRN: [id number]   Timothy Hatfield is a pleasant 78 y.o. male presenting with the following:  Chief Complaint  Patient presents with   Knee Pain    Patient presents today for his monovisc injection for his right knee.    Vitals:   12/17/23 1409  BP: 138/74  Pulse: 84  SpO2: 96%   Vitals:   12/17/23 1409  Weight: 199 lb 3.2 oz (90.4 kg)  Height: 5\' 7"  (1.702 m)   Body mass index is 31.2 kg/m.  No results found.   Independent interpretation of notes and tests performed by another provider:   None  Procedures performed:   Procedure:  Injection of right knee under ultrasound guidance. Ultrasound guidance utilized for  Samsung HS60 device utilized with permanent recording / reporting. Verbal informed consent obtained and verified. Skin prepped in a sterile fashion. Ethyl chloride for topical local analgesia.  Completed without difficulty and tolerated well. Medication: Monovisc 88 mg (4 mL) Advised to contact for fevers/chills, erythema, induration, drainage, or persistent bleeding.   Pertinent History, Exam, Impression, and Recommendations:   Problem List Items Addressed This Visit     Primary osteoarthritis of one knee, right - Primary   Here for scheduled Monovisc injection to right knee.      Relevant Orders   [place] LIMITED JOINT SPACE STRUCTURES LOW RIGHT     Orders & Medications Medications: No orders of the defined types were placed in this encounter.  Orders Placed This Encounter  Procedures   [place] LIMITED JOINT SPACE STRUCTURES LOW RIGHT     No follow-ups on file.     Jerrol Banana, MD, [facility]   Primary Care Sports Medicine Primary Care and Sports Medicine at [facility]

## 2023-12-25 DIAGNOSIS — M1711 Unilateral primary osteoarthritis, right knee: Secondary | ICD-10-CM

## 2023-12-25 HISTORY — DX: Unilateral primary osteoarthritis, right knee: M17.11

## 2024-01-09 NOTE — Discharge Instructions (Addendum)
 Instructions after Total Knee Replacement   Timothy P. Angie Fava., M.D.    Dept. of Orthopaedics & Sports Medicine [facility] [address] [place], [place]  [address]  Phone: [phone]   Fax: [phone]       [email or web address]       DIET: Drink plenty of non-alcoholic fluids. Resume your normal diet. Include foods high in fiber.  ACTIVITY:  You may use crutches or a walker with weight-bearing as tolerated, unless instructed otherwise. You may be weaned off of the walker or crutches by your Physical Therapist.  Do NOT place pillows under the knee. Anything placed under the knee could limit your ability to straighten the knee.   Continue doing gentle exercises. Exercising will reduce the pain and swelling, increase motion, and prevent muscle weakness.   Please continue to use the TED compression stockings for 6 weeks. You may remove the stockings at night, but should reapply them in the morning. Do not drive or operate any equipment until instructed.  WOUND CARE:  Continue to use the PolarCare or ice packs periodically to reduce pain and swelling. You may bathe or shower after the staples are removed at the first office visit following surgery.  MEDICATIONS: You may resume your regular medications. Please take the pain medication as prescribed on the medication. Do not take pain medication on an empty stomach. You have been given a prescription for a blood thinner (Lovenox or Coumadin). Please take the medication as instructed. (NOTE: After completing a 2 week course of Lovenox, take one Enteric-coated aspirin once a day. This along with elevation will help reduce the possibility of phlebitis in your operated leg.) Do not drive or drink alcoholic beverages when taking pain medications.  CALL THE OFFICE FOR: Temperature above 101 degrees Excessive bleeding or drainage on the dressing. Excessive swelling, coldness, or paleness of the toes. Persistent nausea and  vomiting.  FOLLOW-UP:  You should have an appointment to return to the office in 10-14 days after surgery. Arrangements have been made for continuation of Physical Therapy (either home therapy or outpatient therapy).     [facility] Department Directory         [email or web address]       [email or web address]          Cardiology  Appointments: [place] - [phone]  Endocrinology  Appointments: [place] [phone] [place] - [phone]  Gastroenterology  Appointments: [place] [phone] [place] - [phone]        General Surgery   Appointments: [facility]  Internal Medicine/Family Medicine  Appointments: [facility] [place] - [phone] [place] - [phone]  Metabolic and Weigh Loss Surgery  Appointments: [facility]        Neurology  Appointments: [place] [phone] [place] - [phone]  Neurosurgery  Appointments: [place]  Obstetrics & Gynecology  Appointments: [place] [phone] [place] - [phone]        Pediatrics  Appointments: [place] [phone] [place] - [phone]  Physiatry  Appointments: [place] [phone]  Physical Therapy  Appointments: [place] - [phone]        Podiatry  Appointments: [place] [phone] [place] - [phone]  Pulmonology  Appointments: [place]  Rheumatology  Appointments: [place] [phone]        [place] Location: [facility]  [address] [place], [place]  [address]  [place] Location: [facility] [address] [place], [place]  [address]  [place] Location: [facility] [address]  [address] [place], [place]  [address]      [employer]  [address] , [place], [place], [address]. [phone] They will call you to arrange when they can come to see you

## 2024-01-10 ENCOUNTER — Encounter
Admission: RE | Admit: 2024-01-10 | Discharge: 2024-01-10 | Disposition: A | Discharge status: Home / Self Care | Payer: Medicare PPO | Source: Ambulatory Visit | Attending: Orthopedic Surgery | Admitting: Orthopedic Surgery

## 2024-01-10 ENCOUNTER — Other Ambulatory Visit: Payer: Self-pay

## 2024-01-10 VITALS — BP 135/88 | HR 77 | Temp 98.0°F | Resp 18 | Ht 67.0 in | Wt 200.0 lb

## 2024-01-10 DIAGNOSIS — K529 Noninfective gastroenteritis and colitis, unspecified: Secondary | ICD-10-CM | POA: Diagnosis not present

## 2024-01-10 DIAGNOSIS — N200 Calculus of kidney: Secondary | ICD-10-CM | POA: Insufficient documentation

## 2024-01-10 DIAGNOSIS — R8290 Unspecified abnormal findings in urine: Secondary | ICD-10-CM | POA: Insufficient documentation

## 2024-01-10 DIAGNOSIS — Z01812 Encounter for preprocedural laboratory examination: Secondary | ICD-10-CM | POA: Insufficient documentation

## 2024-01-10 DIAGNOSIS — R8271 Bacteriuria: Secondary | ICD-10-CM | POA: Insufficient documentation

## 2024-01-10 DIAGNOSIS — N401 Enlarged prostate with lower urinary tract symptoms: Secondary | ICD-10-CM | POA: Diagnosis not present

## 2024-01-10 DIAGNOSIS — K219 Gastro-esophageal reflux disease without esophagitis: Secondary | ICD-10-CM | POA: Diagnosis not present

## 2024-01-10 DIAGNOSIS — M1711 Unilateral primary osteoarthritis, right knee: Secondary | ICD-10-CM | POA: Diagnosis not present

## 2024-01-10 DIAGNOSIS — R8281 Pyuria: Secondary | ICD-10-CM | POA: Diagnosis not present

## 2024-01-10 DIAGNOSIS — Z01818 Encounter for other preprocedural examination: Secondary | ICD-10-CM | POA: Diagnosis present

## 2024-01-10 HISTORY — DX: Chronic kidney disease, stage 3a: N18.31

## 2024-01-10 HISTORY — DX: Unspecified cataract: H26.9

## 2024-01-10 HISTORY — DX: Barrett's esophagus without dysplasia: K22.70

## 2024-01-10 HISTORY — DX: Atherosclerotic heart disease of native coronary artery without angina pectoris: I25.10

## 2024-01-10 HISTORY — DX: Obstructive sleep apnea (adult) (pediatric): G47.33

## 2024-01-10 HISTORY — DX: Essential (primary) hypertension: I10

## 2024-01-10 LAB — COMPREHENSIVE METABOLIC PANEL
ALT: 15 U/L (ref 0–44)
AST: 24 U/L (ref 15–41)
Albumin: 4.2 g/dL (ref 3.5–5.0)
Alkaline Phosphatase: 37 U/L — ABNORMAL LOW (ref 38–126)
Anion gap: 14 (ref 5–15)
BUN: 17 mg/dL (ref 8–23)
CO2: 25 mmol/L (ref 22–32)
Calcium: 9.4 mg/dL (ref 8.9–10.3)
Chloride: 100 mmol/L (ref 98–111)
Creatinine, Ser: 1.17 mg/dL (ref 0.61–1.24)
GFR, Estimated: >60 mL/min (ref >60)
Glucose, Bld: 136 mg/dL — ABNORMAL HIGH (ref 70–99)
Potassium: 3.4 mmol/L — ABNORMAL LOW (ref 3.5–5.1)
Sodium: 139 mmol/L (ref 135–145)
Total Bilirubin: 0.7 mg/dL (ref 0.0–1.2)
Total Protein: 7.6 g/dL (ref 6.5–8.1)

## 2024-01-10 LAB — CBC
HCT: 41.6 % (ref 39.0–52.0)
Hemoglobin: 13.4 g/dL (ref 13.0–17.0)
MCH: 28.3 pg (ref 26.0–34.0)
MCHC: 32.2 g/dL (ref 30.0–36.0)
MCV: 87.8 fL (ref 80.0–100.0)
Platelets: 254 10*3/uL (ref 150–400)
RBC: 4.74 MIL/uL (ref 4.22–5.81)
RDW: 22.5 % — ABNORMAL HIGH (ref 11.5–15.5)
WBC: 6.5 10*3/uL (ref 4.0–10.5)
nRBC: 0 % (ref 0.0–0.2)

## 2024-01-10 LAB — URINALYSIS, ROUTINE W REFLEX MICROSCOPIC
Bilirubin Urine: NEGATIVE
Glucose, UA: NEGATIVE mg/dL
Hgb urine dipstick: NEGATIVE
Ketones, ur: NEGATIVE mg/dL
Nitrite: NEGATIVE
Protein, ur: NEGATIVE mg/dL
Specific Gravity, Urine: 1.006 (ref 1.005–1.030)
WBC, UA: >50 WBC/hpf (ref 0–5)
pH: 5 (ref 5.0–8.0)

## 2024-01-10 LAB — SEDIMENTATION RATE: Sed Rate: 11 mm/h (ref 0–20)

## 2024-01-10 LAB — C-REACTIVE PROTEIN: CRP: <0.5 mg/dL (ref <1.0)

## 2024-01-10 LAB — SURGICAL PCR SCREEN
MRSA, PCR: NEGATIVE
Staphylococcus aureus: NEGATIVE

## 2024-01-10 NOTE — Patient Instructions (Addendum)
 Your procedure is scheduled on: Monday, February 24 Report to the Registration Desk on the 1st floor of the [facility] To find out your arrival time, please call [phone] between 1PM - 3PM on: Friday, February 21 If your arrival time is 6:00 am, do not arrive before that time as the [facility] entrance doors do not open until 6:00 am.  REMEMBER: Instructions that are not followed completely may result in serious medical risk, up to and including death; or upon the discretion of your surgeon and anesthesiologist your surgery may need to be rescheduled.  Do not eat food after midnight the night before surgery.  No gum chewing or hard candies.  You may however, drink CLEAR liquids up to 2 hours before you are scheduled to arrive for your surgery. Do not drink anything within 2 hours of your scheduled arrival time.  Clear liquids include: - water  - apple juice without pulp - gatorade (not RED colors) - black coffee or tea (Do NOT add milk or creamers to the coffee or tea) Do NOT drink anything that is not on this list.  In addition, your doctor has ordered for you to drink the provided:  Ensure Pre-Surgery Clear Carbohydrate Drink  Drinking this carbohydrate drink up to two hours before surgery helps to reduce insulin resistance and improve patient outcomes. Please complete drinking 2 hours before scheduled arrival time.  One week prior to surgery: starting today, February 17 Stop Anti-inflammatories (NSAIDS) such as Advil, Aleve, Ibuprofen, Motrin, Naproxen, Naprosyn and Aspirin based products such as Excedrin, Goody's Powder, BC Powder. Stop ANY OVER THE COUNTER supplements until after surgery. Stop melatonin, osteo biflex.  You may however, continue to take Tylenol if needed for pain up until the day of surgery.  Continue taking all of your other prescription medications up until the day of surgery, including the 81 mg aspirin.  ON THE DAY OF SURGERY ONLY TAKE THESE  MEDICATIONS WITH SIPS OF WATER:  isosorbide mononitrate  Dutasteride-Tamsulosin  metoprolol succinate  pantoprazole (PROTONIX)  rosuvastatin (CRESTOR)   No Alcohol for 24 hours before or after surgery.  No Smoking including e-cigarettes for 24 hours before surgery.  No chewable tobacco products for at least 6 hours before surgery.  No nicotine patches on the day of surgery.  Do not use any "recreational" drugs for at least a week (preferably 2 weeks) before your surgery.  Please be advised that the combination of cocaine and anesthesia may have negative outcomes, up to and including death. If you test positive for cocaine, your surgery will be cancelled.  On the morning of surgery brush your teeth with toothpaste and water, you may rinse your mouth with mouthwash if you wish. Do not swallow any toothpaste or mouthwash.  Use CHG Soap as directed on instruction sheet.  Do not wear jewelry, make-up, hairpins, clips or nail polish.  For welded (permanent) jewelry: bracelets, anklets, waist bands, etc.  Please have this removed prior to surgery.  If it is not removed, there is a chance that hospital personnel will need to cut it off on the day of surgery.  Do not wear lotions, powders, or perfumes.   Do not shave body hair from the neck down 48 hours before surgery.  Contact lenses, hearing aids and dentures may not be worn into surgery.  Do not bring valuables to the hospital. [facility] is not responsible for any missing/lost belongings or valuables.   Bring your C-PAP to the hospital in case  you may have to spend the night.   Notify your doctor if there is any change in your medical condition (cold, fever, infection).  Wear comfortable clothing (specific to your surgery type) to the hospital.  After surgery, you can help prevent lung complications by doing breathing exercises.  Take deep breaths and cough every 1-2 hours. Your doctor may order a device called an Incentive  Spirometer to help you take deep breaths.  If you are being admitted to the hospital overnight, leave your suitcase in the car. After surgery it may be brought to your room.  In case of increased patient census, it may be necessary for you, the patient, to continue your postoperative care in the Same Day Surgery department.  If you are being discharged the day of surgery, you will not be allowed to drive home. You will need a responsible individual to drive you home and stay with you for 24 hours after surgery.   If you are taking public transportation, you will need to have a responsible individual with you.  Please call the Pre-admissions Testing Dept. at [phone] if you have any questions about these instructions.  Surgery Visitation Policy:  Patients having surgery or a procedure may have two visitors.  Children under the age of 44 must have an adult with them who is not the patient.  Temporary Visitor Restrictions Due to increasing cases of flu, RSV and COVID-19: Children ages 59 and under will not be able to visit patients in [facility] hospitals under most circumstances.  Inpatient Visitation:    Visiting hours are 7 a.m. to 8 p.m. Up to four visitors are allowed at one time in a patient room. The visitors may rotate out with other people during the day.  One visitor age 22 or older may stay with the patient overnight and must be in the room by 8 p.m.       Pre-operative 5 CHG Bath Instructions   You can play a key role in reducing the risk of infection after surgery. Your skin needs to be as free of germs as possible. You can reduce the number of germs on your skin by washing with CHG (chlorhexidine gluconate) soap before surgery. CHG is an antiseptic soap that kills germs and continues to kill germs even after washing.   DO NOT use if you have an allergy to chlorhexidine/CHG or antibacterial soaps. If your skin becomes reddened or irritated, stop using the CHG and  notify one of our RNs at [phone].   Please shower with the CHG soap starting 4 days before surgery using the following schedule:     Please keep in mind the following:  DO NOT shave, including legs and underarms, starting the day of your first shower.   You may shave your face at any point before/day of surgery.  Place clean sheets on your bed the day you start using CHG soap. Use a clean washcloth (not used since being washed) for each shower. DO NOT sleep with pets once you start using the CHG.   CHG Shower Instructions:  If you choose to wash your hair and private area, wash first with your normal shampoo/soap.  After you use shampoo/soap, rinse your hair and body thoroughly to remove shampoo/soap residue.  Turn the water OFF and apply about 3 tablespoons (45 ml) of CHG soap to a CLEAN washcloth.  Apply CHG soap ONLY FROM YOUR NECK DOWN TO YOUR TOES (washing for 3-5 minutes)  DO NOT use  CHG soap on face, private areas, open wounds, or sores.  Pay special attention to the area where your surgery is being performed.  If you are having back surgery, having someone wash your back for you may be helpful. Wait 2 minutes after CHG soap is applied, then you may rinse off the CHG soap.  Pat dry with a clean towel  Put on clean clothes/pajamas   If you choose to wear lotion, please use ONLY the CHG-compatible lotions on the back of this paper.     Additional instructions for the day of surgery: DO NOT APPLY any lotions, deodorants, cologne, or perfumes.   Put on clean/comfortable clothes.  Brush your teeth.  Ask your nurse before applying any prescription medications to the skin.      CHG Compatible Lotions   Aveeno Moisturizing lotion  Cetaphil Moisturizing Cream  Cetaphil Moisturizing Lotion  Clairol Herbal Essence Moisturizing Lotion, Dry Skin  Clairol Herbal Essence Moisturizing Lotion, Extra Dry Skin  Clairol Herbal Essence Moisturizing Lotion, Normal Skin  Curel Age  Defying Therapeutic Moisturizing Lotion with Alpha Hydroxy  Curel Extreme Care Body Lotion  Curel Soothing Hands Moisturizing Hand Lotion  Curel Therapeutic Moisturizing Cream, Fragrance-Free  Curel Therapeutic Moisturizing Lotion, Fragrance-Free  Curel Therapeutic Moisturizing Lotion, Original Formula  Eucerin Daily Replenishing Lotion  Eucerin Dry Skin Therapy Plus Alpha Hydroxy Crme  Eucerin Dry Skin Therapy Plus Alpha Hydroxy Lotion  Eucerin Original Crme  Eucerin Original Lotion  Eucerin Plus Crme Eucerin Plus Lotion  Eucerin TriLipid Replenishing Lotion  Keri Anti-Bacterial Hand Lotion  Keri Deep Conditioning Original Lotion Dry Skin Formula Softly Scented  Keri Deep Conditioning Original Lotion, Fragrance Free Sensitive Skin Formula  Keri Lotion Fast Absorbing Fragrance Free Sensitive Skin Formula  Keri Lotion Fast Absorbing Softly Scented Dry Skin Formula  Keri Original Lotion  Keri Skin Renewal Lotion Keri Silky Smooth Lotion  Keri Silky Smooth Sensitive Skin Lotion  Nivea Body Creamy Conditioning Oil  Nivea Body Extra Enriched Lotion  Nivea Body Original Lotion  Nivea Body Sheer Moisturizing Lotion Nivea Crme  Nivea Skin Firming Lotion  NutraDerm 30 Skin Lotion  NutraDerm Skin Lotion  NutraDerm Therapeutic Skin Cream  NutraDerm Therapeutic Skin Lotion  ProShield Protective Hand Cream  Provon moisturizing lotion      Preoperative Educational Videos for Total Hip, Knee and Shoulder Replacements  To better prepare for surgery, please view our videos that explain the physical activity and discharge planning required to have the best surgical recovery at [facility].  [email or web address]  Questions? Call [phone] or email [email or web address]

## 2024-01-11 ENCOUNTER — Other Ambulatory Visit: Payer: Self-pay | Admitting: Family Medicine

## 2024-01-11 DIAGNOSIS — K2270 Barrett's esophagus without dysplasia: Secondary | ICD-10-CM

## 2024-01-12 NOTE — Progress Notes (Signed)
  [facility] Perioperative Services: Pre-Admission/Anesthesia Testing  Abnormal Lab Notification   Date: 01/12/24  Name: Timothy Hatfield MRN:   [id number]  Re: Abnormal labs noted during PAT appointment   Notified:  Provider Name Provider Role Notification Mode  Francesco Sor, MD Orthopedics (Surgeon) Routed and/or faxed via [facility]   Abnormal Lab Value(s):   Lab Results  Component Value Date   COLORURINE STRAW (A) 01/10/2024   APPEARANCEUR CLEAR (A) 01/10/2024   LABSPEC 1.006 01/10/2024   PHURINE 5.0 01/10/2024   GLUCOSEU NEGATIVE 01/10/2024   HGBUR NEGATIVE 01/10/2024   BILIRUBINUR NEGATIVE 01/10/2024   KETONESUR NEGATIVE 01/10/2024   PROTEINUR NEGATIVE 01/10/2024   UROBILINOGEN 0.2 07/24/2022   NITRITE NEGATIVE 01/10/2024   LEUKOCYTESUR MODERATE (A) 01/10/2024   EPIU 0-5 01/10/2024   WBCU >50 01/10/2024   RBCU 0-5 01/10/2024   BACTERIA RARE (A) 01/10/2024   CULT 80,000 COLONIES/mL ENTEROCOCCUS FAECALIS (A) 01/10/2024    Clinical Information and Notes:  Patient is scheduled for COMPUTER ASSISTED TOTAL KNEE ARTHROPLASTY (Right: Knee) on 01/17/2024.    UA performed in PAT consistent with/concerning for infection.  No leukocytosis noted on CBC; WBC 6.5 Renal function: Estimated Creatinine Clearance: 55.9 mL/min (by C-G formula based on SCr of 1.17 mg/dL). Urine C&S added to assess for pathogenically significant growth.  Impression and Plan:  Timothy Hatfield with a UA that was (+) for infection; reflex culture sent. Preliminary culture (+) for significant Enterococcus faecalis colony count; final pathogen susceptibilities pending. Will plan on forwarding final culture results to MD as they become available to me. Sending results for review and consideration of preoperative treatment as deemed appropriate by Dr. Ernest Pine.   Quentin Mulling, MSN, APRN, FNP-C, CEN [facility]  Perioperative Services Nurse Practitioner Phone: [phone] Fax: [phone] 01/12/24 11:40 AM  NOTE: This note has been prepared using Dragon dictation software. Despite my best ability to proofread, there is always the potential that unintentional transcriptional errors may still occur from this process.

## 2024-01-13 LAB — URINE CULTURE: Culture: 80000 — AB

## 2024-01-14 ENCOUNTER — Encounter: Payer: Self-pay | Admitting: Orthopedic Surgery

## 2024-01-14 DIAGNOSIS — M1711 Unilateral primary osteoarthritis, right knee: Secondary | ICD-10-CM | POA: Diagnosis not present

## 2024-01-14 NOTE — Progress Notes (Signed)
 Perioperative / Anesthesia Services  Pre-Admission Testing Clinical Review / Pre-Operative Anesthesia Consult  Date: 01/14/24  Patient Demographics:  Name: Timothy Hatfield DOB: [birth year] MRN:   [id number]  Planned Surgical Procedure(s):    Case: [id number] Date/Time: 01/17/24 1112   Procedure: COMPUTER ASSISTED TOTAL KNEE ARTHROPLASTY (Right: Knee)   Anesthesia type: Choice   Pre-op diagnosis: PRIMARY OSTEOARTHRITIS OF RIGHT KNEE.   Location: [facility] OR ROOM 01 / [facility]S FOR [facility]   Surgeons: Donato Heinz, MD      NOTE: Available PAT nursing documentation and vital signs have been reviewed. Clinical nursing staff has updated patient's PMH/PSHx, current medication list, and drug allergies/intolerances to ensure comprehensive history available to assist in medical decision making as it pertains to the aforementioned surgical procedure and anticipated anesthetic course. Extensive review of available clinical information personally performed. [place] [facility] and PSHx updated with any diagnoses/procedures that  may have been inadvertently omitted during his intake with the pre-admission testing department's nursing staff.  Clinical Discussion:  Timothy Hatfield is a 78 y.o. male who is submitted for pre-surgical anesthesia review and clearance prior to him undergoing the above procedure. Patient has never been a smoker in the past. Pertinent PMH includes: CAD (s/p CABG), diastolic dysfunction, aortic atherosclerosis, pulmonary hypertension, BILATERAL lower extremity edema, HTN, HLD, CKD-III, elevated RIGHT hemidiaphragm, GERD (on daily PPI), Barrett's esophagus, anemia, nephrolithiasis, OA, BPH, motion sickness, insomnia.   Patient is followed by cardiology Timothy Pares, MD). He was last seen in the cardiology clinic on 11/23/2023; notes reviewed. At the time of his clinic visit, patient doing well overall from a cardiovascular perspective. Patient with GI reflux symptoms.  Additionally, patient with lower extremity edema Patient denied any chest pain, shortness of breath, PND, orthopnea, palpitations, significant peripheral edema, weakness, fatigue, vertiginous symptoms, or presyncope/syncope. Patient with a past medical history significant for cardiovascular diagnoses. Documented physical exam was grossly benign, providing no evidence of acute exacerbation and/or decompensation of the patient's known cardiovascular conditions.  Patient underwent diagnostic LEFT heart catheterization on 09/09/2016. Procedure revealed apical anterior hypokinesis and multivessel CAD; 100% OM1, 70% OM2, 90% mLAD, 90% RI, 75% pRCA, 70% mRCA, and 85% dRCA. Given the degree and complexity of his coronary artery disease, the decision was made to refer patient to CVTS for consultation regarding revascularization.   Patient underwent 4 vessel revascularization procedure on 10/02/2016 at [facility] by Dr. Rochele Pages, MD. LIMA-LAD, SVG-PDA, SVG-OM1, and SVG-OM3 bypass grafts were placed.  Most recent TTE was performed on 07/17/2022 revealed a normal left ventricular systolic function with an EF of >55%. There was mild LVH. Left ventricular diastolic Doppler parameters consistent with abnormal relaxation (G1DD). Right ventricular size and function normal.  PASP = 40 mmHg (TR max velocity: 3.0 m/s) consistent with mild pulmonary hypertension. There was trivial mitral, in addition to mild aortic and tricuspid valve regurgitation. All transvalvular gradients were noted to be normal providing no evidence suggestive of valvular stenosis. Aorta normal in size with no evidence of ectasia or aneurysmal dilatation.  Coronary CTA was performed on 05/29/202. Due to patient's previous revascularization, coronary calcium score unable to be obtained/ not applicable. SVG-PDA was found to be patent. SVG-LAD appeared atretic, but was found to be patent. SVG-OM and LIMA-LAD not well visualized.  Significant RCA stenosis observed proximally. Native LAD and LCx with moderate diease. Study demonstrates normal coronary origin with RIGHT dominance.  Blood pressure reasonably controlled at 146/66 mmHg on currently prescribed ACEi (benazepril), diuretic (HCTZ +  torsemide), nitrate (isosorbide mononitrate), and beta-blocker (metoprolol succinate) therapies. In addition to his scheduled nitrate dose, patient has a supply of short acting nitrates (NTG) to use on a as needed basis for recurrent angina/anginal equivalent symptoms; denied recent use. Patient is on rosuvastatin for his HLD diagnosis and ASCVD prevention. Patient is not diabetic. He does not have an OSAH diagnosis. Patient is able to complete all of his  ADL/IADLs without cardiovascular limitation.  Per the DASI, patient is able to achieve at least 4 METS of physical activity without experiencing any significant degree of angina/anginal equivalent symptoms. No changes were made to his medication regimen during his visit with cardiology.  Patient scheduled to follow-up with outpatient cardiology in 1 year or sooner if needed.  Timothy Hatfield is scheduled for an elective COMPUTER ASSISTED TOTAL KNEE ARTHROPLASTY (Right: Knee) on 01/17/2024 with Dr. Francesco Sor, MD.  Given patient's past medical history significant for cardiovascular diagnoses, presurgical cardiac clearance was sought by the PAT team. Per cardiology, "this patient is optimized for surgery and may proceed with the planned procedural course with a LOW risk of significant perioperative cardiovascular complications".  In review of the patient's chart, it is noted that he is on daily oral antithrombotic therapy. Given his cardiovascular history, orthopedics has cleared patient to continue his daily low dose ASA throughout his perioperative course. Patient has been updated on these directives by the PAT team.   Patient denies previous perioperative complications with anesthesia in the  past. In review his EMR, it is noted that patient underwent a general anesthetic course at [facility] ([facility]) in 10/2023 without documented complications.      01/10/2024    2:18 PM 12/17/2023    2:09 PM 11/18/2023    3:18 PM  Vitals with BMI  Height 5\' 7"  5\' 7"  5\' 7"   Weight 200 lbs 199 lbs 3 oz 194 lbs  BMI 31.32 31.19 30.38  Systolic 135 138 811  Diastolic 88 74 80  Pulse 77 84 79   Providers/Specialists:  NOTE: Primary physician provider listed below. Patient may have been seen by APP or partner within same practice.   PROVIDER ROLE / SPECIALTY LAST OV  Hooten, Illene Labrador, MD Orthopedics (Surgeon) 12/08/2023  Duanne Limerick, MD Primary Care Provider 11/18/2023  Rudean Hitt, MD Cardiology 11/23/2023   Allergies:   Allergies  Allergen Reactions   Bee Venom Anaphylaxis   Ciprofloxacin Itching   Current Home Medications:   No current facility-administered medications for this encounter.    acetaminophen (TYLENOL) 650 MG CR tablet   aspirin EC 81 MG tablet   Dutasteride-Tamsulosin HCl 0.5-0.4 MG CAPS   EPINEPHrine 0.3 mg/0.3 mL IJ SOAJ injection   Iron, Ferrous Sulfate, 325 (65 Fe) MG TABS   isosorbide mononitrate (IMDUR) 30 MG 24 hr tablet   latanoprost (XALATAN) 0.005 % ophthalmic solution   Melatonin 10 MG TABS   metoprolol succinate (TOPROL-XL) 50 MG 24 hr tablet   nitroGLYCERIN (NITROSTAT) 0.4 MG SL tablet   NON FORMULARY   rosuvastatin (CRESTOR) 5 MG tablet   tiZANidine (ZANAFLEX) 4 MG tablet   torsemide (DEMADEX) 20 MG tablet   triamcinolone (NASACORT) 55 MCG/ACT AERO nasal inhaler   benazepril-hydrochlorthiazide (LOTENSIN HCT) 10-12.5 MG tablet   Misc Natural Products (OSTEO BI-FLEX TRIPLE STRENGTH PO)   pantoprazole (PROTONIX) 40 MG tablet   History:   Past Medical History:  Diagnosis Date   Allergy    Anemia    Aortic  atherosclerosis (HCC)    Barrett's esophagus    Bilateral lower extremity edema    BPH  (benign prostatic hyperplasia)    Coronary artery disease involving native coronary artery of native heart 09/09/2016   a.) LHC 09/09/2016: 100% OM1, 70% OM2, 90% mLAD, 90% RI, 75% pRCA, 70% mRCA, 85% dRCA --> refer to CVTS; b.) s/p 4v CABG 10/02/2016   Degenerative arthritis of right knee 12/2023   Diastolic dysfunction    a.) TTE 07/17/2022:   Elevated RIGHT hemidiaphragm    Essential hypertension    GERD (gastroesophageal reflux disease)    History of kidney stones    History of right cataract extraction    Hyperlipidemia    Insomnia    a.) takes melatonin PRN   Long-term use of aspirin therapy    Motion sickness    Obstructive sleep apnea on CPAP    Pneumonia    Pulmonary hypertension (HCC) 07/17/2022   a.) TTE 07/17/2022: PASP 40 mmHg   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   a.) LIMA-LAD, SVG-PDA, SVG-OM1, SVG-OM3   Stage 3a chronic kidney disease (CKD) (HCC)    Statin myopathy    Past Surgical History:  Procedure Laterality Date   CARDIAC CATHETERIZATION Left 09/09/2016   Procedure: Left Heart Cath and Coronary Angiography;  Surgeon: Alwyn Pea, MD;  Location: [facility] INVASIVE CV LAB;  Service: Cardiovascular;  Laterality: Left;   CATARACT EXTRACTION W/ INTRAOCULAR LENS IMPLANT Right    COLONOSCOPY  2013   normal/ Dr Mechele Collin- cleared for 10 yrs   COLONOSCOPY  08/31/2007   COLONOSCOPY WITH PROPOFOL N/A 01/03/2018   Procedure: COLONOSCOPY WITH PROPOFOL;  Surgeon: Scot Jun, MD;  Location: [facility] ENDOSCOPY;  Service: Endoscopy;  Laterality: N/A;   CORONARY ARTERY BYPASS GRAFT N/A 10/02/2016   Procedure: CORONARY ARTERY BYPASS GRAFTING (4 vessels); Location: [facility]; Surgeon: Rochele Pages, MD   CYSTOSCOPY W/ RETROGRADES Bilateral 07/11/2020   Procedure: CYSTOSCOPY WITH RETROGRADE PYELOGRAM;  Surgeon: Sondra Come, MD;  Location: [facility] ORS;  Service: Urology;  Laterality: Bilateral;   CYSTOSCOPY W/ RETROGRADES  07/26/2020   Procedure: CYSTOSCOPY WITH RETROGRADE PYELOGRAM;   Surgeon: Sondra Come, MD;  Location: [facility] ORS;  Service: Urology;;   CYSTOSCOPY W/ URETERAL STENT REMOVAL Right 07/26/2020   Procedure: CYSTOSCOPY WITH STENT REMOVAL;  Surgeon: Sondra Come, MD;  Location: [facility] ORS;  Service: Urology;  Laterality: Right;   CYSTOSCOPY/URETEROSCOPY/HOLMIUM LASER/STENT PLACEMENT Bilateral 07/11/2020   Procedure: CYSTOSCOPY/URETEROSCOPY/HOLMIUM LASER/STENT PLACEMENT;  Surgeon: Sondra Come, MD;  Location: [facility] ORS;  Service: Urology;  Laterality: Bilateral;   CYSTOSCOPY/URETEROSCOPY/HOLMIUM LASER/STENT PLACEMENT Left 07/26/2020   Procedure: CYSTOSCOPY/URETEROSCOPY/HOLMIUM LASER/STENT EXCHANGE;  Surgeon: Sondra Come, MD;  Location: [facility] ORS;  Service: Urology;  Laterality: Left;   ESOPHAGOGASTRODUODENOSCOPY     2001, 2008, 2014   ESOPHAGOGASTRODUODENOSCOPY  11/12/2023   ESOPHAGOGASTRODUODENOSCOPY (EGD) WITH PROPOFOL N/A 01/03/2018   Procedure: ESOPHAGOGASTRODUODENOSCOPY (EGD) WITH PROPOFOL;  Surgeon: Scot Jun, MD;  Location: [facility] ENDOSCOPY;  Service: Endoscopy;  Laterality: N/A;   EXTRACORPOREAL SHOCK WAVE LITHOTRIPSY Left 07/11/2020   Procedure: EXTRACORPOREAL SHOCK WAVE LITHOTRIPSY (ESWL);  Surgeon: Sondra Come, MD;  Location: [facility] ORS;  Service: Urology;  Laterality: Left;   IR NEPHROSTOMY PLACEMENT RIGHT  03/15/2018   LITHOTRIPSY     LITHOTRIPSY     NEPHROLITHOTOMY Right 03/15/2018   Procedure: NEPHROLITHOTOMY PERCUTANEOUS;  Surgeon: Riki Altes, MD;  Location: [facility] ORS;  Service: Urology;  Laterality: Right;   STONE EXTRACTION WITH BASKET  07/11/2020  Procedure: BLADDER STONE EXTRACTION;  Surgeon: Sondra Come, MD;  Location: [facility] ORS;  Service: Urology;;   UMBILICAL HERNIA REPAIR     UPPER GI ENDOSCOPY  2013   Family History  Problem Relation Age of Onset   Arrhythmia Mother    Coronary artery disease Father    Diabetes Father    Hyperlipidemia Brother    Social History   Tobacco Use   Smoking status: Never     Passive exposure: Never   Smokeless tobacco: Never  Substance Use Topics   Alcohol use: No    Alcohol/week: 0.0 standard drinks of alcohol   Pertinent Clinical Results:  LABS:  Hospital Outpatient Visit on 01/10/2024  Component Date Value Ref Range Status   MRSA, PCR 01/10/2024 NEGATIVE  NEGATIVE Final   Staphylococcus aureus 01/10/2024 NEGATIVE  NEGATIVE Final   Comment: (NOTE) The Xpert SA Assay (FDA approved for NASAL specimens in patients 1 years of age and older), is one component of a comprehensive surveillance program. It is not intended to diagnose infection nor to guide or monitor treatment. Performed at [facility], [address] [place], [place] [address]    WBC 01/10/2024 6.5  4.0 - 10.5 K/uL Final   RBC 01/10/2024 4.74  4.22 - 5.81 MIL/uL Final   Hemoglobin 01/10/2024 13.4  13.0 - 17.0 g/dL Final   HCT 40/98/1191 41.6  39.0 - 52.0 % Final   MCV 01/10/2024 87.8  80.0 - 100.0 fL Final   MCH 01/10/2024 28.3  26.0 - 34.0 pg Final   MCHC 01/10/2024 32.2  30.0 - 36.0 g/dL Final   RDW 47/82/9562 22.5 (H)  11.5 - 15.5 % Final   Platelets 01/10/2024 254  150 - 400 K/uL Final   nRBC 01/10/2024 0.0  0.0 - 0.2 % Final   Performed at [facility], [address] [place], [place] [address]   Sodium 01/10/2024 139  135 - 145 mmol/L Final   Potassium 01/10/2024 3.4 (L)  3.5 - 5.1 mmol/L Final   Chloride 01/10/2024 100  98 - 111 mmol/L Final   CO2 01/10/2024 25  22 - 32 mmol/L Final   Glucose, Bld 01/10/2024 136 (H)  70 - 99 mg/dL Final   Glucose reference range applies only to samples taken after fasting for at least 8 hours.   BUN 01/10/2024 17  8 - 23 mg/dL Final   Creatinine, Ser 01/10/2024 1.17  0.61 - 1.24 mg/dL Final   Calcium 57/84/6962 9.4  8.9 - 10.3 mg/dL Final   Total Protein 95/28/4132 7.6  6.5 - 8.1 g/dL Final   Albumin 44/11/270 4.2  3.5 - 5.0 g/dL Final   AST 53/66/4403 24  15 - 41 U/L Final   ALT 01/10/2024 15  0 - 44 U/L Final    Alkaline Phosphatase 01/10/2024 37 (L)  38 - 126 U/L Final   Total Bilirubin 01/10/2024 0.7  0.0 - 1.2 mg/dL Final   GFR, Estimated 01/10/2024 >60  >60 mL/min Final   Comment: (NOTE) Calculated using the CKD-EPI Creatinine Equation (2021)    Anion gap 01/10/2024 14  5 - 15 Final   Performed at [facility], [address] [place], [place] [address]   Color, Urine 01/10/2024 STRAW (A)  YELLOW Final   APPearance 01/10/2024 CLEAR (A)  CLEAR Final   Specific Gravity, Urine 01/10/2024 1.006  1.005 - 1.030 Final   pH 01/10/2024 5.0  5.0 - 8.0 Final   Glucose, UA 01/10/2024 NEGATIVE  NEGATIVE  mg/dL Final   Hgb urine dipstick 01/10/2024 NEGATIVE  NEGATIVE Final   Bilirubin Urine 01/10/2024 NEGATIVE  NEGATIVE Final   Ketones, ur 01/10/2024 NEGATIVE  NEGATIVE mg/dL Final   Protein, ur 56/21/3086 NEGATIVE  NEGATIVE mg/dL Final   Nitrite 57/84/6962 NEGATIVE  NEGATIVE Final   Leukocytes,Ua 01/10/2024 MODERATE (A)  NEGATIVE Final   RBC / HPF 01/10/2024 0-5  0 - 5 RBC/hpf Final   WBC, UA 01/10/2024 >50  0 - 5 WBC/hpf Final   Bacteria, UA 01/10/2024 RARE (A)  NONE SEEN Final   Squamous Epithelial / HPF 01/10/2024 0-5  0 - 5 /HPF Final   Performed at [facility], [address] [place], [place] [address]   CRP 01/10/2024 <0.5  <1.0 mg/dL Final   Performed at [facility], [address], [place], [place] [address]   Sed Rate 01/10/2024 11  0 - 20 mm/hr Final   Performed at [facility], [address] [place] [place], [place] [address]   Specimen Description 01/10/2024    Final                   Value:URINE, CLEAN CATCH Performed at [facility], [address] [place], [place] [address]    Special Requests 01/10/2024    Final                   Value:NONE Performed at [facility], [address] [place], [place] [address]    Culture 01/10/2024 80,000 COLONIES/mL ENTEROCOCCUS FAECALIS (A)   Final   Report Status 01/10/2024 01/13/2024 FINAL    Final   Organism ID, Bacteria 01/10/2024 ENTEROCOCCUS FAECALIS (A)   Final    ECG: Date: 11/11/2023  Time ECG obtained: 1839 PM Rate: 72 bpm Rhythm: normal sinus Axis (leads I and aVF): normal Intervals: PR 180 ms. QRS 94 ms. QTc 451 ms. ST segment and T wave changes: No evidence of acute T wave abnormalities or significant ST segment elevation or depression.  Evidence of a possible, age undetermined, prior infarct:  No Comparison: Similar to previous tracing obtained on 03/25/2023   IMAGING / PROCEDURES: DIAGNOSTIC RADIOGRAPHS OF RIGHT KNEE 3 VIEWS performed on 12/08/2023 Significant narrowing of the medial cartilage space with irregularity of the surface of the medial condyle, near bone-on-bone articulation, and  associated varus alignment.   Osteophyte formation is noted.  Subchondral sclerosis is noted.   No evidence of fracture or dislocation.   TRANSTHORACIC ECHOCARDIOGRAM performed on 07/17/2022 The left ventricle is normal in size with mildly increased wall thickness.  The left ventricular systolic function is normal, LVEF is visually estimated at > 55%.  There is mild aortic regurgitation.  The right ventricle is normal in size, with normal systolic function.  An ultrasound enhancing agent was used to improve the visualization of the left ventricular cavity and endocardial borders.   CT CORONARY MORPH W/CTA COR W/SCORE W/CA W/CM &/OR WO/CM performed on 04/21/2023 No significant incidental extracardiac findings. Prior CABG. Mild atherosclerosis of the aortic arch. Elevation of the right hemidiaphragm. Aortic atherosclerosis  Coronary calcium score not applicable.  Patient is s/p CABG  Normal coronary origin with right dominance.  SVG to RPDA is patent.  SVG to distal LAD appears atretic but patent.  SVG to possibly OM not well visualized.  LIMA not visualized.  Significant RCA stenosis proximally.  Moderate native LAD and LCx disease.  Impression and Plan:   Timothy Hatfield has been referred for pre-anesthesia review  and clearance prior to him undergoing the planned anesthetic and procedural courses. Available labs, pertinent testing, and imaging results were personally reviewed by me in preparation for upcoming operative/procedural course. [facility] medical record has been updated following extensive record review and patient interview with PAT staff.   This patient has been appropriately cleared by cardiology with an overall LOW risk of experiencing significant perioperative cardiovascular complications. Based on clinical review performed today (01/14/24), barring any significant acute changes in the patient's overall condition, it is anticipated that he will be able to proceed with the planned surgical intervention. Any acute changes in clinical condition may necessitate his procedure being postponed and/or cancelled. Patient will meet with anesthesia team (MD and/or CRNA) on the day of his procedure for preoperative evaluation/assessment. Questions regarding anesthetic course will be fielded at that time.   Pre-surgical instructions were reviewed with the patient during his PAT appointment, and questions were fielded to satisfaction by PAT clinical staff. He has been instructed on which medications that he will need to hold prior to surgery, as well as the ones that have been deemed safe/appropriate to take on the day of his procedure. As part of the general education provided by PAT, patient made aware both verbally and in writing, that he would need to abstain from the use of any illegal substances during his perioperative course. He was advised that failure to follow the provided instructions could necessitate case cancellation or result in serious perioperative complications up to and including death. Patient encouraged to contact PAT and/or his surgeon's office to discuss any questions or concerns that may arise prior to surgery; verbalized  understanding.   Quentin Mulling, MSN, APRN, FNP-C, CEN [facility]  Perioperative Services Nurse Practitioner Phone: [phone] Fax: [phone] 01/14/24 3:55 PM  NOTE: This note has been prepared using Dragon dictation software. Despite my best ability to proofread, there is always the potential that unintentional transcriptional errors may still occur from this process.

## 2024-01-16 NOTE — H&P (Signed)
 ORTHOPAEDIC HISTORY & PHYSICAL Timothy Hatfield, [place] - 01/14/2024 3:30 PM EST Formatting of this note is different from the original. NAME: Timothy Hatfield H&P Date: 01/14/2024 Procedure Date: 01/17/2024  Chief Complaint: right knee pain and swelling  HPI [name] is a 78 y.o. male who has severe right knee pain. He states that he has had progressive right knee pain that has persisted for a little over the last year. He states most the pain localizes along the medial aspect of the knee. He does report intermittent swelling, but denies any locking or giving way of the knee. He states that his symptoms are made worse with any prolonged weightbearing, ambulation or use of stairs. It has begun to affect his ability to ambulate long distances and perform his ADLs. He has failed conservative treatment including Tylenol, NSAIDs, intra-articular corticosteroid injections, glucosamine, viscosupplementation and activity modification. He does not currently utilize any ambulatory aids . He has requested operative intervention for relief of his DJD symptoms. Of note, patient has a significant cardiac history where he had a quadruple bypass performed 7 years ago. He denies any pulmonary issues, history of DVTs or clots. Denies any diabetes. Denies any previous history on his right knee  Social Hx: Patient lives at home alone. He is a retired Runner, broadcasting/film/video. States that he has a daughter and 2 grandkids who are around the area to help with his recovery. He denies any alcohol, illicit drug use. No smoking or nicotine use.  Medications & Allergies Allergies: Allergies Allergen Reactions Bee Venom Protein (Honey Bee) Anaphylaxis Has epi- pen on hand Ciprofloxacin Itching  Home Medicines: Current Outpatient Medications on File Prior to Visit Medication Sig Dispense Refill acetaminophen (TYLENOL) 500 MG tablet Take 2 tablets (1,000 mg total) by mouth every 6 (six) hours as needed for Pain aspirin 81 MG  EC tablet Take 1 tablet (81 mg total) by mouth once daily benazepril-hydrochlorthiazide (LOTENSIN HCT) 10-12.5 mg tablet Take 1 tablet by mouth once daily bisacodyl (DULCOLAX) 5 mg EC tablet Take 1 tablet (5 mg total) by mouth once daily as needed for Constipation dutasteride-tamsulosin (JALYN) 0.5-0.4 mg capsule Take 1 capsule by mouth 3 (three) times a week MWF EPINEPHrine (EPIPEN) 0.3 mg/0.3 mL pen injector Inject into the muscle. isosorbide mononitrate (IMDUR) 30 MG ER tablet Take 1 tablet (30 mg total) by mouth once daily 30 tablet 11 metoprolol succinate (TOPROL-XL) 50 MG XL tablet Take 1 tablet (50 mg total) by mouth once daily 90 tablet 3 mometasone (NASONEX) 50 mcg/actuation nasal spray Place 1 spray into both nostrils once daily naproxen sodium (ALEVE) 220 MG tablet Take by mouth nitroGLYcerin (NITROSTAT) 0.4 MG SL tablet Place 1 tablet (0.4 mg total) under the tongue every 5 (five) minutes as needed for Chest pain May take up to 3 doses. pantoprazole (PROTONIX) 40 MG DR tablet Take 1 tablet (40 mg total) by mouth once daily rosuvastatin (CRESTOR) 5 MG tablet take 1 tablet by mouth once daily. 90 tablet 3 sennosides-docusate (SENOKOT-S) 8.6-50 mg tablet Take 1 tablet by mouth once daily tiZANidine (ZANAFLEX) 4 MG tablet Take 1 tablet (4 mg total) by mouth at bedtime as needed 30 tablet 1 TORsemide (DEMADEX) 20 MG tablet Take 1 tablet (20 mg total) by mouth once daily 30 tablet 11 triamcinolone (NASACORT AQ) 55 mcg nasal spray Place into one nostril benazepriL (LOTENSIN) 10 MG tablet Take 10 mg by mouth once daily (Patient not taking: Reported on 01/14/2024) co-enzyme Q-10, ubiquinone, 30 mg capsule Take 1 capsule (  30 mg total) by mouth once daily (Patient not taking: Reported on 01/14/2024) etodolac (LODINE) 400 MG tablet Take 1 tablet (400 mg total) by mouth 2 (two) times daily (Patient not taking: Reported on 01/14/2024) folic acid (FOLVITE) 1 MG tablet Take 1 tablet (1 mg total) by mouth  once daily (Patient not taking: Reported on 01/14/2024) glucosamine su 2KCl-chondroit 500-400 mg Tab Take 1 tablet by mouth once daily (Patient not taking: Reported on 01/14/2024) hydrocortisone (ANUSOL-HC) 2.5 % rectal cream Apply topically as needed. (Patient not taking: Reported on 01/14/2024) hydrOXYzine HCL (ATARAX) 10 MG tablet (Patient not taking: Reported on 01/14/2024) loratadine (CLARITIN) 10 mg tablet Take by mouth (Patient not taking: Reported on 01/14/2024) meloxicam (MOBIC) 15 MG tablet (Patient not taking: Reported on 01/14/2024) nortriptyline (PAMELOR) 10 MG capsule Take 2 capsules (20 mg total) by mouth nightly 180 capsule 3 sertraline (ZOLOFT) 50 MG tablet Take 1 tablet (50 mg total) by mouth once daily (Patient not taking: Reported on 01/14/2024) tadalafiL (CIALIS) 10 MG tablet Take 10 mg by mouth once daily as needed for Erectile Dysfunction (Patient not taking: Reported on 01/14/2024) tamsulosin (FLOMAX) 0.4 mg capsule (Patient not taking: Reported on 01/14/2024)  No current facility-administered medications on file prior to visit.  Medical / Surgical History  Past Medical History: Diagnosis Date Allergic rhinitis BPH (benign prostatic hyperplasia) Coronary artery disease involving native coronary artery of native heart with unstable angina pectoris (CMS/HHS-HCC) 09/14/2016 GERD (gastroesophageal reflux disease) History of motion sickness HTN (hypertension) Hyperlipidemia Kidney stones OSA on CPAP 09/29/2016 Stage 3a chronic kidney disease (CMS/HHS-HCC) 07/17/2022   Past Surgical History: Procedure Laterality Date COLONOSCOPY 08/31/2007 Hyperplastic Polyps: CBF 08/2017; Recall Ltr mailed 08/05/2017 (dw) Cardiac Catheterization 09/09/2016 L Heart Cath & Coronary angiography- Dr. Juliann Pares CORONARY ARTERY BYPASS W/SINGLE ARTERY GRAFT N/A 10/02/2016 Procedure: CORONARY ARTERY BYPASS, USING ARTERIAL GRAFT(S); SINGLE ARTERIAL GRAFT, Left internal mammary artery harvest;  Surgeon: Lily Peer, MD; Location: [facility]; Service: Cardiothoracic; Laterality: N/A; CORONARY ARTERY BYPASS W/VEIN ONLY N/A 10/02/2016 Procedure: CORONARY ARTERY BYPASS, USING VENOUS GRAFT(S) AND ARTERIAL GRAFT(S);SINGLE VEIN GRAFT (LIST IN ADDITION TO PRIMARY PROCEDURE); Surgeon: Lily Peer, MD; Location: [facility]; Service: Cardiothoracic; Laterality: N/A; COLONOSCOPY 01/03/2018 Adenomatous Polyp: CBF 12/2022 EGD 01/03/2018 No sign of Barrett's: CBF 12/2022 EGD 01/10/2013, 08/31/2007, 11/22/2000 Barrett's Esophagus: CBF 12/2015; Recall Ltr mailed 11/13/2015 (dw) EYE SURGERY Right cataract HERNIA REPAIR 1980s umbilical kidney stone LITHOTRIPSY x 2   Physical Exam  Ht:167.6 cm (5\' 6" ) Wt:91.2 kg (201 lb) BMI: Body mass index is 32.44 kg/m.  General/Constitutional: No apparent distress: well-nourished and well developed. Eyes: Pupils equal, round with synchronous movement. Lymphatic: No palpable adenopathy. Respiratory: Patient has good chest rise and fall with inspiration and expiration. All lung fields are clear to auscultation bilaterally. There is no Rales, rhonchi or wheezes appreciated. Cardiovascular: Upon auscultation there is a regular rate and rhythm without any murmurs, rubs, gallops or heaves appreciated. There does not appear to be any swelling down the lower extremities. Posterior tibial pulses appreciated bilaterally, 2+. Integumentary: No impressive skin lesions present, except as noted in detailed exam. Neuro/Psych: Normal mood and affect, oriented to person, place and time. Musculoskeletal: see exam below  Right right knee exam Upon inspection of the patient's right knee there does not appear to be any skin changes, open abrasions, swelling or redness. There is a varus alignment. Upon palpation, the patient reports having pain along the medial aspect of their knee. Patient has approximately 12 degrees off of full extension  actively  with ROM, and able to flex back to 107 degrees with mild pain. Varus and valgus stress testing shows positive pseudolaxity to valgus stressing. The patella tracks well within the femoral groove from flexion into extension with mild crepitus appreciated. Anterior and posterior drawer testing negative. Patient is neurovascularly intact down their lower extremity to all dermatomes. Posterior tibial pulses appreciated 2+.  Imaging right Knee Imaging: None were ordered today. Images from his visit on 12/08/2023 were reviewed. There is noticeable narrowing of the medial cartilage space with complete bone-on-bone articulation. Osteophyte formation is noted. Subchondral changes are also appreciated. Overall alignment is relative varus. There are no fractures, lytic lesions or gross deformities appreciated on films.  Assesment and Plan Right knee DJD  I have recommended that Timothy Hatfield undergo right total knee replacement. Consents has been signed. The risks, benefits, prognosis and alternatives including but not limited to DVT, PE, infection, neurovascular injury, failure of the procedure and death were explained to the patient and he is willing to proceed with surgery as described to him by myself. Plan will be for post operative admission of at least 1 midnight for pain control and PT. He will be managed with DVT prophylaxis, antibiotics preoperatively for 24 hours and aggressive in patient rehab.  Pre, intra and post op interventions were discussed. Patient has good understanding  Medication Reconciliation was performed. Discussed cessation of NSAIDs, vitamins and supplements.  A total of 55 minutes was spent reviewing patient's charts, medical reconciliation, discussing/educating the patient about surgical interventions, and answering any questions provided by the patient.  JOSHUA Kendrick Fries, PA [facility] 01/14/2024  Electronically signed by Timothy Rover, PA  at 01/14/2024 5:50 PM EST

## 2024-01-17 ENCOUNTER — Observation Stay
Admission: RE | Admit: 2024-01-17 | Discharge: 2024-01-18 | Disposition: A | Discharge status: Home / Self Care | Payer: Medicare PPO | Attending: Orthopedic Surgery | Admitting: Orthopedic Surgery

## 2024-01-17 ENCOUNTER — Encounter: Payer: Self-pay | Admitting: Orthopedic Surgery

## 2024-01-17 ENCOUNTER — Ambulatory Visit: Payer: Medicare PPO | Admitting: Urgent Care

## 2024-01-17 ENCOUNTER — Encounter
Admission: RE | Disposition: A | Discharge status: Home / Self Care | Payer: Self-pay | Source: Home / Self Care | Attending: Orthopedic Surgery

## 2024-01-17 ENCOUNTER — Other Ambulatory Visit: Payer: Self-pay

## 2024-01-17 ENCOUNTER — Ambulatory Visit: Payer: Medicare PPO

## 2024-01-17 DIAGNOSIS — I129 Hypertensive chronic kidney disease with stage 1 through stage 4 chronic kidney disease, or unspecified chronic kidney disease: Secondary | ICD-10-CM | POA: Insufficient documentation

## 2024-01-17 DIAGNOSIS — Z471 Aftercare following joint replacement surgery: Secondary | ICD-10-CM | POA: Diagnosis not present

## 2024-01-17 DIAGNOSIS — N200 Calculus of kidney: Secondary | ICD-10-CM

## 2024-01-17 DIAGNOSIS — Z96651 Presence of right artificial knee joint: Secondary | ICD-10-CM

## 2024-01-17 DIAGNOSIS — Z951 Presence of aortocoronary bypass graft: Secondary | ICD-10-CM | POA: Diagnosis not present

## 2024-01-17 DIAGNOSIS — I2510 Atherosclerotic heart disease of native coronary artery without angina pectoris: Secondary | ICD-10-CM | POA: Diagnosis not present

## 2024-01-17 DIAGNOSIS — M1711 Unilateral primary osteoarthritis, right knee: Principal | ICD-10-CM | POA: Insufficient documentation

## 2024-01-17 DIAGNOSIS — N1831 Chronic kidney disease, stage 3a: Secondary | ICD-10-CM | POA: Insufficient documentation

## 2024-01-17 DIAGNOSIS — K219 Gastro-esophageal reflux disease without esophagitis: Secondary | ICD-10-CM

## 2024-01-17 DIAGNOSIS — Z79899 Other long term (current) drug therapy: Secondary | ICD-10-CM | POA: Diagnosis not present

## 2024-01-17 DIAGNOSIS — D631 Anemia in chronic kidney disease: Secondary | ICD-10-CM | POA: Diagnosis not present

## 2024-01-17 DIAGNOSIS — N401 Enlarged prostate with lower urinary tract symptoms: Secondary | ICD-10-CM

## 2024-01-17 DIAGNOSIS — I25110 Atherosclerotic heart disease of native coronary artery with unstable angina pectoris: Secondary | ICD-10-CM | POA: Insufficient documentation

## 2024-01-17 DIAGNOSIS — Z7982 Long term (current) use of aspirin: Secondary | ICD-10-CM | POA: Insufficient documentation

## 2024-01-17 DIAGNOSIS — K529 Noninfective gastroenteritis and colitis, unspecified: Secondary | ICD-10-CM

## 2024-01-17 HISTORY — PX: KNEE ARTHROPLASTY: SHX992

## 2024-01-17 HISTORY — DX: Insomnia, unspecified: G47.00

## 2024-01-17 HISTORY — DX: Other ill-defined heart diseases: I51.89

## 2024-01-17 HISTORY — DX: Long term (current) use of aspirin: Z79.82

## 2024-01-17 HISTORY — DX: Disorders of diaphragm: J98.6

## 2024-01-17 HISTORY — DX: Motion sickness, initial encounter: T75.3XXA

## 2024-01-17 HISTORY — DX: Cataract extraction status, right eye: Z98.41

## 2024-01-17 HISTORY — DX: Drug-induced myopathy: G72.0

## 2024-01-17 HISTORY — DX: Drug-induced myopathy: T46.6X5A

## 2024-01-17 HISTORY — DX: Anemia, unspecified: D64.9

## 2024-01-17 HISTORY — DX: Atherosclerosis of aorta: I70.0

## 2024-01-17 HISTORY — DX: Localized edema: R60.0

## 2024-01-17 SURGERY — ARTHROPLASTY, KNEE, TOTAL, USING IMAGELESS COMPUTER-ASSISTED NAVIGATION
Anesthesia: Spinal | Site: Knee | Laterality: Right

## 2024-01-17 MED ORDER — PROPOFOL 10 MG/ML IV BOLUS
INTRAVENOUS | Status: DC | PRN
  Administered 2024-01-17: 25 ug/kg/min via INTRAVENOUS
  Administered 2024-01-17: 20 mg via INTRAVENOUS

## 2024-01-17 MED ORDER — CHLORHEXIDINE GLUCONATE 4 % EX SOLN
60.0000 mL | Freq: Once | CUTANEOUS | Status: AC
  Administered 2024-01-17: 4 via TOPICAL

## 2024-01-17 MED ORDER — TORSEMIDE 20 MG PO TABS
20.0000 mg | ORAL_TABLET | Freq: Every day | ORAL | Status: DC
  Administered 2024-01-18: 20 mg via ORAL
  Filled 2024-01-17: qty 1

## 2024-01-17 MED ORDER — PROPOFOL 10 MG/ML IV BOLUS
INTRAVENOUS | Status: AC
  Filled 2024-01-17: qty 20

## 2024-01-17 MED ORDER — CHLORHEXIDINE GLUCONATE 0.12 % MT SOLN
OROMUCOSAL | Status: AC
  Filled 2024-01-17: qty 15

## 2024-01-17 MED ORDER — TRIAMCINOLONE ACETONIDE 55 MCG/ACT NA AERO: 2.0000 | INHALATION_SPRAY | Freq: Every day | NASAL | Status: DC | PRN

## 2024-01-17 MED ORDER — ENSURE PRE-SURGERY PO LIQD
296.0000 mL | Freq: Once | ORAL | Status: AC
  Administered 2024-01-17: 296 mL via ORAL
  Filled 2024-01-17: qty 296

## 2024-01-17 MED ORDER — HYDROMORPHONE HCL 1 MG/ML IJ SOLN: 0.5000 mg | INTRAMUSCULAR | Status: DC | PRN

## 2024-01-17 MED ORDER — GABAPENTIN 300 MG PO CAPS
ORAL_CAPSULE | ORAL | Status: AC
  Filled 2024-01-17: qty 1

## 2024-01-17 MED ORDER — CELECOXIB 200 MG PO CAPS
200.0000 mg | ORAL_CAPSULE | Freq: Two times a day (BID) | ORAL | Status: DC
  Administered 2024-01-17 – 2024-01-18 (×2): 200 mg via ORAL
  Filled 2024-01-17 (×2): qty 1

## 2024-01-17 MED ORDER — OXYCODONE HCL 5 MG PO TABS: 5.0000 mg | ORAL_TABLET | Freq: Once | ORAL | Status: DC | PRN

## 2024-01-17 MED ORDER — ACETAMINOPHEN 10 MG/ML IV SOLN
1000.0000 mg | Freq: Four times a day (QID) | INTRAVENOUS | Status: DC
  Administered 2024-01-17 – 2024-01-18 (×3): 1000 mg via INTRAVENOUS
  Filled 2024-01-17: qty 100

## 2024-01-17 MED ORDER — ONDANSETRON HCL 4 MG/2ML IJ SOLN
INTRAMUSCULAR | Status: DC | PRN
  Administered 2024-01-17: 4 mg via INTRAVENOUS

## 2024-01-17 MED ORDER — BISACODYL 10 MG RE SUPP: 10.0000 mg | Freq: Every day | RECTAL | Status: DC | PRN

## 2024-01-17 MED ORDER — KETAMINE HCL 50 MG/5ML IJ SOSY
PREFILLED_SYRINGE | INTRAMUSCULAR | Status: AC
  Filled 2024-01-17: qty 5

## 2024-01-17 MED ORDER — OXYCODONE HCL 5 MG PO TABS
5.0000 mg | ORAL_TABLET | ORAL | Status: DC | PRN
  Administered 2024-01-17 – 2024-01-18 (×2): 5 mg via ORAL
  Filled 2024-01-17 (×2): qty 1

## 2024-01-17 MED ORDER — TRANEXAMIC ACID-NACL 1000-0.7 MG/100ML-% IV SOLN
1000.0000 mg | Freq: Once | INTRAVENOUS | Status: AC
  Administered 2024-01-17: 1000 mg via INTRAVENOUS

## 2024-01-17 MED ORDER — ACETAMINOPHEN 325 MG PO TABS: 325.0000 mg | ORAL_TABLET | Freq: Four times a day (QID) | ORAL | Status: DC | PRN

## 2024-01-17 MED ORDER — CELECOXIB 200 MG PO CAPS
400.0000 mg | ORAL_CAPSULE | Freq: Once | ORAL | Status: AC
  Administered 2024-01-17: 400 mg via ORAL

## 2024-01-17 MED ORDER — METOCLOPRAMIDE HCL 10 MG PO TABS
10.0000 mg | ORAL_TABLET | Freq: Three times a day (TID) | ORAL | Status: DC
  Administered 2024-01-17 – 2024-01-18 (×3): 10 mg via ORAL
  Filled 2024-01-17 (×3): qty 1

## 2024-01-17 MED ORDER — FERROUS SULFATE 325 (65 FE) MG PO TABS
325.0000 mg | ORAL_TABLET | Freq: Every day | ORAL | Status: DC
  Administered 2024-01-18: 325 mg via ORAL
  Filled 2024-01-17: qty 1

## 2024-01-17 MED ORDER — SURGIRINSE WOUND IRRIGATION SYSTEM - OPTIME
TOPICAL | Status: DC | PRN
  Administered 2024-01-17: 450 mL via TOPICAL

## 2024-01-17 MED ORDER — OXYCODONE HCL 5 MG PO TABS: 10.0000 mg | ORAL_TABLET | ORAL | Status: DC | PRN

## 2024-01-17 MED ORDER — DEXAMETHASONE SODIUM PHOSPHATE 10 MG/ML IJ SOLN
8.0000 mg | Freq: Once | INTRAMUSCULAR | Status: AC
  Administered 2024-01-17: 8 mg via INTRAVENOUS

## 2024-01-17 MED ORDER — BUPIVACAINE HCL (PF) 0.5 % IJ SOLN
INTRAMUSCULAR | Status: DC | PRN
  Administered 2024-01-17: 3 mL via INTRATHECAL

## 2024-01-17 MED ORDER — PROPOFOL 1000 MG/100ML IV EMUL
INTRAVENOUS | Status: AC
  Filled 2024-01-17: qty 100

## 2024-01-17 MED ORDER — TRAMADOL HCL 50 MG PO TABS: 50.0000 mg | ORAL_TABLET | ORAL | Status: DC | PRN

## 2024-01-17 MED ORDER — FENTANYL CITRATE (PF) 100 MCG/2ML IJ SOLN
INTRAMUSCULAR | Status: AC
  Filled 2024-01-17: qty 2

## 2024-01-17 MED ORDER — SODIUM CHLORIDE 0.9 % IV SOLN: INTRAVENOUS | Status: DC

## 2024-01-17 MED ORDER — DIPHENHYDRAMINE HCL 12.5 MG/5ML PO ELIX: 12.5000 mg | ORAL_SOLUTION | ORAL | Status: DC | PRN

## 2024-01-17 MED ORDER — MAGNESIUM HYDROXIDE 400 MG/5ML PO SUSP
30.0000 mL | Freq: Every day | ORAL | Status: DC
  Filled 2024-01-17: qty 30

## 2024-01-17 MED ORDER — BENAZEPRIL HCL 10 MG PO TABS
10.0000 mg | ORAL_TABLET | Freq: Every day | ORAL | Status: DC
  Administered 2024-01-17 – 2024-01-18 (×2): 10 mg via ORAL
  Filled 2024-01-17 (×2): qty 1

## 2024-01-17 MED ORDER — TRANEXAMIC ACID-NACL 1000-0.7 MG/100ML-% IV SOLN
1000.0000 mg | INTRAVENOUS | Status: AC
  Administered 2024-01-17: 1000 mg via INTRAVENOUS

## 2024-01-17 MED ORDER — LATANOPROST 0.005 % OP SOLN
1.0000 [drp] | Freq: Every day | OPHTHALMIC | Status: DC
  Administered 2024-01-17: 1 [drp] via OPHTHALMIC
  Filled 2024-01-17: qty 2.5

## 2024-01-17 MED ORDER — ACETAMINOPHEN 10 MG/ML IV SOLN
INTRAVENOUS | Status: DC | PRN
  Administered 2024-01-17: 1000 mg via INTRAVENOUS

## 2024-01-17 MED ORDER — HYDROCHLOROTHIAZIDE 12.5 MG PO TABS
12.5000 mg | ORAL_TABLET | Freq: Every day | ORAL | Status: DC
  Administered 2024-01-17 – 2024-01-18 (×2): 12.5 mg via ORAL
  Filled 2024-01-17 (×2): qty 1

## 2024-01-17 MED ORDER — CEFAZOLIN SODIUM-DEXTROSE 2-4 GM/100ML-% IV SOLN
INTRAVENOUS | Status: AC
  Filled 2024-01-17: qty 100

## 2024-01-17 MED ORDER — MIDAZOLAM HCL 2 MG/2ML IJ SOLN
INTRAMUSCULAR | Status: AC
  Filled 2024-01-17: qty 2

## 2024-01-17 MED ORDER — ACETAMINOPHEN 10 MG/ML IV SOLN
INTRAVENOUS | Status: AC
  Filled 2024-01-17: qty 100

## 2024-01-17 MED ORDER — FENTANYL CITRATE (PF) 100 MCG/2ML IJ SOLN: 25.0000 ug | INTRAMUSCULAR | Status: DC | PRN

## 2024-01-17 MED ORDER — LACTATED RINGERS IV SOLN: INTRAVENOUS | Status: DC

## 2024-01-17 MED ORDER — DUTASTERIDE 0.5 MG PO CAPS
0.5000 mg | ORAL_CAPSULE | Freq: Every day | ORAL | Status: DC
  Administered 2024-01-18: 0.5 mg via ORAL
  Filled 2024-01-17: qty 1

## 2024-01-17 MED ORDER — ISOSORBIDE MONONITRATE ER 30 MG PO TB24
30.0000 mg | ORAL_TABLET | Freq: Every day | ORAL | Status: DC
  Administered 2024-01-18: 30 mg via ORAL
  Filled 2024-01-17: qty 1

## 2024-01-17 MED ORDER — KETAMINE HCL 50 MG/5ML IJ SOSY
PREFILLED_SYRINGE | INTRAMUSCULAR | Status: DC | PRN
  Administered 2024-01-17 (×2): 10 mg via INTRAVENOUS

## 2024-01-17 MED ORDER — DEXAMETHASONE SODIUM PHOSPHATE 10 MG/ML IJ SOLN
INTRAMUSCULAR | Status: AC
  Filled 2024-01-17: qty 1

## 2024-01-17 MED ORDER — PANTOPRAZOLE SODIUM 40 MG PO TBEC
40.0000 mg | DELAYED_RELEASE_TABLET | Freq: Two times a day (BID) | ORAL | Status: DC
  Administered 2024-01-17 – 2024-01-18 (×2): 40 mg via ORAL
  Filled 2024-01-17 (×2): qty 1

## 2024-01-17 MED ORDER — FENTANYL CITRATE (PF) 100 MCG/2ML IJ SOLN
INTRAMUSCULAR | Status: DC | PRN
  Administered 2024-01-17: 50 ug via INTRAVENOUS
  Administered 2024-01-17 (×2): 25 ug via INTRAVENOUS

## 2024-01-17 MED ORDER — MENTHOL 3 MG MT LOZG: 1.0000 | LOZENGE | OROMUCOSAL | Status: DC | PRN

## 2024-01-17 MED ORDER — ONDANSETRON HCL 4 MG/2ML IJ SOLN: 4.0000 mg | Freq: Four times a day (QID) | INTRAMUSCULAR | Status: DC | PRN

## 2024-01-17 MED ORDER — ONDANSETRON HCL 4 MG PO TABS: 4.0000 mg | ORAL_TABLET | Freq: Four times a day (QID) | ORAL | Status: DC | PRN

## 2024-01-17 MED ORDER — METOPROLOL SUCCINATE ER 25 MG PO TB24
50.0000 mg | ORAL_TABLET | Freq: Every day | ORAL | Status: DC
  Administered 2024-01-18: 50 mg via ORAL
  Filled 2024-01-17: qty 2

## 2024-01-17 MED ORDER — TAMSULOSIN HCL 0.4 MG PO CAPS
0.4000 mg | ORAL_CAPSULE | Freq: Every day | ORAL | Status: DC
  Administered 2024-01-18: 0.4 mg via ORAL
  Filled 2024-01-17: qty 1

## 2024-01-17 MED ORDER — CEFAZOLIN SODIUM-DEXTROSE 2-4 GM/100ML-% IV SOLN
2.0000 g | Freq: Four times a day (QID) | INTRAVENOUS | Status: AC
  Administered 2024-01-17 – 2024-01-18 (×2): 2 g via INTRAVENOUS
  Filled 2024-01-17 (×2): qty 100

## 2024-01-17 MED ORDER — SODIUM CHLORIDE 0.9 % IR SOLN
Status: DC | PRN
  Administered 2024-01-17: 3000 mL

## 2024-01-17 MED ORDER — TRANEXAMIC ACID-NACL 1000-0.7 MG/100ML-% IV SOLN
INTRAVENOUS | Status: AC
Start: 2024-01-17 — End: ?
  Filled 2024-01-17: qty 100

## 2024-01-17 MED ORDER — OXYCODONE HCL 5 MG/5ML PO SOLN: 5.0000 mg | Freq: Once | ORAL | Status: DC | PRN

## 2024-01-17 MED ORDER — BENAZEPRIL-HYDROCHLOROTHIAZIDE 10-12.5 MG PO TABS
1.0000 | ORAL_TABLET | Freq: Every day | ORAL | Status: DC
Start: 2024-01-17 — End: 2024-01-17

## 2024-01-17 MED ORDER — CHLORHEXIDINE GLUCONATE 0.12 % MT SOLN
15.0000 mL | Freq: Once | OROMUCOSAL | Status: AC
  Administered 2024-01-17: 15 mL via OROMUCOSAL

## 2024-01-17 MED ORDER — FLEET ENEMA RE ENEM: 1.0000 | ENEMA | Freq: Once | RECTAL | Status: DC | PRN

## 2024-01-17 MED ORDER — TRANEXAMIC ACID-NACL 1000-0.7 MG/100ML-% IV SOLN
INTRAVENOUS | Status: AC
  Filled 2024-01-17: qty 100

## 2024-01-17 MED ORDER — SODIUM CHLORIDE (PF) 0.9 % IJ SOLN
INTRAMUSCULAR | Status: DC | PRN
  Administered 2024-01-17: 120 mL via SURGICAL_CAVITY

## 2024-01-17 MED ORDER — BUPIVACAINE HCL (PF) 0.5 % IJ SOLN
INTRAMUSCULAR | Status: AC
  Filled 2024-01-17: qty 10

## 2024-01-17 MED ORDER — DUTASTERIDE-TAMSULOSIN HCL 0.5-0.4 MG PO CAPS: 1.0000 | ORAL_CAPSULE | Freq: Every day | ORAL | Status: DC

## 2024-01-17 MED ORDER — CELECOXIB 200 MG PO CAPS
ORAL_CAPSULE | ORAL | Status: AC
  Filled 2024-01-17: qty 2

## 2024-01-17 MED ORDER — TIZANIDINE HCL 4 MG PO TABS: 4.0000 mg | ORAL_TABLET | Freq: Every evening | ORAL | Status: DC | PRN

## 2024-01-17 MED ORDER — GABAPENTIN 300 MG PO CAPS
300.0000 mg | ORAL_CAPSULE | Freq: Once | ORAL | Status: AC
  Administered 2024-01-17: 300 mg via ORAL

## 2024-01-17 MED ORDER — ASPIRIN 81 MG PO CHEW
81.0000 mg | CHEWABLE_TABLET | Freq: Two times a day (BID) | ORAL | Status: DC
  Administered 2024-01-17 – 2024-01-18 (×2): 81 mg via ORAL
  Filled 2024-01-17 (×2): qty 1

## 2024-01-17 MED ORDER — MIDAZOLAM HCL 5 MG/5ML IJ SOLN
INTRAMUSCULAR | Status: DC | PRN
  Administered 2024-01-17: 2 mg via INTRAVENOUS

## 2024-01-17 MED ORDER — EPINEPHRINE 0.3 MG/0.3ML IJ SOAJ: 0.3000 mg | INTRAMUSCULAR | Status: DC | PRN

## 2024-01-17 MED ORDER — ROSUVASTATIN CALCIUM 5 MG PO TABS
5.0000 mg | ORAL_TABLET | Freq: Every day | ORAL | Status: DC
  Administered 2024-01-18: 5 mg via ORAL
  Filled 2024-01-17: qty 1

## 2024-01-17 MED ORDER — NITROGLYCERIN 0.4 MG SL SUBL: 0.4000 mg | SUBLINGUAL_TABLET | SUBLINGUAL | Status: DC | PRN

## 2024-01-17 MED ORDER — CEFAZOLIN SODIUM-DEXTROSE 2-4 GM/100ML-% IV SOLN
2.0000 g | INTRAVENOUS | Status: AC
Start: 2024-01-17 — End: 2024-01-17
  Administered 2024-01-17: 2 g via INTRAVENOUS

## 2024-01-17 MED ORDER — PHENOL 1.4 % MT LIQD: 1.0000 | OROMUCOSAL | Status: DC | PRN

## 2024-01-17 MED ORDER — ALUM & MAG HYDROXIDE-SIMETH 200-200-20 MG/5ML PO SUSP: 30.0000 mL | ORAL | Status: DC | PRN

## 2024-01-17 MED ORDER — SENNOSIDES-DOCUSATE SODIUM 8.6-50 MG PO TABS
1.0000 | ORAL_TABLET | Freq: Two times a day (BID) | ORAL | Status: DC
  Administered 2024-01-17: 1 via ORAL
  Filled 2024-01-17 (×2): qty 1

## 2024-01-17 MED ORDER — NITROFURANTOIN MONOHYD MACRO 100 MG PO CAPS
100.0000 mg | ORAL_CAPSULE | Freq: Two times a day (BID) | ORAL | Status: DC
  Administered 2024-01-17 – 2024-01-18 (×2): 100 mg via ORAL
  Filled 2024-01-17 (×2): qty 1

## 2024-01-17 MED ORDER — ORAL CARE MOUTH RINSE: 15.0000 mL | Freq: Once | OROMUCOSAL | Status: AC

## 2024-01-17 MED ORDER — NITROFURANTOIN MONOHYD MACRO 100 MG PO CAPS
100.0000 mg | ORAL_CAPSULE | Freq: Once | ORAL | Status: AC
Start: 2024-01-17 — End: 2024-01-17
  Administered 2024-01-17: 100 mg via ORAL
  Filled 2024-01-17: qty 1

## 2024-01-17 MED ORDER — ONDANSETRON HCL 4 MG/2ML IJ SOLN
INTRAMUSCULAR | Status: AC
  Filled 2024-01-17: qty 2

## 2024-01-17 SURGICAL SUPPLY — 68 items
ATTUNE MED DOME PAT 38 KNEE (Knees) IMPLANT
ATTUNE PS FEM RT SZ 6 CEM KNEE (Femur) IMPLANT
ATTUNE PSRP INSR SZ6 5 KNEE (Insert) IMPLANT
BASE TIBIA ATTUNE KNEE SYS SZ6 (Knees) IMPLANT
BATTERY INSTRU NAVIGATION (MISCELLANEOUS) ×4 IMPLANT
BIT DRILL QUICK REL 1/8 2PK SL (BIT) ×1 IMPLANT
BLADE CLIPPER SURG (BLADE) IMPLANT
BLADE SAW 70X12.5 (BLADE) ×1 IMPLANT
BLADE SAW 90X13X1.19 OSCILLAT (BLADE) ×1 IMPLANT
BLADE SAW 90X25X1.19 OSCILLAT (BLADE) ×1 IMPLANT
BONE CEMENT GENTAMICIN (Cement) ×2 IMPLANT
BRUSH SCRUB EZ PLAIN DRY (MISCELLANEOUS) ×1 IMPLANT
CEMENT BONE GENTAMICIN 40 (Cement) IMPLANT
COOLER POLAR GLACIER W/PUMP (MISCELLANEOUS) ×1 IMPLANT
CUFF TRNQT CYL 24X4X16.5-23 (TOURNIQUET CUFF) IMPLANT
CUFF TRNQT CYL 30X4X21-28X (TOURNIQUET CUFF) IMPLANT
DRAPE SHEET LG 3/4 BI-LAMINATE (DRAPES) ×1 IMPLANT
DRSG AQUACEL AG ADV 3.5X14 (GAUZE/BANDAGES/DRESSINGS) ×1 IMPLANT
DRSG MEPILEX SACRM 8.7X9.8 (GAUZE/BANDAGES/DRESSINGS) ×1 IMPLANT
DRSG TEGADERM 4X4.75 (GAUZE/BANDAGES/DRESSINGS) ×1 IMPLANT
DRSG XEROFORM 1X8 (GAUZE/BANDAGES/DRESSINGS) IMPLANT
DURAPREP 26ML APPLICATOR (WOUND CARE) ×2 IMPLANT
ELECT CAUTERY BLADE 6.4 (BLADE) ×1 IMPLANT
ELECT REM PT RETURN 9FT ADLT (ELECTROSURGICAL) ×1 IMPLANT
ELECTRODE REM PT RTRN 9FT ADLT (ELECTROSURGICAL) ×1 IMPLANT
EVACUATOR 1/8 PVC DRAIN (DRAIN) ×1 IMPLANT
EX-PIN ORTHOLOCK NAV 4X150 (PIN) ×2 IMPLANT
GAUZE XEROFORM 1X8 LF (GAUZE/BANDAGES/DRESSINGS) ×1 IMPLANT
GLOVE BIOGEL M STRL SZ7.5 (GLOVE) ×6 IMPLANT
GLOVE SURG UNDER POLY LF SZ8 (GLOVE) ×2 IMPLANT
GOWN STRL REUS W/ TWL LRG LVL3 (GOWN DISPOSABLE) ×1 IMPLANT
GOWN STRL REUS W/ TWL XL LVL3 (GOWN DISPOSABLE) ×1 IMPLANT
GOWN TOGA ZIPPER T7+ PEEL AWAY (MISCELLANEOUS) ×1 IMPLANT
HOLDER FOLEY CATH W/STRAP (MISCELLANEOUS) ×1 IMPLANT
HOOD PEEL AWAY T7 (MISCELLANEOUS) ×1 IMPLANT
IV NS IRRIG 3000ML ARTHROMATIC (IV SOLUTION) ×1 IMPLANT
KIT TURNOVER KIT A (KITS) ×1 IMPLANT
KNIFE SCULPS 14X20 (INSTRUMENTS) ×1 IMPLANT
MANIFOLD NEPTUNE II (INSTRUMENTS) ×2 IMPLANT
NDL SPNL 20GX3.5 QUINCKE YW (NEEDLE) ×2 IMPLANT
NEEDLE SPNL 20GX3.5 QUINCKE YW (NEEDLE) ×2 IMPLANT
PACK TOTAL KNEE (MISCELLANEOUS) ×1 IMPLANT
PAD ABD DERMACEA PRESS 5X9 (GAUZE/BANDAGES/DRESSINGS) ×2 IMPLANT
PAD ARMBOARD 7.5X6 YLW CONV (MISCELLANEOUS) ×3 IMPLANT
PAD WRAPON POLAR KNEE (MISCELLANEOUS) ×1 IMPLANT
PENCIL SMOKE EVACUATOR COATED (MISCELLANEOUS) ×1 IMPLANT
PIN DRILL FIX HALF THREAD (BIT) ×2 IMPLANT
PIN FIXATION 1/8DIA X 3INL (PIN) ×1 IMPLANT
PULSAVAC PLUS IRRIG FAN TIP (DISPOSABLE) ×1 IMPLANT
SOLUTION IRRIG SURGIPHOR (IV SOLUTION) ×1 IMPLANT
SPONGE DRAIN TRACH 4X4 STRL 2S (GAUZE/BANDAGES/DRESSINGS) ×1 IMPLANT
STAPLER SKIN PROX 35W (STAPLE) ×1 IMPLANT
STOCKINETTE STRL BIAS CUT 8X4 (MISCELLANEOUS) ×1 IMPLANT
STRAP TIBIA SHORT (MISCELLANEOUS) ×1 IMPLANT
SUCTION TUBE FRAZIER 10FR DISP (SUCTIONS) ×1 IMPLANT
SUCTION TUBE FRAZIER 12FR DISP (SUCTIONS) IMPLANT
SUT VIC AB 0 CT1 36 (SUTURE) ×1 IMPLANT
SUT VIC AB 1 CT1 36 (SUTURE) ×2 IMPLANT
SUT VIC AB 2-0 CT2 27 (SUTURE) ×1 IMPLANT
SYR 30ML LL (SYRINGE) ×2 IMPLANT
TIBIA ATTUNE KNEE SYS BASE SZ6 (Knees) ×1 IMPLANT
TIP FAN IRRIG PULSAVAC PLUS (DISPOSABLE) ×1 IMPLANT
TOWEL OR 17X26 4PK STRL BLUE (TOWEL DISPOSABLE) ×1 IMPLANT
TOWER CARTRIDGE SMART MIX (DISPOSABLE) ×1 IMPLANT
TRAP FLUID SMOKE EVACUATOR (MISCELLANEOUS) ×1 IMPLANT
TRAY FOLEY MTR SLVR 16FR STAT (SET/KITS/TRAYS/PACK) ×1 IMPLANT
WATER STERILE IRR 1000ML POUR (IV SOLUTION) ×1 IMPLANT
WRAPON POLAR PAD KNEE (MISCELLANEOUS) ×1 IMPLANT

## 2024-01-17 NOTE — Progress Notes (Signed)
 Patient is not able to walk the distance required to go the bathroom, or he/she is unable to safely negotiate stairs required to access the bathroom.  A 3in1 BSC will alleviate this problem   Amenda Duclos P. Angie Fava M.D.

## 2024-01-17 NOTE — Plan of Care (Signed)
   Problem: Nutrition: Goal: Adequate nutrition will be maintained Outcome: Progressing   Problem: Pain Managment: Goal: General experience of comfort will improve and/or be controlled Outcome: Progressing   Problem: Safety: Goal: Ability to remain free from injury will improve Outcome: Progressing

## 2024-01-17 NOTE — Progress Notes (Signed)
 Subjective: 1 Day Post-Op Procedure(s) (LRB): COMPUTER ASSISTED TOTAL KNEE ARTHROPLASTY (Right) Patient reports pain as mild.   Patient seen in rounds with Dr. Ernest Pine. Patient is well, and has had no acute complaints or problems. Denies any CP, SOB, N/V, fevers or chills We will start therapy today.  Plan is to go Home after hospital stay.  Objective: Vital signs in last 24 hours: Temp:  [96.8 F (36 C)-99 F (37.2 C)] 98.1 F (36.7 C) (02/25 0735) Pulse Rate:  [62-74] 74 (02/25 0735) Resp:  [11-18] 18 (02/25 0735) BP: (125-157)/(73-85) 157/73 (02/25 0735) SpO2:  [95 %-100 %] 95 % (02/25 0735)  Intake/Output from previous day:  Intake/Output Summary (Last 24 hours) at 01/18/2024 0837 Last data filed at 01/18/2024 0456 Gross per 24 hour  Intake 1868.08 ml  Output 1770 ml  Net 98.08 ml    Intake/Output this shift: No intake/output data recorded.  Labs: No results for input(s): "HGB" in the last 72 hours. No results for input(s): "WBC", "RBC", "HCT", "PLT" in the last 72 hours. No results for input(s): "NA", "K", "CL", "CO2", "BUN", "CREATININE", "GLUCOSE", "CALCIUM" in the last 72 hours. No results for input(s): "LABPT", "INR" in the last 72 hours.  EXAM General - Patient is Alert, Appropriate, and Oriented Extremity - Neurologically intact Neurovascular intact Sensation intact distally Intact pulses distally Dorsiflexion/Plantar flexion intact No cellulitis present Compartment soft Dressing - dressing C/D/I and no drainage Motor Function - intact, moving foot and toes well on exam. Able to plantar and dorsi flex with good strength and ROM. Posterior tibial pulses appreciated JP Drain pulled without difficulty. Intact  Past Medical History:  Diagnosis Date   Allergy    Anemia    Aortic atherosclerosis (HCC)    Barrett's esophagus    Bilateral lower extremity edema    BPH (benign prostatic hyperplasia)    Coronary artery disease involving native coronary artery  of native heart 09/09/2016   a.) LHC 09/09/2016: 100% OM1, 70% OM2, 90% mLAD, 90% RI, 75% pRCA, 70% mRCA, 85% dRCA --> refer to CVTS; b.) s/p 4v CABG 10/02/2016   Degenerative arthritis of right knee 12/2023   Diastolic dysfunction    a.) TTE 07/17/2022:   Elevated RIGHT hemidiaphragm    Essential hypertension    GERD (gastroesophageal reflux disease)    History of kidney stones    History of right cataract extraction    Hyperlipidemia    Insomnia    a.) takes melatonin PRN   Long-term use of aspirin therapy    Motion sickness    Obstructive sleep apnea on CPAP    Pneumonia    Pulmonary hypertension (HCC) 07/17/2022   a.) TTE 07/17/2022: PASP 40 mmHg   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   Weight as of 01/10/24: 90.7 kg. Advance diet Up with therapy  Patient will continue to work with physical therapy to pass postoperative PT protocols, ROM and strengthening  Discussed with the patient continuing to utilize Polar Care  Patient will use bone foam in 20-30 minute intervals  Patient will wear TED hose bilaterally to help prevent  DVT and clot formation  Continue abx for UTI  Discussed the Aquacel bandage.  This bandage will stay in place 7 days postoperatively.  Can be replaced with honeycomb bandages that will be sent home with the patient  Discussed sending the patient home with tramadol and oxycodone for as needed pain management.  Patient will also be sent home with Celebrex to help with swelling and inflammation.  Patient will take an 81 mg aspirin twice daily for DVT prophylaxis  JP drain  removed without difficulty, intact  Weight-Bearing as tolerated to right leg  Patient will follow-up with [facility] in 2 weeks for staple removal and reevaluation  Rayburn Go, PA-C [facility] 01/18/2024, 8:37 AM

## 2024-01-17 NOTE — Transfer of Care (Signed)
 Immediate Anesthesia Transfer of Care Note  Patient: Timothy Hatfield  Procedure(s) Performed: COMPUTER ASSISTED TOTAL KNEE ARTHROPLASTY (Right: Knee)  Patient Location: PACU  Anesthesia Type:General and Spinal  Level of Consciousness: awake, alert , oriented, and patient cooperative  Airway & Oxygen Therapy: Patient Spontanous Breathing  Post-op Assessment: Report given to RN, Post -op Vital signs reviewed and stable, and Patient moving all extremities  Post vital signs: Reviewed and stable  Last Vitals:  Vitals Value Taken Time  BP 134/74 01/17/24 1534  Temp 36 C 01/17/24 1534  Pulse 70 01/17/24 1539  Resp 9 01/17/24 1539  SpO2 95 % 01/17/24 1539  Vitals shown include unfiled device data.  Last Pain:  Vitals:   01/17/24 1534  PainSc: 0-No pain         Complications: No notable events documented.

## 2024-01-17 NOTE — Discharge Summary (Signed)
 Physician Discharge Summary  Subjective: 1 Day Post-Op Procedure(s) (LRB): COMPUTER ASSISTED TOTAL KNEE ARTHROPLASTY (Right) Patient reports pain as mild.   Patient seen in rounds with Dr. Ernest Pine. Patient is well, and has had no acute complaints or problems. Denies any CP, SOB, N/V, fevers or chills We will start therapy today.  Patient is ready to go home  Physician Discharge Summary  Patient ID: Timothy Hatfield MRN: [id number] DOB/AGE: 22-Feb-1946 78 y.o.  Admit date: 01/17/2024 Discharge date: 01/18/2024  Admission Diagnoses:  Discharge Diagnoses:  Principal Problem:   History of total knee arthroplasty, right   Discharged Condition: good  Hospital Course: Patient presented to the hospital on 01/17/2024 for an elective right total knee arthroplasty performed by Dr. Ernest Pine. Patient was given 1g of TXA and 2g of Ancef prior to the procedure. he  tolerated the procedure well without any complications. See procedural note below for details. Postoperatively, the patient did very well. he  was able to pass PT protocols on post-op day one without any issues. JP drain was removed without any difficulty and was intact. he  was able to void his bladder without any difficulty. Physical exam was unremarkable. he  denies any SOB, CP, N/V, fevers or chills. Vital signs are stable. Patient is stable to discharge home.  PROCEDURE:  Right total knee arthroplasty using computer-assisted navigation   SURGEON:  Jena Gauss. M.D.   ASSISTANT:  Gean Birchwood, PA-C (present and scrubbed throughout the case, critical for assistance with exposure, retraction, instrumentation, and closure)   ANESTHESIA: spinal   ESTIMATED BLOOD LOSS: 50 mL   FLUIDS REPLACED: 1200 mL of crystalloid   TOURNIQUET TIME: 90 minutes   DRAINS: 2 medium Hemovac drains   SOFT TISSUE RELEASES: Anterior cruciate ligament, posterior cruciate ligament, deep medial collateral ligament, patellofemoral ligament    IMPLANTS UTILIZED: DePuy Attune size 6 posterior stabilized femoral component (cemented), size 6 rotating platform tibial component (cemented), 38 mm medialized dome patella (cemented), and a 5 mm stabilized rotating platform polyethylene insert.    Treatments: none  Discharge Exam: Blood pressure (!) 157/73, pulse 74, temperature 98.1 F (36.7 C), temperature source Oral, resp. rate 18, SpO2 95%.   Disposition: Home   Allergies as of 01/18/2024       Reactions   Bee Venom Anaphylaxis   Ciprofloxacin Itching        Medication List     TAKE these medications    acetaminophen 650 MG CR tablet Commonly known as: TYLENOL Take 1,950 mg by mouth daily as needed.   aspirin EC 81 MG tablet Take 1 tablet (81 mg total) by mouth in the morning and at bedtime. What changed: when to take this   benazepril-hydrochlorthiazide 10-12.5 MG tablet Commonly known as: LOTENSIN HCT Take 1 tablet by mouth daily.   celecoxib 200 MG capsule Commonly known as: CELEBREX Take 1 capsule (200 mg total) by mouth 2 (two) times daily.   Dutasteride-Tamsulosin HCl 0.5-0.4 MG Caps Take 1 capsule by mouth daily.   EPINEPHrine 0.3 mg/0.3 mL Soaj injection Commonly known as: EPI-PEN Inject 0.3 mg into the muscle as needed for anaphylaxis.   Iron (Ferrous Sulfate) 325 (65 Fe) MG Tabs Take 325 mg by mouth daily.   isosorbide mononitrate 30 MG 24 hr tablet Commonly known as: IMDUR Take 30 mg by mouth daily.   latanoprost 0.005 % ophthalmic solution Commonly known as: XALATAN Place 1 drop into both eyes at bedtime.   Melatonin 10 MG Tabs Take 10  mg by mouth at bedtime.   metoprolol succinate 50 MG 24 hr tablet Commonly known as: TOPROL-XL Take 1 tablet (50 mg total) by mouth daily. TAKE WITH OR IMMEDIATELY FOLLOWING A MEAL.   nitrofurantoin (macrocrystal-monohydrate) 100 MG capsule Commonly known as: MACROBID Take 1 capsule (100 mg total) by mouth every 12 (twelve) hours for 4 days.    nitroGLYCERIN 0.4 MG SL tablet Commonly known as: NITROSTAT Place 1 tablet (0.4 mg total) under the tongue every 5 (five) minutes as needed for chest pain.   NON FORMULARY Pt uses a cpap nightly   OSTEO BI-FLEX TRIPLE STRENGTH PO Take 2 tablets by mouth daily.   oxyCODONE 5 MG immediate release tablet Commonly known as: Oxy IR/ROXICODONE Take 1 tablet (5 mg total) by mouth every 4 (four) hours as needed for moderate pain (pain score 4-6) (pain score 4-6).   pantoprazole 40 MG tablet Commonly known as: PROTONIX TAKE 1 TABLET BY MOUTH ONCE DAILY   rosuvastatin 5 MG tablet Commonly known as: CRESTOR Take 5 mg by mouth daily.   tiZANidine 4 MG tablet Commonly known as: ZANAFLEX Take 4 mg by mouth at bedtime as needed for muscle spasms.   torsemide 20 MG tablet Commonly known as: DEMADEX Take 20 mg by mouth daily.   traMADol 50 MG tablet Commonly known as: ULTRAM Take 1-2 tablets (50-100 mg total) by mouth every 4 (four) hours as needed for moderate pain (pain score 4-6).   triamcinolone 55 MCG/ACT Aero nasal inhaler Commonly known as: NASACORT Place 2 sprays into the nose daily as needed (allergies).               Durable Medical Equipment  (From admission, onward)           Start     Ordered   01/17/24 1642  DME Walker rolling  Once       Question:  Patient needs a walker to treat with the following condition  Answer:  Total knee replacement status   01/17/24 1641   01/17/24 1642  DME Bedside commode  Once       Comments: Patient is not able to walk the distance required to go the bathroom, or he/she is unable to safely negotiate stairs required to access the bathroom.  A 3in1 BSC will alleviate this problem  Question:  Patient needs a bedside commode to treat with the following condition  Answer:  Total knee replacement status   01/17/24 1641            Follow-up Information     Rayburn Go, PA-C Follow up on 02/01/2024.   Specialty:  Orthopedic Surgery Why: at 8:45am Contact information: [address] [place] [address] [phone]         Donato Heinz, MD Follow up on 02/29/2024.   Specialty: Orthopedic Surgery Why: at 2:45pm Contact information: [address] [facility] [place] [address] [phone]                 Signed: Gean Birchwood 01/18/2024, 8:38 AM   Objective: Vital signs in last 24 hours: Temp:  [96.8 F (36 C)-99 F (37.2 C)] 98.1 F (36.7 C) (02/25 0735) Pulse Rate:  [62-74] 74 (02/25 0735) Resp:  [11-18] 18 (02/25 0735) BP: (125-157)/(73-85) 157/73 (02/25 0735) SpO2:  [95 %-100 %] 95 % (02/25 0735)  Intake/Output from previous day:  Intake/Output Summary (Last 24 hours) at 01/18/2024 0838 Last data filed at 01/18/2024 0456 Gross per 24 hour  Intake 1868.08 ml  Output 1770 ml  Net 98.08 ml    Intake/Output this shift: No intake/output data recorded.  Labs: No results for input(s): "HGB" in the last 72 hours. No results for input(s): "WBC", "RBC", "HCT", "PLT" in the last 72 hours. No results for input(s): "NA", "K", "CL", "CO2", "BUN", "CREATININE", "GLUCOSE", "CALCIUM" in the last 72 hours. No results for input(s): "LABPT", "INR" in the last 72 hours.  EXAM: General - Patient is Alert, Appropriate, and Oriented Extremity - Neurologically intact Neurovascular intact Sensation intact distally Intact pulses distally Dorsiflexion/Plantar flexion intact No cellulitis present Compartment soft Incision - clean, dry, intact no drainage Motor Function - intact, moving foot and toes well on exam. Able to plantar and dorsi flex with good strength and ROM. Posterior tibial pulses appreciated JP Drain pulled without difficulty. Intact  Assessment/Plan: 1 Day Post-Op Procedure(s) (LRB): COMPUTER ASSISTED TOTAL KNEE ARTHROPLASTY (Right) Procedure(s) (LRB): COMPUTER ASSISTED TOTAL KNEE ARTHROPLASTY (Right) Past Medical History:   Diagnosis Date   Allergy    Anemia    Aortic atherosclerosis (HCC)    Barrett's esophagus    Bilateral lower extremity edema    BPH (benign prostatic hyperplasia)    Coronary artery disease involving native coronary artery of native heart 09/09/2016   a.) LHC 09/09/2016: 100% OM1, 70% OM2, 90% mLAD, 90% RI, 75% pRCA, 70% mRCA, 85% dRCA --> refer to CVTS; b.) s/p 4v CABG 10/02/2016   Degenerative arthritis of right knee 12/2023   Diastolic dysfunction    a.) TTE 07/17/2022:   Elevated RIGHT hemidiaphragm    Essential hypertension    GERD (gastroesophageal reflux disease)    History of kidney stones    History of right cataract extraction    Hyperlipidemia    Insomnia    a.) takes melatonin PRN   Long-term use of aspirin therapy    Motion sickness    Obstructive sleep apnea on CPAP    Pneumonia    Pulmonary hypertension (HCC) 07/17/2022   a.) TTE 07/17/2022: PASP 40 mmHg   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   Weight as of 01/10/24: 90.7 kg.  Patient will continue to work with physical therapy on gait, ROM and strengthening   Discussed with the patient continuing to utilize Polar Care   Patient will use bone foam in 20-30 minute intervals   Patient will wear TED hose bilaterally to help prevent DVT and clot formation   Continue abx for UTI   Discussed the Aquacel bandage.  This bandage will stay in place 7 days postoperatively.  Can be replaced with honeycomb bandages that will be sent home with the patient   Discussed sending the patient home with tramadol and oxycodone for as needed pain management.  Patient will also be sent home with Celebrex to help with swelling and inflammation.  Patient  will take an 81 mg aspirin twice daily for DVT prophylaxis   JP drain removed without difficulty, intact   Weight-Bearing as tolerated to right leg   Patient will follow-up with [facility] orthopedics in 2 weeks for staple removal and reevaluation  Diet - Regular diet Follow up - in 2 weeks Activity -  WBAT Disposition - Home Condition Upon Discharge - Good DVT Prophylaxis - Aspirin and TED hose  Danise Edge, PA-C Orthopaedic Surgery 01/18/2024, 8:38 AM

## 2024-01-17 NOTE — Interval H&P Note (Signed)
 History and Physical Interval Note:  01/17/2024 11:27 AM  Timothy Hatfield  has presented today for surgery, with the diagnosis of PRIMARY OSTEOARTHRITIS OF RIGHT KNEE..  The various methods of treatment have been discussed with the patient and family. After consideration of risks, benefits and other options for treatment, the patient has consented to  Procedure(s): COMPUTER ASSISTED TOTAL KNEE ARTHROPLASTY (Right) as a surgical intervention.  The patient's history has been reviewed, patient examined, no change in status, stable for surgery.  I have reviewed the patient's chart and labs.  Questions were answered to the patient's satisfaction.     Kris No P [name]

## 2024-01-17 NOTE — Op Note (Signed)
 OPERATIVE NOTE  DATE OF SURGERY:  01/17/2024  PATIENT NAME:  Timothy Hatfield   DOB: [birth year]  MRN: [id number]  PRE-OPERATIVE DIAGNOSIS: Degenerative arthrosis of the right knee, primary  POST-OPERATIVE DIAGNOSIS:  Same  PROCEDURE:  Right total knee arthroplasty using computer-assisted navigation  SURGEON:  Jena Gauss. M.D.  ASSISTANT:  Gean Birchwood, PA-C (present and scrubbed throughout the case, critical for assistance with exposure, retraction, instrumentation, and closure)  ANESTHESIA: spinal  ESTIMATED BLOOD LOSS: 50 mL  FLUIDS REPLACED: 1200 mL of crystalloid  TOURNIQUET TIME: 90 minutes  DRAINS: 2 medium Hemovac drains  SOFT TISSUE RELEASES: Anterior cruciate ligament, posterior cruciate ligament, deep medial collateral ligament, patellofemoral ligament  IMPLANTS UTILIZED: DePuy Attune size 6 posterior stabilized femoral component (cemented), size 6 rotating platform tibial component (cemented), 38 mm medialized dome patella (cemented), and a 5 mm stabilized rotating platform polyethylene insert.  INDICATIONS FOR SURGERY: Timothy Hatfield is a 78 y.o. year old male with a long history of progressive knee pain. X-rays demonstrated severe degenerative changes in tricompartmental fashion. The patient had not seen any significant improvement despite conservative nonsurgical intervention. After discussion of the risks and benefits of surgical intervention, the patient expressed understanding of the risks benefits and agree with plans for total knee arthroplasty.   The risks, benefits, and alternatives were discussed at length including but not limited to the risks of infection, bleeding, nerve injury, stiffness, blood clots, the need for revision surgery, cardiopulmonary complications, among others, and they were willing to proceed.  PROCEDURE IN DETAIL: The patient was brought into the operating room and, after adequate spinal anesthesia was achieved, a tourniquet  was placed on the patient's upper thigh. The patient's knee and leg were cleaned and prepped with alcohol and DuraPrep and draped in the usual sterile fashion. A "timeout" was performed as per usual protocol. The lower extremity was exsanguinated using an Esmarch, and the tourniquet was inflated to 300 mmHg. An anterior longitudinal incision was made followed by a standard mid vastus approach. The deep fibers of the medial collateral ligament were elevated in a subperiosteal fashion off of the medial flare of the tibia so as to maintain a continuous soft tissue sleeve. The patella was subluxed laterally and the patellofemoral ligament was incised. Inspection of the knee demonstrated severe degenerative changes with full-thickness loss of articular cartilage. Osteophytes were debrided using a rongeur. Anterior and posterior cruciate ligaments were excised. Two 4.0 mm Schanz pins were inserted in the femur and into the tibia for attachment of the array of trackers used for computer-assisted navigation. Hip center was identified using a circumduction technique. Distal landmarks were mapped using the computer. The distal femur and proximal tibia were mapped using the computer. The distal femoral cutting guide was positioned using computer-assisted navigation so as to achieve a 5 distal valgus cut. The femur was sized and it was felt that a size 6 femoral component was appropriate. A size 6 femoral cutting guide was positioned and the anterior cut was performed and verified using the computer. This was followed by completion of the posterior and chamfer cuts. Femoral cutting guide for the central box was then positioned in the center box cut was performed.  Attention was then directed to the proximal tibia. Medial and lateral menisci were excised. The extramedullary tibial cutting guide was positioned using computer-assisted navigation so as to achieve a 0 varus-valgus alignment and 3 posterior slope. The cut was  performed and verified using the computer. The proximal tibia  was sized and it was felt that a size 6 tibial tray was appropriate. Tibial and femoral trials were inserted followed by insertion of a 5 mm polyethylene insert. This allowed for excellent mediolateral soft tissue balancing both in flexion and in full extension. Finally, the patella was cut and prepared so as to accommodate a 38 mm medialized dome patella. A patella trial was placed and the knee was placed through a range of motion with excellent patellar tracking appreciated. The femoral trial was removed after debridement of posterior osteophytes. The central post-hole for the tibial component was reamed followed by insertion of a keel punch. Tibial trials were then removed. Cut surfaces of bone were irrigated with copious amounts of normal saline using pulsatile lavage and then suctioned dry. Polymethylmethacrylate cement with gentamicin was prepared in the usual fashion using a vacuum mixer. Cement was applied to the cut surface of the proximal tibia as well as along the undersurface of a size 6 rotating platform tibial component. Tibial component was positioned and impacted into place. Excess cement was removed using Personal assistant. Cement was then applied to the cut surfaces of the femur as well as along the posterior flanges of the size 6 femoral component. The femoral component was positioned and impacted into place. Excess cement was removed using Personal assistant. A 5 mm polyethylene trial was inserted and the knee was brought into full extension with steady axial compression applied. Finally, cement was applied to the backside of a 38 mm medialized dome patella and the patellar component was positioned and patellar clamp applied. Excess cement was removed using Personal assistant. After adequate curing of the cement, the tourniquet was deflated after a total tourniquet time of 90 minutes. Hemostasis was achieved using electrocautery. The knee was  irrigated with copious amounts of normal saline using pulsatile lavage followed by 450 ml of Surgiphor and then suctioned dry. 20 mL of 1.3% Exparel and 60 mL of 0.25% Marcaine in 40 mL of normal saline was injected along the posterior capsule, medial and lateral gutters, and along the arthrotomy site. A 5 mm stabilized rotating platform polyethylene insert was inserted and the knee was placed through a range of motion with excellent mediolateral soft tissue balancing appreciated and excellent patellar tracking noted. 2 medium drains were placed in the wound bed and brought out through separate stab incisions. The medial parapatellar portion of the incision was reapproximated using interrupted sutures of #1 Vicryl. Subcutaneous tissue was approximated in layers using first #0 Vicryl followed #2-0 Vicryl. The skin was approximated with skin staples. A sterile dressing was applied.  The patient tolerated the procedure well and was transported to the recovery room in stable condition.    Odelle Kosier P. Angie Fava., M.D.

## 2024-01-17 NOTE — Anesthesia Preprocedure Evaluation (Signed)
 Anesthesia Evaluation  Patient identified by MRN, date of birth, ID band Patient awake    Reviewed: Allergy & Precautions, NPO status , Patient's Chart, lab work & pertinent test results  History of Anesthesia Complications Negative for: history of anesthetic complications  Airway Mallampati: III  TM Distance: <3 FB Neck ROM: full    Dental  (+) Chipped, Poor Dentition   Pulmonary neg shortness of breath, sleep apnea and Continuous Positive Airway Pressure Ventilation    Pulmonary exam normal        Cardiovascular hypertension, (-) angina + CAD and + CABG  Normal cardiovascular exam     Neuro/Psych  PSYCHIATRIC DISORDERS       Neuromuscular disease    GI/Hepatic Neg liver ROS,GERD  Controlled,,  Endo/Other  negative endocrine ROS    Renal/GU Renal disease     Musculoskeletal   Abdominal   Peds  Hematology negative hematology ROS (+)   Anesthesia Other Findings Patient has cardiac clearance for this procedure.   Past Medical History: No date: Allergy No date: Anemia No date: Aortic atherosclerosis (HCC) No date: Barrett's esophagus No date: Bilateral lower extremity edema No date: BPH (benign prostatic hyperplasia) 09/09/2016: Coronary artery disease involving native coronary artery  of native heart     Comment:  a.) LHC 09/09/2016: 100% OM1, 70% OM2, 90% mLAD, 90% RI,              75% pRCA, 70% mRCA, 85% dRCA --> refer to CVTS; b.) s/p               4v CABG 10/02/2016 12/2023: Degenerative arthritis of right knee No date: Diastolic dysfunction     Comment:  a.) TTE 07/17/2022: No date: Elevated RIGHT hemidiaphragm No date: Essential hypertension No date: GERD (gastroesophageal reflux disease) No date: History of kidney stones No date: History of right cataract extraction No date: Hyperlipidemia No date: Insomnia     Comment:  a.) takes melatonin PRN No date: Long-term use of aspirin therapy No date:  Motion sickness No date: Obstructive sleep apnea on CPAP No date: Pneumonia 07/17/2022: Pulmonary hypertension (HCC)     Comment:  a.) TTE 07/17/2022: PASP 40 mmHg 10/02/2016: S/P CABG x 4     Comment:  a.) LIMA-LAD, SVG-PDA, SVG-OM1, SVG-OM3 No date: Stage 3a chronic kidney disease (CKD) (HCC) No date: Statin myopathy  Past Surgical History: 09/09/2016: CARDIAC CATHETERIZATION; Left     Comment:  Procedure: Left Heart Cath and Coronary Angiography;                Surgeon: Alwyn Pea, MD;  Location: [facility] INVASIVE               CV LAB;  Service: Cardiovascular;  Laterality: Left; No date: CATARACT EXTRACTION W/ INTRAOCULAR LENS IMPLANT; Right 2013: COLONOSCOPY     Comment:  normal/ Dr Mechele Collin- cleared for 10 yrs 08/31/2007: COLONOSCOPY 01/03/2018: COLONOSCOPY WITH PROPOFOL; N/A     Comment:  Procedure: COLONOSCOPY WITH PROPOFOL;  Surgeon: Scot Jun, MD;  Location: [facility] ENDOSCOPY;  Service:               Endoscopy;  Laterality: N/A; 10/02/2016: CORONARY ARTERY BYPASS GRAFT; N/A     Comment:  Procedure: CORONARY ARTERY BYPASS GRAFTING (4 vessels);               Location: [facility]; Surgeon: Rochele Pages, MD 07/11/2020: Bluford Kaufmann W/ RETROGRADES; Bilateral  Comment:  Procedure: CYSTOSCOPY WITH RETROGRADE PYELOGRAM;                Surgeon: Sondra Come, MD;  Location: [facility] ORS;                Service: Urology;  Laterality: Bilateral; 07/26/2020: CYSTOSCOPY W/ RETROGRADES     Comment:  Procedure: CYSTOSCOPY WITH RETROGRADE PYELOGRAM;                Surgeon: Sondra Come, MD;  Location: [facility] ORS;                Service: Urology;; 07/26/2020: CYSTOSCOPY W/ URETERAL STENT REMOVAL; Right     Comment:  Procedure: CYSTOSCOPY WITH STENT REMOVAL;  Surgeon:               Sondra Come, MD;  Location: [facility] ORS;  Service:               Urology;  Laterality: Right; 07/11/2020: CYSTOSCOPY/URETEROSCOPY/HOLMIUM LASER/STENT PLACEMENT;  Bilateral     Comment:   Procedure: CYSTOSCOPY/URETEROSCOPY/HOLMIUM LASER/STENT               PLACEMENT;  Surgeon: Sondra Come, MD;  Location:               [facility] ORS;  Service: Urology;  Laterality: Bilateral; 07/26/2020: CYSTOSCOPY/URETEROSCOPY/HOLMIUM LASER/STENT PLACEMENT;  Left     Comment:  Procedure: CYSTOSCOPY/URETEROSCOPY/HOLMIUM LASER/STENT               EXCHANGE;  Surgeon: Sondra Come, MD;  Location: [facility]              ORS;  Service: Urology;  Laterality: Left; No date: ESOPHAGOGASTRODUODENOSCOPY     Comment:  2001, 2008, 2014 11/12/2023: ESOPHAGOGASTRODUODENOSCOPY 01/03/2018: ESOPHAGOGASTRODUODENOSCOPY (EGD) WITH PROPOFOL; N/A     Comment:  Procedure: ESOPHAGOGASTRODUODENOSCOPY (EGD) WITH               PROPOFOL;  Surgeon: Scot Jun, MD;  Location:               [facility] ENDOSCOPY;  Service: Endoscopy;  Laterality: N/A; 07/11/2020: EXTRACORPOREAL SHOCK WAVE LITHOTRIPSY; Left     Comment:  Procedure: EXTRACORPOREAL SHOCK WAVE LITHOTRIPSY (ESWL);              Surgeon: Sondra Come, MD;  Location: [facility] ORS;                Service: Urology;  Laterality: Left; 03/15/2018: IR NEPHROSTOMY PLACEMENT RIGHT No date: LITHOTRIPSY No date: LITHOTRIPSY 03/15/2018: NEPHROLITHOTOMY; Right     Comment:  Procedure: NEPHROLITHOTOMY PERCUTANEOUS;  Surgeon:               Riki Altes, MD;  Location: [facility] ORS;  Service:               Urology;  Laterality: Right; 07/11/2020: STONE EXTRACTION WITH BASKET     Comment:  Procedure: BLADDER STONE EXTRACTION;  Surgeon: Sondra Come, MD;  Location: [facility] ORS;  Service: Urology;; No date: UMBILICAL HERNIA REPAIR 2013: UPPER GI ENDOSCOPY     Reproductive/Obstetrics negative OB ROS                             Anesthesia Physical Anesthesia Plan  ASA: 3  Anesthesia Plan: Spinal   Post-op Pain Management:    Induction:  PONV Risk Score and Plan:   Airway Management Planned: Natural Airway and Nasal  Cannula  Additional Equipment:   Intra-op Plan:   Post-operative Plan:   Informed Consent: I have reviewed the patients History and Physical, chart, labs and discussed the procedure including the risks, benefits and alternatives for the proposed anesthesia with the patient or authorized representative who has indicated his/her understanding and acceptance.     Dental Advisory Given  Plan Discussed with: Anesthesiologist, CRNA and Surgeon  Anesthesia Plan Comments: (Patient reports no bleeding problems and no anticoagulant use.  Plan for spinal with backup GA  Patient consented for risks of anesthesia including but not limited to:  - adverse reactions to medications - damage to eyes, teeth, lips or other oral mucosa - nerve damage due to positioning  - risk of bleeding, infection and or nerve damage from spinal that could lead to paralysis - risk of headache or failed spinal - damage to teeth, lips or other oral mucosa - sore throat or hoarseness - damage to heart, brain, nerves, lungs, other parts of body or loss of life  Patient voiced understanding and assent.)       Anesthesia Quick Evaluation

## 2024-01-17 NOTE — Anesthesia Procedure Notes (Signed)
 Spinal  Start time: 01/17/2024 12:00 PM End time: 01/17/2024 12:04 PM Staffing Resident/CRNA: Darrell Jewel I, CRNA Performed by: Darrell Jewel I, CRNA Authorized by: Rosaria Ferries, MD   Spinal Block Patient position: sitting Prep: ChloraPrep Patient monitoring: heart rate, cardiac monitor, continuous pulse ox and blood pressure Approach: midline Location: L3-4 Injection technique: single-shot Needle Needle type: Pencil-Tip  Needle gauge: 24 G Needle length: 9 cm Needle insertion depth: 7 cm Assessment Sensory level: T10

## 2024-01-18 ENCOUNTER — Encounter: Payer: Self-pay | Admitting: Orthopedic Surgery

## 2024-01-18 DIAGNOSIS — N1831 Chronic kidney disease, stage 3a: Secondary | ICD-10-CM | POA: Diagnosis not present

## 2024-01-18 DIAGNOSIS — Z7982 Long term (current) use of aspirin: Secondary | ICD-10-CM | POA: Diagnosis not present

## 2024-01-18 DIAGNOSIS — I129 Hypertensive chronic kidney disease with stage 1 through stage 4 chronic kidney disease, or unspecified chronic kidney disease: Secondary | ICD-10-CM | POA: Diagnosis not present

## 2024-01-18 DIAGNOSIS — M1711 Unilateral primary osteoarthritis, right knee: Secondary | ICD-10-CM | POA: Diagnosis not present

## 2024-01-18 DIAGNOSIS — Z79899 Other long term (current) drug therapy: Secondary | ICD-10-CM | POA: Diagnosis not present

## 2024-01-18 DIAGNOSIS — I25110 Atherosclerotic heart disease of native coronary artery with unstable angina pectoris: Secondary | ICD-10-CM | POA: Diagnosis not present

## 2024-01-18 DIAGNOSIS — Z951 Presence of aortocoronary bypass graft: Secondary | ICD-10-CM | POA: Diagnosis not present

## 2024-01-18 MED ORDER — CELECOXIB 200 MG PO CAPS: 200.0000 mg | ORAL_CAPSULE | Freq: Two times a day (BID) | ORAL | 1 refills | Status: AC

## 2024-01-18 MED ORDER — ASPIRIN EC 81 MG PO TBEC: 81.0000 mg | DELAYED_RELEASE_TABLET | Freq: Two times a day (BID) | ORAL | Status: AC

## 2024-01-18 MED ORDER — TRAMADOL HCL 50 MG PO TABS: 50.0000 mg | ORAL_TABLET | ORAL | 0 refills | Status: AC | PRN

## 2024-01-18 MED ORDER — OXYCODONE HCL 5 MG PO TABS: 5.0000 mg | ORAL_TABLET | ORAL | 0 refills | Status: AC | PRN

## 2024-01-18 MED ORDER — ACETAMINOPHEN 10 MG/ML IV SOLN
INTRAVENOUS | Status: AC
  Filled 2024-01-18: qty 100

## 2024-01-18 MED ORDER — NITROFURANTOIN MONOHYD MACRO 100 MG PO CAPS: 100.0000 mg | ORAL_CAPSULE | Freq: Two times a day (BID) | ORAL | 0 refills | Status: AC

## 2024-01-18 NOTE — Evaluation (Signed)
 Occupational Therapy Evaluation Patient Details Name: Timothy Hatfield MRN: [id number] DOB: 05-22-1946 Today's Date: 01/18/2024   History of Present Illness   Patient is a 78 year old with degenerative arthrosis of the right knee s/p right total knee arthroplasty. History of CABG     Clinical Impressions Timothy Hatfield was seen for OT evaluation this date, POD#1 from above surgery. Pt was independent in all ADLs prior to surgery, however occasionally using a LH reacher and sock aid for LB ADL management 2/2 R knee pain. Pt is eager to return to PLOF with less pain and improved safety and independence. Pt currently requires minimal assist for LB dressing while in seated position due to pain and limited AROM of R knee. Pt instructed in polar care mgt, falls prevention strategies, home/routines modifications, DME/AE for LB bathing and dressing tasks, and compression stocking mgt. Pt would benefit from skilled OT services including additional instruction in dressing techniques with or without assistive devices for dressing and bathing skills to support recall and carryover prior to discharge and ultimately to maximize safety, independence, and minimize falls risk and caregiver burden. Do not currently anticipate any OT needs following this hospitalization.        If plan is discharge home, recommend the following:   A little help with walking and/or transfers;A little help with bathing/dressing/bathroom;Assist for transportation;Help with stairs or ramp for entrance;Assistance with cooking/housework     Functional Status Assessment   Patient has had a recent decline in their functional status and demonstrates the ability to make significant improvements in function in a reasonable and predictable amount of time.     Equipment Recommendations   None recommended by OT (Pt has necessary equipment.)     Recommendations for Other Services         Precautions/Restrictions    Precautions Precautions: Fall;Knee Recall of Precautions/Restrictions: Intact Restrictions Weight Bearing Restrictions Per Provider Order: Yes RLE Weight Bearing Per Provider Order: Weight bearing as tolerated     Mobility Bed Mobility Overal bed mobility: Needs Assistance Bed Mobility: Supine to Sit, Sit to Supine     Supine to sit: HOB elevated, Used rails Sit to supine: HOB elevated, Used rails, Supervision        Transfers Overall transfer level: Needs assistance Equipment used: Rolling walker (2 wheels) Transfers: Sit to/from Stand Sit to Stand: Supervision                  Balance Overall balance assessment: Needs assistance Sitting-balance support: Feet supported Sitting balance-Leahy Scale: Good     Standing balance support: Bilateral upper extremity supported, Reliant on assistive device for balance, During functional activity, Single extremity supported Standing balance-Leahy Scale: Fair Standing balance comment: using rolling walker for support in standing with at least 1 UE during session.                           ADL either performed or assessed with clinical judgement   ADL Overall ADL's : Needs assistance/impaired                                       General ADL Comments: MIN A for LB dressing from STS with min cueing for safety and use of AE t/o session. SUPERVISION for functional transfers/bed mobiltiy. Pt return verbalizes understanding, would benefit from further reinforcement.     Vision Baseline  Vision/History: 1 Wears glasses Ability to See in Adequate Light: 1 Impaired Patient Visual Report: No change from baseline       Perception         Praxis         Pertinent Vitals/Pain Pain Assessment Pain Assessment: 0-10 Pain Score: 2  Pain Location: R knee Pain Descriptors / Indicators: Discomfort, Guarding Pain Intervention(s): Limited activity within patient's tolerance, Monitored during session,  Repositioned, Ice applied, Utilized relaxation techniques     Extremity/Trunk Assessment Upper Extremity Assessment Upper Extremity Assessment: Overall WFL for tasks assessed   Lower Extremity Assessment Lower Extremity Assessment: Defer to PT evaluation;RLE deficits/detail RLE Deficits / Details: s/p R TKA, WBAT       Communication Communication Communication: No apparent difficulties   Cognition Arousal: Alert Behavior During Therapy: WFL for tasks assessed/performed                                 Following commands: Intact       Cueing  General Comments   Cueing Techniques: Verbal cues;Visual cues      Exercises Other Exercises Other Exercises: Pt educated in falls prevention strategies, safe use of AE/DME for LB ADL management, compression stocking management, polar care management, complementary alternative methods for pain management including gentle self-massage and distraction techniques, and routines modifications to support safety and fxl independence during meaningful occupations of daily life.   Shoulder Instructions      Home Living Family/patient expects to be discharged to:: Private residence Living Arrangements: Alone Available Help at Discharge: Family;Available 24 hours/day (will have 24 hour help x 2 weeks) Type of Home: House Home Access: Stairs to enter Entrance Stairs-Number of Steps: 4 Entrance Stairs-Rails: Right Home Layout: Two level;Able to live on main level with bedroom/bathroom         Bathroom Toilet: Standard     Home Equipment: Rollator (4 wheels);Cane - single point;Shower seat;Adaptive equipment Adaptive Equipment: Reacher;Sock aid Additional Comments: Has multiple reachers t/o home he uses for ADL & IADL management      Prior Functioning/Environment Prior Level of Function : Independent/Modified Independent             Mobility Comments: independent, no device ADLs Comments: Indep with ADL/IADL uses  AE for LB access at baseline.    OT Problem List: Decreased coordination;Decreased safety awareness;Impaired balance (sitting and/or standing);Decreased knowledge of use of DME or AE;Decreased knowledge of precautions;Pain   OT Treatment/Interventions: Self-care/ADL training;Therapeutic exercise;DME and/or AE instruction;Patient/family education;Energy conservation;Balance training      OT Goals(Current goals can be found in the care plan section)   Acute Rehab OT Goals Patient Stated Goal: To go home OT Goal Formulation: With patient Time For Goal Achievement: 02/01/24 Potential to Achieve Goals: Good ADL Goals Pt Will Perform Grooming: sitting;with modified independence Pt Will Perform Lower Body Dressing: with adaptive equipment;sit to/from stand;with modified independence Pt Will Transfer to Toilet: ambulating;with modified independence;bedside commode;regular height toilet Pt Will Perform Toileting - Clothing Manipulation and hygiene: with modified independence;sit to/from stand;with adaptive equipment   OT Frequency:  Min 1X/week    Co-evaluation              AM-PAC OT "6 Clicks" Daily Activity     Outcome Measure Help from another person eating meals?: None Help from another person taking care of personal grooming?: None Help from another person toileting, which includes using toliet, bedpan, or urinal?: A Little  Help from another person bathing (including washing, rinsing, drying)?: A Little Help from another person to put on and taking off regular upper body clothing?: None Help from another person to put on and taking off regular lower body clothing?: A Little 6 Click Score: 21   End of Session Equipment Utilized During Treatment: Gait belt;Rolling walker (2 wheels) Nurse Communication: Mobility status  Activity Tolerance: Patient tolerated treatment well Patient left: in bed;with call bell/phone within reach;with bed alarm set  OT Visit Diagnosis: Other  abnormalities of gait and mobility (R26.89);Pain Pain - Right/Left: Right Pain - part of body: Knee                Time: 1610-9604 OT Time Calculation (min): 20 min Charges:  OT General Charges $OT Visit: 1 Visit OT Evaluation $OT Eval Low Complexity: 1 Low OT Treatments $Self Care/Home Management : 8-22 mins  Rockney Ghee, M.S., OTR/L 01/18/24, 11:11 AM

## 2024-01-18 NOTE — Evaluation (Signed)
 Physical Therapy Evaluation Patient Details Name: Timothy Hatfield MRN: [id number] DOB: 04-Aug-1946 Today's Date: 01/18/2024  History of Present Illness  Patient is a 78 year old with degenerative arthrosis of the right knee s/p right total knee arthroplasty. History of CABG  Clinical Impression  Patient is agreeable to PT evaluation. He is independent at baseline and lives alone. He has arranged for 2 weeks of family support at home if needed. He also has DME in place at home that he is requesting to use at discharge.  Patient is doing well with mobility today. Gait training initiated with hallway ambulation using rolling walker with occasional cues for improved gait kinematics. He completed stair training with initial instruction on sequencing. Mobility is adequate for discharge home with family support. PT will continue to follow if patient does not discharge as anticipated.     If plan is discharge home, recommend the following: Assist for transportation;Help with stairs or ramp for entrance   Can travel by private vehicle        Equipment Recommendations Rolling walker (2 wheels) (patient wants to use his 4 wheeled walker and equipment that he already has at home)  Recommendations for Other Services       Functional Status Assessment Patient has had a recent decline in their functional status and demonstrates the ability to make significant improvements in function in a reasonable and predictable amount of time.     Precautions / Restrictions Precautions Precautions: Fall;Knee Precaution Booklet Issued: Yes (comment) Restrictions Weight Bearing Restrictions Per Provider Order: Yes RLE Weight Bearing Per Provider Order: Weight bearing as tolerated      Mobility  Bed Mobility Overal bed mobility: Needs Assistance Bed Mobility: Supine to Sit, Sit to Supine     Supine to sit: Supervision Sit to supine: Supervision        Transfers Overall transfer level: Needs  assistance Equipment used: Rolling walker (2 wheels) Transfers: Sit to/from Stand Sit to Stand: Supervision           General transfer comment: cues for RLE positioning with sitting    Ambulation/Gait Ambulation/Gait assistance: Supervision Gait Distance (Feet): 175 Feet Assistive device: Rolling walker (2 wheels) Gait Pattern/deviations: Decreased step length - right, Decreased stance time - right, Decreased dorsiflexion - right, Antalgic Gait velocity: decreased     General Gait Details: cues to increase step length on RLE with  heel toe gait pattern with carry over demonstrated  Stairs Stairs: Yes Stairs assistance: Supervision Stair Management: Two rails, Step to pattern, Forwards Number of Stairs: 4 General stair comments: patient went up/down 4 steps  after initial instruction on technique  Wheelchair Mobility     Tilt Bed    Modified Rankin (Stroke Patients Only)       Balance Overall balance assessment: Needs assistance Sitting-balance support: Feet supported Sitting balance-Leahy Scale: Good     Standing balance support: Bilateral upper extremity supported Standing balance-Leahy Scale: Fair Standing balance comment: using rolling walker for support in standing                             Pertinent Vitals/Pain Pain Assessment Pain Assessment: 0-10 Pain Score: 3  Pain Location: R knee Pain Descriptors / Indicators: Discomfort Pain Intervention(s): Limited activity within patient's tolerance, Monitored during session, Repositioned (polar care applied at end of session)    Home Living Family/patient expects to be discharged to:: Private residence Living Arrangements: Alone Available Help at Discharge:  Family;Available 24 hours/day (will have 24 hour help x 2 weeks) Type of Home: House Home Access: Stairs to enter Entrance Stairs-Rails: Right Entrance Stairs-Number of Steps: 4   Home Layout: Two level;Able to live on main level with  bedroom/bathroom Home Equipment: Rollator (4 wheels);Cane - single point;Shower seat      Prior Function Prior Level of Function : Independent/Modified Independent             Mobility Comments: independent, no device       Extremity/Trunk Assessment   Upper Extremity Assessment Upper Extremity Assessment: Overall WFL for tasks assessed    Lower Extremity Assessment Lower Extremity Assessment: RLE deficits/detail RLE Deficits / Details: patient able to SLR independently. no knee buckling with weight bearing       Communication   Communication Communication: No apparent difficulties    Cognition Arousal: Alert Behavior During Therapy: WFL for tasks assessed/performed   PT - Cognitive impairments: No apparent impairments                         Following commands: Intact       Cueing Cueing Techniques: Verbal cues, Visual cues     General Comments General comments (skin integrity, edema, etc.): education provided on positioning to faciliate knee ROM.    Exercises Total Joint Exercises Goniometric ROM: R knee 5-92 degrees Other Exercises Other Exercises: provided  TKA HEP   Assessment/Plan    PT Assessment Patient needs continued PT services  PT Problem List Decreased strength;Decreased range of motion;Decreased balance;Decreased activity tolerance;Decreased mobility       PT Treatment Interventions Gait training;DME instruction;Functional mobility training;Stair training;Therapeutic activities;Therapeutic exercise;Balance training;Neuromuscular re-education;Cognitive remediation    PT Goals (Current goals can be found in the Care Plan section)  Acute Rehab PT Goals Patient Stated Goal: to go home PT Goal Formulation: With patient Time For Goal Achievement: 02/01/24 Potential to Achieve Goals: Fair    Frequency BID     Co-evaluation               AM-PAC PT "6 Clicks" Mobility  Outcome Measure Help needed turning from your back  to your side while in a flat bed without using bedrails?: None Help needed moving from lying on your back to sitting on the side of a flat bed without using bedrails?: A Little Help needed moving to and from a bed to a chair (including a wheelchair)?: A Little Help needed standing up from a chair using your arms (e.g., wheelchair or bedside chair)?: A Little Help needed to walk in hospital room?: A Little Help needed climbing 3-5 steps with a railing? : A Little 6 Click Score: 19    End of Session Equipment Utilized During Treatment: Gait belt Activity Tolerance: Patient tolerated treatment well Patient left: in bed;with call bell/phone within reach (polar care on) Nurse Communication: Mobility status PT Visit Diagnosis: Difficulty in walking, not elsewhere classified (R26.2);Other abnormalities of gait and mobility (R26.89)    Time: 1610-9604 PT Time Calculation (min) (ACUTE ONLY): 28 min   Charges:   PT Evaluation $PT Eval Low Complexity: 1 Low PT Treatments $Gait Training: 8-22 mins PT General Charges $$ ACUTE PT VISIT: 1 Visit         Donna Bernard, PT, MPT   [employer] 01/18/2024, 9:52 AM

## 2024-01-18 NOTE — TOC Initial Note (Signed)
 Transition of Care [facility]) - Initial/Assessment Note    Patient Details  Name: Timothy Hatfield MRN: [id number] Date of Birth: May 15, 1946  Transition of [facility]) CM/SW Contact:    Marlowe Sax, RN Phone Number: 01/18/2024, 9:03 AM  Clinical Narrative:                                                      [facility] Is set up for [facility] prior to surgery by Surgeons office  DME at home       Patient Goals and CMS Choice            Expected Discharge Plan and Services         Expected Discharge Date: 01/18/24                                    Prior Living Arrangements/Services                       Activities of Daily Living   ADL Screening (condition at time of admission) Independently performs ADLs?: Yes (appropriate for developmental age) Is the patient deaf or have difficulty hearing?: No Does the patient have difficulty seeing, even when wearing glasses/contacts?: No Does the patient have difficulty concentrating, remembering, or making decisions?: No  Permission Sought/Granted                  Emotional Assessment              Admission diagnosis:  History of total knee arthroplasty, right [Z96.651] Patient Active Problem List   Diagnosis Date Noted   History of total knee arthroplasty, right 01/17/2024   Hematemesis without nausea 11/12/2023   Upper GI bleeding 11/11/2023   Acute cystitis with hematuria 07/17/2022   Bilateral lower extremity edema 07/17/2022   Hypokalemia 07/17/2022   OSA (obstructive sleep apnea) 07/17/2022   Respiratory alkalosis 07/17/2022   Stage 3a chronic kidney disease (HCC) 07/17/2022   Urinary tract infection 07/17/2022   Primary osteoarthritis of one knee, right 03/17/2022   Benign prostatic hyperplasia with lower urinary tract symptoms 01/12/2020   GERD without esophagitis 03/25/2018   Moderate episode of recurrent major depressive disorder (HCC) 03/25/2018   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    Coronary artery disease involving native coronary artery of native heart with unstable angina pectoris (HCC) 09/14/2016   Acute calcific periarthritis 06/18/2015   Allergic rhinitis 06/18/2015   Arthritis 06/18/2015   Essential (primary) hypertension 06/18/2015   Barrett esophagus 06/18/2015   HLD (hyperlipidemia) 06/18/2015   External hemorrhoid 06/18/2015   Gastroenteritis 06/18/2015   Bilateral nephrolithiasis 05/15/2014   PCP:  Duanne Limerick, MD Pharmacy:   [facility] - [place], [place] - [address] [place] [address] Phone: [phone] Fax: [phone]  [id number] - [facility] - [address] [place] [address] Phone: [phone] Fax: [phone]     Social Drivers of Health (SDOH) Social History: SDOH Screenings   Food Insecurity: No Food Insecurity (01/17/2024)  Housing: Low Risk  (01/17/2024)  Transportation Needs: No Transportation Needs (01/17/2024)  Utilities: Not At Risk (01/17/2024)  Alcohol Screen: Low Risk  (02/10/2023)  Depression (PHQ2-9): Low Risk  (11/18/2023)  Financial Resource Strain: Low Risk  (12/09/2023)   Received from [facility]  Physical Activity: Insufficiently Active (02/10/2023)  Social Connections: Moderately Isolated (01/17/2024)  Stress: No Stress Concern Present (02/10/2023)  Tobacco Use: Low Risk  (01/17/2024)   SDOH Interventions:     Readmission Risk Interventions     No data to display

## 2024-01-18 NOTE — Anesthesia Postprocedure Evaluation (Signed)
 Anesthesia Post Note  Patient: Timothy Hatfield  Procedure(s) Performed: COMPUTER ASSISTED TOTAL KNEE ARTHROPLASTY (Right: Knee)  Patient location during evaluation: Nursing Unit Anesthesia Type: Spinal Level of consciousness: oriented and awake and alert Pain management: pain level controlled Vital Signs Assessment: post-procedure vital signs reviewed and stable Respiratory status: spontaneous breathing and respiratory function stable Cardiovascular status: blood pressure returned to baseline and stable Postop Assessment: no headache, no backache, no apparent nausea or vomiting and patient able to bend at knees Anesthetic complications: no  No notable events documented.   Last Vitals:  Vitals:   01/18/24 0346 01/18/24 0735  BP: (!) 154/81 (!) 157/73  Pulse: 66 74  Resp: 16 18  Temp:  36.7 C  SpO2: 96% 95%    Last Pain:  Vitals:   01/18/24 0735  TempSrc: Oral  PainSc: 1                  Devon Pretty B Alonza Smoker

## 2024-01-18 NOTE — Plan of Care (Signed)
   Problem: Coping: Goal: Level of anxiety will decrease Outcome: Progressing   Problem: Elimination: Goal: Will not experience complications related to urinary retention Outcome: Progressing   Problem: Pain Managment: Goal: General experience of comfort will improve and/or be controlled Outcome: Progressing

## 2024-01-18 NOTE — Progress Notes (Addendum)
 Discharge Summary  Discharge Plan: Timothy Hatfield will be discharged home as per the MD's order. We discussed prescriptions and follow-up appointments with the patient. The prescriptions were provided, and the medication list was explained in detail. Patient confirmed understanding of the instructions.  Skin Assessment: The patient's skin is clean, dry, and intact, with no signs of breakdown or tears. The IV catheter was removed, and the skin remains intact. The site shows no signs or symptoms of complications. A dressing and pressure were applied to the site. Patient reports no pain and has no complaints.  After-Visit Summary: An After-Visit Summary was printed and given to the patient. The following items were sent home with patient:  3-in 1 Bedside commode Polar care Two honeycomb bandages Two TED hose on both legs  The patient was escorted via wheelchair and discharged home in a private vehicle.  Cyprian Gongaware D. Lawerance Bach, RN

## 2024-01-19 DIAGNOSIS — I2510 Atherosclerotic heart disease of native coronary artery without angina pectoris: Secondary | ICD-10-CM | POA: Diagnosis not present

## 2024-01-19 DIAGNOSIS — D631 Anemia in chronic kidney disease: Secondary | ICD-10-CM | POA: Diagnosis not present

## 2024-01-19 DIAGNOSIS — Z471 Aftercare following joint replacement surgery: Secondary | ICD-10-CM | POA: Diagnosis not present

## 2024-01-19 DIAGNOSIS — G4733 Obstructive sleep apnea (adult) (pediatric): Secondary | ICD-10-CM | POA: Diagnosis not present

## 2024-01-19 DIAGNOSIS — N1831 Chronic kidney disease, stage 3a: Secondary | ICD-10-CM | POA: Diagnosis not present

## 2024-01-19 DIAGNOSIS — I700 Atherosclerosis of aorta: Secondary | ICD-10-CM | POA: Diagnosis not present

## 2024-01-19 DIAGNOSIS — I272 Pulmonary hypertension, unspecified: Secondary | ICD-10-CM | POA: Diagnosis not present

## 2024-01-19 DIAGNOSIS — N390 Urinary tract infection, site not specified: Secondary | ICD-10-CM | POA: Diagnosis not present

## 2024-01-19 DIAGNOSIS — I129 Hypertensive chronic kidney disease with stage 1 through stage 4 chronic kidney disease, or unspecified chronic kidney disease: Secondary | ICD-10-CM | POA: Diagnosis not present

## 2024-01-21 DIAGNOSIS — Z471 Aftercare following joint replacement surgery: Secondary | ICD-10-CM | POA: Diagnosis not present

## 2024-01-21 DIAGNOSIS — I700 Atherosclerosis of aorta: Secondary | ICD-10-CM | POA: Diagnosis not present

## 2024-01-21 DIAGNOSIS — G4733 Obstructive sleep apnea (adult) (pediatric): Secondary | ICD-10-CM | POA: Diagnosis not present

## 2024-01-21 DIAGNOSIS — I129 Hypertensive chronic kidney disease with stage 1 through stage 4 chronic kidney disease, or unspecified chronic kidney disease: Secondary | ICD-10-CM | POA: Diagnosis not present

## 2024-01-21 DIAGNOSIS — N390 Urinary tract infection, site not specified: Secondary | ICD-10-CM | POA: Diagnosis not present

## 2024-01-21 DIAGNOSIS — N1831 Chronic kidney disease, stage 3a: Secondary | ICD-10-CM | POA: Diagnosis not present

## 2024-01-21 DIAGNOSIS — D631 Anemia in chronic kidney disease: Secondary | ICD-10-CM | POA: Diagnosis not present

## 2024-01-21 DIAGNOSIS — I272 Pulmonary hypertension, unspecified: Secondary | ICD-10-CM | POA: Diagnosis not present

## 2024-01-21 DIAGNOSIS — I2510 Atherosclerotic heart disease of native coronary artery without angina pectoris: Secondary | ICD-10-CM | POA: Diagnosis not present

## 2024-01-24 ENCOUNTER — Telehealth: Payer: Self-pay | Admitting: Family Medicine

## 2024-01-24 DIAGNOSIS — I272 Pulmonary hypertension, unspecified: Secondary | ICD-10-CM | POA: Diagnosis not present

## 2024-01-24 DIAGNOSIS — I700 Atherosclerosis of aorta: Secondary | ICD-10-CM | POA: Diagnosis not present

## 2024-01-24 DIAGNOSIS — D631 Anemia in chronic kidney disease: Secondary | ICD-10-CM | POA: Diagnosis not present

## 2024-01-24 DIAGNOSIS — N1831 Chronic kidney disease, stage 3a: Secondary | ICD-10-CM | POA: Diagnosis not present

## 2024-01-24 DIAGNOSIS — I129 Hypertensive chronic kidney disease with stage 1 through stage 4 chronic kidney disease, or unspecified chronic kidney disease: Secondary | ICD-10-CM | POA: Diagnosis not present

## 2024-01-24 DIAGNOSIS — N390 Urinary tract infection, site not specified: Secondary | ICD-10-CM | POA: Diagnosis not present

## 2024-01-24 DIAGNOSIS — I2510 Atherosclerotic heart disease of native coronary artery without angina pectoris: Secondary | ICD-10-CM | POA: Diagnosis not present

## 2024-01-24 DIAGNOSIS — G4733 Obstructive sleep apnea (adult) (pediatric): Secondary | ICD-10-CM | POA: Diagnosis not present

## 2024-01-24 DIAGNOSIS — Z471 Aftercare following joint replacement surgery: Secondary | ICD-10-CM | POA: Diagnosis not present

## 2024-01-24 NOTE — Telephone Encounter (Signed)
 Copied from CRM [id number]. Topic: Clinical - Prescription Issue >> Jan 24, 2024 11:16 AM Timothy Hatfield wrote: Reason for CRM: Patient's CPAP machine stopped working - He was advised that he needs a new prescription to obtain another - Patient would prefer to pick one up need [place] and if we could check whether the insurance would pay for it. Please call patient to further assist.

## 2024-01-25 NOTE — Telephone Encounter (Signed)
 Tried calling pt. Unable to reach him. We do not usually fax CPAP machines to local supply stores unless patient will be paying out of pocket. Sent order for new CPAP machine and supplies to [facility] Fax# [phone].  They will contact patient to schedule shipping.

## 2024-01-26 DIAGNOSIS — I700 Atherosclerosis of aorta: Secondary | ICD-10-CM | POA: Diagnosis not present

## 2024-01-26 DIAGNOSIS — N390 Urinary tract infection, site not specified: Secondary | ICD-10-CM | POA: Diagnosis not present

## 2024-01-26 DIAGNOSIS — D631 Anemia in chronic kidney disease: Secondary | ICD-10-CM | POA: Diagnosis not present

## 2024-01-26 DIAGNOSIS — I272 Pulmonary hypertension, unspecified: Secondary | ICD-10-CM | POA: Diagnosis not present

## 2024-01-26 DIAGNOSIS — Z471 Aftercare following joint replacement surgery: Secondary | ICD-10-CM | POA: Diagnosis not present

## 2024-01-26 DIAGNOSIS — N1831 Chronic kidney disease, stage 3a: Secondary | ICD-10-CM | POA: Diagnosis not present

## 2024-01-26 DIAGNOSIS — G4733 Obstructive sleep apnea (adult) (pediatric): Secondary | ICD-10-CM | POA: Diagnosis not present

## 2024-01-26 DIAGNOSIS — I2510 Atherosclerotic heart disease of native coronary artery without angina pectoris: Secondary | ICD-10-CM | POA: Diagnosis not present

## 2024-01-26 DIAGNOSIS — I129 Hypertensive chronic kidney disease with stage 1 through stage 4 chronic kidney disease, or unspecified chronic kidney disease: Secondary | ICD-10-CM | POA: Diagnosis not present

## 2024-01-26 NOTE — Telephone Encounter (Signed)
 Advised patient of where order for CPAP machine was faxed.

## 2024-01-28 DIAGNOSIS — N390 Urinary tract infection, site not specified: Secondary | ICD-10-CM | POA: Diagnosis not present

## 2024-01-28 DIAGNOSIS — I272 Pulmonary hypertension, unspecified: Secondary | ICD-10-CM | POA: Diagnosis not present

## 2024-01-28 DIAGNOSIS — I2510 Atherosclerotic heart disease of native coronary artery without angina pectoris: Secondary | ICD-10-CM | POA: Diagnosis not present

## 2024-01-28 DIAGNOSIS — D631 Anemia in chronic kidney disease: Secondary | ICD-10-CM | POA: Diagnosis not present

## 2024-01-28 DIAGNOSIS — I700 Atherosclerosis of aorta: Secondary | ICD-10-CM | POA: Diagnosis not present

## 2024-01-28 DIAGNOSIS — I129 Hypertensive chronic kidney disease with stage 1 through stage 4 chronic kidney disease, or unspecified chronic kidney disease: Secondary | ICD-10-CM | POA: Diagnosis not present

## 2024-01-28 DIAGNOSIS — Z471 Aftercare following joint replacement surgery: Secondary | ICD-10-CM | POA: Diagnosis not present

## 2024-01-28 DIAGNOSIS — G4733 Obstructive sleep apnea (adult) (pediatric): Secondary | ICD-10-CM | POA: Diagnosis not present

## 2024-01-28 DIAGNOSIS — N1831 Chronic kidney disease, stage 3a: Secondary | ICD-10-CM | POA: Diagnosis not present

## 2024-01-31 DIAGNOSIS — G4733 Obstructive sleep apnea (adult) (pediatric): Secondary | ICD-10-CM | POA: Diagnosis not present

## 2024-01-31 DIAGNOSIS — I2510 Atherosclerotic heart disease of native coronary artery without angina pectoris: Secondary | ICD-10-CM | POA: Diagnosis not present

## 2024-01-31 DIAGNOSIS — Z471 Aftercare following joint replacement surgery: Secondary | ICD-10-CM | POA: Diagnosis not present

## 2024-01-31 DIAGNOSIS — D631 Anemia in chronic kidney disease: Secondary | ICD-10-CM | POA: Diagnosis not present

## 2024-01-31 DIAGNOSIS — I129 Hypertensive chronic kidney disease with stage 1 through stage 4 chronic kidney disease, or unspecified chronic kidney disease: Secondary | ICD-10-CM | POA: Diagnosis not present

## 2024-01-31 DIAGNOSIS — I700 Atherosclerosis of aorta: Secondary | ICD-10-CM | POA: Diagnosis not present

## 2024-01-31 DIAGNOSIS — N390 Urinary tract infection, site not specified: Secondary | ICD-10-CM | POA: Diagnosis not present

## 2024-01-31 DIAGNOSIS — N1831 Chronic kidney disease, stage 3a: Secondary | ICD-10-CM | POA: Diagnosis not present

## 2024-01-31 DIAGNOSIS — I272 Pulmonary hypertension, unspecified: Secondary | ICD-10-CM | POA: Diagnosis not present

## 2024-02-02 ENCOUNTER — Telehealth: Payer: Self-pay | Admitting: Family Medicine

## 2024-02-02 NOTE — Telephone Encounter (Signed)
 Copied from CRM [id number]. Topic: Clinical - Home Health Verbal Orders >> Feb 02, 2024 11:51 AM DeAngela L wrote: Caller/Agency: [facility]  Callback Number: [phone] ext [phone] Service Requested: Face to face visit sheet or tela visit form to order him a new cpap

## 2024-02-04 ENCOUNTER — Telehealth: Payer: Self-pay

## 2024-02-04 ENCOUNTER — Telehealth: Payer: Self-pay | Admitting: Family Medicine

## 2024-02-04 NOTE — Telephone Encounter (Signed)
 Copied from CRM [id number]. Topic: Clinical - Prescription Issue >> Feb 04, 2024 11:18 AM Timothy Hatfield wrote: Reason for CRM: The patient's daughter has been contacted by their CPAP machine provider Timothy Hatfield, Timothy Hatfield [phone]) and been told that documents proving of a face to face visit within the past year are needed in order to fulfill their replacement machine order   Please contact further when possible

## 2024-02-04 NOTE — Telephone Encounter (Signed)
 Copied from CRM [id number]. Topic: General - Other >> Feb 04, 2024 10:46 AM Priscille Loveless wrote: Reason for CRM: Shelli at [facility], [phone] ext [phone] needing, ov or telehealth visit that he uses his cpap machine and needs it.

## 2024-02-04 NOTE — Telephone Encounter (Signed)
 Home health needing a call about CPAP machine

## 2024-02-04 NOTE — Telephone Encounter (Signed)
 Patient needs appt. Thank you.  JM

## 2024-02-04 NOTE — Telephone Encounter (Signed)
 Timothy Hatfield stated that his daughter is picking up a cpap for him. Does not need appt

## 2024-02-04 NOTE — Telephone Encounter (Signed)
 Spoke with [facility] rep and patient does need a appt as we do not have a recent encounter within the last 12 months were patient discussed the benefits of his CPAP and that he uses it nightly.

## 2024-02-06 ENCOUNTER — Encounter: Payer: Self-pay | Admitting: Family Medicine

## 2024-02-07 ENCOUNTER — Ambulatory Visit: Admitting: Family Medicine

## 2024-02-17 ENCOUNTER — Telehealth: Payer: Self-pay

## 2024-02-17 NOTE — Telephone Encounter (Signed)
 Please call pt for an appointment. Need a face to face appointment for pt to be able to get CPAP supplies.KP

## 2024-03-02 ENCOUNTER — Ambulatory Visit: Payer: Self-pay

## 2024-03-02 NOTE — Telephone Encounter (Signed)
 Chief Complaint: lightheadedness, swimming head Symptoms: see notes Frequency: 2-3 days Pertinent Negatives: Patient denies numbnes/weakness of face, arms, legs, drooping in face Disposition: [] ED /[x] Urgent Care (no appt availability in office) / [] Appointment(In office/virtual)/ []  [place] [facility]/ [] Home Care/ [] Refused Recommended Disposition /[] [place] [place] [facility]/ []  Follow-up with PCP Additional Notes: Patient called in stating he is feeling lightheaded and "swimming head", and like he just wants to go to sleep. Patient states he had knee replacement surgery roughly 1 month ago. Patient denies being a diabetic, denies hx of seizures. Patient states he has not taken any new medication or OTC medication today, and denies recent falls. Patient states the last time he felt like this was 10 years ago after a Bypass surgery when he had pneumonia. This RN stated patient should get evaluated today at Urgent Care. Patient states he is in [place] on [address] and there is a new urgent care nearby and states he is going to drive there right now and should be there in 5 minutes.    Copied from CRM [id number]. Topic: Clinical - Red Word Triage >> Mar 02, 2024  4:17 PM Pierre Bali B wrote: [facility] that prompted transfer to Nurse Triage: dizzy and light headed Reason for Disposition  Patient sounds very sick or weak to the triager  Answer Assessment - Initial Assessment Questions 1. DESCRIPTION: "Describe your dizziness."     Lightheaded, "swimming head",  2. LIGHTHEADED: "Do you feel lightheaded?" (e.g., somewhat faint, woozy, weak upon standing)     Feel like I want to go to sleep 3. VERTIGO: "Do you feel like either you or the room is spinning or tilting?" (i.e. vertigo)     No spinning 4. SEVERITY: "How bad is it?"  "Do you feel like you are going to faint?" "Can you stand and walk?"   - MILD: Feels slightly dizzy, but walking normally.   - MODERATE: Feels unsteady when walking, but not  falling; interferes with normal activities (e.g., school, work).   - SEVERE: Unable to walk without falling, or requires assistance to walk without falling; feels like passing out now.      "Just feel really tired" 5. ONSET:  "When did the dizziness begin?"     A couple of days  6. AGGRAVATING FACTORS: "Does anything make it worse?" (e.g., standing, change in head position)     Feel better when outside with fresh air 7. HEART RATE: "Can you tell me your heart rate?" "How many beats in 15 seconds?"  (Note: not all patients can do this)       N/a 8. CAUSE: "What do you think is causing the dizziness?"     N/a 9. RECURRENT SYMPTOM: "Have you had dizziness before?" If Yes, ask: "When was the last time?" "What happened that time?"      10 years ago had pneumonia  10. OTHER SYMPTOMS: "Do you have any other symptoms?" (e.g., fever, chest pain, vomiting, diarrhea, bleeding)       Diarrhea, fever  Protocols used: Dizziness - Lightheadedness-A-AH

## 2024-03-02 NOTE — Telephone Encounter (Signed)
Noted ° °Pt is going to UC. ° °KP °

## 2024-03-06 ENCOUNTER — Other Ambulatory Visit
Admission: RE | Admit: 2024-03-06 | Discharge: 2024-03-06 | Disposition: A | Discharge status: Home / Self Care | Source: Home / Self Care | Attending: Family Medicine | Admitting: Family Medicine

## 2024-03-06 ENCOUNTER — Encounter: Payer: Self-pay | Admitting: Family Medicine

## 2024-03-06 ENCOUNTER — Ambulatory Visit: Admitting: Family Medicine

## 2024-03-06 ENCOUNTER — Ambulatory Visit
Admission: RE | Admit: 2024-03-06 | Discharge: 2024-03-06 | Disposition: A | Discharge status: Home / Self Care | Source: Ambulatory Visit | Attending: Family Medicine | Admitting: Family Medicine

## 2024-03-06 ENCOUNTER — Ambulatory Visit
Admission: RE | Admit: 2024-03-06 | Discharge: 2024-03-06 | Disposition: A | Discharge status: Home / Self Care | Attending: Family Medicine | Admitting: Family Medicine

## 2024-03-06 VITALS — BP 120/66 | HR 68 | Resp 15 | Ht 67.0 in | Wt 191.3 lb

## 2024-03-06 DIAGNOSIS — I10 Essential (primary) hypertension: Secondary | ICD-10-CM

## 2024-03-06 DIAGNOSIS — R5383 Other fatigue: Secondary | ICD-10-CM

## 2024-03-06 DIAGNOSIS — R051 Acute cough: Secondary | ICD-10-CM

## 2024-03-06 DIAGNOSIS — Z8701 Personal history of pneumonia (recurrent): Secondary | ICD-10-CM

## 2024-03-06 DIAGNOSIS — D509 Iron deficiency anemia, unspecified: Secondary | ICD-10-CM | POA: Insufficient documentation

## 2024-03-06 DIAGNOSIS — Z9889 Other specified postprocedural states: Secondary | ICD-10-CM | POA: Insufficient documentation

## 2024-03-06 LAB — CBC WITH DIFFERENTIAL/PLATELET
Abs Immature Granulocytes: 0.03 10*3/uL (ref 0.00–0.07)
Basophils Absolute: 0.1 10*3/uL (ref 0.0–0.1)
Basophils Relative: 1 %
Eosinophils Absolute: 0.2 10*3/uL (ref 0.0–0.5)
Eosinophils Relative: 2 %
HCT: 41.3 % (ref 39.0–52.0)
Hemoglobin: 13.9 g/dL (ref 13.0–17.0)
Immature Granulocytes: 0 %
Lymphocytes Relative: 19 %
Lymphs Abs: 1.3 10*3/uL (ref 0.7–4.0)
MCH: 31.2 pg (ref 26.0–34.0)
MCHC: 33.7 g/dL (ref 30.0–36.0)
MCV: 92.8 fL (ref 80.0–100.0)
Monocytes Absolute: 0.6 10*3/uL (ref 0.1–1.0)
Monocytes Relative: 9 %
Neutro Abs: 4.7 10*3/uL (ref 1.7–7.7)
Neutrophils Relative %: 69 %
Platelets: 273 10*3/uL (ref 150–400)
RBC: 4.45 MIL/uL (ref 4.22–5.81)
RDW: 14.5 % (ref 11.5–15.5)
WBC: 6.8 10*3/uL (ref 4.0–10.5)
nRBC: 0 % (ref 0.0–0.2)

## 2024-03-06 LAB — RENAL FUNCTION PANEL
Albumin: 4.1 g/dL (ref 3.5–5.0)
Anion gap: 11 (ref 5–15)
BUN: 19 mg/dL (ref 8–23)
CO2: 27 mmol/L (ref 22–32)
Calcium: 9.2 mg/dL (ref 8.9–10.3)
Chloride: 101 mmol/L (ref 98–111)
Creatinine, Ser: 1.18 mg/dL (ref 0.61–1.24)
GFR, Estimated: >60 mL/min (ref >60)
Glucose, Bld: 165 mg/dL — ABNORMAL HIGH (ref 70–99)
Phosphorus: 2.8 mg/dL (ref 2.5–4.6)
Potassium: 3.3 mmol/L — ABNORMAL LOW (ref 3.5–5.1)
Sodium: 139 mmol/L (ref 135–145)

## 2024-03-06 MED ORDER — BENAZEPRIL HCL 10 MG PO TABS: 10.0000 mg | ORAL_TABLET | Freq: Every day | ORAL | 3 refills | Status: DC

## 2024-03-06 NOTE — Progress Notes (Signed)
 Date:  03/06/2024   Name:  Timothy Hatfield   DOB:  12-24-1945   MRN:  [id number]   Chief Complaint: Headache and Memory Loss  Headache  This is a new problem. The current episode started in the past 7 days. The problem occurs intermittently. The pain is located in the Bilateral region. The pain does not radiate. The quality of the pain is described as aching. The pain is at a severity of 2/10. The pain is mild. Associated symptoms include coughing, dizziness and weight loss. Pertinent negatives include no abdominal pain, back pain, drainage, ear pain, facial sweating, loss of balance, muscle aches, nausea, rhinorrhea, scalp tenderness, sinus pressure, sore throat, swollen glands, tingling or visual change.  Thyroid Problem Presents for initial visit. Symptoms include fatigue and weight loss. Patient reports no anxiety, cold intolerance, constipation, depressed mood, diaphoresis, diarrhea, dry skin, hair loss, heat intolerance, hoarse voice, leg swelling, nail problem, palpitations, tremors, visual change or weight gain. (fatigue) The symptoms have been stable.    Lab Results  Component Value Date   NA 139 01/10/2024   K 3.4 (L) 01/10/2024   CO2 25 01/10/2024   GLUCOSE 136 (H) 01/10/2024   BUN 17 01/10/2024   CREATININE 1.17 01/10/2024   CALCIUM 9.4 01/10/2024   EGFR 64 09/14/2023   GFRNONAA >60 01/10/2024   Lab Results  Component Value Date   CHOL 126 09/14/2023   HDL 43 09/14/2023   LDLCALC 56 09/14/2023   TRIG 157 (H) 09/14/2023   CHOLHDL 2.6 10/03/2018   No results found for: "TSH" No results found for: "HGBA1C" Lab Results  Component Value Date   WBC 6.5 01/10/2024   HGB 13.4 01/10/2024   HCT 41.6 01/10/2024   MCV 87.8 01/10/2024   PLT 254 01/10/2024   Lab Results  Component Value Date   ALT 15 01/10/2024   AST 24 01/10/2024   ALKPHOS 37 (L) 01/10/2024   BILITOT 0.7 01/10/2024   No results found for: "25OHVITD2", "25OHVITD3", "VD25OH"   Review of Systems   Constitutional:  Positive for fatigue and weight loss. Negative for diaphoresis and weight gain.  HENT:  Negative for ear pain, hoarse voice, rhinorrhea, sinus pressure and sore throat.   Eyes:  Negative for visual disturbance.  Respiratory:  Positive for cough. Negative for choking, chest tightness, shortness of breath and wheezing.   Cardiovascular:  Negative for chest pain, palpitations and leg swelling.  Gastrointestinal:  Negative for abdominal pain, constipation, diarrhea and nausea.  Endocrine: Negative for cold intolerance and heat intolerance.  Musculoskeletal:  Negative for back pain.  Neurological:  Positive for dizziness and headaches. Negative for tingling, tremors and loss of balance.  Psychiatric/Behavioral:  The patient is not nervous/anxious.     Patient Active Problem List   Diagnosis Date Noted   History of total knee arthroplasty, right 01/17/2024   Hematemesis without nausea 11/12/2023   Upper GI bleeding 11/11/2023   Acute cystitis with hematuria 07/17/2022   Bilateral lower extremity edema 07/17/2022   Hypokalemia 07/17/2022   OSA (obstructive sleep apnea) 07/17/2022   Respiratory alkalosis 07/17/2022   Stage 3a chronic kidney disease (HCC) 07/17/2022   Urinary tract infection 07/17/2022   Primary osteoarthritis of one knee, right 03/17/2022   Benign prostatic hyperplasia with lower urinary tract symptoms 01/12/2020   GERD without esophagitis 03/25/2018   Moderate episode of recurrent major depressive disorder (HCC) 03/25/2018   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   Coronary artery disease involving native coronary artery  of native heart with unstable angina pectoris (HCC) 09/14/2016   Acute calcific periarthritis 06/18/2015   Allergic rhinitis 06/18/2015   Arthritis 06/18/2015   Essential (primary) hypertension 06/18/2015   Barrett esophagus 06/18/2015   HLD (hyperlipidemia) 06/18/2015   External hemorrhoid 06/18/2015   Gastroenteritis 06/18/2015   Bilateral  nephrolithiasis 05/15/2014    Allergies  Allergen Reactions   Bee Venom Anaphylaxis   Ciprofloxacin Itching    Past Surgical History:  Procedure Laterality Date   CARDIAC CATHETERIZATION Left 09/09/2016   Procedure: Left Heart Cath and Coronary Angiography;  Surgeon: Antonette Batters, MD;  Location: [facility];  Service: Cardiovascular;  Laterality: Left;   CATARACT EXTRACTION W/ INTRAOCULAR LENS IMPLANT Right    COLONOSCOPY  2013   normal/ Dr Felicita Horns- cleared for 10 yrs   COLONOSCOPY  08/31/2007   COLONOSCOPY WITH PROPOFOL N/A 01/03/2018   Procedure: COLONOSCOPY WITH PROPOFOL;  Surgeon: Cassie Click, MD;  Location: [facility];  Service: Endoscopy;  Laterality: N/A;   CORONARY ARTERY BYPASS GRAFT N/A 10/02/2016   Procedure: CORONARY ARTERY BYPASS GRAFTING (4 vessels); Location: [facility]; Surgeon: Silver Dross, MD   CYSTOSCOPY W/ RETROGRADES Bilateral 07/11/2020   Procedure: CYSTOSCOPY WITH RETROGRADE PYELOGRAM;  Surgeon: Lawerence Pressman, MD;  Location: [facility];  Service: Urology;  Laterality: Bilateral;   CYSTOSCOPY W/ RETROGRADES  07/26/2020   Procedure: CYSTOSCOPY WITH RETROGRADE PYELOGRAM;  Surgeon: Lawerence Pressman, MD;  Location: [facility];  Service: Urology;;   CYSTOSCOPY W/ URETERAL STENT REMOVAL Right 07/26/2020   Procedure: CYSTOSCOPY WITH STENT REMOVAL;  Surgeon: Lawerence Pressman, MD;  Location: [facility];  Service: Urology;  Laterality: Right;   CYSTOSCOPY/URETEROSCOPY/HOLMIUM LASER/STENT PLACEMENT Bilateral 07/11/2020   Procedure: CYSTOSCOPY/URETEROSCOPY/HOLMIUM LASER/STENT PLACEMENT;  Surgeon: Lawerence Pressman, MD;  Location: [facility];  Service: Urology;  Laterality: Bilateral;   CYSTOSCOPY/URETEROSCOPY/HOLMIUM LASER/STENT PLACEMENT Left 07/26/2020   Procedure: CYSTOSCOPY/URETEROSCOPY/HOLMIUM LASER/STENT EXCHANGE;  Surgeon: Lawerence Pressman, MD;  Location: [facility];  Service: Urology;  Laterality: Left;   ESOPHAGOGASTRODUODENOSCOPY     2001, 2008, 2014    ESOPHAGOGASTRODUODENOSCOPY  11/12/2023   ESOPHAGOGASTRODUODENOSCOPY (EGD) WITH PROPOFOL N/A 01/03/2018   Procedure: ESOPHAGOGASTRODUODENOSCOPY (EGD) WITH PROPOFOL;  Surgeon: Cassie Click, MD;  Location: [facility] ENDOSCOPY;  Service: Endoscopy;  Laterality: N/A;   EXTRACORPOREAL SHOCK WAVE LITHOTRIPSY Left 07/11/2020   Procedure: EXTRACORPOREAL SHOCK WAVE LITHOTRIPSY (ESWL);  Surgeon: Lawerence Pressman, MD;  Location: [facility];  Service: Urology;  Laterality: Left;   IR NEPHROSTOMY PLACEMENT RIGHT  03/15/2018   KNEE ARTHROPLASTY Right 01/17/2024   Procedure: COMPUTER ASSISTED TOTAL KNEE ARTHROPLASTY;  Surgeon: Arlyne Lame, MD;  Location: [facility];  Service: Orthopedics;  Laterality: Right;   LITHOTRIPSY     LITHOTRIPSY     NEPHROLITHOTOMY Right 03/15/2018   Procedure: NEPHROLITHOTOMY PERCUTANEOUS;  Surgeon: Geraline Knapp, MD;  Location: [facility];  Service: Urology;  Laterality: Right;   STONE EXTRACTION WITH BASKET  07/11/2020   Procedure: BLADDER STONE EXTRACTION;  Surgeon: Lawerence Pressman, MD;  Location: [facility];  Service: Urology;;   UMBILICAL HERNIA REPAIR     UPPER GI ENDOSCOPY  2013    Social History   Tobacco Use   Smoking status: Never    Passive exposure: Never   Smokeless tobacco: Never  Vaping Use   Vaping status: Never Used  Substance Use Topics   Alcohol use: No    Alcohol/week: 0.0 standard drinks of alcohol   Drug use: No     Medication list has  been reviewed and updated.  No outpatient medications have been marked as taking for the 03/06/24 encounter (Office Visit) with Duanne Limerick, MD.       11/18/2023    3:25 PM 11/11/2023    2:43 PM 09/14/2023    1:57 PM 03/15/2023    3:01 PM  GAD 7 : Generalized Anxiety Score  Nervous, Anxious, on Edge 0 0 0 0  Control/stop worrying 0 0 0 0  Worry too much - different things 0 0 0 0  Trouble relaxing 0 0 0 0  Restless 0 0 0 0  Easily annoyed or irritable  0 0 0  Afraid - awful might happen 0 0 0 0   Total GAD 7 Score  0 0 0  Anxiety Difficulty Not difficult at all Not difficult at all Not difficult at all Not difficult at all       11/18/2023    3:24 PM 11/11/2023    2:42 PM 09/14/2023    1:57 PM  Depression screen PHQ 2/9  Decreased Interest 1 0 0  Down, Depressed, Hopeless 1 1 0  PHQ - 2 Score 2 1 0  Altered sleeping 0 0 0  Tired, decreased energy 0 1 1  Change in appetite 0 0 0  Feeling bad or failure about yourself  0 0 0  Trouble concentrating 0 0 0  Moving slowly or fidgety/restless 0 0 0  Suicidal thoughts 0 0 0  PHQ-9 Score 2 2 1   Difficult doing work/chores Not difficult at all Not difficult at all Not difficult at all    BP Readings from Last 3 Encounters:  01/18/24 (!) 155/74  01/10/24 135/88  12/17/23 138/74    Physical Exam Vitals and nursing note reviewed.  Constitutional:      Appearance: He is well-developed.  HENT:     Head: Normocephalic and atraumatic.     Right Ear: Tympanic membrane, ear canal and external ear normal.     Left Ear: Tympanic membrane, ear canal and external ear normal.     Nose: Nose normal.     Mouth/Throat:     Dentition: Normal dentition.  Eyes:     General: Lids are normal. No scleral icterus.    Extraocular Movements:     Right eye: Normal extraocular motion.     Left eye: Normal extraocular motion.     Conjunctiva/sclera: Conjunctivae normal.     Pupils: Pupils are equal, round, and reactive to light.  Neck:     Thyroid: No thyromegaly.     Vascular: No carotid bruit, hepatojugular reflux or JVD.     Trachea: No tracheal deviation.     Meningeal: Kernig's sign present.  Cardiovascular:     Rate and Rhythm: Normal rate and regular rhythm.     Heart sounds: Normal heart sounds. No murmur heard.    No friction rub. No gallop.  Pulmonary:     Effort: Pulmonary effort is normal.     Breath sounds: Examination of the right-lower field reveals decreased breath sounds. Decreased breath sounds present. No wheezing,  rhonchi or rales.  Abdominal:     General: Bowel sounds are normal.     Palpations: Abdomen is soft. There is no hepatomegaly, splenomegaly or mass.     Tenderness: There is no abdominal tenderness.     Hernia: There is no hernia in the left inguinal area.  Musculoskeletal:        General: Normal range of motion.  Cervical back: Normal range of motion and neck supple. No rigidity.  Lymphadenopathy:     Cervical: No cervical adenopathy.  Skin:    General: Skin is warm and dry.     Findings: No rash.  Neurological:     Mental Status: He is alert and oriented to person, place, and time.     Sensory: No sensory deficit.     Deep Tendon Reflexes: Reflexes are normal and symmetric.  Psychiatric:        Mood and Affect: Mood is not anxious or depressed.     Wt Readings from Last 3 Encounters:  01/10/24 200 lb (90.7 kg)  12/17/23 199 lb 3.2 oz (90.4 kg)  11/18/23 194 lb (88 kg)    There were no vitals taken for this visit.  Assessment and Plan:  1. Fatigue, unspecified type (Primary) New onset.  Episodic.  Waxes and wanes in intensity.  Patient is recently switched out his CPAP and is convinced that this is likely due to pneumonia which is the way he presented in the past.  However when we did his exam there was decreased breath sounds in the right posterior lobe.  Given that he has had a cough we will obtain a chest x-ray to evaluate along with CBC and renal function panel.  Lab work was done on an urgent basis was all normal without leukocytosis or suggestion of pneumonia chest x-ray showed no pneumonia no enlargement of the heart however it was noted that the patient had an elevated right hemidiaphragm which would account for the decrease in breath sounds.  Given the cause-and-effect I do not think there is any need to consider pneumonia and patient is relieved to know that this is not in the likely differential. - Renal Function Panel - CBC with Differential/Platelet - DG Chest 2  View  2. Essential (primary) hypertension Chronic.  Controlled.  Stable.  Blood pressure 120/66.  Asymptomatic.  Tolerating medications well.  Given that the blood pressure is 120/66 we will discontinue his hydrochlorothiazide of the benazepril hydrochlorothiazide combination and just continue on benazepril 10 mg once a day to decrease his risk of diabetic nephropathy and maintain lower blood pressure - benazepril (LOTENSIN) 10 MG tablet; Take 1 tablet (10 mg total) by mouth daily.  Dispense: 90 tablet; Refill: 3  3. Microcytic anemia Patient with history of micro Citic anemia will check CBC for indices and hemoglobin count. - CBC with Differential/Platelet  4. Acute cough As noted above acute cough associated with excessive fatigue but no hypoxia.  Chest x-ray was done. - DG Chest 2 View  5. History of community acquired pneumonia Patient with history of community-acquired pneumonia which presented in this fashion several years ago and we will recheck chest x-ray to make sure there has not been any relapse.   Alayne Allis, MD

## 2024-03-07 ENCOUNTER — Encounter: Payer: Self-pay | Admitting: Family Medicine

## 2024-03-07 ENCOUNTER — Other Ambulatory Visit: Payer: Self-pay | Admitting: Family Medicine

## 2024-03-07 MED ORDER — POTASSIUM CHLORIDE CRYS ER 10 MEQ PO TBCR: 10.0000 meq | EXTENDED_RELEASE_TABLET | Freq: Two times a day (BID) | ORAL | 1 refills | Status: DC

## 2024-03-14 ENCOUNTER — Encounter: Payer: Self-pay | Admitting: Family Medicine

## 2024-03-14 ENCOUNTER — Ambulatory Visit: Payer: Self-pay | Admitting: Family Medicine

## 2024-03-14 VITALS — BP 113/68 | HR 76 | Resp 16 | Ht 67.0 in | Wt 197.2 lb

## 2024-03-14 DIAGNOSIS — I10 Essential (primary) hypertension: Secondary | ICD-10-CM

## 2024-03-14 DIAGNOSIS — D508 Other iron deficiency anemias: Secondary | ICD-10-CM

## 2024-03-14 DIAGNOSIS — K2270 Barrett's esophagus without dysplasia: Secondary | ICD-10-CM | POA: Diagnosis not present

## 2024-03-14 DIAGNOSIS — G4733 Obstructive sleep apnea (adult) (pediatric): Secondary | ICD-10-CM

## 2024-03-14 DIAGNOSIS — Z9989 Dependence on other enabling machines and devices: Secondary | ICD-10-CM | POA: Diagnosis not present

## 2024-03-14 DIAGNOSIS — E876 Hypokalemia: Secondary | ICD-10-CM

## 2024-03-14 DIAGNOSIS — E782 Mixed hyperlipidemia: Secondary | ICD-10-CM

## 2024-03-14 MED ORDER — PANTOPRAZOLE SODIUM 40 MG PO TBEC: 40.0000 mg | DELAYED_RELEASE_TABLET | Freq: Every day | ORAL | 1 refills | Status: AC

## 2024-03-14 MED ORDER — POTASSIUM CHLORIDE CRYS ER 10 MEQ PO TBCR: 10.0000 meq | EXTENDED_RELEASE_TABLET | Freq: Two times a day (BID) | ORAL | 1 refills | Status: AC

## 2024-03-14 MED ORDER — BENAZEPRIL HCL 10 MG PO TABS: 10.0000 mg | ORAL_TABLET | Freq: Every day | ORAL | 1 refills | Status: AC

## 2024-03-14 MED ORDER — METOPROLOL SUCCINATE ER 50 MG PO TB24: 50.0000 mg | ORAL_TABLET | Freq: Every day | ORAL | 1 refills | Status: AC

## 2024-03-14 MED ORDER — ROSUVASTATIN CALCIUM 5 MG PO TABS: 5.0000 mg | ORAL_TABLET | Freq: Every day | ORAL | 1 refills | Status: AC

## 2024-03-14 NOTE — Progress Notes (Signed)
 Date:  03/14/2024   Name:  Timothy Hatfield   DOB:  04-May-1946   MRN:  [id number]   Chief Complaint: Hypertension and Gastroesophageal Reflux  Patient is a 78 year old male who presents for a face to face OSA/c-Pap reevaluation exam. The patient reports the following problems: needs cpap because uses every night and not only does it benefit the patient , it also essential. Health maintenance has been reviewed up to date.    Hypertension This is a chronic problem. The current episode started more than 1 year ago. The problem has been rapidly worsening since onset. The problem is controlled. Pertinent negatives include no anxiety, blurred vision, chest pain, headaches, malaise/fatigue, neck pain, orthopnea, palpitations, peripheral edema, PND or shortness of breath. There are no associated agents to hypertension. Risk factors for coronary artery disease include dyslipidemia. Past treatments include diuretics, ACE inhibitors and beta blockers. The current treatment provides moderate improvement. There are no compliance problems.  There is no history of CAD/MI or CVA. There is no history of chronic renal disease, a hypertension causing med or renovascular disease.  Gastroesophageal Reflux He reports no chest pain, no dysphagia, no globus sensation, no hoarse voice or no nausea. This is a chronic problem. Pertinent negatives include no fatigue. He has tried a PPI for the symptoms. The treatment provided moderate relief.  Hyperlipidemia This is a chronic problem. The current episode started more than 1 year ago. The problem is controlled. He has no history of chronic renal disease. Pertinent negatives include no chest pain or shortness of breath. Current antihyperlipidemic treatment includes statins. The current treatment provides moderate improvement of lipids.    Lab Results  Component Value Date   NA 139 03/06/2024   K 3.3 (L) 03/06/2024   CO2 27 03/06/2024   GLUCOSE 165 (H) 03/06/2024    BUN 19 03/06/2024   CREATININE 1.18 03/06/2024   CALCIUM  9.2 03/06/2024   EGFR 64 09/14/2023   GFRNONAA >60 03/06/2024   Lab Results  Component Value Date   CHOL 126 09/14/2023   HDL 43 09/14/2023   LDLCALC 56 09/14/2023   TRIG 157 (H) 09/14/2023   CHOLHDL 2.6 10/03/2018   No results found for: "TSH" No results found for: "HGBA1C" Lab Results  Component Value Date   WBC 6.8 03/06/2024   HGB 13.9 03/06/2024   HCT 41.3 03/06/2024   MCV 92.8 03/06/2024   PLT 273 03/06/2024   Lab Results  Component Value Date   ALT 15 01/10/2024   AST 24 01/10/2024   ALKPHOS 37 (L) 01/10/2024   BILITOT 0.7 01/10/2024   No results found for: "25OHVITD2", "25OHVITD3", "VD25OH"   Review of Systems  Constitutional:  Negative for diaphoresis, fatigue, fever and malaise/fatigue.  HENT:  Negative for hoarse voice.   Eyes:  Negative for blurred vision.  Respiratory:  Negative for shortness of breath.   Cardiovascular:  Negative for chest pain, palpitations, orthopnea and PND.  Gastrointestinal:  Negative for dysphagia and nausea.  Musculoskeletal:  Negative for neck pain.  Neurological:  Negative for headaches.    Patient Active Problem List   Diagnosis Date Noted   History of total knee arthroplasty, right 01/17/2024   Hematemesis without nausea 11/12/2023   Upper GI bleeding 11/11/2023   Acute cystitis with hematuria 07/17/2022   Bilateral lower extremity edema 07/17/2022   Hypokalemia 07/17/2022   OSA (obstructive sleep apnea) 07/17/2022   Respiratory alkalosis 07/17/2022   Stage 3a chronic kidney disease (HCC) 07/17/2022  Urinary tract infection 07/17/2022   Primary osteoarthritis of one knee, right 03/17/2022   Benign prostatic hyperplasia with lower urinary tract symptoms 01/12/2020   GERD without esophagitis 03/25/2018   Moderate episode of recurrent major depressive disorder (HCC) 03/25/2018   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   Coronary artery disease involving native coronary artery  of native heart with unstable angina pectoris (HCC) 09/14/2016   Acute calcific periarthritis 06/18/2015   Allergic rhinitis 06/18/2015   Arthritis 06/18/2015   Essential (primary) hypertension 06/18/2015   Barrett esophagus 06/18/2015   HLD (hyperlipidemia) 06/18/2015   External hemorrhoid 06/18/2015   Gastroenteritis 06/18/2015   Bilateral nephrolithiasis 05/15/2014    Allergies  Allergen Reactions   Bee Venom Anaphylaxis   Ciprofloxacin  Itching    Past Surgical History:  Procedure Laterality Date   CARDIAC CATHETERIZATION Left 09/09/2016   Procedure: Left Heart Cath and Coronary Angiography;  Surgeon: Antonette Batters, MD;  Location: [facility];  Service: Cardiovascular;  Laterality: Left;   CATARACT EXTRACTION W/ INTRAOCULAR LENS IMPLANT Right    COLONOSCOPY  2013   normal/ Dr Felicita Horns- cleared for 10 yrs   COLONOSCOPY  08/31/2007   COLONOSCOPY WITH PROPOFOL  N/A 01/03/2018   Procedure: COLONOSCOPY WITH PROPOFOL ;  Surgeon: Cassie Click, MD;  Location: [facility];  Service: Endoscopy;  Laterality: N/A;   CORONARY ARTERY BYPASS GRAFT N/A 10/02/2016   Procedure: CORONARY ARTERY BYPASS GRAFTING (4 vessels); Location: [facility]; Surgeon: Silver Dross, MD   CYSTOSCOPY W/ RETROGRADES Bilateral 07/11/2020   Procedure: CYSTOSCOPY WITH RETROGRADE PYELOGRAM;  Surgeon: Lawerence Pressman, MD;  Location: [facility];  Service: Urology;  Laterality: Bilateral;   CYSTOSCOPY W/ RETROGRADES  07/26/2020   Procedure: CYSTOSCOPY WITH RETROGRADE PYELOGRAM;  Surgeon: Lawerence Pressman, MD;  Location: [facility];  Service: Urology;;   CYSTOSCOPY W/ URETERAL STENT REMOVAL Right 07/26/2020   Procedure: CYSTOSCOPY WITH STENT REMOVAL;  Surgeon: Lawerence Pressman, MD;  Location: [facility];  Service: Urology;  Laterality: Right;   CYSTOSCOPY/URETEROSCOPY/HOLMIUM LASER/STENT PLACEMENT Bilateral 07/11/2020   Procedure: CYSTOSCOPY/URETEROSCOPY/HOLMIUM LASER/STENT PLACEMENT;  Surgeon: Lawerence Pressman, MD;   Location: [facility];  Service: Urology;  Laterality: Bilateral;   CYSTOSCOPY/URETEROSCOPY/HOLMIUM LASER/STENT PLACEMENT Left 07/26/2020   Procedure: CYSTOSCOPY/URETEROSCOPY/HOLMIUM LASER/STENT EXCHANGE;  Surgeon: Lawerence Pressman, MD;  Location: [facility];  Service: Urology;  Laterality: Left;   ESOPHAGOGASTRODUODENOSCOPY     2001, 2008, 2014   ESOPHAGOGASTRODUODENOSCOPY  11/12/2023   ESOPHAGOGASTRODUODENOSCOPY (EGD) WITH PROPOFOL  N/A 01/03/2018   Procedure: ESOPHAGOGASTRODUODENOSCOPY (EGD) WITH PROPOFOL ;  Surgeon: Cassie Click, MD;  Location: [facility] ENDOSCOPY;  Service: Endoscopy;  Laterality: N/A;   EXTRACORPOREAL SHOCK WAVE LITHOTRIPSY Left 07/11/2020   Procedure: EXTRACORPOREAL SHOCK WAVE LITHOTRIPSY (ESWL);  Surgeon: Lawerence Pressman, MD;  Location: [facility];  Service: Urology;  Laterality: Left;   IR NEPHROSTOMY PLACEMENT RIGHT  03/15/2018   KNEE ARTHROPLASTY Right 01/17/2024   Procedure: COMPUTER ASSISTED TOTAL KNEE ARTHROPLASTY;  Surgeon: Arlyne Lame, MD;  Location: [facility];  Service: Orthopedics;  Laterality: Right;   LITHOTRIPSY     LITHOTRIPSY     NEPHROLITHOTOMY Right 03/15/2018   Procedure: NEPHROLITHOTOMY PERCUTANEOUS;  Surgeon: Geraline Knapp, MD;  Location: [facility];  Service: Urology;  Laterality: Right;   STONE EXTRACTION WITH BASKET  07/11/2020   Procedure: BLADDER STONE EXTRACTION;  Surgeon: Lawerence Pressman, MD;  Location: [facility];  Service: Urology;;   UMBILICAL HERNIA REPAIR     UPPER GI ENDOSCOPY  2013    Social History  Tobacco Use   Smoking status: Never    Passive exposure: Never   Smokeless tobacco: Never  Vaping Use   Vaping status: Never Used  Substance Use Topics   Alcohol use: No    Alcohol/week: 0.0 standard drinks of alcohol   Drug use: No     Medication list has been reviewed and updated.  Current Meds  Medication Sig   acetaminophen  (TYLENOL ) 650 MG CR tablet Take 1,950 mg by mouth daily as needed.   aspirin  EC 81 MG tablet Take  1 tablet (81 mg total) by mouth in the morning and at bedtime.   benazepril  (LOTENSIN ) 10 MG tablet Take 1 tablet (10 mg total) by mouth daily.   celecoxib  (CELEBREX ) 200 MG capsule Take 1 capsule (200 mg total) by mouth 2 (two) times daily.   Dutasteride -Tamsulosin  HCl 0.5-0.4 MG CAPS Take 1 capsule by mouth daily.   EPINEPHrine  0.3 mg/0.3 mL IJ SOAJ injection Inject 0.3 mg into the muscle as needed for anaphylaxis.   Iron , Ferrous Sulfate , 325 (65 Fe) MG TABS Take 325 mg by mouth daily.   isosorbide  mononitrate (IMDUR ) 30 MG 24 hr tablet Take 30 mg by mouth daily.   latanoprost  (XALATAN ) 0.005 % ophthalmic solution Place 1 drop into both eyes at bedtime.   Melatonin 10 MG TABS Take 10 mg by mouth at bedtime.   metoprolol  succinate (TOPROL -XL) 50 MG 24 hr tablet Take 1 tablet (50 mg total) by mouth daily. TAKE WITH OR IMMEDIATELY FOLLOWING A MEAL.   Misc Natural Products (OSTEO BI-FLEX TRIPLE STRENGTH PO) Take 2 tablets by mouth daily.   nitroGLYCERIN  (NITROSTAT ) 0.4 MG SL tablet Place 1 tablet (0.4 mg total) under the tongue every 5 (five) minutes as needed for chest pain.   NON FORMULARY Pt uses a cpap nightly   pantoprazole  (PROTONIX ) 40 MG tablet TAKE 1 TABLET BY MOUTH ONCE DAILY   potassium chloride  (KLOR-CON  M) 10 MEQ tablet Take 1 tablet (10 mEq total) by mouth 2 (two) times daily.   rosuvastatin  (CRESTOR ) 5 MG tablet Take 5 mg by mouth daily.   tiZANidine  (ZANAFLEX ) 4 MG tablet Take 4 mg by mouth at bedtime as needed for muscle spasms.   torsemide  (DEMADEX ) 20 MG tablet Take 20 mg by mouth daily.   triamcinolone  (NASACORT ) 55 MCG/ACT AERO nasal inhaler Place 2 sprays into the nose daily as needed (allergies).       03/14/2024    2:10 PM 03/06/2024    9:58 AM 11/18/2023    3:25 PM 11/11/2023    2:43 PM  GAD 7 : Generalized Anxiety Score  Nervous, Anxious, on Edge 0 0 0 0  Control/stop worrying 0 0 0 0  Worry too much - different things 0 0 0 0  Trouble relaxing 0 0 0 0  Restless  0 0 0 0  Easily annoyed or irritable 0 0  0  Afraid - awful might happen 0 0 0 0  Total GAD 7 Score 0 0  0  Anxiety Difficulty   Not difficult at all Not difficult at all       03/14/2024    2:10 PM 03/06/2024    9:58 AM 11/18/2023    3:24 PM  Depression screen PHQ 2/9  Decreased Interest 0 0 1  Down, Depressed, Hopeless 0 1 1  PHQ - 2 Score 0 1 2  Altered sleeping   0  Tired, decreased energy   0  Change in appetite   0  Feeling  bad or failure about yourself    0  Trouble concentrating   0  Moving slowly or fidgety/restless   0  Suicidal thoughts   0  PHQ-9 Score   2  Difficult doing work/chores   Not difficult at all    BP Readings from Last 3 Encounters:  03/14/24 113/68  03/06/24 120/66  01/18/24 (!) 155/74    Physical Exam Vitals and nursing note reviewed.  Constitutional:      Appearance: He is well-developed.  HENT:     Head: Normocephalic and atraumatic.     Right Ear: Tympanic membrane, ear canal and external ear normal.     Left Ear: Tympanic membrane, ear canal and external ear normal.     Nose: Nose normal.     Mouth/Throat:     Dentition: Normal dentition.  Eyes:     General: Lids are normal. No scleral icterus.    Conjunctiva/sclera: Conjunctivae normal.     Pupils: Pupils are equal, round, and reactive to light.  Neck:     Thyroid : No thyromegaly.     Vascular: No carotid bruit, hepatojugular reflux or JVD.     Trachea: No tracheal deviation.  Cardiovascular:     Rate and Rhythm: Normal rate and regular rhythm.     Heart sounds: Normal heart sounds.  Pulmonary:     Effort: Pulmonary effort is normal.     Breath sounds: Normal breath sounds. No wheezing, rhonchi or rales.  Abdominal:     General: Bowel sounds are normal.     Palpations: Abdomen is soft. There is no hepatomegaly, splenomegaly or mass.     Tenderness: There is no abdominal tenderness.     Hernia: There is no hernia in the left inguinal area.  Musculoskeletal:        General:  Normal range of motion.     Cervical back: Normal range of motion and neck supple.  Lymphadenopathy:     Cervical: No cervical adenopathy.  Skin:    General: Skin is warm and dry.     Findings: No rash.  Neurological:     Mental Status: He is alert and oriented to person, place, and time.     Sensory: No sensory deficit.     Motor: No weakness.     Deep Tendon Reflexes: Reflexes are normal and symmetric.  Psychiatric:        Mood and Affect: Mood is not anxious or depressed.     Wt Readings from Last 3 Encounters:  03/14/24 197 lb 3.2 oz (89.4 kg)  03/06/24 191 lb 4.8 oz (86.8 kg)  01/10/24 200 lb (90.7 kg)    BP 113/68   Pulse 76   Resp 16   Ht 5\' 7"  (1.702 m)   Wt 197 lb 3.2 oz (89.4 kg)   SpO2 97%   BMI 30.89 kg/m   Assessment and Plan:  1. Obstructive sleep apnea on CPAP (Primary) Chronic.  Uncontrolled unless on CPAP and patient is controlled on CPAP.  Stable.  Review of Epworth sleepscale prior to CPAP patient was a 14.  Patient is utilizing CPAP on a nightly basis and has significant improvement of daytime somnolence and other symptomatology.  We will submit for new machine and necessary readings will be adjusted as needed.  2. Dependence on CPAP ventilation As noted above patient is dependent on this on a nightly basis and not only has it improved his daytime somnolence and other fatigability and symptomatology but is absolutely essential to  continue for his daily function.  3. Essential (primary) hypertension Chronic.  Controlled.  Stable.  Blood pressure 113/68.  Patient is doing well on benazepril  10 mg and metoprolol  XL 50 mg once a day.  Will check renal function panel for electrolytes since we made a change on potassium supplementation. - benazepril  (LOTENSIN ) 10 MG tablet; Take 1 tablet (10 mg total) by mouth daily.  Dispense: 90 tablet; Refill: 1 - metoprolol  succinate (TOPROL -XL) 50 MG 24 hr tablet; Take 1 tablet (50 mg total) by mouth daily. TAKE WITH OR  IMMEDIATELY FOLLOWING A MEAL.  Dispense: 90 tablet; Refill: 1 - Renal Function Panel  4. Barrett's esophagus without dysplasia Chronic.  Controlled.  Stable.  Patient has history of Barrett's esophagitis.  Currently is controlled on pantoprazole  40 mg once a day. - pantoprazole  (PROTONIX ) 40 MG tablet; Take 1 tablet (40 mg total) by mouth daily.  Dispense: 90 tablet; Refill: 1  5. Hypokalemia Chronic.  Controlled.  Stable.  Noted that potassium is low most likely due to the furosemide for which he is on per cardiology and/Dr. Vaughan George would.  We are supplementing potassium with 10 mEq a day and we will recheck potassium for supplementation sufficiency. - potassium chloride  (KLOR-CON  M) 10 MEQ tablet; Take 1 tablet (10 mEq total) by mouth 2 (two) times daily.  Dispense: 90 tablet; Refill: 1 - Renal Function Panel  6. Mixed hyperlipidemia Chronic.  Controlled.  Stable.  Patient has been given once again low-cholesterol low triglyceride dietary guidelines and we will continue rosuvastatin  5 mg once a day. - rosuvastatin  (CRESTOR ) 5 MG tablet; Take 1 tablet (5 mg total) by mouth daily.  Dispense: 90 tablet; Refill: 1  7. Other iron  deficiency anemia Pending hemoglobin and response to ferrous sulfate  will determine whether or not we continue the ferrous sulfate  we will review this with patient tomorrow morning after obtaining hemoglobin level today. - Hemoglobin     Alayne Allis, MD

## 2024-03-15 ENCOUNTER — Encounter: Payer: Self-pay | Admitting: Family Medicine

## 2024-03-15 LAB — RENAL FUNCTION PANEL
Albumin: 4 g/dL (ref 3.8–4.8)
BUN/Creatinine Ratio: 15 (ref 10–24)
BUN: 16 mg/dL (ref 8–27)
CO2: 26 mmol/L (ref 20–29)
Calcium: 9.3 mg/dL (ref 8.6–10.2)
Chloride: 105 mmol/L (ref 96–106)
Creatinine, Ser: 1.08 mg/dL (ref 0.76–1.27)
Glucose: 115 mg/dL — ABNORMAL HIGH (ref 70–99)
Phosphorus: 3.3 mg/dL (ref 2.8–4.1)
Potassium: 4.2 mmol/L (ref 3.5–5.2)
Sodium: 144 mmol/L (ref 134–144)
eGFR: 70 mL/min/{1.73_m2} (ref >59)

## 2024-03-15 LAB — HEMOGLOBIN: Hemoglobin: 14 g/dL (ref 13.0–17.7)

## 2024-03-22 ENCOUNTER — Other Ambulatory Visit: Payer: Self-pay

## 2024-03-22 DIAGNOSIS — N200 Calculus of kidney: Secondary | ICD-10-CM

## 2024-03-23 ENCOUNTER — Ambulatory Visit
Admission: RE | Admit: 2024-03-23 | Discharge: 2024-03-23 | Disposition: A | Discharge status: Home / Self Care | Attending: Urology | Admitting: Urology

## 2024-03-23 ENCOUNTER — Ambulatory Visit: Payer: Self-pay | Admitting: Physician Assistant

## 2024-03-23 ENCOUNTER — Ambulatory Visit
Admission: RE | Admit: 2024-03-23 | Discharge: 2024-03-23 | Disposition: A | Discharge status: Home / Self Care | Source: Ambulatory Visit | Attending: Urology | Admitting: Urology

## 2024-03-23 VITALS — BP 113/66 | HR 85 | Ht 67.0 in | Wt 192.1 lb

## 2024-03-23 DIAGNOSIS — N200 Calculus of kidney: Secondary | ICD-10-CM | POA: Diagnosis present

## 2024-03-23 DIAGNOSIS — N529 Male erectile dysfunction, unspecified: Secondary | ICD-10-CM | POA: Diagnosis not present

## 2024-03-23 DIAGNOSIS — N401 Enlarged prostate with lower urinary tract symptoms: Secondary | ICD-10-CM

## 2024-03-23 DIAGNOSIS — N138 Other obstructive and reflux uropathy: Secondary | ICD-10-CM | POA: Diagnosis not present

## 2024-03-23 LAB — BLADDER SCAN AMB NON-IMAGING

## 2024-03-23 NOTE — Progress Notes (Signed)
 03/23/2024 2:02 PM   Timothy Hatfield 1946-04-11 [id number]  CC: Chief Complaint  Patient presents with   Nephrolithiasis   Benign Prostatic Hypertrophy   HPI: Timothy Hatfield is a 78 y.o. male with PMH nephrolithiasis, BPH on dutasteride -tamsulosin , and ED who presents today for annual follow-up.   Today he reports no stone episodes within the past year, which he attributes to pushing ice water.  His urinary symptoms are stable and he is pleased.  He describes rare urge incontinence that he attributes to waiting too long to void.  His wife passed away recently, so he has not tried Cialis .  KUB today with stable right renal stone burden.  PVR 71 mL.  PMH: Past Medical History:  Diagnosis Date   Allergy    Anemia    Aortic atherosclerosis (HCC)    Barrett's esophagus    Bilateral lower extremity edema    BPH (benign prostatic hyperplasia)    Coronary artery disease involving native coronary artery of native heart 09/09/2016   a.) LHC 09/09/2016: 100% OM1, 70% OM2, 90% mLAD, 90% RI, 75% pRCA, 70% mRCA, 85% dRCA --> refer to CVTS; b.) s/p 4v CABG 10/02/2016   Degenerative arthritis of right knee 12/2023   Diastolic dysfunction    a.) TTE 07/17/2022:   Elevated RIGHT hemidiaphragm    Essential hypertension    GERD (gastroesophageal reflux disease)    History of kidney stones    History of right cataract extraction    Hyperlipidemia    Insomnia    a.) takes melatonin PRN   Long-term use of aspirin  therapy    Motion sickness    Obstructive sleep apnea on CPAP    Pneumonia    Pulmonary hypertension (HCC) 07/17/2022   a.) TTE 07/17/2022: PASP 40 mmHg   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   a.) LIMA-LAD, SVG-PDA, SVG-OM1, SVG-OM3   Stage 3a chronic kidney disease (CKD) (HCC)    Statin myopathy     Surgical History: Past Surgical History:  Procedure Laterality Date   CARDIAC CATHETERIZATION Left 09/09/2016   Procedure: Left Heart Cath and Coronary Angiography;  Surgeon:  Antonette Batters, MD;  Location: [facility]IVE [facility];  Service: Cardiovascular;  Laterality: Left;   CATARACT EXTRACTION W/ INTRAOCULAR LENS IMPLANT Right    COLONOSCOPY  2013   normal/ Dr Felicita Horns- cleared for 10 yrs   COLONOSCOPY  08/31/2007   COLONOSCOPY WITH PROPOFOL  N/A 01/03/2018   Procedure: COLONOSCOPY WITH PROPOFOL ;  Surgeon: Cassie Click, MD;  Location: [facility] ENDOSCOPY;  Service: Endoscopy;  Laterality: N/A;   CORONARY ARTERY BYPASS GRAFT N/A 10/02/2016   Procedure: CORONARY ARTERY BYPASS GRAFTING (4 vessels); Location: [facility]; Surgeon: Silver Dross, MD   CYSTOSCOPY W/ RETROGRADES Bilateral 07/11/2020   Procedure: CYSTOSCOPY WITH RETROGRADE PYELOGRAM;  Surgeon: Lawerence Pressman, MD;  Location: [facility] ORS;  Service: Urology;  Laterality: Bilateral;   CYSTOSCOPY W/ RETROGRADES  07/26/2020   Procedure: CYSTOSCOPY WITH RETROGRADE PYELOGRAM;  Surgeon: Lawerence Pressman, MD;  Location: [facility];  Service: Urology;;   CYSTOSCOPY W/ URETERAL STENT REMOVAL Right 07/26/2020   Procedure: CYSTOSCOPY WITH STENT REMOVAL;  Surgeon: Lawerence Pressman, MD;  Location: [facility];  Service: Urology;  Laterality: Right;   CYSTOSCOPY/URETEROSCOPY/HOLMIUM LASER/STENT PLACEMENT Bilateral 07/11/2020   Procedure: CYSTOSCOPY/URETEROSCOPY/HOLMIUM LASER/STENT PLACEMENT;  Surgeon: Lawerence Pressman, MD;  Location: [facility];  Service: Urology;  Laterality: Bilateral;   CYSTOSCOPY/URETEROSCOPY/HOLMIUM LASER/STENT PLACEMENT Left 07/26/2020   Procedure: CYSTOSCOPY/URETEROSCOPY/HOLMIUM LASER/STENT EXCHANGE;  Surgeon: Lawerence Pressman, MD;  Location: [facility];  Service: Urology;  Laterality: Left;   ESOPHAGOGASTRODUODENOSCOPY     2001, 2008, 2014   ESOPHAGOGASTRODUODENOSCOPY  11/12/2023   ESOPHAGOGASTRODUODENOSCOPY (EGD) WITH PROPOFOL  N/A 01/03/2018   Procedure: ESOPHAGOGASTRODUODENOSCOPY (EGD) WITH PROPOFOL ;  Surgeon: Cassie Click, MD;  Location: [facility] ENDOSCOPY;  Service: Endoscopy;  Laterality: N/A;   EXTRACORPOREAL  SHOCK WAVE LITHOTRIPSY Left 07/11/2020   Procedure: EXTRACORPOREAL SHOCK WAVE LITHOTRIPSY (ESWL);  Surgeon: Lawerence Pressman, MD;  Location: [facility];  Service: Urology;  Laterality: Left;   IR NEPHROSTOMY PLACEMENT RIGHT  03/15/2018   KNEE ARTHROPLASTY Right 01/17/2024   Procedure: COMPUTER ASSISTED TOTAL KNEE ARTHROPLASTY;  Surgeon: Arlyne Lame, MD;  Location: [facility];  Service: Orthopedics;  Laterality: Right;   LITHOTRIPSY     LITHOTRIPSY     NEPHROLITHOTOMY Right 03/15/2018   Procedure: NEPHROLITHOTOMY PERCUTANEOUS;  Surgeon: Geraline Knapp, MD;  Location: [facility];  Service: Urology;  Laterality: Right;   STONE EXTRACTION WITH BASKET  07/11/2020   Procedure: BLADDER STONE EXTRACTION;  Surgeon: Lawerence Pressman, MD;  Location: [facility];  Service: Urology;;   UMBILICAL HERNIA REPAIR     UPPER GI ENDOSCOPY  2013    Home Medications:  Allergies as of 03/23/2024       Reactions   Bee Venom Anaphylaxis   Ciprofloxacin  Itching        Medication List        Accurate as of Mar 23, 2024  2:02 PM. If you have any questions, ask your nurse or doctor.          acetaminophen  650 MG CR tablet Commonly known as: TYLENOL  Take 1,950 mg by mouth daily as needed.   aspirin  EC 81 MG tablet Take 1 tablet (81 mg total) by mouth in the morning and at bedtime.   benazepril  10 MG tablet Commonly known as: LOTENSIN  Take 1 tablet (10 mg total) by mouth daily.   celecoxib  200 MG capsule Commonly known as: CELEBREX  Take 1 capsule (200 mg total) by mouth 2 (two) times daily.   Dutasteride -Tamsulosin  HCl 0.5-0.4 MG Caps Take 1 capsule by mouth daily.   EPINEPHrine  0.3 mg/0.3 mL Soaj injection Commonly known as: EPI-PEN Inject 0.3 mg into the muscle as needed for anaphylaxis.   Iron  (Ferrous Sulfate ) 325 (65 Fe) MG Tabs Take 325 mg by mouth daily.   isosorbide  mononitrate 30 MG 24 hr tablet Commonly known as: IMDUR  Take 30 mg by mouth daily.   latanoprost  0.005 % ophthalmic  solution Commonly known as: XALATAN  Place 1 drop into both eyes at bedtime.   Melatonin 10 MG Tabs Take 10 mg by mouth at bedtime.   metoprolol  succinate 50 MG 24 hr tablet Commonly known as: TOPROL -XL Take 1 tablet (50 mg total) by mouth daily. TAKE WITH OR IMMEDIATELY FOLLOWING A MEAL.   nitroGLYCERIN  0.4 MG SL tablet Commonly known as: NITROSTAT  Place 1 tablet (0.4 mg total) under the tongue every 5 (five) minutes as needed for chest pain.   NON FORMULARY Pt uses a cpap nightly   OSTEO BI-FLEX TRIPLE STRENGTH PO Take 2 tablets by mouth daily.   oxyCODONE  5 MG immediate release tablet Commonly known as: Oxy IR/ROXICODONE  Take 1 tablet (5 mg total) by mouth every 4 (four) hours as needed for moderate pain (pain score 4-6) (pain score 4-6).   pantoprazole  40 MG tablet Commonly known as: PROTONIX  Take 1 tablet (40 mg total) by mouth daily.   potassium chloride  10 MEQ tablet Commonly known as:  KLOR-CON  M Take 1 tablet (10 mEq total) by mouth 2 (two) times daily.   rosuvastatin  5 MG tablet Commonly known as: CRESTOR  Take 1 tablet (5 mg total) by mouth daily.   tiZANidine  4 MG tablet Commonly known as: ZANAFLEX  Take 4 mg by mouth at bedtime as needed for muscle spasms.   torsemide  20 MG tablet Commonly known as: DEMADEX  Take 20 mg by mouth daily.   traMADol  50 MG tablet Commonly known as: ULTRAM  Take 1-2 tablets (50-100 mg total) by mouth every 4 (four) hours as needed for moderate pain (pain score 4-6).   triamcinolone  55 MCG/ACT Aero nasal inhaler Commonly known as: NASACORT  Place 2 sprays into the nose daily as needed (allergies).        Allergies:  Allergies  Allergen Reactions   Bee Venom Anaphylaxis   Ciprofloxacin  Itching    Family History: Family History  Problem Relation Age of Onset   Arrhythmia Mother    Coronary artery disease Father    Diabetes Father    Hyperlipidemia Brother     Social History:   reports that he has never smoked. He  has never been exposed to tobacco smoke. He has never used smokeless tobacco. He reports that he does not drink alcohol and does not use drugs.  Physical Exam: BP 113/66 (BP Location: Left Arm, Patient Position: Sitting, Cuff Size: Normal)   Pulse 85   Ht 5\' 7"  (1.702 m)   Wt 192 lb 1.6 oz (87.1 kg)   BMI 30.09 kg/m   Constitutional:  Alert and oriented, no acute distress, nontoxic appearing HEENT: , AT Cardiovascular: No clubbing, cyanosis, or edema Respiratory: Normal respiratory effort, no increased work of breathing Skin: No rashes, bruises or suspicious lesions Neurologic: Grossly intact, no focal deficits, moving all 4 extremities Psychiatric: Normal mood and affect  Laboratory Data: Results for orders placed or performed in visit on 03/23/24  Bladder Scan (Post Void Residual) in office   Collection Time: 03/23/24  1:38 PM  Result Value Ref Range   Scan Result 71mL    Assessment & Plan:   1. BPH with obstruction/lower urinary tract symptoms (Primary) Symptoms well-controlled on combination dutasteride  and tamsulosin  and he is emptying appropriately.  Will continue this. - Bladder Scan (Post Void Residual) in office  2. Nephrolithiasis Stone burden appears stable, continue pushing fluids, will continue to monitor. - Abdomen 1 view (KUB); Future  3. Erectile dysfunction, unspecified erectile dysfunction type May reconsider PDE 5 inhibitors in the future per patient desire.  Return in about 1 year (around 03/23/2025) for IPSS/PVR/KUB prior.  Kathreen Pare, PA-C  [facility] [place] [address], [address] [place], [place] [address] [phone]

## 2024-04-06 ENCOUNTER — Encounter

## 2024-06-03 ENCOUNTER — Other Ambulatory Visit: Payer: Self-pay | Admitting: Urology

## 2024-07-10 ENCOUNTER — Ambulatory Visit: Payer: Self-pay

## 2024-07-10 ENCOUNTER — Telehealth: Payer: Self-pay | Admitting: Family Medicine

## 2024-07-10 NOTE — Telephone Encounter (Signed)
 Patient called to check on A1C. I do not see any labs with A1C. I told him his glucose numbers. He thanked me and got off the phone.  - CM

## 2024-07-10 NOTE — Telephone Encounter (Signed)
 Patient was calling to see what his A1c levels were. While on the phone with this RN, the office had attempted to call the patient about this so this RN called the CAL to see about getting the patient back with staff. This RN did not see where a recent A1c was performed. Patient was connected with Jon who had called him. She assisted the patient at this time.   FYI Only or Action Required?: FYI only for provider.  Patient was last seen in primary care on 03/14/2024 by Joshua Cathryne BROCKS, MD.  Called Nurse Triage reporting Lab Results Questions.   Triage Disposition: Call PCP When Office is Open  Patient/caregiver understands and will follow disposition?: Yes--Patient transferred to pcp office who assisted patient further                Copied from CRM #[id number]. Topic: Clinical - Lab/Test Results >> Jul 10, 2024 10:41 AM Timothy Hatfield wrote: Reason for CRM: Patient is calling in because he has questions about his A1C1 levels. And also want his information sent over to another facility. Reason for Disposition  [1] Caller requesting NON-URGENT health information AND [2] PCP's office is the best resource  Answer Assessment - Initial Assessment Questions Patient was calling to see what his A1c levels were. While on the phone with this RN, the office had attempted to call the patient about this so this RN called the CAL to see about getting the patient back with staff. This RN did not see where a recent A1c was performed. Patient was connected with Jon who had called him. She assisted the patient at this time.  Protocols used: Information Only Call - No Triage-A-AH

## 2024-07-10 NOTE — Telephone Encounter (Unsigned)
 Copied from CRM [id number]. Topic: Clinical - Lab/Test Results >> Jul 10, 2024 10:41 AM Gennette ORN wrote: Reason for CRM: Patient is calling in because he has questions about his A1C1 levels. And also want his information sent over to another facility.

## 2024-07-13 NOTE — Telephone Encounter (Signed)
 Copied from CRM [id number]. Topic: Clinical - Lab/Test Results >> Jul 10, 2024 10:41 AM Gennette ORN wrote: Reason for CRM: Patient is calling in because he has questions about his A1C1 levels. And also want his information sent over to another facility. >> Jul 10, 2024  1:08 PM Gustabo D wrote: Go over labs a1c

## 2024-07-26 ENCOUNTER — Encounter: Payer: Self-pay | Admitting: Urology

## 2025-03-28 ENCOUNTER — Ambulatory Visit: Admitting: Urology

## 2025-03-29 ENCOUNTER — Ambulatory Visit: Admitting: Urology
